# Patient Record
Sex: Male | Born: 1935 | Race: White | Hispanic: No | Smoking: Former smoker
Health system: Southern US, Community
[De-identification: ages and names within clinical notes are randomized; demographics above are authoritative.]

## PROBLEM LIST (undated history)

## (undated) DIAGNOSIS — H3530 Unspecified macular degeneration: Secondary | ICD-10-CM

## (undated) DIAGNOSIS — Z950 Presence of cardiac pacemaker: Secondary | ICD-10-CM

## (undated) DIAGNOSIS — G4733 Obstructive sleep apnea (adult) (pediatric): Secondary | ICD-10-CM

## (undated) DIAGNOSIS — K274 Chronic or unspecified peptic ulcer, site unspecified, with hemorrhage: Secondary | ICD-10-CM

## (undated) DIAGNOSIS — E538 Deficiency of other specified B group vitamins: Secondary | ICD-10-CM

## (undated) DIAGNOSIS — I480 Paroxysmal atrial fibrillation: Secondary | ICD-10-CM

## (undated) DIAGNOSIS — R269 Unspecified abnormalities of gait and mobility: Secondary | ICD-10-CM

## (undated) DIAGNOSIS — K2970 Gastritis, unspecified, without bleeding: Secondary | ICD-10-CM

## (undated) DIAGNOSIS — G629 Polyneuropathy, unspecified: Secondary | ICD-10-CM

## (undated) DIAGNOSIS — I499 Cardiac arrhythmia, unspecified: Secondary | ICD-10-CM

## (undated) DIAGNOSIS — M1990 Unspecified osteoarthritis, unspecified site: Secondary | ICD-10-CM

## (undated) DIAGNOSIS — K284 Chronic or unspecified gastrojejunal ulcer with hemorrhage: Secondary | ICD-10-CM

## (undated) DIAGNOSIS — N1330 Unspecified hydronephrosis: Secondary | ICD-10-CM

## (undated) DIAGNOSIS — R413 Other amnesia: Secondary | ICD-10-CM

## (undated) DIAGNOSIS — I8390 Asymptomatic varicose veins of unspecified lower extremity: Secondary | ICD-10-CM

## (undated) DIAGNOSIS — N210 Calculus in bladder: Secondary | ICD-10-CM

## (undated) DIAGNOSIS — I495 Sick sinus syndrome: Secondary | ICD-10-CM

## (undated) DIAGNOSIS — S2249XA Multiple fractures of ribs, unspecified side, initial encounter for closed fracture: Secondary | ICD-10-CM

## (undated) DIAGNOSIS — G250 Essential tremor: Secondary | ICD-10-CM

## (undated) DIAGNOSIS — I10 Essential (primary) hypertension: Secondary | ICD-10-CM

## (undated) HISTORY — DX: Obstructive sleep apnea (adult) (pediatric): G47.33

## (undated) HISTORY — DX: Deficiency of other specified B group vitamins: E53.8

## (undated) HISTORY — DX: Chronic or unspecified peptic ulcer, site unspecified, with hemorrhage: K27.4

## (undated) HISTORY — PX: COLON SURGERY: SHX602

## (undated) HISTORY — DX: Unspecified macular degeneration: H35.30

## (undated) HISTORY — DX: Other amnesia: R41.3

## (undated) HISTORY — PX: INSERT / REPLACE / REMOVE PACEMAKER: SUR710

## (undated) HISTORY — DX: Chronic or unspecified gastrojejunal ulcer with hemorrhage: K28.4

## (undated) HISTORY — PX: BRAIN SURGERY: SHX531

## (undated) HISTORY — DX: Unspecified abnormalities of gait and mobility: R26.9

## (undated) HISTORY — DX: Asymptomatic varicose veins of unspecified lower extremity: I83.90

---

## 1990-06-17 HISTORY — PX: OTHER SURGICAL HISTORY: SHX169

## 1998-09-11 ENCOUNTER — Ambulatory Visit (HOSPITAL_BASED_OUTPATIENT_CLINIC_OR_DEPARTMENT_OTHER): Admission: RE | Admit: 1998-09-11 | Discharge: 1998-09-11 | Payer: Self-pay | Admitting: Urology

## 2001-01-24 ENCOUNTER — Emergency Department (HOSPITAL_COMMUNITY): Admission: EM | Admit: 2001-01-24 | Discharge: 2001-01-24 | Payer: Self-pay | Admitting: Emergency Medicine

## 2001-11-10 ENCOUNTER — Ambulatory Visit: Admission: RE | Admit: 2001-11-10 | Discharge: 2001-11-10 | Payer: Self-pay | Admitting: Family Medicine

## 2002-03-07 ENCOUNTER — Emergency Department (HOSPITAL_COMMUNITY): Admission: EM | Admit: 2002-03-07 | Discharge: 2002-03-07 | Payer: Self-pay | Admitting: Emergency Medicine

## 2002-07-07 ENCOUNTER — Encounter: Payer: Self-pay | Admitting: Family Medicine

## 2002-07-07 ENCOUNTER — Ambulatory Visit (HOSPITAL_COMMUNITY): Admission: RE | Admit: 2002-07-07 | Discharge: 2002-07-07 | Payer: Self-pay | Admitting: Family Medicine

## 2002-08-16 ENCOUNTER — Encounter: Payer: Self-pay | Admitting: Family Medicine

## 2002-08-16 ENCOUNTER — Ambulatory Visit (HOSPITAL_COMMUNITY): Admission: RE | Admit: 2002-08-16 | Discharge: 2002-08-16 | Payer: Self-pay | Admitting: Family Medicine

## 2002-11-25 ENCOUNTER — Ambulatory Visit (HOSPITAL_COMMUNITY): Admission: RE | Admit: 2002-11-25 | Discharge: 2002-11-25 | Payer: Self-pay | Admitting: *Deleted

## 2003-05-02 ENCOUNTER — Ambulatory Visit (HOSPITAL_COMMUNITY): Admission: RE | Admit: 2003-05-02 | Discharge: 2003-05-02 | Payer: Self-pay | Admitting: Cardiovascular Disease

## 2003-05-09 ENCOUNTER — Other Ambulatory Visit: Admission: RE | Admit: 2003-05-09 | Discharge: 2003-05-09 | Payer: Self-pay | Admitting: Dermatology

## 2004-06-17 HISTORY — PX: PACEMAKER INSERTION: SHX728

## 2004-12-26 ENCOUNTER — Ambulatory Visit (HOSPITAL_COMMUNITY): Admission: RE | Admit: 2004-12-26 | Discharge: 2004-12-26 | Payer: Self-pay | Admitting: Family Medicine

## 2005-01-28 ENCOUNTER — Inpatient Hospital Stay (HOSPITAL_COMMUNITY): Admission: AD | Admit: 2005-01-28 | Discharge: 2005-01-30 | Payer: Self-pay | Admitting: *Deleted

## 2005-03-14 ENCOUNTER — Other Ambulatory Visit: Admission: RE | Admit: 2005-03-14 | Discharge: 2005-03-14 | Payer: Self-pay | Admitting: Dermatology

## 2005-06-05 ENCOUNTER — Ambulatory Visit (HOSPITAL_COMMUNITY): Admission: RE | Admit: 2005-06-05 | Discharge: 2005-06-05 | Payer: Self-pay | Admitting: Family Medicine

## 2006-04-16 ENCOUNTER — Ambulatory Visit (HOSPITAL_COMMUNITY): Admission: RE | Admit: 2006-04-16 | Discharge: 2006-04-16 | Payer: Self-pay | Admitting: Family Medicine

## 2006-05-06 ENCOUNTER — Ambulatory Visit (HOSPITAL_COMMUNITY): Admission: RE | Admit: 2006-05-06 | Discharge: 2006-05-06 | Payer: Self-pay | Admitting: Family Medicine

## 2006-05-15 ENCOUNTER — Ambulatory Visit (HOSPITAL_COMMUNITY): Admission: RE | Admit: 2006-05-15 | Discharge: 2006-05-15 | Payer: Self-pay | Admitting: Family Medicine

## 2007-03-27 ENCOUNTER — Ambulatory Visit (HOSPITAL_COMMUNITY): Admission: RE | Admit: 2007-03-27 | Discharge: 2007-03-27 | Payer: Self-pay | Admitting: Neurology

## 2008-05-07 ENCOUNTER — Emergency Department (HOSPITAL_COMMUNITY): Admission: EM | Admit: 2008-05-07 | Discharge: 2008-05-07 | Payer: Self-pay | Admitting: Emergency Medicine

## 2008-08-05 ENCOUNTER — Ambulatory Visit (HOSPITAL_COMMUNITY): Admission: RE | Admit: 2008-08-05 | Discharge: 2008-08-05 | Payer: Self-pay | Admitting: Cardiology

## 2008-11-26 ENCOUNTER — Emergency Department (HOSPITAL_COMMUNITY): Admission: EM | Admit: 2008-11-26 | Discharge: 2008-11-26 | Payer: Self-pay | Admitting: Emergency Medicine

## 2008-12-02 ENCOUNTER — Emergency Department (HOSPITAL_COMMUNITY): Admission: EM | Admit: 2008-12-02 | Discharge: 2008-12-02 | Payer: Self-pay | Admitting: Emergency Medicine

## 2009-03-02 ENCOUNTER — Emergency Department (HOSPITAL_COMMUNITY): Admission: EM | Admit: 2009-03-02 | Discharge: 2009-03-02 | Payer: Self-pay | Admitting: Emergency Medicine

## 2009-08-22 ENCOUNTER — Ambulatory Visit (HOSPITAL_COMMUNITY): Admission: RE | Admit: 2009-08-22 | Discharge: 2009-08-22 | Payer: Self-pay | Admitting: Family Medicine

## 2010-11-02 NOTE — Discharge Summary (Signed)
NAME:  Jon [name], Jon [name] NO.:  [id number]   MEDICAL RECORD NO.:  [id number]          PATIENT TYPE:  INP   LOCATION:  2019                         FACILITY:  [facility]   PHYSICIAN:  Darlin Priestly, MD  DATE OF BIRTH:  [birth year]   DATE OF ADMISSION:  01/28/2005  DATE OF DISCHARGE:  01/30/2005                                 DISCHARGE SUMMARY   DISCHARGE DIAGNOSES:  1.  Symptomatic bradycardia, status post permanent pacemaker implantation      with Medtronic in-rhythm, P1501DR, serial [id number].  2.  Paroxysmal atrial fibrillation.  3.  Tachy-brady syndrome.   DISCHARGE CONDITION:  Improved.   PROCEDURES:  On January 29, 2005, placement of dual-chamber permanent  pacemaker by Dr. Lenise Herald.   DISCHARGE MEDICATIONS:  1.  Coumadin 2.5 mg daily, begin January 31, 2005.  2.  Diovan 80 mg daily.  3.  Amiodarone 200 mg one-half daily.  4.  AcipHex 20 mg one daily.  5.  Zocor 20 mg every evening.  6.  Furosemide 40 mg p.r.n.  7.  Potassium 20 mEq p.r.n. to take with Lasix.   DISCHARGE INSTRUCTIONS:  1.  Low-fat, low-salt diet.  2.  Increase activity slowly, see pacemaker instruction sheet.  3.  Follow up with Dr. Jenne Campus on Friday, February 08, 2005, at 10 a.m.  4.  Have blood drawn on Monday morning of the week of discharge to check      INR.   HISTORY OF PRESENT ILLNESS:  A 77 year old obese male with history of PAF  which has been effectively treated with amiodarone and a longstanding  history of bradycardia. Dr. Allyson Sabal has attempted to modify his symptoms by  discontinuing his Cardizem to dose modify his amiodarone therapy. The  patient continued to complain of fatigue, exercise intolerance, easy  fatigability, interferes with lifestyle and ability to perform activities of  daily living. Even his usual walks have been something he cannot do or enjoy  secondary to fatigue or mild shortness of breath. He does not have syncope  or presyncope, just merely  fatigue. It had increased over the prior weeks  prior to seeing Dr. Severiano Gilbert, EP physician, in consult in July.   PAST MEDICAL HISTORY:  1.  Atherosclerotic coronary disease.  2.  Paroxysmal atrial fibrillation.  3.  Dyslipidemia.  4.  Essential hypertension.   For family history, social history, and review of systems, see H&P.   PHYSICAL EXAMINATION:  VITAL SIGNS: At discharge, blood pressure 132/84,  pulse 62, respirations 20, temperature 96.7.  GENERAL: Alert and oriented white male.  HEART: Regular rate and rhythm. S1 and S2.  CHEST: Clear to auscultation bilaterally. Pacing site without hematoma and  Steri-Strips are in place.  EXTREMITIES: Without edema.   LABORATORY DATA:  Hemoglobin 13.7 after procedure. Hematocrit 40, ____,  platelet count 220,000. Pro time on admission was 24 with an INR of 2.2 and  then INR dropped to 1.9. Prior to discharge it was 1.5. Sodium was 140,  potassium 4.0, chloride 107, CO2 28, glucose 123, BUN 15, creatinine 1.4,  calcium 8.4, total protein 12.5, albumin 3.4, AST 21,  ALT 16, ALP 56, total  bilirubin 0.7. Total cholesterol 168, triglycerides 194, HDL 33, LDL 96.   EKG on August 16th was normal sinus rhythm with no previous change from  previous tracing. Chest x-ray on August 14th showed no evidence of acute  disease.   Follow-up chest x-ray revealed left-sided pacemaker, no pneumo, no active  disease.   HOSPITAL COURSE:  Mr. Dinkins was admitted on January 28, 2005, for  Coumadin/heparin crossover for permanent pacemaker. His INR was not yet less  than 2. No heparin was started and the patient drifted downward. By the  morning of January 29, 2005, he was stable and ready for pacemaker insertion.  This was done by Dr. Jenne Campus. The patient tolerated the procedure well.   On January 30, 2005, pacer was interrogated, normal function, no changes were  done, and the patient was felt stable to be discharged home. He was  ambulating without problems  with follow up with Dr. Jenne Campus.      Darcella Gasman. Valarie Merino      Darlin Priestly, MD  Electronically Signed    LRI/MEDQ  D:  03/24/2005  T:  03/24/2005  Job:  [id number]   cc:   Nanetta Batty, M.D.  Fax: [phone]   Mila Homer. Sudie Bailey, M.D.  Fax: [phone]

## 2010-11-02 NOTE — Op Note (Signed)
NAME:  THERRON, SELLS NO.:  [id number]   MEDICAL RECORD NO.:  [id number]          PATIENT TYPE:  INP   LOCATION:  2019                         FACILITY:  [facility]   PHYSICIAN:  Darlin Priestly, MD  DATE OF BIRTH:  10-26-1931   DATE OF PROCEDURE:  01/29/2005  DATE OF DISCHARGE:                                 OPERATIVE REPORT   PROCEDURE:  Insertion of a Medtronic EnRhythm pacemaker with passive atrial  and ventricular leads.   ATTENDING:  Dr. Lenise Herald.   COMPLICATIONS:  None.   INDICATION:  Mr. Stonehocker is a 77 year old male, a patient of Dr. Nanetta Batty, with a history of paroxysmal atrial fibrillation, on amiodarone  therapy, with a history of sick sinus syndrome and intermittent bradycardia.  Because of his PAF and difficulty controlling the heart rate, he is now  referred for dual chamber pacer implant.   DESCRIPTION OF OPERATION:  After giving informed written consent, the  patient was brought to the Cardiac Cath Lab where his left anterior chest  wall was shaved, prepped and draped in sterile fashion.  The ECD monitor  established.  Lidocaine 1% was then used to anesthetize the left subclavian  region in the midclavicular line.  Next, approximately a 3 cm horizontal mid  infraclavicular incision was performed and hemostasis was obtained with  electrocautery.  Blunt dissection was used to carry this down to the left  pectoral fascia.  Next, an approximately 3 x 4 pocket was then created over  the left pectoral fascia and again hemostasis was obtained with  electrocautery.  The left subclavian vein was then entered using fluoro  visualization and a guide wire was then easily passed into the SVC and right  atrium.  Four silk sutures were then placed at the base of the subclavian  stick for lead anchoring.  Next, a #9 [place] dilator sheath was then easily  tracked over the retained guide wire and the dilator and guide wire were  removed.   Following this, a 58 cm passive ventricular Medtronic lead, Model  #4092, Serial [id number], was then easily tracked into the right atrium.  The guide wire was retained and the peel-away sheath was removed.  A second  9 French dilator and sheath were then tracked over the retained guide wire  and the guide wire and dilator were removed.  A 53 cm passive Medtronic  lead, Model #5594, Serial [id number], was then easily passed into the right  atrium, the guide wire was retained, the sheath was then peeled away.  The  guide wire was then passed into the sheath.  A __________ was then placed on  the ventricular lead stylet and the ventricular lead was allowed to prolapse  through the tricuspid valve and position in the RV apex and thresholds were  determined.  RV waves were measured at 9.5 millivolts.  The impedance was  557 ohms.  Threshold was .6 volts at .5 milliseconds.  Current was 1.4  milliamps.  The atrial lead stylet was then pulled back and the atrial lead  was then positioned in the right atrial appendage.  Threshold was then  determined.  P waves were measured at 6.9 millivolts.  Threshold in the  atrium was .6 volts at .5 milliseconds.  Impedance was 473 ohms.  Current  was 1.5 milliamps.  Ten volts was negative in both the A and the V lead.  The silk sutures were then secured to both leads.  The pocket was then  copiously irrigated with 1% kanamycin solution.  Again, hemostasis was  confirmed.  The leads were then connected in a serial fashion to a Medtronic  EnRhythm P1501DR generator, Serial [id number].  Head screws were tightened  and pacing was confirmed.  A single silk suture was then placed in the apex  of the pocket.  The leads and generator were then delivered into the pocket  and the header was then secured to the silk suture.  The pocket subcutaneous  layer was then closed using running 2-0 Vicryl.  The skin layer was then  closed using 4-0 Vicryl.  Steri-Strip was then  applied.  The patient was  transferred to the recovery room in stable condition.   CONCLUSIONS:  Successful implant of a Medtronic EnRhythm [id number] generator,  Serial [id number] with passive atrial and ventricular leads.      Darlin Priestly, MD  Electronically Signed     RHM/MEDQ  D:  01/29/2005  T:  01/29/2005  Job:  [id number]   cc:   Nanetta Batty, M.D.  Fax: [phone]

## 2010-11-02 NOTE — Procedures (Signed)
NAME:  Jon [name], Jon [name] NO.:  [id number]   MEDICAL RECORD NO.:  [id number]          PATIENT TYPE:  OUT   LOCATION:  RESP                          FACILITY:  [facility]   PHYSICIAN:  Edward L. Juanetta Gosling, M.D.DATE OF BIRTH:  [birth year]   DATE OF PROCEDURE:  DATE OF DISCHARGE:  05/15/2006                            PULMONARY FUNCTION TEST   FINDINGS:  1. Spirometry shows minimal airflow obstruction at the level of the      smaller airways which is consistent with a history of smoking.  2. Lung volumes are normal.  3. DLCO is normal.      Edward L. Juanetta Gosling, M.D.  Electronically Signed     ELH/MEDQ  D:  05/18/2006  T:  05/19/2006  Job:  [id number]   cc:   Mila Homer. Sudie Bailey, M.D.  Fax: [phone]

## 2010-11-02 NOTE — Procedures (Signed)
NAME:  Jon [name], Jon [name]                      ACCOUNT NO.:  [id number]   MEDICAL RECORD NO.:  [id number]                   PATIENT TYPE:  OUT   LOCATION:  RAD                                  FACILITY:  [facility]   PHYSICIAN:  Edward L. Juanetta Gosling, M.D.             DATE OF BIRTH:  05/07/1932   DATE OF PROCEDURE:  DATE OF DISCHARGE:  05/02/2003                              PULMONARY FUNCTION TEST   FINDINGS:  1. Spirometry shows a mild ventilatory defect without definite air-flow     obstruction.  2. Lung volumes are slightly decreased the same order of magnitude as his     ventilatory defect.  3. DLCO also slightly decreased at about the same magnitude as the     ventilatory defect.      ___________________________________________                                            Oneal Deputy. Juanetta Gosling, M.D.   ELH/MEDQ  D:  05/09/2003  T:  05/10/2003  Job:  [id number]

## 2011-06-18 HISTORY — PX: CATARACT EXTRACTION: SUR2

## 2012-08-05 ENCOUNTER — Other Ambulatory Visit: Payer: Self-pay | Admitting: Cardiovascular Disease

## 2012-08-05 ENCOUNTER — Encounter (HOSPITAL_COMMUNITY): Payer: Self-pay | Admitting: Pharmacist

## 2012-08-12 MED ORDER — DEXTROSE 5 % IV SOLN
3.0000 g | INTRAVENOUS | Status: AC
  Filled 2012-08-12: qty 3000

## 2012-08-12 MED ORDER — SODIUM CHLORIDE 0.9 % IV SOLN
250.0000 mL | INTRAVENOUS | Status: DC
  Administered 2012-08-13: 12:00:00 via INTRAVENOUS

## 2012-08-12 MED ORDER — SODIUM CHLORIDE 0.9 % IR SOLN
80.0000 mg | Status: AC
  Filled 2012-08-12: qty 2

## 2012-08-12 MED ORDER — SODIUM CHLORIDE 0.45 % IV SOLN: INTRAVENOUS | Status: DC

## 2012-08-13 ENCOUNTER — Encounter (HOSPITAL_COMMUNITY)
Admission: RE | Disposition: A | Discharge status: Home / Self Care | Payer: Self-pay | Source: Ambulatory Visit | Attending: Cardiovascular Disease

## 2012-08-13 ENCOUNTER — Ambulatory Visit (HOSPITAL_COMMUNITY): Payer: Medicare Other

## 2012-08-13 ENCOUNTER — Ambulatory Visit (HOSPITAL_COMMUNITY)
Admission: RE | Admit: 2012-08-13 | Discharge: 2012-08-13 | Disposition: A | Discharge status: Home / Self Care | Payer: Medicare Other | Source: Ambulatory Visit | Attending: Cardiovascular Disease | Admitting: Cardiovascular Disease

## 2012-08-13 DIAGNOSIS — Z45018 Encounter for adjustment and management of other part of cardiac pacemaker: Secondary | ICD-10-CM | POA: Insufficient documentation

## 2012-08-13 HISTORY — PX: PERMANENT PACEMAKER GENERATOR CHANGE: SHX6022

## 2012-08-13 LAB — SURGICAL PCR SCREEN
MRSA, PCR: NEGATIVE
Staphylococcus aureus: NEGATIVE

## 2012-08-13 LAB — PROTIME-INR: Prothrombin Time: 21.3 seconds — ABNORMAL HIGH (ref 11.6–15.2)

## 2012-08-13 SURGERY — PERMANENT PACEMAKER GENERATOR CHANGE
Anesthesia: LOCAL

## 2012-08-13 MED ORDER — MUPIROCIN 2 % EX OINT
TOPICAL_OINTMENT | Freq: Two times a day (BID) | CUTANEOUS | Status: DC
  Filled 2012-08-13: qty 22

## 2012-08-13 MED ORDER — MUPIROCIN 2 % EX OINT
TOPICAL_OINTMENT | CUTANEOUS | Status: AC
  Filled 2012-08-13: qty 22

## 2012-08-13 MED ORDER — MIDAZOLAM HCL 2 MG/2ML IJ SOLN
INTRAMUSCULAR | Status: AC
  Filled 2012-08-13: qty 2

## 2012-08-13 MED ORDER — SODIUM CHLORIDE 0.9 % IV SOLN: INTRAVENOUS | Status: DC

## 2012-08-13 MED ORDER — FENTANYL CITRATE 0.05 MG/ML IJ SOLN
INTRAMUSCULAR | Status: AC
  Filled 2012-08-13: qty 2

## 2012-08-13 MED ORDER — ONDANSETRON HCL 4 MG/2ML IJ SOLN: 4.0000 mg | Freq: Four times a day (QID) | INTRAMUSCULAR | Status: DC | PRN

## 2012-08-13 MED ORDER — LIDOCAINE HCL (PF) 1 % IJ SOLN
INTRAMUSCULAR | Status: AC
  Filled 2012-08-13: qty 60

## 2012-08-13 MED ORDER — ACETAMINOPHEN 325 MG PO TABS
325.0000 mg | ORAL_TABLET | ORAL | Status: DC | PRN
  Filled 2012-08-13: qty 2

## 2012-08-13 NOTE — H&P (Signed)
Date of Initial H&P:   History reviewed, patient examined, no change in status, stable for surgery. Pacemaker generator at [facility], here for generator change. This procedure has been fully reviewed with the patient and written informed consent has been obtained. Thurmon Fair, MD, [name] and [facility] Center [phone] office [phone] pager

## 2012-08-13 NOTE — CV Procedure (Signed)
Jon [name], Robling Male, 79 y.o., [birth year]  MRN: [id number]  CSN: [id number]  Admit Dt: 08/13/12  Procedure report  Procedure performed:  1. Dual chamber pacemaker generator changeout  2. Light sedation  Reason for procedure:  1. Device generator at elective replacement interval  Procedure performed by:  Thurmon Fair, MD  Complications:  None  Estimated blood loss:  <5 mL  Medications administered during procedure:  Ancef 3 g intravenously,  lidocaine 1% 30 mL locally, fentanyl 25 mcg intravenously, Versed 1 mg intravenously Device details:   New Generator Medtronic Adapta model number ADDRL1, serial number [id number] Right atrial lead (chronic) Medtronic, model number G8843662, serial [id number] (implanted 01/29/2005) Right ventricular lead (chronic)  Medtronic, model number [id number], serial number [id number] (implanted 01/30/2015)  Explanted generator Medtronic EnRhythm ,  model number P1501DR, serial number  [id number] (implanted 01/30/2015)  Procedure details:  After the risks and benefits of the procedure were discussed the patient provided informed consent. She was brought to the cardiac catheter lab in the fasting state. The patient was prepped and draped in usual sterile fashion. Local anesthesia with 1% lidocaine was administered to to the left infraclavicular area. A 5-6cm horizontal incision was made parallel with and 2-3 cm caudal to the left clavicle, in the area of an old scar.  Using minimal electrocautery and mostly sharp and blunt dissection the prepectoral pocket was opened carefully to avoid injury to the loops of chronic leads. Extensive dissection was not necessary. The device was explanted. The pocket was carefully inspected for hemostasis and flushed with copious amounts of antibiotic solution.  The leads were disconnected from the old generator and testing of the lead parameters later showed excellent values. The new generator was connected to the chronic  leads, with appropriate pacing noted.   The entire system was then carefully inserted in the pocket with care been taking that the leads and device assumed a comfortable position without pressure on the incision. Great care was taken that the leads be located deep to the generator. The pocket was then closed in layers using 2 layers of 2-0 Vicryl and cutaneous staples after which a sterile dressing was applied.   At the end of the procedure the following lead parameters were encountered:   Right atrial lead sensed P waves 5.2 mV, impedance 517 ohms, threshold 1.7 at 0.5 ms pulse width.  Right ventricular lead sensed R waves  8.8 mV, impedance 360 ohms, threshold 0.8 at 0.5 ms pulse width.   Thurmon Fair, MD, [facility] and Vascular Center [phone] office [phone] pager 08/13/2012 3:10 PM

## 2012-09-07 ENCOUNTER — Ambulatory Visit: Payer: Self-pay | Admitting: Cardiovascular Disease

## 2012-09-07 DIAGNOSIS — Z7901 Long term (current) use of anticoagulants: Secondary | ICD-10-CM | POA: Insufficient documentation

## 2012-09-07 DIAGNOSIS — I4891 Unspecified atrial fibrillation: Secondary | ICD-10-CM

## 2012-10-05 ENCOUNTER — Encounter: Payer: Self-pay | Admitting: *Deleted

## 2012-11-06 ENCOUNTER — Other Ambulatory Visit: Payer: Self-pay | Admitting: Cardiovascular Disease

## 2012-11-06 LAB — PROTIME-INR: INR: 2.37 — ABNORMAL HIGH (ref <1.50)

## 2012-11-10 ENCOUNTER — Ambulatory Visit (INDEPENDENT_AMBULATORY_CARE_PROVIDER_SITE_OTHER): Payer: Self-pay | Admitting: Pharmacist Clinician (PhC)/ Clinical Pharmacy Specialist

## 2012-11-10 DIAGNOSIS — Z7901 Long term (current) use of anticoagulants: Secondary | ICD-10-CM

## 2012-11-10 DIAGNOSIS — I4891 Unspecified atrial fibrillation: Secondary | ICD-10-CM

## 2012-11-19 ENCOUNTER — Other Ambulatory Visit: Payer: Self-pay | Admitting: Pharmacist Clinician (PhC)/ Clinical Pharmacy Specialist

## 2012-11-19 MED ORDER — WARFARIN SODIUM 2.5 MG PO TABS: ORAL_TABLET | ORAL | Status: DC

## 2012-11-25 ENCOUNTER — Telehealth: Payer: Self-pay | Admitting: Neurology

## 2012-11-25 NOTE — Telephone Encounter (Signed)
Called pt to let him know working on reassignment. Pt agreed.

## 2012-11-29 ENCOUNTER — Other Ambulatory Visit: Payer: Self-pay | Admitting: Cardiovascular Disease

## 2012-11-29 DIAGNOSIS — I4891 Unspecified atrial fibrillation: Secondary | ICD-10-CM

## 2012-11-29 DIAGNOSIS — I495 Sick sinus syndrome: Secondary | ICD-10-CM

## 2012-11-29 LAB — PACEMAKER DEVICE OBSERVATION

## 2012-12-01 ENCOUNTER — Encounter: Payer: Self-pay | Admitting: *Deleted

## 2012-12-01 LAB — REMOTE PACEMAKER DEVICE
AL THRESHOLD: 1.75 V
BAMS-0001: 150 {beats}/min
RV LEAD AMPLITUDE: 5.6 mv
RV LEAD THRESHOLD: 0.625 V

## 2012-12-03 ENCOUNTER — Other Ambulatory Visit: Payer: Self-pay | Admitting: Cardiovascular Disease

## 2012-12-03 LAB — PROTIME-INR: Prothrombin Time: 19.6 seconds — ABNORMAL HIGH (ref 11.6–15.2)

## 2012-12-04 ENCOUNTER — Ambulatory Visit (INDEPENDENT_AMBULATORY_CARE_PROVIDER_SITE_OTHER): Payer: Self-pay | Admitting: Pharmacist Clinician (PhC)/ Clinical Pharmacy Specialist

## 2012-12-04 ENCOUNTER — Encounter: Payer: Self-pay | Admitting: *Deleted

## 2012-12-04 ENCOUNTER — Encounter: Payer: Self-pay | Admitting: Cardiovascular Disease

## 2012-12-04 DIAGNOSIS — I4891 Unspecified atrial fibrillation: Secondary | ICD-10-CM

## 2012-12-04 DIAGNOSIS — Z7901 Long term (current) use of anticoagulants: Secondary | ICD-10-CM

## 2012-12-07 ENCOUNTER — Telehealth: Payer: Self-pay | Admitting: Neurology

## 2012-12-17 ENCOUNTER — Other Ambulatory Visit: Payer: Self-pay | Admitting: *Deleted

## 2012-12-17 MED ORDER — DILTIAZEM HCL ER COATED BEADS 120 MG PO CP24: 120.0000 mg | ORAL_CAPSULE | Freq: Every day | ORAL | Status: DC

## 2012-12-24 ENCOUNTER — Other Ambulatory Visit: Payer: Self-pay | Admitting: Cardiovascular Disease

## 2012-12-25 ENCOUNTER — Ambulatory Visit (INDEPENDENT_AMBULATORY_CARE_PROVIDER_SITE_OTHER): Payer: Self-pay | Admitting: Pharmacist Clinician (PhC)/ Clinical Pharmacy Specialist

## 2012-12-25 DIAGNOSIS — Z7901 Long term (current) use of anticoagulants: Secondary | ICD-10-CM

## 2012-12-25 DIAGNOSIS — I4891 Unspecified atrial fibrillation: Secondary | ICD-10-CM

## 2013-01-04 ENCOUNTER — Other Ambulatory Visit: Payer: Self-pay | Admitting: *Deleted

## 2013-01-04 ENCOUNTER — Telehealth: Payer: Self-pay | Admitting: Cardiovascular Disease

## 2013-01-04 MED ORDER — SOTALOL HCL 160 MG PO TABS: 320.0000 mg | ORAL_TABLET | Freq: Two times a day (BID) | ORAL | Status: DC

## 2013-01-04 MED ORDER — SOTALOL HCL 160 MG PO TABS: 160.0000 mg | ORAL_TABLET | Freq: Two times a day (BID) | ORAL | Status: DC

## 2013-01-04 NOTE — Telephone Encounter (Signed)
I did not find any documentation in the system confirming the increase in the dose of the sotalol.  I think that it was entered in error.  The correct dose of the sotalol should be 160mg  BID.  I spoke with Lorin Picket and corrected the dose in Epic.

## 2013-01-04 NOTE — Telephone Encounter (Signed)
Please call [name] back about a prescription that was written from Dr. Allyson Sabal.

## 2013-01-09 ENCOUNTER — Other Ambulatory Visit: Payer: Self-pay | Admitting: Cardiovascular Disease

## 2013-01-11 NOTE — Telephone Encounter (Signed)
Rx was sent to pharmacy electronically. 

## 2013-01-25 ENCOUNTER — Other Ambulatory Visit: Payer: Self-pay | Admitting: Cardiovascular Disease

## 2013-01-26 LAB — PROTIME-INR
INR: 2.08 — ABNORMAL HIGH (ref <1.50)
Prothrombin Time: 22.7 seconds — ABNORMAL HIGH (ref 11.6–15.2)

## 2013-01-27 ENCOUNTER — Ambulatory Visit (INDEPENDENT_AMBULATORY_CARE_PROVIDER_SITE_OTHER): Payer: Self-pay | Admitting: Pharmacist Clinician (PhC)/ Clinical Pharmacy Specialist

## 2013-01-27 DIAGNOSIS — I4891 Unspecified atrial fibrillation: Secondary | ICD-10-CM

## 2013-01-27 DIAGNOSIS — Z7901 Long term (current) use of anticoagulants: Secondary | ICD-10-CM

## 2013-02-23 ENCOUNTER — Other Ambulatory Visit: Payer: Self-pay | Admitting: Cardiovascular Disease

## 2013-02-23 LAB — PROTIME-INR: INR: 2.18 — ABNORMAL HIGH (ref <1.50)

## 2013-02-25 ENCOUNTER — Ambulatory Visit (INDEPENDENT_AMBULATORY_CARE_PROVIDER_SITE_OTHER): Payer: Medicare Other | Admitting: Pharmacist Clinician (PhC)/ Clinical Pharmacy Specialist

## 2013-02-25 DIAGNOSIS — Z7901 Long term (current) use of anticoagulants: Secondary | ICD-10-CM

## 2013-02-25 DIAGNOSIS — I4891 Unspecified atrial fibrillation: Secondary | ICD-10-CM

## 2013-03-02 ENCOUNTER — Ambulatory Visit: Payer: BLUE CROSS/BLUE SHIELD

## 2013-03-02 DIAGNOSIS — I4891 Unspecified atrial fibrillation: Secondary | ICD-10-CM

## 2013-03-02 DIAGNOSIS — I495 Sick sinus syndrome: Secondary | ICD-10-CM

## 2013-03-02 LAB — PACEMAKER DEVICE OBSERVATION

## 2013-03-04 ENCOUNTER — Encounter: Payer: Self-pay | Admitting: *Deleted

## 2013-03-04 LAB — REMOTE PACEMAKER DEVICE
AL IMPEDENCE PM: 401 Ohm
AL THRESHOLD: 1.625 V
BATTERY VOLTAGE: 2.79 V

## 2013-03-29 ENCOUNTER — Other Ambulatory Visit: Payer: Self-pay | Admitting: Cardiovascular Disease

## 2013-03-30 ENCOUNTER — Ambulatory Visit (INDEPENDENT_AMBULATORY_CARE_PROVIDER_SITE_OTHER): Payer: Medicare Other | Admitting: Pharmacist Clinician (PhC)/ Clinical Pharmacy Specialist

## 2013-03-30 DIAGNOSIS — Z7901 Long term (current) use of anticoagulants: Secondary | ICD-10-CM

## 2013-03-30 DIAGNOSIS — I4891 Unspecified atrial fibrillation: Secondary | ICD-10-CM

## 2013-03-30 LAB — PROTIME-INR: INR: 1.78 — ABNORMAL HIGH (ref <1.50)

## 2013-04-08 ENCOUNTER — Other Ambulatory Visit: Payer: Self-pay | Admitting: *Deleted

## 2013-04-08 MED ORDER — FUROSEMIDE 40 MG PO TABS: 40.0000 mg | ORAL_TABLET | Freq: Every day | ORAL | Status: DC | PRN

## 2013-04-08 MED ORDER — POTASSIUM CHLORIDE CRYS ER 10 MEQ PO TBCR: 10.0000 meq | EXTENDED_RELEASE_TABLET | Freq: Every day | ORAL | Status: DC | PRN

## 2013-04-08 NOTE — Telephone Encounter (Signed)
Rx was sent to pharmacy electronically. 

## 2013-04-29 ENCOUNTER — Other Ambulatory Visit: Payer: Self-pay | Admitting: Cardiovascular Disease

## 2013-04-30 ENCOUNTER — Ambulatory Visit (INDEPENDENT_AMBULATORY_CARE_PROVIDER_SITE_OTHER): Payer: Medicare Other | Admitting: Pharmacist Clinician (PhC)/ Clinical Pharmacy Specialist

## 2013-04-30 DIAGNOSIS — I4891 Unspecified atrial fibrillation: Secondary | ICD-10-CM

## 2013-04-30 DIAGNOSIS — Z7901 Long term (current) use of anticoagulants: Secondary | ICD-10-CM

## 2013-04-30 LAB — PROTIME-INR: Prothrombin Time: 25.3 seconds — ABNORMAL HIGH (ref 11.6–15.2)

## 2013-05-03 ENCOUNTER — Encounter: Payer: Self-pay | Admitting: Neurology

## 2013-05-03 ENCOUNTER — Ambulatory Visit (INDEPENDENT_AMBULATORY_CARE_PROVIDER_SITE_OTHER): Payer: Medicare Other | Admitting: Neurology

## 2013-05-03 VITALS — BP 149/97 | HR 108 | Wt 191.0 lb

## 2013-05-03 DIAGNOSIS — G250 Essential tremor: Secondary | ICD-10-CM

## 2013-05-03 DIAGNOSIS — R269 Unspecified abnormalities of gait and mobility: Secondary | ICD-10-CM | POA: Insufficient documentation

## 2013-05-03 DIAGNOSIS — G63 Polyneuropathy in diseases classified elsewhere: Secondary | ICD-10-CM | POA: Insufficient documentation

## 2013-05-03 NOTE — Progress Notes (Signed)
Reason for visit: Peripheral neuropathy  Jon [name] is an 79 y.o. male  History of present illness:  Jon [name] is a 79 year old right-handed white male with a history of a peripheral neuropathy, benign essential tremor, and a vitamin B12 deficiency. The patient reports some mild memory issues, but this has not been progressive over time. The patient remains on oral supplementation for vitamin B12. The patient has numbness in the feet, and he has developed a gait disorder that has been slowly progressive over time. The patient denies any pain in the feet, and he sleeps well at night. The patient has a history of sleep apnea, but he does not use CPAP. The patient usually sleeps in a recliner. The patient uses a cane for ambulation. The patient has most difficulty with balance on uneven ground. The patient last fell 4 years ago and fractured some ribs. The patient returns for an evaluation.  Past Medical History  Diagnosis Date  . Heart disease   . Atrial fibrillation     WITH PACEMAKER  . OSA (obstructive sleep apnea)   . Memory loss   . Bleeding ulcer   . B12 deficiency   . Hypertension   . Abnormality of gait 05/03/2013  . Essential and other specified forms of tremor 05/03/2013  . Polyneuropathy in other diseases classified elsewhere 05/03/2013  . Peripheral edema   . Varicose veins     Bilateral  . Rib fracture   . Macular degeneration of left eye     Past Surgical History  Procedure Laterality Date  . Pacemaker insertion  2006  . Meningioma removal  1992    POST RT FRONTAL   . Cataract extraction Bilateral 2013    Family History  Problem Relation Age of Onset  . Aortic aneurysm Mother   . Parkinsonism Mother   . Heart disease Father   . Cancer Sister     esaphageal cancer    Social history:  reports that he has quit smoking. His smoking use included Cigarettes. He smoked 0.00 packs per day. He has never used smokeless tobacco. He reports that he does  not drink alcohol or use illicit drugs.    Allergies  Allergen Reactions  . Iohexol      Desc: PT STATES THROAT/FACE SWELLING W/ IVP DYE IN 1990. PT HAS NOT HAD ANY EXAMS DONE W/DYE SINCE AND HAS NEVER BEEN PRE MEDICATED. PER PT, Onset Date: [id number]   . Dilantin [Phenytoin Sodium Extended] Rash  . Tegretol [Carbamazepine] Rash    Medications:  Current Outpatient Prescriptions on File Prior to Visit  Medication Sig Dispense Refill  . ALPRAZolam (XANAX) 1 MG tablet Take 1 mg by mouth at bedtime as needed for sleep.      Marland Kitchen diltiazem (CARDIZEM CD) 120 MG 24 hr capsule Take 1 capsule (120 mg total) by mouth daily.  30 capsule  6  . fexofenadine (ALLEGRA) 180 MG tablet Take 180 mg by mouth daily.      . furosemide (LASIX) 40 MG tablet Take 1 tablet (40 mg total) by mouth daily as needed (for swelling in legs).  30 tablet  4  . HYDROcodone-acetaminophen (NORCO) 10-325 MG per tablet Take 1 tablet by mouth every 6 (six) hours as needed for pain.      Marland Kitchen lisinopril (PRINIVIL,ZESTRIL) 20 MG tablet TAKE ONE TABLET BY MOUTH ONCE DAILY.  30 tablet  7  . omeprazole (PRILOSEC) 20 MG capsule Take 20 mg by mouth daily.      Marland Kitchen  Polyethyl Glycol-Propyl Glycol (SYSTANE OP) Place 1 drop into both eyes daily as needed (for dry eyes).      . potassium chloride (K-DUR,KLOR-CON) 10 MEQ tablet Take 1 tablet (10 mEq total) by mouth daily as needed (takes with lasix).  30 tablet  4  . predniSONE (DELTASONE) 5 MG tablet Take 5 mg by mouth daily as needed (for eczema on hand).      . sotalol (BETAPACE) 160 MG tablet Take 1 tablet (160 mg total) by mouth 2 (two) times daily.  60 tablet  6  . vitamin B-12 (CYANOCOBALAMIN) 1000 MCG tablet Take 1,000 mcg by mouth daily.      Marland Kitchen warfarin (COUMADIN) 2.5 MG tablet Take 1-2 tablets by mouth daily as directed  40 tablet  6   No current facility-administered medications on file prior to visit.    ROS:  Out of a complete 14 system review of symptoms, the patient complains  only of the following symptoms, and all other reviewed systems are negative.  Swelling in the legs, palpitations of the heart Joint pain  Visual loss  Allergies Memory loss, numbness, tremor  Blood pressure 149/97, pulse 108, weight 191 lb (86.637 kg).  Physical Exam  General: The patient is alert and cooperative at the time of the examination. The patient is mildly to moderately obese.  Skin: 3+ edema below the knees is noted, varicose veins are noted in both legs.   Neurologic Exam  Mental status: The Mini-Mental status examination done today shows a total score of 30/30.  Cranial nerves: Facial symmetry is present. Speech is normal, no aphasia or dysarthria is noted. Extraocular movements are full. Visual fields are full.  Motor: The patient has good strength in all 4 extremities.  Sensory examination: Soft touch sensation is symmetric on the face, arms, and legs. A stocking pinprick sensory deficit is noted up to the knees bilaterally.  Coordination: The patient has good finger-nose-finger and heel-to-shin bilaterally. A mild intention tremor is seen with finger-nose-finger bilaterally.  Gait and station: The patient has a slightly wide-based, unsteady gait. Tandem gait was not attempted. The patient walks with a cane. Romberg is negative. No drift is seen.  Reflexes: Deep tendon reflexes are symmetric, but are depressed.   Assessment/Plan:  One. Benign essential tremor  2. Peripheral neuropathy  3. Vitamin B12 deficiency  4. Gait disturbance  5. Memory disturbance  The patient is doing well at this point, but he has to engage in safety precautions for his balance issues. The patient uses a cane for ambulation, and he has not fallen in over 4 years. At this point, the patient will contact our office if needed for an evaluation. We are not prescribing any medications for this patient, and we are offering no therapies at this point.  Jon Palau MD 05/03/2013  8:50 PM  [facility] [address] [place], [place] [address]  Phone [phone] Fax [phone]

## 2013-05-03 NOTE — Patient Instructions (Signed)

## 2013-05-24 ENCOUNTER — Telehealth: Payer: Self-pay | Admitting: Pharmacist Clinician (PhC)/ Clinical Pharmacy Specialist

## 2013-05-24 DIAGNOSIS — I4891 Unspecified atrial fibrillation: Secondary | ICD-10-CM

## 2013-05-24 DIAGNOSIS — Z7901 Long term (current) use of anticoagulants: Secondary | ICD-10-CM

## 2013-05-24 NOTE — Telephone Encounter (Signed)
Standing lab order faxed to [facility]

## 2013-06-02 ENCOUNTER — Other Ambulatory Visit: Payer: Self-pay | Admitting: Cardiovascular Disease

## 2013-06-03 ENCOUNTER — Ambulatory Visit (INDEPENDENT_AMBULATORY_CARE_PROVIDER_SITE_OTHER): Payer: Medicare Other | Admitting: Pharmacist Clinician (PhC)/ Clinical Pharmacy Specialist

## 2013-06-03 DIAGNOSIS — Z7901 Long term (current) use of anticoagulants: Secondary | ICD-10-CM

## 2013-06-03 DIAGNOSIS — I4891 Unspecified atrial fibrillation: Secondary | ICD-10-CM

## 2013-06-03 LAB — PROTIME-INR: INR: 2.04 — ABNORMAL HIGH (ref <1.50)

## 2013-06-07 ENCOUNTER — Ambulatory Visit (INDEPENDENT_AMBULATORY_CARE_PROVIDER_SITE_OTHER): Payer: Medicare Other

## 2013-06-07 DIAGNOSIS — I4891 Unspecified atrial fibrillation: Secondary | ICD-10-CM

## 2013-06-07 LAB — PACEMAKER DEVICE OBSERVATION

## 2013-06-15 ENCOUNTER — Encounter: Payer: Self-pay | Admitting: *Deleted

## 2013-06-15 LAB — MDC_IDC_ENUM_SESS_TYPE_REMOTE
Battery Remaining Longevity: 9
Battery Voltage: 2.79 V
Lead Channel Pacing Threshold Amplitude: 1.125 V
Lead Channel Pacing Threshold Pulse Width: 0.4 ms
Lead Channel Setting Pacing Amplitude: 3.75 V
Lead Channel Setting Pacing Pulse Width: 0.4 ms
Lead Channel Setting Sensing Sensitivity: 2.8 mV

## 2013-06-29 ENCOUNTER — Ambulatory Visit: Payer: Self-pay | Admitting: Neurology

## 2013-07-08 ENCOUNTER — Other Ambulatory Visit: Payer: Self-pay | Admitting: Cardiovascular Disease

## 2013-07-09 ENCOUNTER — Ambulatory Visit (INDEPENDENT_AMBULATORY_CARE_PROVIDER_SITE_OTHER): Payer: Medicare Other | Admitting: Pharmacist Clinician (PhC)/ Clinical Pharmacy Specialist

## 2013-07-09 DIAGNOSIS — I4891 Unspecified atrial fibrillation: Secondary | ICD-10-CM

## 2013-07-09 DIAGNOSIS — Z7901 Long term (current) use of anticoagulants: Secondary | ICD-10-CM

## 2013-07-09 LAB — PROTIME-INR
INR: 1.82 — ABNORMAL HIGH (ref <1.50)
Prothrombin Time: 20.7 seconds — ABNORMAL HIGH (ref 11.6–15.2)

## 2013-07-19 ENCOUNTER — Other Ambulatory Visit: Payer: Self-pay | Admitting: Pharmacist Clinician (PhC)/ Clinical Pharmacy Specialist

## 2013-07-19 MED ORDER — WARFARIN SODIUM 2.5 MG PO TABS: ORAL_TABLET | ORAL | Status: DC

## 2013-07-20 ENCOUNTER — Other Ambulatory Visit: Payer: Self-pay

## 2013-07-20 MED ORDER — DILTIAZEM HCL ER COATED BEADS 120 MG PO CP24: 120.0000 mg | ORAL_CAPSULE | Freq: Every day | ORAL | Status: DC

## 2013-07-20 NOTE — Telephone Encounter (Signed)
Rx was sent to pharmacy electronically. 

## 2013-07-29 ENCOUNTER — Other Ambulatory Visit: Payer: Self-pay | Admitting: Cardiovascular Disease

## 2013-07-29 LAB — PROTIME-INR
INR: 2.15 — AB (ref <1.50)
Prothrombin Time: 23.5 seconds — ABNORMAL HIGH (ref 11.6–15.2)

## 2013-07-30 ENCOUNTER — Ambulatory Visit (INDEPENDENT_AMBULATORY_CARE_PROVIDER_SITE_OTHER): Payer: Medicare Other | Admitting: Pharmacist Clinician (PhC)/ Clinical Pharmacy Specialist

## 2013-07-30 DIAGNOSIS — Z7901 Long term (current) use of anticoagulants: Secondary | ICD-10-CM

## 2013-07-30 DIAGNOSIS — I4891 Unspecified atrial fibrillation: Secondary | ICD-10-CM

## 2013-08-06 ENCOUNTER — Other Ambulatory Visit: Payer: Self-pay | Admitting: Cardiovascular Disease

## 2013-08-17 ENCOUNTER — Encounter: Payer: Medicare Other | Admitting: Cardiovascular Disease

## 2013-08-25 ENCOUNTER — Other Ambulatory Visit: Payer: Self-pay | Admitting: Cardiovascular Disease

## 2013-08-25 NOTE — Telephone Encounter (Signed)
Pt came in for his visit closing encounter °

## 2013-08-26 ENCOUNTER — Ambulatory Visit (INDEPENDENT_AMBULATORY_CARE_PROVIDER_SITE_OTHER): Payer: Medicare Other | Admitting: Cardiovascular Disease

## 2013-08-26 VITALS — BP 130/104 | HR 88 | Ht 70.5 in | Wt 186.4 lb

## 2013-08-26 DIAGNOSIS — I4891 Unspecified atrial fibrillation: Secondary | ICD-10-CM

## 2013-08-26 LAB — PACEMAKER DEVICE OBSERVATION

## 2013-08-26 LAB — PROTIME-INR
INR: 2.16 — AB (ref <1.50)
PROTHROMBIN TIME: 23.6 s — AB (ref 11.6–15.2)

## 2013-08-26 NOTE — Patient Instructions (Signed)
Remote monitoring is used to monitor your pacemaker from home. This monitoring reduces the number of office visits required to check your device to one time per year. It allows us to keep an eye on the functioning of your device to ensure it is working properly. You are scheduled for a device check from home on 11-29-2013. You may send your transmission at any time that day. If you have a wireless device, the transmission will be sent automatically. After your physician reviews your transmission, you will receive a postcard with your next transmission date.  Your physician recommends that you schedule a follow-up appointment in: 12 months with Dr.Croitoru    

## 2013-08-27 ENCOUNTER — Ambulatory Visit (INDEPENDENT_AMBULATORY_CARE_PROVIDER_SITE_OTHER): Payer: Medicare Other | Admitting: Pharmacist Clinician (PhC)/ Clinical Pharmacy Specialist

## 2013-08-27 DIAGNOSIS — Z7901 Long term (current) use of anticoagulants: Secondary | ICD-10-CM

## 2013-08-27 DIAGNOSIS — I4891 Unspecified atrial fibrillation: Secondary | ICD-10-CM

## 2013-08-31 ENCOUNTER — Encounter: Payer: Self-pay | Admitting: Cardiovascular Disease

## 2013-08-31 LAB — MDC_IDC_ENUM_SESS_TYPE_INCLINIC
Battery Impedance: 112 Ohm
Brady Statistic AP VP Percent: 5 %
Brady Statistic AP VS Percent: 79 %
Brady Statistic AS VP Percent: 7 %
Brady Statistic AS VS Percent: 8 %
Date Time Interrogation Session: 20150312172757
Lead Channel Impedance Value: 604 Ohm
Lead Channel Pacing Threshold Amplitude: 0.5 V
Lead Channel Pacing Threshold Pulse Width: 0.4 ms
Lead Channel Pacing Threshold Pulse Width: 0.76 ms
Lead Channel Sensing Intrinsic Amplitude: 11.2 mV
Lead Channel Sensing Intrinsic Amplitude: 4 mV
Lead Channel Setting Pacing Amplitude: 2.75 V
Lead Channel Setting Sensing Sensitivity: 4 mV
MDC IDC MSMT BATTERY REMAINING LONGEVITY: 105 mo
MDC IDC MSMT BATTERY VOLTAGE: 2.77 V
MDC IDC MSMT LEADCHNL RA IMPEDANCE VALUE: 412 Ohm
MDC IDC MSMT LEADCHNL RA PACING THRESHOLD AMPLITUDE: 1.25 V
MDC IDC SET LEADCHNL RV PACING AMPLITUDE: 2.5 V
MDC IDC SET LEADCHNL RV PACING PULSEWIDTH: 0.4 ms

## 2013-08-31 NOTE — Progress Notes (Signed)
Patient ID: Jon [name], male   DOB: 19-Feb-1932, 80 y.o.   MRN: [id number]     Reason for office visit Pacemaker followup, lower extremity edema, diastolic dysfunction  Jon [name] is a 80 year old gentleman who received a dual chamber permanent pacemaker in 2006 for tachycardia-bradycardia syndrome (sinus node dysfunction and paroxysmal atrial fibrillation).   In 1991 coronary angiography showed normal coronary arteries. More recently he had a normal nuclear stress test in 2010. Echocardiography at the same time confirmed normal left ventricular systolic function in the absence of significant valvular abnormalities. Left atrial size is actually normal according to that study.   He has long-standing obstructive sleep apnea but is unwilling to use CPAP. He is afraid this will interfere with his ability to hear his wife's calls for assistance. He is her primary caregiver and she has several disabilities.   His device shows frequent and lengthy episodes of persistent atrial fibrillation, but he never seems to be truly aware of changes in rhythm and the arrhythmia has not led to any reduction in his activity level. He had a generator change out in 2014 and his pacemaker is functioning normally.  The overall burden of atrial fibrillation is roughly 22%, and this is actually an improvement since his last device check. When not in atrial fibrillation he has virtually 100% atrial pacing secondary to background severe sinus bradycardia in the 40s. He has roughly 12% ventricular pacing which has been constant over time.    Allergies  Allergen Reactions  . Iohexol      Desc: PT STATES THROAT/FACE SWELLING W/ IVP DYE IN 1990. PT HAS NOT HAD ANY EXAMS DONE W/DYE SINCE AND HAS NEVER BEEN PRE MEDICATED. PER PT, Onset Date: [id number]   . Dilantin [Phenytoin Sodium Extended] Rash  . Tegretol [Carbamazepine] Rash    Current Outpatient Prescriptions  Medication Sig Dispense Refill  . ALPRAZolam  (XANAX) 1 MG tablet Take 1 mg by mouth at bedtime as needed for sleep.      . clobetasol cream (TEMOVATE) 0.05 % Apply 0.05 application topically.      Marland Kitchen diltiazem (CARDIZEM CD) 120 MG 24 hr capsule Take 1 capsule (120 mg total) by mouth daily.  30 capsule  1  . fexofenadine (ALLEGRA) 180 MG tablet Take 180 mg by mouth daily.      . furosemide (LASIX) 40 MG tablet Take 1 tablet (40 mg total) by mouth daily as needed (for swelling in legs).  30 tablet  4  . HYDROcodone-acetaminophen (NORCO) 10-325 MG per tablet Take 1 tablet by mouth every 6 (six) hours as needed for pain.      Marland Kitchen ketoconazole (NIZORAL) 2 % cream Apply 2 application topically.      Marland Kitchen lisinopril (PRINIVIL,ZESTRIL) 20 MG tablet TAKE ONE TABLET BY MOUTH ONCE DAILY.  30 tablet  7  . omeprazole (PRILOSEC) 20 MG capsule Take 20 mg by mouth daily.      Bertram Gala Glycol-Propyl Glycol (SYSTANE OP) Place 1 drop into both eyes daily as needed (for dry eyes).      . potassium chloride (K-DUR,KLOR-CON) 10 MEQ tablet Take 1 tablet (10 mEq total) by mouth daily as needed (takes with lasix).  30 tablet  4  . predniSONE (DELTASONE) 5 MG tablet Take 5 mg by mouth daily as needed (for eczema on hand).      . sotalol (BETAPACE) 160 MG tablet TAKE 1 TABLET BY MOUTH TWICE DAILY.  60 tablet  0  . vitamin B-12 (CYANOCOBALAMIN) 1000  MCG tablet Take 1,000 mcg by mouth daily.      Marland Kitchen. warfarin (COUMADIN) 2.5 MG tablet Take 1-2 tablets by mouth daily as directed  40 tablet  6   No current facility-administered medications for this visit.    Past Medical History  Diagnosis Date  . Heart disease   . Atrial fibrillation     WITH PACEMAKER  . OSA (obstructive sleep apnea)   . Memory loss   . Bleeding ulcer   . B12 deficiency   . Hypertension   . Abnormality of gait 05/03/2013  . Essential and other specified forms of tremor 05/03/2013  . Polyneuropathy in other diseases classified elsewhere 05/03/2013  . Peripheral edema   . Varicose veins      Bilateral  . Rib fracture   . Macular degeneration of left eye     Past Surgical History  Procedure Laterality Date  . Pacemaker insertion  2006  . Meningioma removal  1992    POST RT FRONTAL   . Cataract extraction Bilateral 2013    Family History  Problem Relation Age of Onset  . Aortic aneurysm Mother   . Parkinsonism Mother   . Heart disease Father   . Cancer Sister     esaphageal cancer    History   Social History  . Marital Status: Married    Spouse Name: N/A    Number of Children: 2  . Years of Education: COLLEGE   Occupational History  . RETIRED    Social History Main Topics  . Smoking status: Former Smoker    Types: Cigarettes  . Smokeless tobacco: Never Used     Comment: QUIT 35 YRS AGO  . Alcohol Use: No  . Drug Use: No  . Sexual Activity: Not on file   Other Topics Concern  . Not on file   Social History Narrative  . No narrative on file    Review of systems: The patient specifically denies any chest pain at rest or with exertion, dyspnea at rest or with exertion, orthopnea, paroxysmal nocturnal dyspnea, syncope, palpitations, focal neurological deficits, intermittent claudication, lower extremity edema, unexplained weight gain, cough, hemoptysis or wheezing.  The patient also denies abdominal pain, nausea, vomiting, dysphagia, diarrhea, constipation, polyuria, polydipsia, dysuria, hematuria, frequency, urgency, abnormal bleeding or bruising, fever, chills, unexpected weight changes, mood swings, change in skin or hair texture, change in voice quality, auditory or visual problems, allergic reactions or rashes, new musculoskeletal complaints other than usual "aches and pains".   PHYSICAL EXAM BP 130/104  Pulse 88  Ht 5' 10.5" (1.791 m)  Wt 84.55 kg (186 lb 6.4 oz)  BMI 26.36 kg/m2  General: Alert, oriented x3, no distress Head: no evidence of trauma, PERRL, EOMI, no exophtalmos or lid lag, no myxedema, no xanthelasma; normal ears, nose and  oropharynx Neck: normal jugular venous pulsations and no hepatojugular reflux; brisk carotid pulses without delay and no carotid bruits Chest: clear to auscultation, no signs of consolidation by percussion or palpation, normal fremitus, symmetrical and full respiratory excursions Cardiovascular: normal position and quality of the apical impulse, regular rhythm, normal first and second heart sounds, no murmurs, rubs or gallops Abdomen: no tenderness or distention, no masses by palpation, no abnormal pulsatility or arterial bruits, normal bowel sounds, no hepatosplenomegaly Extremities: Chronic changes of brawny edema, but no clubbing, cyanosis, only 2+ bilateral ankle edema; 2+ radial, ulnar and brachial pulses bilaterally; 2+ right femoral, posterior tibial and dorsalis pedis pulses; 2+ left femoral, posterior tibial and  dorsalis pedis pulses; no subclavian or femoral bruits Neurological: grossly nonfocal   EKG: Atrial paced, ventricular sensed with a single PAC, incomplete right bundle branch block and questionable old lateral infarction, not changed from previous tracings.  Lipid profile March 6 total cholesterol 179, HDL 36, triglycerides 135, LDL 116. While not ideal, his lipid profile does not warrant statin therapy in the absence of structural heart disease/coronary disease/other vascular problems  ASSESSMENT AND PLAN  Jon [name] has a fairly high burden of asymptomatic atrial fibrillation while chronic treatment with sotalol. Recent laboratory tests show preserved renal function (creatinine 0.9) and normal electrolyte levels (potassium 4.3). The QT interval is slightly prolonged but remained in the acceptable range especially considering that he has a minor intraventricular conduction delay. No changes are made to his medications today.  Patient Instructions  Remote monitoring is used to monitor your pacemaker from home. This monitoring reduces the number of office visits required to  check your device to one time per year. It allows [place] to keep an eye on the functioning of your device to ensure it is working properly. You are scheduled for a device check from home on 11-29-2013. You may send your transmission at any time that day. If you have a wireless device, the transmission will be sent automatically. After your physician reviews your transmission, you will receive a postcard with your next transmission date.  Your physician recommends that you schedule a follow-up appointment in: 12 months with Dr.Trysten Berti        Orders Placed This Encounter  Procedures  . Implantable device check   No orders of the defined types were placed in this encounter.    Junious Silk, MD, [facility] [phone] office [phone] pager

## 2013-09-05 ENCOUNTER — Other Ambulatory Visit: Payer: Self-pay | Admitting: Cardiovascular Disease

## 2013-09-06 NOTE — Telephone Encounter (Signed)
Rx was sent to pharmacy electronically. 

## 2013-09-16 ENCOUNTER — Other Ambulatory Visit: Payer: Self-pay | Admitting: Cardiovascular Disease

## 2013-09-16 NOTE — Telephone Encounter (Signed)
Rx was sent to pharmacy electronically. 

## 2013-09-22 ENCOUNTER — Other Ambulatory Visit: Payer: Self-pay | Admitting: Cardiovascular Disease

## 2013-09-23 ENCOUNTER — Ambulatory Visit (INDEPENDENT_AMBULATORY_CARE_PROVIDER_SITE_OTHER): Payer: Medicare Other | Admitting: Pharmacist Clinician (PhC)/ Clinical Pharmacy Specialist

## 2013-09-23 DIAGNOSIS — I4891 Unspecified atrial fibrillation: Secondary | ICD-10-CM

## 2013-09-23 DIAGNOSIS — Z7901 Long term (current) use of anticoagulants: Secondary | ICD-10-CM

## 2013-09-23 LAB — PROTIME-INR
INR: 2.36 — AB (ref <1.50)
Prothrombin Time: 25.2 seconds — ABNORMAL HIGH (ref 11.6–15.2)

## 2013-10-22 ENCOUNTER — Other Ambulatory Visit: Payer: Self-pay | Admitting: Cardiovascular Disease

## 2013-10-23 LAB — PROTIME-INR
INR: 2.15 — AB (ref <1.50)
Prothrombin Time: 23.5 seconds — ABNORMAL HIGH (ref 11.6–15.2)

## 2013-10-25 ENCOUNTER — Ambulatory Visit (INDEPENDENT_AMBULATORY_CARE_PROVIDER_SITE_OTHER): Payer: Medicare Other | Admitting: Pharmacist Clinician (PhC)/ Clinical Pharmacy Specialist

## 2013-10-25 DIAGNOSIS — I4891 Unspecified atrial fibrillation: Secondary | ICD-10-CM

## 2013-10-25 DIAGNOSIS — Z7901 Long term (current) use of anticoagulants: Secondary | ICD-10-CM

## 2013-11-24 ENCOUNTER — Other Ambulatory Visit: Payer: Self-pay | Admitting: Cardiovascular Disease

## 2013-11-25 ENCOUNTER — Ambulatory Visit (INDEPENDENT_AMBULATORY_CARE_PROVIDER_SITE_OTHER): Payer: Medicare Other | Admitting: Pharmacist Clinician (PhC)/ Clinical Pharmacy Specialist

## 2013-11-25 DIAGNOSIS — Z7901 Long term (current) use of anticoagulants: Secondary | ICD-10-CM

## 2013-11-25 DIAGNOSIS — I4891 Unspecified atrial fibrillation: Secondary | ICD-10-CM

## 2013-11-25 LAB — PROTIME-INR
INR: 2.65 — ABNORMAL HIGH (ref <1.50)
PROTHROMBIN TIME: 27.6 s — AB (ref 11.6–15.2)

## 2013-11-29 ENCOUNTER — Ambulatory Visit (INDEPENDENT_AMBULATORY_CARE_PROVIDER_SITE_OTHER): Payer: Medicare Other | Admitting: *Deleted

## 2013-11-29 DIAGNOSIS — I4891 Unspecified atrial fibrillation: Secondary | ICD-10-CM

## 2013-11-29 LAB — MDC_IDC_ENUM_SESS_TYPE_REMOTE
Battery Remaining Longevity: 113 mo
Battery Voltage: 2.79 V
Brady Statistic AP VS Percent: 79.4 %
Brady Statistic AS VS Percent: 8.6 %
Lead Channel Impedance Value: 630 Ohm
Lead Channel Pacing Threshold Amplitude: 0.75 V
Lead Channel Sensing Intrinsic Amplitude: 5.6 mV
Lead Channel Setting Pacing Amplitude: 2.5 V
Lead Channel Setting Pacing Pulse Width: 0.4 ms
Lead Channel Setting Sensing Sensitivity: 4 mV
MDC IDC MSMT LEADCHNL RA IMPEDANCE VALUE: 460 Ohm
MDC IDC MSMT LEADCHNL RA SENSING INTR AMPL: 1.75 mV
MDC IDC MSMT LEADCHNL RV PACING THRESHOLD PULSEWIDTH: 0.4 ms
MDC IDC SET LEADCHNL RA PACING AMPLITUDE: 2.75 V
MDC IDC STAT BRADY AP VP PERCENT: 2.6 %
MDC IDC STAT BRADY AS VP PERCENT: 0.3 %

## 2013-11-30 NOTE — Progress Notes (Signed)
Remote pacemaker transmission.   

## 2013-12-24 ENCOUNTER — Other Ambulatory Visit: Payer: Self-pay | Admitting: Cardiovascular Disease

## 2013-12-25 LAB — PROTIME-INR
INR: 2.29 — ABNORMAL HIGH (ref <1.50)
Prothrombin Time: 25.2 seconds — ABNORMAL HIGH (ref 11.6–15.2)

## 2013-12-27 ENCOUNTER — Ambulatory Visit (INDEPENDENT_AMBULATORY_CARE_PROVIDER_SITE_OTHER): Payer: Medicare Other | Admitting: Pharmacist

## 2013-12-27 DIAGNOSIS — I4891 Unspecified atrial fibrillation: Secondary | ICD-10-CM

## 2013-12-27 DIAGNOSIS — Z7901 Long term (current) use of anticoagulants: Secondary | ICD-10-CM

## 2013-12-29 ENCOUNTER — Encounter: Payer: Self-pay | Admitting: Cardiology

## 2014-01-04 ENCOUNTER — Encounter: Payer: Self-pay | Admitting: Cardiovascular Disease

## 2014-01-20 ENCOUNTER — Other Ambulatory Visit: Payer: Self-pay | Admitting: Cardiovascular Disease

## 2014-01-21 ENCOUNTER — Ambulatory Visit (INDEPENDENT_AMBULATORY_CARE_PROVIDER_SITE_OTHER): Payer: Medicare Other | Admitting: Pharmacist Clinician (PhC)/ Clinical Pharmacy Specialist

## 2014-01-21 DIAGNOSIS — Z7901 Long term (current) use of anticoagulants: Secondary | ICD-10-CM

## 2014-01-21 DIAGNOSIS — I4891 Unspecified atrial fibrillation: Secondary | ICD-10-CM

## 2014-01-21 LAB — PROTIME-INR
INR: 3.21 — AB (ref <1.50)
Prothrombin Time: 32.8 seconds — ABNORMAL HIGH (ref 11.6–15.2)

## 2014-02-09 ENCOUNTER — Other Ambulatory Visit: Payer: Self-pay | Admitting: Cardiovascular Disease

## 2014-02-10 LAB — PROTIME-INR
INR: 2.56 — AB (ref <1.50)
Prothrombin Time: 27.5 seconds — ABNORMAL HIGH (ref 11.6–15.2)

## 2014-02-11 ENCOUNTER — Ambulatory Visit (INDEPENDENT_AMBULATORY_CARE_PROVIDER_SITE_OTHER): Payer: Medicare Other | Admitting: Pharmacist Clinician (PhC)/ Clinical Pharmacy Specialist

## 2014-02-11 DIAGNOSIS — I4891 Unspecified atrial fibrillation: Secondary | ICD-10-CM

## 2014-02-11 DIAGNOSIS — Z7901 Long term (current) use of anticoagulants: Secondary | ICD-10-CM

## 2014-03-02 ENCOUNTER — Encounter: Payer: Self-pay | Admitting: Cardiovascular Disease

## 2014-03-02 ENCOUNTER — Ambulatory Visit (INDEPENDENT_AMBULATORY_CARE_PROVIDER_SITE_OTHER): Payer: Medicare Other | Admitting: *Deleted

## 2014-03-02 DIAGNOSIS — I4891 Unspecified atrial fibrillation: Secondary | ICD-10-CM

## 2014-03-02 NOTE — Progress Notes (Signed)
Remote pacemaker transmission.   

## 2014-03-10 ENCOUNTER — Other Ambulatory Visit: Payer: Self-pay | Admitting: Cardiovascular Disease

## 2014-03-11 ENCOUNTER — Ambulatory Visit (INDEPENDENT_AMBULATORY_CARE_PROVIDER_SITE_OTHER): Payer: Medicare Other | Admitting: Pharmacist Clinician (PhC)/ Clinical Pharmacy Specialist

## 2014-03-11 ENCOUNTER — Other Ambulatory Visit: Payer: Self-pay | Admitting: Pharmacist Clinician (PhC)/ Clinical Pharmacy Specialist

## 2014-03-11 DIAGNOSIS — Z7901 Long term (current) use of anticoagulants: Secondary | ICD-10-CM

## 2014-03-11 DIAGNOSIS — I4891 Unspecified atrial fibrillation: Secondary | ICD-10-CM

## 2014-03-11 LAB — PROTIME-INR
INR: 2.56 — AB (ref <1.50)
PROTHROMBIN TIME: 27.5 s — AB (ref 11.6–15.2)

## 2014-03-11 MED ORDER — WARFARIN SODIUM 2.5 MG PO TABS: ORAL_TABLET | ORAL | Status: DC

## 2014-03-20 LAB — MDC_IDC_ENUM_SESS_TYPE_REMOTE
Battery Impedance: 184 Ohm
Brady Statistic AP VS Percent: 79 %
Brady Statistic AS VP Percent: 11 %
Brady Statistic AS VS Percent: 8 %
Date Time Interrogation Session: 20150916093921
Lead Channel Impedance Value: 619 Ohm
Lead Channel Pacing Threshold Amplitude: 1.25 V
Lead Channel Pacing Threshold Amplitude: 1.875 V
Lead Channel Pacing Threshold Pulse Width: 0.4 ms
Lead Channel Pacing Threshold Pulse Width: 0.4 ms
Lead Channel Setting Sensing Sensitivity: 2.8 mV
MDC IDC MSMT BATTERY REMAINING LONGEVITY: 91 mo
MDC IDC MSMT BATTERY VOLTAGE: 2.78 V
MDC IDC MSMT LEADCHNL RA IMPEDANCE VALUE: 407 Ohm
MDC IDC MSMT LEADCHNL RV SENSING INTR AMPL: 16 mV
MDC IDC SET LEADCHNL RA PACING AMPLITUDE: 4 V
MDC IDC SET LEADCHNL RV PACING AMPLITUDE: 2.5 V
MDC IDC SET LEADCHNL RV PACING PULSEWIDTH: 0.4 ms
MDC IDC STAT BRADY AP VP PERCENT: 3 %

## 2014-03-23 ENCOUNTER — Encounter: Payer: Self-pay | Admitting: Cardiology

## 2014-04-11 ENCOUNTER — Other Ambulatory Visit: Payer: Self-pay | Admitting: Cardiovascular Disease

## 2014-04-12 LAB — PROTIME-INR
INR: 2.05 — ABNORMAL HIGH (ref <1.50)
Prothrombin Time: 23.1 seconds — ABNORMAL HIGH (ref 11.6–15.2)

## 2014-04-13 ENCOUNTER — Ambulatory Visit (INDEPENDENT_AMBULATORY_CARE_PROVIDER_SITE_OTHER): Payer: Medicare Other | Admitting: Pharmacist Clinician (PhC)/ Clinical Pharmacy Specialist

## 2014-04-13 DIAGNOSIS — I4891 Unspecified atrial fibrillation: Secondary | ICD-10-CM

## 2014-05-04 ENCOUNTER — Other Ambulatory Visit: Payer: Self-pay | Admitting: Cardiovascular Disease

## 2014-05-04 ENCOUNTER — Encounter: Payer: Self-pay | Admitting: Neurology

## 2014-05-05 ENCOUNTER — Ambulatory Visit (INDEPENDENT_AMBULATORY_CARE_PROVIDER_SITE_OTHER): Payer: Medicare Other | Admitting: Pharmacist Clinician (PhC)/ Clinical Pharmacy Specialist

## 2014-05-05 DIAGNOSIS — I4891 Unspecified atrial fibrillation: Secondary | ICD-10-CM

## 2014-05-05 LAB — PROTIME-INR
INR: 2.4 — AB (ref <1.50)
Prothrombin Time: 26.2 seconds — ABNORMAL HIGH (ref 11.6–15.2)

## 2014-05-09 ENCOUNTER — Other Ambulatory Visit: Payer: Self-pay

## 2014-05-09 MED ORDER — POTASSIUM CHLORIDE CRYS ER 10 MEQ PO TBCR: 10.0000 meq | EXTENDED_RELEASE_TABLET | Freq: Every day | ORAL | Status: DC | PRN

## 2014-05-09 MED ORDER — FUROSEMIDE 40 MG PO TABS: 40.0000 mg | ORAL_TABLET | Freq: Every day | ORAL | Status: DC | PRN

## 2014-05-09 NOTE — Telephone Encounter (Signed)
Rx sent to pharmacy   

## 2014-05-10 ENCOUNTER — Encounter: Payer: Self-pay | Admitting: Neurology

## 2014-05-26 ENCOUNTER — Encounter (HOSPITAL_COMMUNITY): Payer: Self-pay | Admitting: Cardiovascular Disease

## 2014-06-01 ENCOUNTER — Other Ambulatory Visit: Payer: Self-pay | Admitting: Cardiovascular Disease

## 2014-06-02 LAB — PROTIME-INR
INR: 2.16 — ABNORMAL HIGH (ref <1.50)
Prothrombin Time: 24.1 seconds — ABNORMAL HIGH (ref 11.6–15.2)

## 2014-06-03 ENCOUNTER — Ambulatory Visit (INDEPENDENT_AMBULATORY_CARE_PROVIDER_SITE_OTHER): Payer: Medicare Other | Admitting: Pharmacist Clinician (PhC)/ Clinical Pharmacy Specialist

## 2014-06-03 DIAGNOSIS — I4891 Unspecified atrial fibrillation: Secondary | ICD-10-CM

## 2014-06-06 ENCOUNTER — Encounter: Payer: Medicare Other | Admitting: *Deleted

## 2014-06-06 ENCOUNTER — Telehealth: Payer: Self-pay | Admitting: Cardiology

## 2014-06-06 NOTE — Telephone Encounter (Signed)
Spoke with pt and reminded pt of remote transmission that is due today. Pt verbalized understanding.   

## 2014-06-07 ENCOUNTER — Encounter: Payer: Self-pay | Admitting: Cardiology

## 2014-06-07 ENCOUNTER — Ambulatory Visit (INDEPENDENT_AMBULATORY_CARE_PROVIDER_SITE_OTHER): Payer: Medicare Other | Admitting: *Deleted

## 2014-06-07 DIAGNOSIS — I4891 Unspecified atrial fibrillation: Secondary | ICD-10-CM

## 2014-06-07 NOTE — Progress Notes (Signed)
Remote pacemaker transmission.   

## 2014-06-08 LAB — MDC_IDC_ENUM_SESS_TYPE_REMOTE
Battery Remaining Longevity: 108 mo
Brady Statistic AP VP Percent: 3.3 %
Brady Statistic AP VS Percent: 74.7 %
Brady Statistic AS VP Percent: 14.1 %
Lead Channel Pacing Threshold Amplitude: 0.75 V
Lead Channel Pacing Threshold Pulse Width: 0.4 ms
Lead Channel Sensing Intrinsic Amplitude: 2.8 mV
Lead Channel Sensing Intrinsic Amplitude: 5.6 mV
Lead Channel Setting Pacing Amplitude: 2.5 V
Lead Channel Setting Pacing Amplitude: 4 V
Lead Channel Setting Pacing Pulse Width: 0.4 ms
Lead Channel Setting Sensing Sensitivity: 2.8 mV
MDC IDC MSMT LEADCHNL RA IMPEDANCE VALUE: 447 Ohm
MDC IDC MSMT LEADCHNL RA PACING THRESHOLD AMPLITUDE: 1.625 V
MDC IDC MSMT LEADCHNL RA PACING THRESHOLD PULSEWIDTH: 0.4 ms
MDC IDC MSMT LEADCHNL RV IMPEDANCE VALUE: 697 Ohm
MDC IDC STAT BRADY AS VS PERCENT: 8 %

## 2014-06-09 ENCOUNTER — Inpatient Hospital Stay (HOSPITAL_COMMUNITY)
Admission: EM | Admit: 2014-06-09 | Discharge: 2014-06-11 | DRG: 641 | Disposition: A | Discharge status: Home Health | Payer: Medicare Other | Attending: Internal Medicine | Admitting: Internal Medicine

## 2014-06-09 ENCOUNTER — Encounter (HOSPITAL_COMMUNITY): Payer: Self-pay | Admitting: Emergency Medicine

## 2014-06-09 ENCOUNTER — Emergency Department (HOSPITAL_COMMUNITY): Payer: Medicare Other

## 2014-06-09 DIAGNOSIS — I482 Chronic atrial fibrillation: Secondary | ICD-10-CM | POA: Diagnosis present

## 2014-06-09 DIAGNOSIS — G4733 Obstructive sleep apnea (adult) (pediatric): Secondary | ICD-10-CM | POA: Diagnosis present

## 2014-06-09 DIAGNOSIS — Z7952 Long term (current) use of systemic steroids: Secondary | ICD-10-CM

## 2014-06-09 DIAGNOSIS — H3530 Unspecified macular degeneration: Secondary | ICD-10-CM | POA: Diagnosis present

## 2014-06-09 DIAGNOSIS — I10 Essential (primary) hypertension: Secondary | ICD-10-CM | POA: Diagnosis present

## 2014-06-09 DIAGNOSIS — M6282 Rhabdomyolysis: Secondary | ICD-10-CM | POA: Diagnosis present

## 2014-06-09 DIAGNOSIS — Z950 Presence of cardiac pacemaker: Secondary | ICD-10-CM | POA: Diagnosis not present

## 2014-06-09 DIAGNOSIS — R52 Pain, unspecified: Secondary | ICD-10-CM

## 2014-06-09 DIAGNOSIS — I5032 Chronic diastolic (congestive) heart failure: Secondary | ICD-10-CM | POA: Diagnosis present

## 2014-06-09 DIAGNOSIS — Z79891 Long term (current) use of opiate analgesic: Secondary | ICD-10-CM | POA: Diagnosis not present

## 2014-06-09 DIAGNOSIS — Z800 Family history of malignant neoplasm of digestive organs: Secondary | ICD-10-CM | POA: Diagnosis not present

## 2014-06-09 DIAGNOSIS — Z7901 Long term (current) use of anticoagulants: Secondary | ICD-10-CM | POA: Diagnosis not present

## 2014-06-09 DIAGNOSIS — R531 Weakness: Secondary | ICD-10-CM | POA: Diagnosis not present

## 2014-06-09 DIAGNOSIS — Z8249 Family history of ischemic heart disease and other diseases of the circulatory system: Secondary | ICD-10-CM | POA: Diagnosis not present

## 2014-06-09 DIAGNOSIS — E860 Dehydration: Principal | ICD-10-CM | POA: Diagnosis present

## 2014-06-09 DIAGNOSIS — Z87891 Personal history of nicotine dependence: Secondary | ICD-10-CM | POA: Diagnosis not present

## 2014-06-09 DIAGNOSIS — M25569 Pain in unspecified knee: Secondary | ICD-10-CM | POA: Insufficient documentation

## 2014-06-09 DIAGNOSIS — I4891 Unspecified atrial fibrillation: Secondary | ICD-10-CM

## 2014-06-09 LAB — COMPREHENSIVE METABOLIC PANEL
ALT: 35 U/L (ref 0–53)
AST: 68 U/L — AB (ref 0–37)
Albumin: 3.2 g/dL — ABNORMAL LOW (ref 3.5–5.2)
Alkaline Phosphatase: 100 U/L (ref 39–117)
Anion gap: 11 (ref 5–15)
BUN: 70 mg/dL — ABNORMAL HIGH (ref 6–23)
CALCIUM: 9.4 mg/dL (ref 8.4–10.5)
CO2: 25 mmol/L (ref 19–32)
CREATININE: 1.29 mg/dL (ref 0.50–1.35)
Chloride: 98 mEq/L (ref 96–112)
GFR calc Af Amer: 59 mL/min — ABNORMAL LOW (ref >90)
GFR, EST NON AFRICAN AMERICAN: 51 mL/min — AB (ref >90)
Glucose, Bld: 126 mg/dL — ABNORMAL HIGH (ref 70–99)
Potassium: 4.2 mmol/L (ref 3.5–5.1)
Sodium: 134 mmol/L — ABNORMAL LOW (ref 135–145)
Total Bilirubin: 1.4 mg/dL — ABNORMAL HIGH (ref 0.3–1.2)
Total Protein: 6.9 g/dL (ref 6.0–8.3)

## 2014-06-09 LAB — URINALYSIS, ROUTINE W REFLEX MICROSCOPIC
Bilirubin Urine: NEGATIVE
Glucose, UA: NEGATIVE mg/dL
KETONES UR: NEGATIVE mg/dL
Leukocytes, UA: NEGATIVE
Nitrite: NEGATIVE
Protein, ur: NEGATIVE mg/dL
Specific Gravity, Urine: 1.025 (ref 1.005–1.030)
UROBILINOGEN UA: 0.2 mg/dL (ref 0.0–1.0)
pH: 5.5 (ref 5.0–8.0)

## 2014-06-09 LAB — CBC WITH DIFFERENTIAL/PLATELET
BASOS ABS: 0 10*3/uL (ref 0.0–0.1)
Basophils Relative: 0 % (ref 0–1)
Eosinophils Absolute: 0 10*3/uL (ref 0.0–0.7)
Eosinophils Relative: 0 % (ref 0–5)
HCT: 45.2 % (ref 39.0–52.0)
Hemoglobin: 15.5 g/dL (ref 13.0–17.0)
Lymphocytes Relative: 6 % — ABNORMAL LOW (ref 12–46)
Lymphs Abs: 0.8 10*3/uL (ref 0.7–4.0)
MCH: 29.4 pg (ref 26.0–34.0)
MCHC: 34.3 g/dL (ref 30.0–36.0)
MCV: 85.6 fL (ref 78.0–100.0)
Monocytes Absolute: 0.8 10*3/uL (ref 0.1–1.0)
Monocytes Relative: 7 % (ref 3–12)
NEUTROS ABS: 11 10*3/uL — AB (ref 1.7–7.7)
Neutrophils Relative %: 87 % — ABNORMAL HIGH (ref 43–77)
Platelets: 300 10*3/uL (ref 150–400)
RBC: 5.28 MIL/uL (ref 4.22–5.81)
RDW: 14.8 % (ref 11.5–15.5)
WBC: 12.6 10*3/uL — AB (ref 4.0–10.5)

## 2014-06-09 LAB — LACTIC ACID, PLASMA: LACTIC ACID, VENOUS: 1.7 mmol/L (ref 0.5–2.2)

## 2014-06-09 LAB — TROPONIN I
Troponin I: <0.03 ng/mL (ref <0.031)
Troponin I: <0.03 ng/mL (ref <0.031)

## 2014-06-09 LAB — PROTIME-INR
INR: 3.49 — AB (ref 0.00–1.49)
Prothrombin Time: 35.3 seconds — ABNORMAL HIGH (ref 11.6–15.2)

## 2014-06-09 LAB — URINE MICROSCOPIC-ADD ON

## 2014-06-09 LAB — CK: Total CK: 1141 U/L — ABNORMAL HIGH (ref 7–232)

## 2014-06-09 MED ORDER — DILTIAZEM HCL ER COATED BEADS 120 MG PO CP24
120.0000 mg | ORAL_CAPSULE | Freq: Every day | ORAL | Status: DC
  Administered 2014-06-09 – 2014-06-10 (×2): 120 mg via ORAL
  Filled 2014-06-09 (×2): qty 1

## 2014-06-09 MED ORDER — CLOBETASOL PROPIONATE 0.05 % EX CREA
1.0000 "application " | TOPICAL_CREAM | Freq: Two times a day (BID) | CUTANEOUS | Status: DC
  Filled 2014-06-09: qty 15

## 2014-06-09 MED ORDER — POLYVINYL ALCOHOL 1.4 % OP SOLN: 1.0000 [drp] | Freq: Every day | OPHTHALMIC | Status: DC | PRN

## 2014-06-09 MED ORDER — WARFARIN SODIUM 5 MG PO TABS: 2.5000 mg | ORAL_TABLET | Freq: Once | ORAL | Status: DC

## 2014-06-09 MED ORDER — ALPRAZOLAM 1 MG PO TABS
1.0000 mg | ORAL_TABLET | Freq: Every evening | ORAL | Status: DC | PRN
  Filled 2014-06-09: qty 1

## 2014-06-09 MED ORDER — SOTALOL HCL 80 MG PO TABS
160.0000 mg | ORAL_TABLET | Freq: Two times a day (BID) | ORAL | Status: DC
  Administered 2014-06-09 – 2014-06-11 (×4): 160 mg via ORAL
  Filled 2014-06-09 (×10): qty 2

## 2014-06-09 MED ORDER — SODIUM CHLORIDE 0.9 % IJ SOLN: 3.0000 mL | Freq: Two times a day (BID) | INTRAMUSCULAR | Status: DC

## 2014-06-09 MED ORDER — ONDANSETRON HCL 4 MG PO TABS: 4.0000 mg | ORAL_TABLET | Freq: Four times a day (QID) | ORAL | Status: DC | PRN

## 2014-06-09 MED ORDER — LORATADINE 10 MG PO TABS
10.0000 mg | ORAL_TABLET | Freq: Every day | ORAL | Status: DC
Start: 2014-06-09 — End: 2014-06-11
  Administered 2014-06-09 – 2014-06-11 (×3): 10 mg via ORAL
  Filled 2014-06-09 (×3): qty 1

## 2014-06-09 MED ORDER — HYDROCODONE-ACETAMINOPHEN 10-325 MG PO TABS
1.0000 | ORAL_TABLET | Freq: Four times a day (QID) | ORAL | Status: DC | PRN
  Administered 2014-06-10 – 2014-06-11 (×3): 1 via ORAL
  Filled 2014-06-09 (×4): qty 1

## 2014-06-09 MED ORDER — ONDANSETRON HCL 4 MG/2ML IJ SOLN: 4.0000 mg | Freq: Four times a day (QID) | INTRAMUSCULAR | Status: DC | PRN

## 2014-06-09 MED ORDER — CLOBETASOL PROPIONATE 0.05 % EX OINT
TOPICAL_OINTMENT | Freq: Two times a day (BID) | CUTANEOUS | Status: DC
  Administered 2014-06-09 – 2014-06-11 (×4): via TOPICAL
  Filled 2014-06-09: qty 15

## 2014-06-09 MED ORDER — KETOCONAZOLE 2 % EX CREA
1.0000 "application " | TOPICAL_CREAM | Freq: Two times a day (BID) | CUTANEOUS | Status: DC
  Administered 2014-06-09 – 2014-06-11 (×4): 1 via TOPICAL
  Filled 2014-06-09: qty 15

## 2014-06-09 MED ORDER — POLYETHYL GLYCOL-PROPYL GLYCOL 0.4-0.3 % OP SOLN: Freq: Every day | OPHTHALMIC | Status: DC | PRN

## 2014-06-09 MED ORDER — PANTOPRAZOLE SODIUM 40 MG PO TBEC
40.0000 mg | DELAYED_RELEASE_TABLET | Freq: Every day | ORAL | Status: DC
Start: 2014-06-09 — End: 2014-06-11
  Administered 2014-06-09 – 2014-06-11 (×3): 40 mg via ORAL
  Filled 2014-06-09 (×3): qty 1

## 2014-06-09 MED ORDER — SODIUM CHLORIDE 0.9 % IV BOLUS (SEPSIS)
500.0000 mL | Freq: Once | INTRAVENOUS | Status: AC
  Administered 2014-06-09: 500 mL via INTRAVENOUS

## 2014-06-09 MED ORDER — SODIUM CHLORIDE 0.9 % IV SOLN
INTRAVENOUS | Status: DC
  Administered 2014-06-09 – 2014-06-11 (×3): via INTRAVENOUS

## 2014-06-09 MED ORDER — VITAMIN B-12 1000 MCG PO TABS
1000.0000 ug | ORAL_TABLET | Freq: Every day | ORAL | Status: DC
  Administered 2014-06-09 – 2014-06-11 (×3): 1000 ug via ORAL
  Filled 2014-06-09 (×3): qty 1

## 2014-06-09 MED ORDER — WARFARIN - PHARMACIST DOSING INPATIENT: Status: DC

## 2014-06-09 MED ORDER — LISINOPRIL 10 MG PO TABS
20.0000 mg | ORAL_TABLET | Freq: Every day | ORAL | Status: DC
  Administered 2014-06-09 – 2014-06-10 (×2): 20 mg via ORAL
  Filled 2014-06-09 (×2): qty 2

## 2014-06-09 NOTE — H&P (Signed)
[facility] History and Physical  [name] Jobst ZOX:[id number] DOB: 1931-07-16 DOA: 06/09/2014  Referring physician: ER PCP: Milana ObeyKNOWLTON,STEPHEN D, MD   Chief Complaint: Weakness  HPI: [name] Kowalke is a 80 y.o. male  This is an 80 year old man who gives a one-week history of weakness. Approximately one week ago he had a fall but was able to get up and walk again. He says his knees have been painful. Yesterday he apparently slipped from an arm chair onto the floor and was unable to get up and his family members were unable to lift them up. He did not wish to come to the emergency room and therefore refused to call EMS. Today, his son who lives 2 hours away, finally came to his house and called EMS and brought him to the emergency room. His son says that he has also been somewhat confused over the last 2-3 days. The patient himself denies any symptoms such as chest pain, dyspnea, palpitations, nausea or vomiting. The patient has a history of paroxysmal atrial fibrillation and is on chronic anticoagulation therapy with warfarin. He is on opioid medications for pain. Evaluation in the emergency room shows that he has not broken any bones, especially hips or knees but that he is biochemically dehydrated and he is now being admitted for further management.   Review of Systems:  Apart from the above symptoms, all systems are negative.  Past Medical History  Diagnosis Date  . Heart disease   . Atrial fibrillation     WITH PACEMAKER  . OSA (obstructive sleep apnea)   . Memory loss   . Bleeding ulcer   . B12 deficiency   . Hypertension   . Abnormality of gait 05/03/2013  . Essential and other specified forms of tremor 05/03/2013  . Polyneuropathy in other diseases classified elsewhere 05/03/2013  . Peripheral edema   . Varicose veins     Bilateral  . Rib fracture   . Macular degeneration of left eye    Past Surgical History  Procedure Laterality Date  . Pacemaker insertion   2006  . Meningioma removal  1992    POST RT FRONTAL   . Cataract extraction Bilateral 2013  . Permanent pacemaker generator change N/A 08/13/2012    Procedure: PERMANENT PACEMAKER GENERATOR CHANGE;  Surgeon: Thurmon FairMihai Croitoru, MD;  Location: [facility]ATH [facility];  Service: Cardiovascular;  Laterality: N/A;   Social History:  reports that he has quit smoking. His smoking use included Cigarettes. He smoked 0.00 packs per day. He has never used smokeless tobacco. He reports that he does not drink alcohol or use illicit drugs.  Allergies  Allergen Reactions  . Iohexol      Desc: PT STATES THROAT/FACE SWELLING W/ IVP DYE IN 1990. PT HAS NOT HAD ANY EXAMS DONE W/DYE SINCE AND HAS NEVER BEEN PRE MEDICATED. PER PT, Onset Date: [id number]   . Dilantin [Phenytoin Sodium Extended] Rash  . Tegretol [Carbamazepine] Rash    Family History  Problem Relation Age of Onset  . Aortic aneurysm Mother   . Parkinsonism Mother   . Heart disease Father   . Cancer Sister     esaphageal cancer     Prior to Admission medications   Medication Sig Start Date End Date Taking? Authorizing Provider  diltiazem (CARDIZEM CD) 120 MG 24 hr capsule TAKE ONE CAPSULE BY MOUTH ONCE DAILY. 09/16/13  Yes Mihai Croitoru, MD  fexofenadine (ALLEGRA) 180 MG tablet Take 180 mg by mouth daily.   Yes Historical Provider,  MD  lisinopril (PRINIVIL,ZESTRIL) 20 MG tablet TAKE ONE TABLET BY MOUTH ONCE DAILY.   Yes Mihai Croitoru, MD  omeprazole (PRILOSEC) 20 MG capsule Take 20 mg by mouth daily.   Yes Historical Provider, MD  sotalol (BETAPACE) 160 MG tablet TAKE 1 TABLET BY MOUTH TWICE DAILY.   Yes Thurmon Fair, MD  warfarin (COUMADIN) 2.5 MG tablet Take 1-2 tablets by mouth daily as directed 03/11/14  Yes Phillips Hay, RPH-CPP  ALPRAZolam (XANAX) 1 MG tablet Take 1 mg by mouth at bedtime as needed for sleep.    Historical Provider, MD  clobetasol cream (TEMOVATE) 0.05 % Apply 0.05 application topically. 02/10/13   Historical Provider, MD    furosemide (LASIX) 40 MG tablet Take 1 tablet (40 mg total) by mouth daily as needed (for swelling in legs). 05/09/14   Mihai Croitoru, MD  HYDROcodone-acetaminophen (NORCO) 10-325 MG per tablet Take 1 tablet by mouth every 6 (six) hours as needed for pain.    Historical Provider, MD  ketoconazole (NIZORAL) 2 % cream Apply 2 application topically. 02/18/13   Historical Provider, MD  Polyethyl Glycol-Propyl Glycol (SYSTANE OP) Place 1 drop into both eyes daily as needed (for dry eyes).    Historical Provider, MD  potassium chloride (K-DUR,KLOR-CON) 10 MEQ tablet Take 1 tablet (10 mEq total) by mouth daily as needed (takes with lasix). 05/09/14   Mihai Croitoru, MD  predniSONE (DELTASONE) 5 MG tablet Take 5 mg by mouth daily as needed (for eczema on hand).    Historical Provider, MD  vitamin B-12 (CYANOCOBALAMIN) 1000 MCG tablet Take 1,000 mcg by mouth daily.    Historical Provider, MD   Physical Exam: Filed Vitals:   06/09/14 1339 06/09/14 1418 06/09/14 1452 06/09/14 1530  BP: 119/95 134/96 129/92 123/79  Pulse:  81    Temp:      TempSrc:      Resp: 18 19 16 13   Height:      Weight:      SpO2:  98%      Wt Readings from Last 3 Encounters:  06/09/14 81.647 kg (180 lb)  08/26/13 84.55 kg (186 lb 6.4 oz)  05/03/13 86.637 kg (191 lb)    General:  Appears slightly confused and dehydrated. However, he does respond to my questions fairly appropriately. Eyes: PERRL, normal lids, irises & conjunctiva ENT: grossly normal hearing, lips & tongue Neck: no LAD, masses or thyromegaly Cardiovascular: Irregularly irregular, consistent with atrial fibrillation. No clinical signs of heart failure. Telemetry: Atrial fibrillation, ventricular rate is controlled. Respiratory: CTA bilaterally, no w/r/r. Normal respiratory effort. Abdomen: soft, ntnd Skin: no rash or induration seen on limited exam Musculoskeletal: grossly normal tone BUE/BLE Psychiatric: grossly normal mood and affect, speech fluent and  appropriate Neurologic: grossly non-focal. slightly confused but not significantly so at this point.           Labs on Admission:  Basic Metabolic Panel:  Recent Labs Lab 06/09/14 1312  NA 134*  K 4.2  CL 98  CO2 25  GLUCOSE 126*  BUN 70*  CREATININE 1.29  CALCIUM 9.4   Liver Function Tests:  Recent Labs Lab 06/09/14 1312  AST 68*  ALT 35  ALKPHOS 100  BILITOT 1.4*  PROT 6.9  ALBUMIN 3.2*   No results for input(s): LIPASE, AMYLASE in the last 168 hours. No results for input(s): AMMONIA in the last 168 hours. CBC:  Recent Labs Lab 06/09/14 1312  WBC 12.6*  NEUTROABS 11.0*  HGB 15.5  HCT 45.2  MCV 85.6  PLT 300   Cardiac Enzymes:  Recent Labs Lab 06/09/14 1312  CKTOTAL 1141*  TROPONINI <0.03    BNP (last 3 results) No results for input(s): PROBNP in the last 8760 hours. CBG: No results for input(s): GLUCAP in the last 168 hours.  Radiological Exams on Admission: Dg Lumbar Spine Complete  06/09/2014   CLINICAL DATA:  Fall out of chair with back pain, initial encounter  EXAM: LUMBAR SPINE - COMPLETE 4+ VIEW  COMPARISON:  None.  FINDINGS: Multilevel degenerative changes are seen. No compression deformities are noted. Diffuse aortic calcifications are seen. No anterolisthesis is seen. The large and small bowel filled with gas although no true obstructive changes are seen. Fecal material is noted within the colon.  IMPRESSION: Multilevel degenerative change without acute abnormality.   Electronically Signed   By: Alcide Clever M.D.   On: 06/09/2014 14:18   Dg Pelvis 1-2 Views  06/09/2014   CLINICAL DATA:  Larey Seat out of chair today with pelvic pain, initial encounter  EXAM: PELVIS - 1-2 VIEW  COMPARISON:  None.  FINDINGS: The pelvic ring is intact. Mild degenerative changes of the sacroiliac joints and hip joints are noted. No acute fracture or dislocation is seen. No gross soft tissue abnormality is noted.  IMPRESSION: No acute abnormality noted.    Electronically Signed   By: Alcide Clever M.D.   On: 06/09/2014 14:19   Ct Head Wo Contrast  06/09/2014   CLINICAL DATA:  Weakness, slipped out of his chair today  EXAM: CT HEAD WITHOUT CONTRAST  TECHNIQUE: Contiguous axial images were obtained from the base of the skull through the vertex without intravenous contrast.  COMPARISON:  05/07/2008  FINDINGS: Again noted status post right frontal craniotomy.  There is mucosal thickening with complete opacification of the right maxillary sinus. Almost complete opacification of left maxillary sinus. The mastoid air cells are unremarkable.  No intracranial hemorrhage, mass effect or midline shift. Mild cerebral atrophy. Ventricular size is stable from prior exam. Stable periventricular and patchy subcortical chronic white matter disease. No acute cortical infarction. No mass lesion is noted on this unenhanced scan.  IMPRESSION: No acute intracranial abnormality. Again noted status post right frontal craniotomy. Stable atrophy and chronic white matter disease. No definite acute cortical infarction.   Electronically Signed   By: Natasha Mead M.D.   On: 06/09/2014 14:25   Dg Chest Port 1 View  06/09/2014   CLINICAL DATA:  80 year old male with a history of fall. Incontinence.  EXAM: PORTABLE CHEST - 1 VIEW  COMPARISON:  08/13/2012  FINDINGS: Cardiomediastinal silhouette unchanged in size and contour. Re- demonstration of atherosclerotic calcifications of aortic arch, as well as tortuosity of the descending thoracic aorta.  Compared to the prior, there has been change in position of cardiac pacing device on the left chest wall. Two cardiac leads in place.  No visualized pneumothorax or pleural effusion. No confluent airspace disease. Lung volumes are low, with accentuation of the interstitial markings.  No displaced fracture.  Osteopenia.  IMPRESSION: No radiographic evidence of acute cardiopulmonary disease.  Atherosclerosis.  In the interval since 08/13/2012 there is  either a new cardiac pacing device generator, or the generator has turned 180 degrees. Correlate with a history of prior recent surgery. Two cardiac leads remain in position, unchanged.  Signed,  Yvone Neu. Loreta Ave, DO  [facility]  [facility]   Electronically Signed   By: Gilmer Mor D.O.   On: 06/09/2014 13:51  EKG: Independently reviewed. Atrial fibrillation, ventricular rate control, no acute ST-T wave changes.  Assessment/Plan   1. Dehydration-he appears to have had poor by mouth intake in the last 24-48 hours as he was on the floor at least for the last 24 hours. We will give him intravenous fluids and watch for fluid overload as he does have a history of diastolic dysfunction and heart failure. Monitor electrolytes closely. 2. Mild rhabdomyolysis-CPK is 1141, will follow. We will ask physical therapy to evaluate 3. Chronic atrial fibrillation on warfarin therapy. 4. Hypertension, will monitor closely.  Further recommendations will depend on patient's hospital progress   Code Status: Full code  DVT Prophylaxis: Warfarin  Family Communication: I discussed the plan with the patient and patient's son at the bedside.   Disposition Plan: Home when medically stable.   Time spent: 60 minutes  Fremont Medical CenterGOSRANI,Daviona Herbert C [facility] Pager [phone]

## 2014-06-09 NOTE — Progress Notes (Signed)
ANTICOAGULATION CONSULT NOTE - Initial Consult  Pharmacy Consult for Coumadin Indication: atrial fibrillation  Allergies  Allergen Reactions  . Iohexol      Desc: PT STATES THROAT/FACE SWELLING W/ IVP DYE IN 1990. PT HAS NOT HAD ANY EXAMS DONE W/DYE SINCE AND HAS NEVER BEEN PRE MEDICATED. PER PT, Onset Date: [id number]   . Dilantin [Phenytoin Sodium Extended] Rash  . Tegretol [Carbamazepine] Rash    Patient Measurements: Height: 5' 10.5" (179.1 cm) Weight: 180 lb (81.647 kg) IBW/kg (Calculated) : 74.15  Vital Signs: Temp: 97.8 F (36.6 C) (12/24 1301) Temp Source: Oral (12/24 1301) BP: 123/79 mmHg (12/24 1530) Pulse Rate: 81 (12/24 1418)  Labs:  Recent Labs  06/09/14 1312  HGB 15.5  HCT 45.2  PLT 300  LABPROT 35.3*  INR 3.49*  CREATININE 1.29  CKTOTAL 1141*  TROPONINI <0.03    Estimated Creatinine Clearance: 47.9 mL/min (by C-G formula based on Cr of 1.29).   Medical History: Past Medical History  Diagnosis Date  . Heart disease   . Atrial fibrillation     WITH PACEMAKER  . OSA (obstructive sleep apnea)   . Memory loss   . Bleeding ulcer   . B12 deficiency   . Hypertension   . Abnormality of gait 05/03/2013  . Essential and other specified forms of tremor 05/03/2013  . Polyneuropathy in other diseases classified elsewhere 05/03/2013  . Peripheral edema   . Varicose veins     Bilateral  . Rib fracture   . Macular degeneration of left eye     Medications:   (Not in a hospital admission)   Coumadin dose per outpt clinical records:  2.5mg  po daily except 5mg  on Mon  Assessment: 80 yo M on chronic Coumadin for PAF.  Home dose listed above.  INR supra-therapeutic on admission.  No bleeding reported.   Goal of Therapy:  INR 2-3   Plan:  Hold Coumadin today. Daily INR  Elson ClanLilliston, Kamsiyochukwu Buist Michelle 06/09/2014,4:57 PM

## 2014-06-09 NOTE — ED Provider Notes (Signed)
CSN: [id number]     Arrival date & time 06/09/14  1259 History  This chart was scribed for Jon LennertJoseph L Sunny Gains, MD by Annye AsaAnna Dorsett, ED Scribe. This patient was seen in room APA08/APA08 and the patient's care was started at 1:32 PM.    Chief Complaint  Patient presents with  . Knee Pain   Patient is a 80 y.o. male presenting with knee pain and weakness. The history is provided by the patient and a relative (family complains of confusion; patient complains of back., neck, and knee pain).  Knee Pain Location:  Knee Time since incident:  1 week Pain details:    Quality:  Aching   Radiates to:  Does not radiate   Severity:  Mild   Onset quality:  Sudden   Duration:  1 week   Timing:  Constant   Progression:  Unchanged Chronicity:  New Foreign body present:  No foreign bodies Prior injury to area:  Unable to specify Relieved by:  None tried Worsened by:  Nothing tried Ineffective treatments:  Rest Associated symptoms: back pain and neck pain   Associated symptoms: no fatigue   Weakness This is a new problem. The current episode started yesterday. The problem occurs constantly. The problem has not changed since onset.Pertinent negatives include no chest pain, no abdominal pain and no headaches. Nothing aggravates the symptoms. Nothing relieves the symptoms. He has tried nothing for the symptoms.    HPI Comments: Jon [name] is a 80 y.o. male who presents to the Emergency Department complaining of generalized weakness. Son reports that patient fell out of his recliner yesterday and family was unable to get him off the floor; son explains that patient has recently been experiencing confusion, weakness.   Patient reports that he is experiencing back, neck, and knee pain; he reports a fall 1 week PTA. He attempted to schedule an appointment for these issues but was unable to be seen soon.  Past Medical History  Diagnosis Date  . Heart disease   . Atrial fibrillation     WITH PACEMAKER   . OSA (obstructive sleep apnea)   . Memory loss   . Bleeding ulcer   . B12 deficiency   . Hypertension   . Abnormality of gait 05/03/2013  . Essential and other specified forms of tremor 05/03/2013  . Polyneuropathy in other diseases classified elsewhere 05/03/2013  . Peripheral edema   . Varicose veins     Bilateral  . Rib fracture   . Macular degeneration of left eye    Past Surgical History  Procedure Laterality Date  . Pacemaker insertion  2006  . Meningioma removal  1992    POST RT FRONTAL   . Cataract extraction Bilateral 2013  . Permanent pacemaker generator change N/A 08/13/2012    Procedure: PERMANENT PACEMAKER GENERATOR CHANGE;  Surgeon: Thurmon FairMihai Croitoru, MD;  Location: [facility] CATH [facility];  Service: Cardiovascular;  Laterality: N/A;   Family History  Problem Relation Age of Onset  . Aortic aneurysm Mother   . Parkinsonism Mother   . Heart disease Father   . Cancer Sister     esaphageal cancer   History  Substance Use Topics  . Smoking status: Former Smoker    Types: Cigarettes  . Smokeless tobacco: Never Used     Comment: QUIT 35 YRS AGO  . Alcohol Use: No    Review of Systems  Constitutional: Negative for appetite change and fatigue.  HENT: Negative for congestion, ear discharge and sinus pressure.  Eyes: Negative for discharge.  Respiratory: Negative for cough.   Cardiovascular: Negative for chest pain.  Gastrointestinal: Negative for abdominal pain and diarrhea.  Genitourinary: Negative for frequency and hematuria.  Musculoskeletal: Positive for back pain, arthralgias and neck pain.  Skin: Negative for rash.  Neurological: Positive for weakness. Negative for seizures and headaches.  Psychiatric/Behavioral: Positive for confusion. Negative for hallucinations.   Allergies  Iohexol; Dilantin; and Tegretol  Home Medications   Prior to Admission medications   Medication Sig Start Date End Date Taking? Authorizing Provider  ALPRAZolam Prudy Feeler(XANAX) 1 MG tablet  Take 1 mg by mouth at bedtime as needed for sleep.    Historical Provider, MD  clobetasol cream (TEMOVATE) 0.05 % Apply 0.05 application topically. 02/10/13   Historical Provider, MD  diltiazem (CARDIZEM CD) 120 MG 24 hr capsule TAKE ONE CAPSULE BY MOUTH ONCE DAILY. 09/16/13   Mihai Croitoru, MD  fexofenadine (ALLEGRA) 180 MG tablet Take 180 mg by mouth daily.    Historical Provider, MD  furosemide (LASIX) 40 MG tablet Take 1 tablet (40 mg total) by mouth daily as needed (for swelling in legs). 05/09/14   Mihai Croitoru, MD  HYDROcodone-acetaminophen (NORCO) 10-325 MG per tablet Take 1 tablet by mouth every 6 (six) hours as needed for pain.    Historical Provider, MD  ketoconazole (NIZORAL) 2 % cream Apply 2 application topically. 02/18/13   Historical Provider, MD  lisinopril (PRINIVIL,ZESTRIL) 20 MG tablet TAKE ONE TABLET BY MOUTH ONCE DAILY.    Mihai Croitoru, MD  omeprazole (PRILOSEC) 20 MG capsule Take 20 mg by mouth daily.    Historical Provider, MD  Polyethyl Glycol-Propyl Glycol (SYSTANE OP) Place 1 drop into both eyes daily as needed (for dry eyes).    Historical Provider, MD  potassium chloride (K-DUR,KLOR-CON) 10 MEQ tablet Take 1 tablet (10 mEq total) by mouth daily as needed (takes with lasix). 05/09/14   Mihai Croitoru, MD  predniSONE (DELTASONE) 5 MG tablet Take 5 mg by mouth daily as needed (for eczema on hand).    Historical Provider, MD  sotalol (BETAPACE) 160 MG tablet TAKE 1 TABLET BY MOUTH TWICE DAILY.    Mihai Croitoru, MD  vitamin B-12 (CYANOCOBALAMIN) 1000 MCG tablet Take 1,000 mcg by mouth daily.    Historical Provider, MD  warfarin (COUMADIN) 2.5 MG tablet Take 1-2 tablets by mouth daily as directed 03/11/14   Phillips HayKristin Alvstad, RPH-CPP   BP 122/82 mmHg  Pulse 93  Temp(Src) 97.8 F (36.6 C) (Oral)  Resp 20  Ht 5' 10.5" (1.791 m)  Wt 180 lb (81.647 kg)  BMI 25.45 kg/m2  SpO2 93% Physical Exam  Constitutional: He appears well-developed.  HENT:  Head: Normocephalic.  MM  dry  Eyes: Conjunctivae and EOM are normal. No scleral icterus.  Neck: Neck supple. No thyromegaly present.  Cardiovascular: Normal rate and regular rhythm.  Exam reveals no gallop and no friction rub.   No murmur heard. Pulmonary/Chest: No stridor. He has no wheezes. He has no rales. He exhibits no tenderness.  Abdominal: He exhibits no distension. There is tenderness (Minimal diffuse abdominal tenderness). There is no rebound.  Musculoskeletal: Normal range of motion. He exhibits no edema.  Tenderness to lumbar spine and right upper back  Lymphadenopathy:    He has no cervical adenopathy.  Neurological: He is alert. He exhibits normal muscle tone. Coordination normal.  Mildly confused  Skin: No rash noted. No erythema.  Psychiatric: He has a normal mood and affect. His behavior is normal.  ED Course  Procedures   DIAGNOSTIC STUDIES: Oxygen Saturation is 93% on RA, low by my interpretation.    COORDINATION OF CARE: 1:37 PM Discussed treatment plan with pt at bedside and pt agreed to plan.  Labs Review Labs Reviewed  COMPREHENSIVE METABOLIC PANEL  TROPONIN I  CBC WITH DIFFERENTIAL  PROTIME-INR  LACTIC ACID, PLASMA   Imaging Review No results found.   EKG Interpretation   Date/Time:  Thursday June 09 2014 13:39:44 EST Ventricular Rate:  74 PR Interval:    QRS Duration: 115 QT Interval:  443 QTC Calculation: 491 R Axis:   28 Text Interpretation:  Atrial fibrillation Nonspecific intraventricular  conduction delay Probable inferior infarct, age indeterminate Confirmed by  Cowan Pilar  MD, Allanah Mcfarland [phone]) on 06/09/2014 1:43:11 PM      MDM   Final diagnoses:  None    The chart was scribed for me under my direct supervision.  I personally performed the history, physical, and medical decision making and all procedures in the evaluation of this patient.Jon Lennert, MD 06/09/14 [phone]

## 2014-06-09 NOTE — ED Notes (Signed)
Pt lives with family, has been sitting in a recliner for 6 days per family due to knee pain. He "slipped out of his chair" today "so family called EMS." Has decubitus and was urinating on self at home. Last vitals for EMS 138/82, 96 O2, hr 90, CBG 131

## 2014-06-10 ENCOUNTER — Inpatient Hospital Stay (HOSPITAL_COMMUNITY): Payer: Medicare Other

## 2014-06-10 DIAGNOSIS — I482 Chronic atrial fibrillation: Secondary | ICD-10-CM

## 2014-06-10 LAB — COMPREHENSIVE METABOLIC PANEL
ALBUMIN: 2.6 g/dL — AB (ref 3.5–5.2)
ALT: 32 U/L (ref 0–53)
ANION GAP: 8 (ref 5–15)
AST: 54 U/L — ABNORMAL HIGH (ref 0–37)
Alkaline Phosphatase: 78 U/L (ref 39–117)
BILIRUBIN TOTAL: 1.2 mg/dL (ref 0.3–1.2)
BUN: 48 mg/dL — AB (ref 6–23)
CALCIUM: 8.5 mg/dL (ref 8.4–10.5)
CO2: 25 mmol/L (ref 19–32)
CREATININE: 0.76 mg/dL (ref 0.50–1.35)
Chloride: 102 mEq/L (ref 96–112)
GFR calc Af Amer: >90 mL/min (ref >90)
GFR calc non Af Amer: 84 mL/min — ABNORMAL LOW (ref >90)
Glucose, Bld: 99 mg/dL (ref 70–99)
Potassium: 3.8 mmol/L (ref 3.5–5.1)
Sodium: 135 mmol/L (ref 135–145)
TOTAL PROTEIN: 5.6 g/dL — AB (ref 6.0–8.3)

## 2014-06-10 LAB — CBC
HCT: 39.2 % (ref 39.0–52.0)
HEMOGLOBIN: 13.2 g/dL (ref 13.0–17.0)
MCH: 29.1 pg (ref 26.0–34.0)
MCHC: 33.7 g/dL (ref 30.0–36.0)
MCV: 86.5 fL (ref 78.0–100.0)
Platelets: 258 10*3/uL (ref 150–400)
RBC: 4.53 MIL/uL (ref 4.22–5.81)
RDW: 14.9 % (ref 11.5–15.5)
WBC: 10.3 10*3/uL (ref 4.0–10.5)

## 2014-06-10 LAB — TROPONIN I

## 2014-06-10 LAB — CK: Total CK: 626 U/L — ABNORMAL HIGH (ref 7–232)

## 2014-06-10 LAB — PROTIME-INR
INR: 3.39 — ABNORMAL HIGH (ref 0.00–1.49)
Prothrombin Time: 34.5 seconds — ABNORMAL HIGH (ref 11.6–15.2)

## 2014-06-10 MED ORDER — POLYETHYLENE GLYCOL 3350 17 G PO PACK
17.0000 g | PACK | Freq: Every day | ORAL | Status: DC
  Administered 2014-06-10: 17 g via ORAL
  Filled 2014-06-10: qty 1

## 2014-06-10 NOTE — Progress Notes (Signed)
[facility] PROGRESS NOTE  Jon CutterWilliam P [name] ZOX:[id number] DOB: 04-03-1932 DOA: 06/09/2014 PCP: Milana ObeyKNOWLTON,STEPHEN D, MD  Assessment/Plan: 1. Dehydration. Likely related to decreased by mouth intake. Improved with IV fluids. 2. Mild rhabdomyolysis. CPK is trending down. 3. Chronic atrial fibrillation. On diltiazem, sotalol. Also on anticoagulation with warfarin. 4. History of hypertension. Blood pressure currently running low and therefore lisinopril and diltiazem have been put on hold. These are as follows blood pressures are stabilizing. 5. Chronic diastolic congestive heart failure. Appears compensated at this time. 6. Right knee pain. Likely related to degenerative disease. Will check x-ray since patient is having significant pain  Code Status: Full code Family Communication: Discussed with patient and son at the bedside Disposition Plan: Discharge home, possibly in a.m.   Consultants:    Procedures:    Antibiotics:    HPI/Subjective: Feeling better today. Weakness improving. No shortness of breath  Objective: Filed Vitals:   06/10/14 1607  BP: 83/53  Pulse: 60  Temp: 97.7 F (36.5 C)  Resp: 20    Intake/Output Summary (Last 24 hours) at 06/10/14 1900 Last data filed at 06/10/14 1836  Gross per 24 hour  Intake 3603.33 ml  Output   1500 ml  Net 2103.33 ml   Filed Weights   06/09/14 1301 06/09/14 1717  Weight: 81.647 kg (180 lb) 81.647 kg (180 lb)    Exam:   General:  NAD  Cardiovascular: s1. s2 irregular  Respiratory: cta b  Abdomen: soft, distended, bs+  Musculoskeletal: no edema b/l   Data Reviewed: Basic Metabolic Panel:  Recent Labs Lab 06/09/14 1312 06/10/14 0534  NA 134* 135  K 4.2 3.8  CL 98 102  CO2 25 25  GLUCOSE 126* 99  BUN 70* 48*  CREATININE 1.29 0.76  CALCIUM 9.4 8.5   Liver Function Tests:  Recent Labs Lab 06/09/14 1312 06/10/14 0534  AST 68* 54*  ALT 35 32  ALKPHOS 100 78  BILITOT 1.4* 1.2  PROT 6.9  5.6*  ALBUMIN 3.2* 2.6*   No results for input(s): LIPASE, AMYLASE in the last 168 hours. No results for input(s): AMMONIA in the last 168 hours. CBC:  Recent Labs Lab 06/09/14 1312 06/10/14 0534  WBC 12.6* 10.3  NEUTROABS 11.0*  --   HGB 15.5 13.2  HCT 45.2 39.2  MCV 85.6 86.5  PLT 300 258   Cardiac Enzymes:  Recent Labs Lab 06/09/14 1312 06/09/14 1709 06/09/14 2239 06/10/14 0534  CKTOTAL 1141*  --   --  626*  TROPONINI <0.03 <0.03 <0.03 <0.03   BNP (last 3 results) No results for input(s): PROBNP in the last 8760 hours. CBG: No results for input(s): GLUCAP in the last 168 hours.  No results found for this or any previous visit (from the past 240 hour(s)).   Studies: Dg Lumbar Spine Complete  06/09/2014   CLINICAL DATA:  Fall out of chair with back pain, initial encounter  EXAM: LUMBAR SPINE - COMPLETE 4+ VIEW  COMPARISON:  None.  FINDINGS: Multilevel degenerative changes are seen. No compression deformities are noted. Diffuse aortic calcifications are seen. No anterolisthesis is seen. The large and small bowel filled with gas although no true obstructive changes are seen. Fecal material is noted within the colon.  IMPRESSION: Multilevel degenerative change without acute abnormality.   Electronically Signed   By: Alcide CleverMark  Lukens M.D.   On: 06/09/2014 14:18   Dg Pelvis 1-2 Views  06/09/2014   CLINICAL DATA:  Larey SeatFell out of chair today with pelvic pain,  initial encounter  EXAM: PELVIS - 1-2 VIEW  COMPARISON:  None.  FINDINGS: The pelvic ring is intact. Mild degenerative changes of the sacroiliac joints and hip joints are noted. No acute fracture or dislocation is seen. No gross soft tissue abnormality is noted.  IMPRESSION: No acute abnormality noted.   Electronically Signed   By: Alcide CleverMark  Lukens M.D.   On: 06/09/2014 14:19   Ct Head Wo Contrast  06/09/2014   CLINICAL DATA:  Weakness, slipped out of his chair today  EXAM: CT HEAD WITHOUT CONTRAST  TECHNIQUE: Contiguous axial  images were obtained from the base of the skull through the vertex without intravenous contrast.  COMPARISON:  05/07/2008  FINDINGS: Again noted status post right frontal craniotomy.  There is mucosal thickening with complete opacification of the right maxillary sinus. Almost complete opacification of left maxillary sinus. The mastoid air cells are unremarkable.  No intracranial hemorrhage, mass effect or midline shift. Mild cerebral atrophy. Ventricular size is stable from prior exam. Stable periventricular and patchy subcortical chronic white matter disease. No acute cortical infarction. No mass lesion is noted on this unenhanced scan.  IMPRESSION: No acute intracranial abnormality. Again noted status post right frontal craniotomy. Stable atrophy and chronic white matter disease. No definite acute cortical infarction.   Electronically Signed   By: Natasha MeadLiviu  Pop M.D.   On: 06/09/2014 14:25   Dg Chest Port 1 View  06/09/2014   CLINICAL DATA:  80 year old male with a history of fall. Incontinence.  EXAM: PORTABLE CHEST - 1 VIEW  COMPARISON:  08/13/2012  FINDINGS: Cardiomediastinal silhouette unchanged in size and contour. Re- demonstration of atherosclerotic calcifications of aortic arch, as well as tortuosity of the descending thoracic aorta.  Compared to the prior, there has been change in position of cardiac pacing device on the left chest wall. Two cardiac leads in place.  No visualized pneumothorax or pleural effusion. No confluent airspace disease. Lung volumes are low, with accentuation of the interstitial markings.  No displaced fracture.  Osteopenia.  IMPRESSION: No radiographic evidence of acute cardiopulmonary disease.  Atherosclerosis.  In the interval since 08/13/2012 there is either a new cardiac pacing device generator, or the generator has turned 180 degrees. Correlate with a history of prior recent surgery. Two cardiac leads remain in position, unchanged.  Signed,  Yvone NeuJaime S. Loreta AveWagner, DO  [facility]  [facility]   Electronically Signed   By: Gilmer MorJaime  Wagner D.O.   On: 06/09/2014 13:51    Scheduled Meds: . clobetasol ointment   Topical BID  . ketoconazole  1 application Topical BID  . loratadine  10 mg Oral Daily  . pantoprazole  40 mg Oral Daily  . sodium chloride  3 mL Intravenous Q12H  . sotalol  160 mg Oral BID  . vitamin B-12  1,000 mcg Oral Daily  . Warfarin - Pharmacist Dosing Inpatient   Does not apply Q24H   Continuous Infusions: . sodium chloride 100 mL/hr at 06/10/14 1610    Active Problems:   Atrial fibrillation   Long term current use of anticoagulant therapy   Dehydration   Hypertension    Time spent: 30mins    MEMON,JEHANZEB  [facility] Pager [phone]. If 7PM-7AM, please contact night-coverage at [email or web address], password [email or web address] 06/10/2014, 7:00 PM  LOS: 1 day

## 2014-06-10 NOTE — Progress Notes (Signed)
ANTICOAGULATION CONSULT NOTE  Pharmacy Consult for Coumadin Indication: atrial fibrillation  Allergies  Allergen Reactions  . Iohexol      Desc: PT STATES THROAT/FACE SWELLING W/ IVP DYE IN 1990. PT HAS NOT HAD ANY EXAMS DONE W/DYE SINCE AND HAS NEVER BEEN PRE MEDICATED. PER PT, Onset Date: [id number]   . Dilantin [Phenytoin Sodium Extended] Rash  . Tegretol [Carbamazepine] Rash    Patient Measurements: Height: 5' 10.5" (179.1 cm) Weight: 180 lb (81.647 kg) IBW/kg (Calculated) : 74.15  Vital Signs: Temp: 97.8 F (36.6 C) (12/25 0929) Temp Source: Oral (12/25 0929) BP: 102/62 mmHg (12/25 0929) Pulse Rate: 74 (12/25 0929)  Labs:  Recent Labs  06/09/14 1312 06/09/14 1709 06/09/14 2239 06/10/14 0534  HGB 15.5  --   --  13.2  HCT 45.2  --   --  39.2  PLT 300  --   --  258  LABPROT 35.3*  --   --  34.5*  INR 3.49*  --   --  3.39*  CREATININE 1.29  --   --  0.76  CKTOTAL 1141*  --   --  626*  TROPONINI <0.03 <0.03 <0.03 <0.03    Estimated Creatinine Clearance: 77.3 mL/min (by C-G formula based on Cr of 0.76).   Medical History: Past Medical History  Diagnosis Date  . Heart disease   . Atrial fibrillation     WITH PACEMAKER  . OSA (obstructive sleep apnea)   . Memory loss   . Bleeding ulcer   . B12 deficiency   . Hypertension   . Abnormality of gait 05/03/2013  . Essential and other specified forms of tremor 05/03/2013  . Polyneuropathy in other diseases classified elsewhere 05/03/2013  . Peripheral edema   . Varicose veins     Bilateral  . Rib fracture   . Macular degeneration of left eye     Medications:  Prescriptions prior to admission  Medication Sig Dispense Refill Last Dose  . diltiazem (CARDIZEM CD) 120 MG 24 hr capsule TAKE ONE CAPSULE BY MOUTH ONCE DAILY. 30 capsule 11 06/08/2014 at Unknown time  . fexofenadine (ALLEGRA) 180 MG tablet Take 180 mg by mouth daily.   06/08/2014 at Unknown time  . lisinopril (PRINIVIL,ZESTRIL) 20 MG tablet TAKE ONE  TABLET BY MOUTH ONCE DAILY. 30 tablet 11 06/08/2014 at Unknown time  . omeprazole (PRILOSEC) 20 MG capsule Take 20 mg by mouth daily.   06/08/2014 at Unknown time  . sotalol (BETAPACE) 160 MG tablet TAKE 1 TABLET BY MOUTH TWICE DAILY. 60 tablet 11 06/08/2014 at Unknown time  . warfarin (COUMADIN) 2.5 MG tablet Take 1-2 tablets by mouth daily as directed 40 tablet 6 06/08/2014 at Unknown time  . ALPRAZolam (XANAX) 1 MG tablet Take 1 mg by mouth at bedtime as needed for sleep.   Taking  . clobetasol cream (TEMOVATE) 0.05 % Apply 0.05 application topically.   Taking  . furosemide (LASIX) 40 MG tablet Take 1 tablet (40 mg total) by mouth daily as needed (for swelling in legs). 30 tablet 4   . HYDROcodone-acetaminophen (NORCO) 10-325 MG per tablet Take 1 tablet by mouth every 6 (six) hours as needed for pain.   Taking  . ketoconazole (NIZORAL) 2 % cream Apply 2 application topically.   Taking  . Polyethyl Glycol-Propyl Glycol (SYSTANE OP) Place 1 drop into both eyes daily as needed (for dry eyes).   Taking  . potassium chloride (K-DUR,KLOR-CON) 10 MEQ tablet Take 1 tablet (10 mEq total) by mouth  daily as needed (takes with lasix). 30 tablet 4   . predniSONE (DELTASONE) 5 MG tablet Take 5 mg by mouth daily as needed (for eczema on hand).   Taking  . vitamin B-12 (CYANOCOBALAMIN) 1000 MCG tablet Take 1,000 mcg by mouth daily.   Taking     Coumadin dose per outpt clinical records:  2.5mg  po daily except 5mg  on Mon  Assessment: 80 yo M on chronic Coumadin for PAF.  Home dose listed above.  INR supra-therapeutic on admission & remains elevated today.  No bleeding reported.   Goal of Therapy:  INR 2-3   Plan:  Hold Coumadin today. Daily INR  Denis Koppel, Mercy Ridingndrea Michelle 06/10/2014,9:40 AM

## 2014-06-11 DIAGNOSIS — M25569 Pain in unspecified knee: Secondary | ICD-10-CM | POA: Insufficient documentation

## 2014-06-11 DIAGNOSIS — M25561 Pain in right knee: Secondary | ICD-10-CM

## 2014-06-11 LAB — PROTIME-INR
INR: 3.82 — AB (ref 0.00–1.49)
Prothrombin Time: 37.9 seconds — ABNORMAL HIGH (ref 11.6–15.2)

## 2014-06-11 LAB — BASIC METABOLIC PANEL
ANION GAP: 6 (ref 5–15)
BUN: 35 mg/dL — ABNORMAL HIGH (ref 6–23)
CO2: 24 mmol/L (ref 19–32)
Calcium: 8.2 mg/dL — ABNORMAL LOW (ref 8.4–10.5)
Chloride: 101 mEq/L (ref 96–112)
Creatinine, Ser: 0.86 mg/dL (ref 0.50–1.35)
GFR calc Af Amer: >90 mL/min (ref >90)
GFR, EST NON AFRICAN AMERICAN: 80 mL/min — AB (ref >90)
GLUCOSE: 98 mg/dL (ref 70–99)
POTASSIUM: 3.8 mmol/L (ref 3.5–5.1)
SODIUM: 131 mmol/L — AB (ref 135–145)

## 2014-06-11 MED ORDER — FUROSEMIDE 20 MG PO TABS: 20.0000 mg | ORAL_TABLET | Freq: Every day | ORAL | Status: DC

## 2014-06-11 NOTE — Progress Notes (Signed)
Patient was discharged home with son today.  Patient and son given discharge instructions, prescriptions, and care notes.  Patient verbalized understanding with no complaints or concerns voiced at this time.  IV was removed with catheter intact, no bleeding or complications.  Patient left unit in stable condition in a wheelchair, by staff member.

## 2014-06-11 NOTE — Progress Notes (Signed)
ANTICOAGULATION CONSULT NOTE  Pharmacy Consult for Coumadin Indication: atrial fibrillation  Allergies  Allergen Reactions  . Iohexol      Desc: PT STATES THROAT/FACE SWELLING W/ IVP DYE IN 1990. PT HAS NOT HAD ANY EXAMS DONE W/DYE SINCE AND HAS NEVER BEEN PRE MEDICATED. PER PT, Onset Date: [id number]   . Dilantin [Phenytoin Sodium Extended] Rash  . Tegretol [Carbamazepine] Rash    Patient Measurements: Height: 5' 10.5" (179.1 cm) Weight: 180 lb (81.647 kg) IBW/kg (Calculated) : 74.15  Vital Signs: Temp: 98.2 F (36.8 C) (12/26 0634) Temp Source: Oral (12/26 0634) BP: 108/72 mmHg (12/26 0634) Pulse Rate: 51 (12/26 0634)  Labs:  Recent Labs  06/09/14 1312 06/09/14 1709 06/09/14 2239 06/10/14 0534 06/11/14 0639  HGB 15.5  --   --  13.2  --   HCT 45.2  --   --  39.2  --   PLT 300  --   --  258  --   LABPROT 35.3*  --   --  34.5* 37.9*  INR 3.49*  --   --  3.39* 3.82*  CREATININE 1.29  --   --  0.76 0.86  CKTOTAL 1141*  --   --  626*  --   TROPONINI <0.03 <0.03 <0.03 <0.03  --     Estimated Creatinine Clearance: 71.9 mL/min (by C-G formula based on Cr of 0.86).   Medical History: Past Medical History  Diagnosis Date  . Heart disease   . Atrial fibrillation     WITH PACEMAKER  . OSA (obstructive sleep apnea)   . Memory loss   . Bleeding ulcer   . B12 deficiency   . Hypertension   . Abnormality of gait 05/03/2013  . Essential and other specified forms of tremor 05/03/2013  . Polyneuropathy in other diseases classified elsewhere 05/03/2013  . Peripheral edema   . Varicose veins     Bilateral  . Rib fracture   . Macular degeneration of left eye     Medications:  Prescriptions prior to admission  Medication Sig Dispense Refill Last Dose  . diltiazem (CARDIZEM CD) 120 MG 24 hr capsule TAKE ONE CAPSULE BY MOUTH ONCE DAILY. 30 capsule 11 06/08/2014 at Unknown time  . fexofenadine (ALLEGRA) 180 MG tablet Take 180 mg by mouth daily.   06/08/2014 at Unknown  time  . lisinopril (PRINIVIL,ZESTRIL) 20 MG tablet TAKE ONE TABLET BY MOUTH ONCE DAILY. 30 tablet 11 06/08/2014 at Unknown time  . omeprazole (PRILOSEC) 20 MG capsule Take 20 mg by mouth daily.   06/08/2014 at Unknown time  . sotalol (BETAPACE) 160 MG tablet TAKE 1 TABLET BY MOUTH TWICE DAILY. 60 tablet 11 06/08/2014 at Unknown time  . warfarin (COUMADIN) 2.5 MG tablet Take 1-2 tablets by mouth daily as directed 40 tablet 6 06/08/2014 at Unknown time  . ALPRAZolam (XANAX) 1 MG tablet Take 1 mg by mouth at bedtime as needed for sleep.   Taking  . clobetasol cream (TEMOVATE) 0.05 % Apply 0.05 application topically.   Taking  . furosemide (LASIX) 40 MG tablet Take 1 tablet (40 mg total) by mouth daily as needed (for swelling in legs). 30 tablet 4   . HYDROcodone-acetaminophen (NORCO) 10-325 MG per tablet Take 1 tablet by mouth every 6 (six) hours as needed for pain.   Taking  . ketoconazole (NIZORAL) 2 % cream Apply 2 application topically.   Taking  . Polyethyl Glycol-Propyl Glycol (SYSTANE OP) Place 1 drop into both eyes daily as needed (for dry  eyes).   Taking  . potassium chloride (K-DUR,KLOR-CON) 10 MEQ tablet Take 1 tablet (10 mEq total) by mouth daily as needed (takes with lasix). 30 tablet 4   . predniSONE (DELTASONE) 5 MG tablet Take 5 mg by mouth daily as needed (for eczema on hand).   Taking  . vitamin B-12 (CYANOCOBALAMIN) 1000 MCG tablet Take 1,000 mcg by mouth daily.   Taking     Coumadin dose per outpt clinical records:  2.5mg  po daily except 5mg  on Mon  Assessment: 80 yo M on chronic Coumadin for PAF.  Home dose listed above.  INR supra-therapeutic on admission & remains elevated today.  No bleeding reported.   Goal of Therapy:  INR 2-3   Plan:  Hold Coumadin today. Daily INR  Elyse Prevo, Mercy RidingAndrea Michelle 06/11/2014,8:38 AM

## 2014-06-11 NOTE — Progress Notes (Addendum)
Per Dr. Benetta SparMemon's request, Patient ambulated 100 ft in the hall with a walker and standby assist, tolerated well.

## 2014-06-12 NOTE — Discharge Summary (Signed)
Physician Discharge Summary  Jon [name] ZOX:[id number] DOB: Jun 18, 1931 DOA: 06/09/2014  PCP: Milana Obey, MD  Admit date: 06/09/2014 Discharge date: 06/11/2014  Time spent: 40 minutes  Recommendations for Outpatient Follow-up:  1. Patient has been set up with home health services 2. Follow up with primary care physician in 1-2 weeks 3. Lisinopril has been discontinued due to low blood pressures  Discharge Diagnoses:  Active Problems:   Atrial fibrillation   Long term current use of anticoagulant therapy   Dehydration   Hypertension   Knee pain Mild rhabdomyolysis  Discharge Condition: improved  Diet recommendation: low salt  Filed Weights   06/09/14 1301 06/09/14 1717  Weight: 81.647 kg (180 lb) 81.647 kg (180 lb)    History of present illness:  This patient was admitted to the hospital with a one week history of generalized weakness. He had fallen to the ground at home and may have been on the ground for a prolonged period of time. He was mildly confused and appeared to be dehydrated. He was admitted for further treatment  Hospital Course:  Patient was adequately hydrated with IV fluids. His blood pressures were running low, so his lisinopril has been held. With hydration, his CK levels were trending down. His generalized weakness has significantly improved and he is now able to ambulate with the help of a walker. He does have degenerative disease in his legs which limits his mobility. He is now feeling improved. With hydration, he did develop some peripheral edema and has been started on low dose lasix. He has been told to keep himself hydrated. He has been set up with home health services. He feels ready for discharge home.  Procedures:    Consultations:    Discharge Exam: Filed Vitals:   06/11/14 1455  BP: 91/52  Pulse: 62  Temp: 97.6 F (36.4 C)  Resp: 20    General: NAD Cardiovascular: s1, s2 irregular Respiratory: cta b  Discharge  Instructions   Discharge Instructions    Call MD for:  extreme fatigue    Complete by:  As directed      Call MD for:  persistant dizziness or light-headedness    Complete by:  As directed      Diet - low sodium heart healthy    Complete by:  As directed      Increase activity slowly    Complete by:  As directed           Discharge Medication List as of 06/11/2014  5:07 PM    START taking these medications   Details  !! furosemide (LASIX) 20 MG tablet Take 1 tablet (20 mg total) by mouth daily., Starting 06/11/2014, Until Discontinued, Print     !! - Potential duplicate medications found. Please discuss with provider.    CONTINUE these medications which have NOT CHANGED   Details  ALPRAZolam (XANAX) 1 MG tablet Take 1 mg by mouth 4 (four) times daily as needed for anxiety or sleep. , Until Discontinued, Historical Med    diltiazem (CARDIZEM CD) 120 MG 24 hr capsule TAKE ONE CAPSULE BY MOUTH ONCE DAILY., Normal    fexofenadine (ALLEGRA) 180 MG tablet Take 180 mg by mouth daily., Until Discontinued, Historical Med    !! furosemide (LASIX) 40 MG tablet Take 1 tablet (40 mg total) by mouth daily as needed (for swelling in legs)., Starting 05/09/2014, Until Discontinued, Normal    HYDROcodone-acetaminophen (NORCO) 10-325 MG per tablet Take 1 tablet by mouth 4 (four) times  daily as needed for severe pain. , Until Discontinued, Historical Med    omeprazole (PRILOSEC) 20 MG capsule Take 20 mg by mouth daily., Until Discontinued, Historical Med    oxyCODONE-acetaminophen (PERCOCET) 10-325 MG per tablet Take 1 tablet by mouth 4 (four) times daily., Until Discontinued, Historical Med    Polyethyl Glycol-Propyl Glycol (SYSTANE OP) Place 1 drop into both eyes daily as needed (for dry eyes)., Until Discontinued, Historical Med    potassium chloride (K-DUR,KLOR-CON) 10 MEQ tablet Take 1 tablet (10 mEq total) by mouth daily as needed (takes with lasix)., Starting 05/09/2014, Until  Discontinued, Normal    predniSONE (DELTASONE) 5 MG tablet Take 5 mg by mouth daily as needed (for eczema on hand)., Until Discontinued, Historical Med    sotalol (BETAPACE) 160 MG tablet TAKE 1 TABLET BY MOUTH TWICE DAILY., Normal    vitamin B-12 (CYANOCOBALAMIN) 1000 MCG tablet Take 1,000 mcg by mouth daily., Until Discontinued, Historical Med    warfarin (COUMADIN) 2.5 MG tablet Take 1-2 tablets by mouth daily as directed, Normal     !! - Potential duplicate medications found. Please discuss with provider.    STOP taking these medications     lisinopril (PRINIVIL,ZESTRIL) 20 MG tablet      ketoconazole (NIZORAL) 2 % cream        Allergies  Allergen Reactions  . Iohexol      Desc: PT STATES THROAT/FACE SWELLING W/ IVP DYE IN 1990. PT HAS NOT HAD ANY EXAMS DONE W/DYE SINCE AND HAS NEVER BEEN PRE MEDICATED. PER PT, Onset Date: [id number]   . Dilantin [Phenytoin Sodium Extended] Rash  . Tegretol [Carbamazepine] Rash   Follow-up Information    Follow up with Milana ObeyKNOWLTON,STEPHEN D, MD. Schedule an appointment as soon as possible for a visit in 2 weeks.   Specialty:  Family Medicine   Contact information:   [address] [place] [address] [phone]        The results of significant diagnostics from this hospitalization (including imaging, microbiology, ancillary and laboratory) are listed below for reference.    Significant Diagnostic Studies: Dg Lumbar Spine Complete  06/09/2014   CLINICAL DATA:  Fall out of chair with back pain, initial encounter  EXAM: LUMBAR SPINE - COMPLETE 4+ VIEW  COMPARISON:  None.  FINDINGS: Multilevel degenerative changes are seen. No compression deformities are noted. Diffuse aortic calcifications are seen. No anterolisthesis is seen. The large and small bowel filled with gas although no true obstructive changes are seen. Fecal material is noted within the colon.  IMPRESSION: Multilevel degenerative change without acute abnormality.    Electronically Signed   By: Alcide CleverMark  Lukens M.D.   On: 06/09/2014 14:18   Dg Pelvis 1-2 Views  06/09/2014   CLINICAL DATA:  Larey SeatFell out of chair today with pelvic pain, initial encounter  EXAM: PELVIS - 1-2 VIEW  COMPARISON:  None.  FINDINGS: The pelvic ring is intact. Mild degenerative changes of the sacroiliac joints and hip joints are noted. No acute fracture or dislocation is seen. No gross soft tissue abnormality is noted.  IMPRESSION: No acute abnormality noted.   Electronically Signed   By: Alcide CleverMark  Lukens M.D.   On: 06/09/2014 14:19   Ct Head Wo Contrast  06/09/2014   CLINICAL DATA:  Weakness, slipped out of his chair today  EXAM: CT HEAD WITHOUT CONTRAST  TECHNIQUE: Contiguous axial images were obtained from the base of the skull through the vertex without intravenous contrast.  COMPARISON:  05/07/2008  FINDINGS: Again  noted status post right frontal craniotomy.  There is mucosal thickening with complete opacification of the right maxillary sinus. Almost complete opacification of left maxillary sinus. The mastoid air cells are unremarkable.  No intracranial hemorrhage, mass effect or midline shift. Mild cerebral atrophy. Ventricular size is stable from prior exam. Stable periventricular and patchy subcortical chronic white matter disease. No acute cortical infarction. No mass lesion is noted on this unenhanced scan.  IMPRESSION: No acute intracranial abnormality. Again noted status post right frontal craniotomy. Stable atrophy and chronic white matter disease. No definite acute cortical infarction.   Electronically Signed   By: Natasha MeadLiviu  Pop M.D.   On: 06/09/2014 14:25   Dg Chest Port 1 View  06/09/2014   CLINICAL DATA:  [age] year old male with a history of fall. Incontinence.  EXAM: PORTABLE CHEST - 1 VIEW  COMPARISON:  08/13/2012  FINDINGS: Cardiomediastinal silhouette unchanged in size and contour. Re- demonstration of atherosclerotic calcifications of aortic arch, as well as tortuosity of the descending  thoracic aorta.  Compared to the prior, there has been change in position of cardiac pacing device on the left chest wall. Two cardiac leads in place.  No visualized pneumothorax or pleural effusion. No confluent airspace disease. Lung volumes are low, with accentuation of the interstitial markings.  No displaced fracture.  Osteopenia.  IMPRESSION: No radiographic evidence of acute cardiopulmonary disease.  Atherosclerosis.  In the interval since 08/13/2012 there is either a new cardiac pacing device generator, or the generator has turned 180 degrees. Correlate with a history of prior recent surgery. Two cardiac leads remain in position, unchanged.  Signed,  Yvone NeuJaime S. Loreta AveWagner, DO  [facility]  [facility]   Electronically Signed   By: Gilmer MorJaime  Wagner D.O.   On: 06/09/2014 13:51   Dg Knee Right Port  06/10/2014   CLINICAL DATA:  Knee pain  EXAM: PORTABLE RIGHT KNEE - 1-2 VIEW  COMPARISON:  None.  FINDINGS: Moderate joint effusion. Moderate tricompartment osteoarthritic change. Lucency through the superior aspect of the patella has a chronic appearance. Osteopenia.  IMPRESSION: Degenerative change.  Joint effusion.  Chronic changes.   Electronically Signed   By: Maryclare BeanArt  Hoss M.D.   On: 06/10/2014 20:33    Microbiology: No results found for this or any previous visit (from the past 240 hour(s)).   Labs: Basic Metabolic Panel:  Recent Labs Lab 06/09/14 1312 06/10/14 0534 06/11/14 0639  NA 134* 135 131*  K 4.2 3.8 3.8  CL 98 102 101  CO2 25 25 24   GLUCOSE 126* 99 98  BUN 70* 48* 35*  CREATININE 1.29 0.76 0.86  CALCIUM 9.4 8.5 8.2*   Liver Function Tests:  Recent Labs Lab 06/09/14 1312 06/10/14 0534  AST 68* 54*  ALT 35 32  ALKPHOS 100 78  BILITOT 1.4* 1.2  PROT 6.9 5.6*  ALBUMIN 3.2* 2.6*   No results for input(s): LIPASE, AMYLASE in the last 168 hours. No results for input(s): AMMONIA in the last 168 hours. CBC:  Recent Labs Lab  06/09/14 1312 06/10/14 0534  WBC 12.6* 10.3  NEUTROABS 11.0*  --   HGB 15.5 13.2  HCT 45.2 39.2  MCV 85.6 86.5  PLT 300 258   Cardiac Enzymes:  Recent Labs Lab 06/09/14 1312 06/09/14 1709 06/09/14 2239 06/10/14 0534  CKTOTAL 1141*  --   --  626*  TROPONINI <0.03 <0.03 <0.03 <0.03   BNP: BNP (last 3 results) No results for input(s): PROBNP in the last 8760 hours.  CBG: No results for input(s): GLUCAP in the last 168 hours.     Signed:  MEMON,JEHANZEB  [facility] 06/12/2014, 1:24 AM

## 2014-06-14 ENCOUNTER — Encounter (HOSPITAL_COMMUNITY): Payer: Self-pay | Admitting: Emergency Medicine

## 2014-06-14 ENCOUNTER — Inpatient Hospital Stay (HOSPITAL_COMMUNITY)
Admission: EM | Admit: 2014-06-14 | Discharge: 2014-06-18 | DRG: 872 | Disposition: A | Discharge status: Skilled Nursing Facility | Payer: Medicare Other | Attending: Internal Medicine | Admitting: Internal Medicine

## 2014-06-14 DIAGNOSIS — Y92019 Unspecified place in single-family (private) house as the place of occurrence of the external cause: Secondary | ICD-10-CM

## 2014-06-14 DIAGNOSIS — N179 Acute kidney failure, unspecified: Secondary | ICD-10-CM

## 2014-06-14 DIAGNOSIS — R0902 Hypoxemia: Secondary | ICD-10-CM | POA: Diagnosis present

## 2014-06-14 DIAGNOSIS — R531 Weakness: Secondary | ICD-10-CM | POA: Diagnosis not present

## 2014-06-14 DIAGNOSIS — Z950 Presence of cardiac pacemaker: Secondary | ICD-10-CM

## 2014-06-14 DIAGNOSIS — W19XXXA Unspecified fall, initial encounter: Secondary | ICD-10-CM | POA: Diagnosis present

## 2014-06-14 DIAGNOSIS — A419 Sepsis, unspecified organism: Secondary | ICD-10-CM | POA: Diagnosis not present

## 2014-06-14 DIAGNOSIS — E878 Other disorders of electrolyte and fluid balance, not elsewhere classified: Secondary | ICD-10-CM | POA: Diagnosis present

## 2014-06-14 DIAGNOSIS — L89892 Pressure ulcer of other site, stage 2: Secondary | ICD-10-CM | POA: Diagnosis present

## 2014-06-14 DIAGNOSIS — Z79891 Long term (current) use of opiate analgesic: Secondary | ICD-10-CM

## 2014-06-14 DIAGNOSIS — D649 Anemia, unspecified: Secondary | ICD-10-CM | POA: Diagnosis present

## 2014-06-14 DIAGNOSIS — I4891 Unspecified atrial fibrillation: Secondary | ICD-10-CM | POA: Diagnosis present

## 2014-06-14 DIAGNOSIS — R339 Retention of urine, unspecified: Secondary | ICD-10-CM

## 2014-06-14 DIAGNOSIS — N1330 Unspecified hydronephrosis: Secondary | ICD-10-CM

## 2014-06-14 DIAGNOSIS — Z8249 Family history of ischemic heart disease and other diseases of the circulatory system: Secondary | ICD-10-CM

## 2014-06-14 DIAGNOSIS — N390 Urinary tract infection, site not specified: Secondary | ICD-10-CM

## 2014-06-14 DIAGNOSIS — S2241XA Multiple fractures of ribs, right side, initial encounter for closed fracture: Secondary | ICD-10-CM | POA: Diagnosis present

## 2014-06-14 DIAGNOSIS — G4733 Obstructive sleep apnea (adult) (pediatric): Secondary | ICD-10-CM | POA: Diagnosis present

## 2014-06-14 DIAGNOSIS — R296 Repeated falls: Secondary | ICD-10-CM | POA: Diagnosis present

## 2014-06-14 DIAGNOSIS — Z820 Family history of epilepsy and other diseases of the nervous system: Secondary | ICD-10-CM

## 2014-06-14 DIAGNOSIS — S2249XA Multiple fractures of ribs, unspecified side, initial encounter for closed fracture: Secondary | ICD-10-CM

## 2014-06-14 DIAGNOSIS — G629 Polyneuropathy, unspecified: Secondary | ICD-10-CM | POA: Diagnosis present

## 2014-06-14 DIAGNOSIS — Z87891 Personal history of nicotine dependence: Secondary | ICD-10-CM

## 2014-06-14 DIAGNOSIS — Z9181 History of falling: Secondary | ICD-10-CM

## 2014-06-14 DIAGNOSIS — Z800 Family history of malignant neoplasm of digestive organs: Secondary | ICD-10-CM

## 2014-06-14 DIAGNOSIS — I10 Essential (primary) hypertension: Secondary | ICD-10-CM | POA: Diagnosis present

## 2014-06-14 DIAGNOSIS — E871 Hypo-osmolality and hyponatremia: Secondary | ICD-10-CM

## 2014-06-14 DIAGNOSIS — L89159 Pressure ulcer of sacral region, unspecified stage: Secondary | ICD-10-CM | POA: Diagnosis present

## 2014-06-14 DIAGNOSIS — Z7901 Long term (current) use of anticoagulants: Secondary | ICD-10-CM

## 2014-06-14 DIAGNOSIS — N281 Cyst of kidney, acquired: Secondary | ICD-10-CM | POA: Diagnosis present

## 2014-06-14 DIAGNOSIS — Z79899 Other long term (current) drug therapy: Secondary | ICD-10-CM

## 2014-06-14 DIAGNOSIS — H3530 Unspecified macular degeneration: Secondary | ICD-10-CM | POA: Diagnosis present

## 2014-06-14 DIAGNOSIS — F0390 Unspecified dementia without behavioral disturbance: Secondary | ICD-10-CM

## 2014-06-14 DIAGNOSIS — M6282 Rhabdomyolysis: Secondary | ICD-10-CM

## 2014-06-14 DIAGNOSIS — N210 Calculus in bladder: Secondary | ICD-10-CM

## 2014-06-14 DIAGNOSIS — B952 Enterococcus as the cause of diseases classified elsewhere: Secondary | ICD-10-CM | POA: Diagnosis present

## 2014-06-14 DIAGNOSIS — R32 Unspecified urinary incontinence: Secondary | ICD-10-CM | POA: Diagnosis present

## 2014-06-14 DIAGNOSIS — G250 Essential tremor: Secondary | ICD-10-CM | POA: Diagnosis present

## 2014-06-14 DIAGNOSIS — L8990 Pressure ulcer of unspecified site, unspecified stage: Secondary | ICD-10-CM

## 2014-06-14 DIAGNOSIS — R109 Unspecified abdominal pain: Secondary | ICD-10-CM

## 2014-06-14 DIAGNOSIS — E860 Dehydration: Secondary | ICD-10-CM | POA: Diagnosis present

## 2014-06-14 DIAGNOSIS — E538 Deficiency of other specified B group vitamins: Secondary | ICD-10-CM | POA: Diagnosis present

## 2014-06-14 HISTORY — DX: Calculus in bladder: N21.0

## 2014-06-14 HISTORY — DX: Unspecified hydronephrosis: N13.30

## 2014-06-14 HISTORY — DX: Multiple fractures of ribs, unspecified side, initial encounter for closed fracture: S22.49XA

## 2014-06-14 MED ORDER — SODIUM CHLORIDE 0.9 % IV SOLN
Freq: Once | INTRAVENOUS | Status: AC
  Administered 2014-06-15: 01:00:00 via INTRAVENOUS

## 2014-06-14 NOTE — ED Notes (Signed)
Per EMS pt. From home. Pt. Increased weakness and diarrhea today. Pt. With multiple decubitus ulcers per EMS. Pt. Also c/o intermittent abdominal pain. Pt. Also reports new onset urinary incontinence.

## 2014-06-15 ENCOUNTER — Encounter: Payer: Self-pay | Admitting: Cardiology

## 2014-06-15 ENCOUNTER — Emergency Department (HOSPITAL_COMMUNITY): Payer: Medicare Other

## 2014-06-15 ENCOUNTER — Encounter (HOSPITAL_COMMUNITY): Payer: Self-pay | Admitting: Family Medicine

## 2014-06-15 DIAGNOSIS — I959 Hypotension, unspecified: Secondary | ICD-10-CM | POA: Insufficient documentation

## 2014-06-15 DIAGNOSIS — R32 Unspecified urinary incontinence: Secondary | ICD-10-CM | POA: Diagnosis present

## 2014-06-15 DIAGNOSIS — Z9181 History of falling: Secondary | ICD-10-CM | POA: Diagnosis not present

## 2014-06-15 DIAGNOSIS — L89892 Pressure ulcer of other site, stage 2: Secondary | ICD-10-CM | POA: Diagnosis present

## 2014-06-15 DIAGNOSIS — N390 Urinary tract infection, site not specified: Secondary | ICD-10-CM | POA: Diagnosis present

## 2014-06-15 DIAGNOSIS — E878 Other disorders of electrolyte and fluid balance, not elsewhere classified: Secondary | ICD-10-CM | POA: Diagnosis present

## 2014-06-15 DIAGNOSIS — G4733 Obstructive sleep apnea (adult) (pediatric): Secondary | ICD-10-CM | POA: Diagnosis present

## 2014-06-15 DIAGNOSIS — F0390 Unspecified dementia without behavioral disturbance: Secondary | ICD-10-CM

## 2014-06-15 DIAGNOSIS — E871 Hypo-osmolality and hyponatremia: Secondary | ICD-10-CM

## 2014-06-15 DIAGNOSIS — R296 Repeated falls: Secondary | ICD-10-CM | POA: Diagnosis present

## 2014-06-15 DIAGNOSIS — I10 Essential (primary) hypertension: Secondary | ICD-10-CM | POA: Diagnosis present

## 2014-06-15 DIAGNOSIS — S2249XA Multiple fractures of ribs, unspecified side, initial encounter for closed fracture: Secondary | ICD-10-CM

## 2014-06-15 DIAGNOSIS — E538 Deficiency of other specified B group vitamins: Secondary | ICD-10-CM | POA: Diagnosis present

## 2014-06-15 DIAGNOSIS — G250 Essential tremor: Secondary | ICD-10-CM | POA: Diagnosis present

## 2014-06-15 DIAGNOSIS — N1330 Unspecified hydronephrosis: Secondary | ICD-10-CM

## 2014-06-15 DIAGNOSIS — N179 Acute kidney failure, unspecified: Secondary | ICD-10-CM

## 2014-06-15 DIAGNOSIS — Z7901 Long term (current) use of anticoagulants: Secondary | ICD-10-CM | POA: Diagnosis not present

## 2014-06-15 DIAGNOSIS — W19XXXA Unspecified fall, initial encounter: Secondary | ICD-10-CM | POA: Diagnosis present

## 2014-06-15 DIAGNOSIS — Z950 Presence of cardiac pacemaker: Secondary | ICD-10-CM | POA: Diagnosis not present

## 2014-06-15 DIAGNOSIS — Z8249 Family history of ischemic heart disease and other diseases of the circulatory system: Secondary | ICD-10-CM | POA: Diagnosis not present

## 2014-06-15 DIAGNOSIS — Z820 Family history of epilepsy and other diseases of the nervous system: Secondary | ICD-10-CM | POA: Diagnosis not present

## 2014-06-15 DIAGNOSIS — Z800 Family history of malignant neoplasm of digestive organs: Secondary | ICD-10-CM | POA: Diagnosis not present

## 2014-06-15 DIAGNOSIS — S2241XA Multiple fractures of ribs, right side, initial encounter for closed fracture: Secondary | ICD-10-CM | POA: Diagnosis present

## 2014-06-15 DIAGNOSIS — B952 Enterococcus as the cause of diseases classified elsewhere: Secondary | ICD-10-CM | POA: Diagnosis present

## 2014-06-15 DIAGNOSIS — L89159 Pressure ulcer of sacral region, unspecified stage: Secondary | ICD-10-CM | POA: Diagnosis present

## 2014-06-15 DIAGNOSIS — I4891 Unspecified atrial fibrillation: Secondary | ICD-10-CM | POA: Diagnosis present

## 2014-06-15 DIAGNOSIS — R531 Weakness: Secondary | ICD-10-CM | POA: Diagnosis present

## 2014-06-15 DIAGNOSIS — A419 Sepsis, unspecified organism: Secondary | ICD-10-CM | POA: Diagnosis present

## 2014-06-15 DIAGNOSIS — Z87891 Personal history of nicotine dependence: Secondary | ICD-10-CM | POA: Diagnosis not present

## 2014-06-15 DIAGNOSIS — Y92019 Unspecified place in single-family (private) house as the place of occurrence of the external cause: Secondary | ICD-10-CM | POA: Diagnosis not present

## 2014-06-15 DIAGNOSIS — N281 Cyst of kidney, acquired: Secondary | ICD-10-CM | POA: Diagnosis present

## 2014-06-15 DIAGNOSIS — M6282 Rhabdomyolysis: Secondary | ICD-10-CM | POA: Diagnosis present

## 2014-06-15 DIAGNOSIS — G629 Polyneuropathy, unspecified: Secondary | ICD-10-CM | POA: Diagnosis present

## 2014-06-15 DIAGNOSIS — Z79891 Long term (current) use of opiate analgesic: Secondary | ICD-10-CM | POA: Diagnosis not present

## 2014-06-15 DIAGNOSIS — H3530 Unspecified macular degeneration: Secondary | ICD-10-CM | POA: Diagnosis present

## 2014-06-15 DIAGNOSIS — N210 Calculus in bladder: Secondary | ICD-10-CM

## 2014-06-15 DIAGNOSIS — Z79899 Other long term (current) drug therapy: Secondary | ICD-10-CM | POA: Diagnosis not present

## 2014-06-15 DIAGNOSIS — D649 Anemia, unspecified: Secondary | ICD-10-CM | POA: Diagnosis present

## 2014-06-15 DIAGNOSIS — E860 Dehydration: Secondary | ICD-10-CM | POA: Diagnosis present

## 2014-06-15 DIAGNOSIS — R0902 Hypoxemia: Secondary | ICD-10-CM | POA: Diagnosis present

## 2014-06-15 LAB — COMPREHENSIVE METABOLIC PANEL
ALT: 40 U/L (ref 0–53)
AST: 58 U/L — ABNORMAL HIGH (ref 0–37)
Albumin: 3.1 g/dL — ABNORMAL LOW (ref 3.5–5.2)
Alkaline Phosphatase: 84 U/L (ref 39–117)
Anion gap: 9 (ref 5–15)
BUN: 53 mg/dL — ABNORMAL HIGH (ref 6–23)
CALCIUM: 8.3 mg/dL — AB (ref 8.4–10.5)
CO2: 22 mmol/L (ref 19–32)
Chloride: 94 mEq/L — ABNORMAL LOW (ref 96–112)
Creatinine, Ser: 2.03 mg/dL — ABNORMAL HIGH (ref 0.50–1.35)
GFR calc Af Amer: 34 mL/min — ABNORMAL LOW (ref >90)
GFR, EST NON AFRICAN AMERICAN: 29 mL/min — AB (ref >90)
GLUCOSE: 109 mg/dL — AB (ref 70–99)
POTASSIUM: 4.4 mmol/L (ref 3.5–5.1)
SODIUM: 125 mmol/L — AB (ref 135–145)
Total Bilirubin: 1.3 mg/dL — ABNORMAL HIGH (ref 0.3–1.2)
Total Protein: 6.2 g/dL (ref 6.0–8.3)

## 2014-06-15 LAB — LACTIC ACID, PLASMA: Lactic Acid, Venous: 1.8 mmol/L (ref 0.5–2.2)

## 2014-06-15 LAB — POC OCCULT BLOOD, ED: FECAL OCCULT BLD: POSITIVE — AB

## 2014-06-15 LAB — URINALYSIS, ROUTINE W REFLEX MICROSCOPIC
BILIRUBIN URINE: NEGATIVE
GLUCOSE, UA: NEGATIVE mg/dL
Ketones, ur: NEGATIVE mg/dL
NITRITE: POSITIVE — AB
PH: 7 (ref 5.0–8.0)
Protein, ur: 30 mg/dL — AB
Specific Gravity, Urine: 1.01 (ref 1.005–1.030)
Urobilinogen, UA: 0.2 mg/dL (ref 0.0–1.0)

## 2014-06-15 LAB — CLOSTRIDIUM DIFFICILE BY PCR: CDIFFPCR: NEGATIVE

## 2014-06-15 LAB — CBC WITH DIFFERENTIAL/PLATELET
BASOS ABS: 0 10*3/uL (ref 0.0–0.1)
Basophils Relative: 0 % (ref 0–1)
EOS PCT: 1 % (ref 0–5)
Eosinophils Absolute: 0.1 10*3/uL (ref 0.0–0.7)
HCT: 41.6 % (ref 39.0–52.0)
Hemoglobin: 14.3 g/dL (ref 13.0–17.0)
LYMPHS ABS: 1.2 10*3/uL (ref 0.7–4.0)
LYMPHS PCT: 6 % — AB (ref 12–46)
MCH: 29.2 pg (ref 26.0–34.0)
MCHC: 34.4 g/dL (ref 30.0–36.0)
MCV: 84.9 fL (ref 78.0–100.0)
Monocytes Absolute: 1.4 10*3/uL — ABNORMAL HIGH (ref 0.1–1.0)
Monocytes Relative: 7 % (ref 3–12)
Neutro Abs: 16.3 10*3/uL — ABNORMAL HIGH (ref 1.7–7.7)
Neutrophils Relative %: 86 % — ABNORMAL HIGH (ref 43–77)
PLATELETS: 356 10*3/uL (ref 150–400)
RBC: 4.9 MIL/uL (ref 4.22–5.81)
RDW: 15 % (ref 11.5–15.5)
WBC: 19.1 10*3/uL — AB (ref 4.0–10.5)

## 2014-06-15 LAB — PROTIME-INR
INR: 2.4 — AB (ref 0.00–1.49)
Prothrombin Time: 26.4 seconds — ABNORMAL HIGH (ref 11.6–15.2)

## 2014-06-15 LAB — URINE MICROSCOPIC-ADD ON

## 2014-06-15 LAB — I-STAT CG4 LACTIC ACID, ED: Lactic Acid, Venous: 1.72 mmol/L (ref 0.5–2.2)

## 2014-06-15 LAB — MRSA PCR SCREENING: MRSA BY PCR: NEGATIVE

## 2014-06-15 LAB — LIPASE, BLOOD: Lipase: 49 U/L (ref 11–59)

## 2014-06-15 LAB — CK: Total CK: 938 U/L — ABNORMAL HIGH (ref 7–232)

## 2014-06-15 LAB — TROPONIN I: Troponin I: <0.03 ng/mL (ref <0.031)

## 2014-06-15 MED ORDER — DEXTROSE 5 % IV SOLN
1.0000 g | INTRAVENOUS | Status: DC
  Filled 2014-06-15: qty 10

## 2014-06-15 MED ORDER — ACETAMINOPHEN 650 MG RE SUPP: 650.0000 mg | Freq: Four times a day (QID) | RECTAL | Status: DC | PRN

## 2014-06-15 MED ORDER — CEFTRIAXONE SODIUM IN DEXTROSE 20 MG/ML IV SOLN
1.0000 g | INTRAVENOUS | Status: DC
  Administered 2014-06-15 – 2014-06-18 (×4): 1 g via INTRAVENOUS
  Filled 2014-06-15 (×7): qty 50

## 2014-06-15 MED ORDER — VANCOMYCIN HCL IN DEXTROSE 1-5 GM/200ML-% IV SOLN
1000.0000 mg | Freq: Once | INTRAVENOUS | Status: AC
  Administered 2014-06-15: 1000 mg via INTRAVENOUS
  Filled 2014-06-15: qty 200

## 2014-06-15 MED ORDER — SOTALOL HCL 80 MG PO TABS
160.0000 mg | ORAL_TABLET | Freq: Two times a day (BID) | ORAL | Status: DC
  Administered 2014-06-16 – 2014-06-18 (×4): 160 mg via ORAL
  Filled 2014-06-15 (×11): qty 2

## 2014-06-15 MED ORDER — HEPARIN SODIUM (PORCINE) 5000 UNIT/ML IJ SOLN
5000.0000 [IU] | Freq: Three times a day (TID) | INTRAMUSCULAR | Status: DC
  Administered 2014-06-15 – 2014-06-18 (×10): 5000 [IU] via SUBCUTANEOUS
  Filled 2014-06-15 (×10): qty 1

## 2014-06-15 MED ORDER — SODIUM CHLORIDE 0.9 % IV BOLUS (SEPSIS)
500.0000 mL | Freq: Once | INTRAVENOUS | Status: AC
  Administered 2014-06-15: 500 mL via INTRAVENOUS

## 2014-06-15 MED ORDER — ACETAMINOPHEN 325 MG PO TABS
650.0000 mg | ORAL_TABLET | Freq: Four times a day (QID) | ORAL | Status: DC | PRN
  Administered 2014-06-15 – 2014-06-16 (×3): 650 mg via ORAL
  Filled 2014-06-15 (×3): qty 2

## 2014-06-15 MED ORDER — SODIUM CHLORIDE 0.9 % IV SOLN
INTRAVENOUS | Status: DC
  Administered 2014-06-15: 06:00:00 via INTRAVENOUS

## 2014-06-15 MED ORDER — ONDANSETRON HCL 4 MG/2ML IJ SOLN: 4.0000 mg | Freq: Four times a day (QID) | INTRAMUSCULAR | Status: DC | PRN

## 2014-06-15 MED ORDER — SODIUM CHLORIDE 0.9 % IV SOLN
INTRAVENOUS | Status: DC
  Administered 2014-06-15 – 2014-06-16 (×4): via INTRAVENOUS

## 2014-06-15 MED ORDER — OXYCODONE HCL 5 MG PO TABS: 5.0000 mg | ORAL_TABLET | Freq: Four times a day (QID) | ORAL | Status: DC | PRN

## 2014-06-15 MED ORDER — OXYCODONE-ACETAMINOPHEN 10-325 MG PO TABS: 1.0000 | ORAL_TABLET | Freq: Four times a day (QID) | ORAL | Status: DC | PRN

## 2014-06-15 MED ORDER — SODIUM CHLORIDE 0.9 % IJ SOLN
3.0000 mL | Freq: Two times a day (BID) | INTRAMUSCULAR | Status: DC
  Administered 2014-06-16 – 2014-06-17 (×2): 3 mL via INTRAVENOUS

## 2014-06-15 MED ORDER — ONDANSETRON HCL 4 MG PO TABS: 4.0000 mg | ORAL_TABLET | Freq: Four times a day (QID) | ORAL | Status: DC | PRN

## 2014-06-15 MED ORDER — CEFEPIME HCL 1 G IJ SOLR
1.0000 g | Freq: Once | INTRAMUSCULAR | Status: AC
  Administered 2014-06-15: 1 g via INTRAVENOUS
  Filled 2014-06-15: qty 1

## 2014-06-15 MED ORDER — CETYLPYRIDINIUM CHLORIDE 0.05 % MT LIQD
7.0000 mL | Freq: Two times a day (BID) | OROMUCOSAL | Status: DC
  Administered 2014-06-15 – 2014-06-17 (×5): 7 mL via OROMUCOSAL

## 2014-06-15 MED ORDER — OXYCODONE-ACETAMINOPHEN 5-325 MG PO TABS
1.0000 | ORAL_TABLET | Freq: Four times a day (QID) | ORAL | Status: DC | PRN
Start: 2014-06-15 — End: 2014-06-18

## 2014-06-15 NOTE — ED Notes (Signed)
Thurnell LoseAnna Stone, RN informed of new order for PT/INR,d/t pt having positive occult stool.  Also informed to Admitting MD aware of wound to sacral area.

## 2014-06-15 NOTE — ED Provider Notes (Addendum)
CSN: [id number]     Arrival date & time 06/14/14  2349 History  This chart was scribed for Gilda Crease, MD by [place] Peace, ED Scribe. The patient was seen in [facility]. The patient's care was started at 11:59 PM.    Chief Complaint  Patient presents with  . Weakness  . Diarrhea      Patient is a 80 y.o. male presenting with weakness and diarrhea. The history is provided by the patient. No language interpreter was used.  Weakness This is a recurrent problem. The problem has not changed since onset.Pertinent negatives include no chest pain, no abdominal pain, no headaches and no shortness of breath. Nothing aggravates the symptoms. He has tried nothing for the symptoms.  Diarrhea Associated symptoms: no abdominal pain and no headaches    HPI Comments: Jon [name] is a 80 y.o. male who presents to the Emergency Department complaining of increased weakness and diarrhea onset today. Pt also complains of intermittent abdominal pain and new onset of urinary incontinence. Pt reports that he was seen here at ED 5 days ago for similar problems but sent home and was to start receiving home health care. He notes that he has been sleeping in recliner for past couple days watching over his wife. Multiple decubitus ulcers noted per EMS. Pt states that he regularly follows up with skin doctor about problem but did not realize they had become so bad. History of heart disease and pacemaker insertion.   Past Medical History  Diagnosis Date  . Heart disease   . Atrial fibrillation     WITH PACEMAKER  . OSA (obstructive sleep apnea)   . Memory loss   . Bleeding ulcer   . B12 deficiency   . Hypertension   . Abnormality of gait 05/03/2013  . Essential and other specified forms of tremor 05/03/2013  . Polyneuropathy in other diseases classified elsewhere 05/03/2013  . Peripheral edema   . Varicose veins     Bilateral  . Rib fracture   . Macular degeneration of left eye    Past  Surgical History  Procedure Laterality Date  . Pacemaker insertion  2006  . Meningioma removal  1992    POST RT FRONTAL   . Cataract extraction Bilateral 2013  . Permanent pacemaker generator change N/A 08/13/2012    Procedure: PERMANENT PACEMAKER GENERATOR CHANGE;  Surgeon: Thurmon Fair, MD;  Location: [facility] CATH [facility];  Service: Cardiovascular;  Laterality: N/A;   Family History  Problem Relation Age of Onset  . Aortic aneurysm Mother   . Parkinsonism Mother   . Heart disease Father   . Cancer Sister     esaphageal cancer   History  Substance Use Topics  . Smoking status: Former Smoker    Types: Cigarettes  . Smokeless tobacco: Never Used     Comment: QUIT 35 YRS AGO  . Alcohol Use: No    Review of Systems  Respiratory: Negative for shortness of breath.   Cardiovascular: Negative for chest pain.  Gastrointestinal: Positive for diarrhea. Negative for abdominal pain.  Skin: Positive for wound (ulcers).  Neurological: Positive for weakness. Negative for headaches.  All other systems reviewed and are negative.     Allergies  Iohexol; Dilantin; and Tegretol  Home Medications   Prior to Admission medications   Medication Sig Start Date End Date Taking? Authorizing Provider  ALPRAZolam Prudy Feeler) 1 MG tablet Take 1 mg by mouth 4 (four) times daily as needed for anxiety or sleep.  Historical Provider, MD  diltiazem (CARDIZEM CD) 120 MG 24 hr capsule TAKE ONE CAPSULE BY MOUTH ONCE DAILY. 09/16/13   Mihai Croitoru, MD  fexofenadine (ALLEGRA) 180 MG tablet Take 180 mg by mouth daily.    Historical Provider, MD  furosemide (LASIX) 20 MG tablet Take 1 tablet (20 mg total) by mouth daily. 06/11/14   Erick Blinks, MD  furosemide (LASIX) 40 MG tablet Take 1 tablet (40 mg total) by mouth daily as needed (for swelling in legs). 05/09/14   Mihai Croitoru, MD  HYDROcodone-acetaminophen (NORCO) 10-325 MG per tablet Take 1 tablet by mouth 4 (four) times daily as needed for severe pain.      Historical Provider, MD  omeprazole (PRILOSEC) 20 MG capsule Take 20 mg by mouth daily.    Historical Provider, MD  oxyCODONE-acetaminophen (PERCOCET) 10-325 MG per tablet Take 1 tablet by mouth 4 (four) times daily.    Historical Provider, MD  Polyethyl Glycol-Propyl Glycol (SYSTANE OP) Place 1 drop into both eyes daily as needed (for dry eyes).    Historical Provider, MD  potassium chloride (K-DUR,KLOR-CON) 10 MEQ tablet Take 1 tablet (10 mEq total) by mouth daily as needed (takes with lasix). 05/09/14   Mihai Croitoru, MD  predniSONE (DELTASONE) 5 MG tablet Take 5 mg by mouth daily as needed (for eczema on hand).    Historical Provider, MD  sotalol (BETAPACE) 160 MG tablet TAKE 1 TABLET BY MOUTH TWICE DAILY.    Mihai Croitoru, MD  vitamin B-12 (CYANOCOBALAMIN) 1000 MCG tablet Take 1,000 mcg by mouth daily.    Historical Provider, MD  warfarin (COUMADIN) 2.5 MG tablet Take 1-2 tablets by mouth daily as directed Patient taking differently: Take 2.5-5 mg by mouth See admin instructions. Take 1 tablet every day, except take 2 tablets on Monday. 03/11/14   Phillips Hay, RPH-CPP   BP 94/73 mmHg  Pulse 59  Temp(Src) 97.8 F (36.6 C) (Oral)  Resp 15  SpO2 95% Physical Exam  Constitutional: He is oriented to person, place, and time. He appears well-developed and well-nourished. No distress.  HENT:  Head: Normocephalic and atraumatic.  Right Ear: Hearing normal.  Left Ear: Hearing normal.  Nose: Nose normal.  Mouth/Throat: Oropharynx is clear and moist and mucous membranes are normal.  Eyes: Conjunctivae and EOM are normal. Pupils are equal, round, and reactive to light.  Neck: Normal range of motion. Neck supple.  Cardiovascular: Regular rhythm, S1 normal and S2 normal.  Exam reveals no gallop and no friction rub.   No murmur heard. Pulmonary/Chest: Effort normal and breath sounds normal. No respiratory distress. He exhibits no tenderness.  Abdominal: Soft. Normal appearance and bowel  sounds are normal. There is no hepatosplenomegaly. There is tenderness. There is no rebound, no guarding, no tenderness at McBurney's point and negative Murphy's sign. No hernia.  Epigastric tenderness.   Musculoskeletal: Normal range of motion.  Neurological: He is alert and oriented to person, place, and time. He has normal strength. No cranial nerve deficit or sensory deficit. Coordination normal. GCS eye subscore is 4. GCS verbal subscore is 5. GCS motor subscore is 6.  Skin: Skin is warm, dry and intact. No rash noted. No cyanosis.  Psychiatric: He has a normal mood and affect. His speech is normal and behavior is normal. Thought content normal.  Nursing note and vitals reviewed.   ED Course  Procedures (including critical care time) Labs Review Labs Reviewed  CBC WITH DIFFERENTIAL - Abnormal; Notable for the following:    WBC  19.1 (*)    Neutrophils Relative % 86 (*)    Neutro Abs 16.3 (*)    Lymphocytes Relative 6 (*)    Monocytes Absolute 1.4 (*)    All other components within normal limits  COMPREHENSIVE METABOLIC PANEL - Abnormal; Notable for the following:    Sodium 125 (*)    Chloride 94 (*)    Glucose, Bld 109 (*)    BUN 53 (*)    Creatinine, Ser 2.03 (*)    Calcium 8.3 (*)    Albumin 3.1 (*)    AST 58 (*)    Total Bilirubin 1.3 (*)    GFR calc non Af Amer 29 (*)    GFR calc Af Amer 34 (*)    All other components within normal limits  URINALYSIS, ROUTINE W REFLEX MICROSCOPIC - Abnormal; Notable for the following:    APPearance HAZY (*)    Hgb urine dipstick MODERATE (*)    Protein, ur 30 (*)    Nitrite POSITIVE (*)    Leukocytes, UA LARGE (*)    All other components within normal limits  URINE MICROSCOPIC-ADD ON - Abnormal; Notable for the following:    Bacteria, UA MANY (*)    All other components within normal limits  CLOSTRIDIUM DIFFICILE BY PCR  URINE CULTURE  LIPASE, BLOOD  TROPONIN I  CK  I-STAT CG4 LACTIC ACID, ED  POC OCCULT BLOOD, ED     Imaging Review Ct Abdomen Pelvis Wo Contrast  06/15/2014   CLINICAL DATA:  Acute abdominal pain with diarrhea  EXAM: CT ABDOMEN AND PELVIS WITHOUT CONTRAST  TECHNIQUE: Multidetector CT imaging of the abdomen and pelvis was performed following the standard protocol without IV contrast.  COMPARISON:  None.  FINDINGS: BODY WALL: Unremarkable.  LOWER CHEST: Right ventricular pacer lead.  Trace right pleural effusion with mild atelectasis. This is presumably reactive to a mildly displaced right 12 and 10 rib fractures.  ABDOMEN/PELVIS:  Liver: No focal abnormality.  Biliary: Cholelithiasis.  No evidence of acute cholecystitis.  Pancreas: Unremarkable.  Spleen: Unremarkable.  Adrenals: Unremarkable.  Kidneys and ureters: Mild bilateral hydroureteronephrosis secondary to urinary bladder distention.  There is a large exophytic cyst from the right lower pole measuring 15 cm in maximal dimension. Mildly high-density 1 cm right upper pole and 2.5 cm left lower pole renal lesions likely reflect proteinaceous or hemorrhagic cysts.  Bladder: Small calculi layer within the distended bladder. Suspect chronic outlet obstruction.  Reproductive: Unremarkable.  Bowel: No obstruction. Normal appendix.  Retroperitoneum: There is a 2.5 cm nodule anterior to the duodenal bulb which is stable since 2007. The nodule is discrete from the liver, but contiguous with the duodenum. Although this portion the duodenum is flattened, no evidence of gastric outlet obstruction.  Peritoneum: No ascites or pneumoperitoneum.  Vascular: No acute abnormality.  OSSEOUS:  Moderately displaced right tenth and nondisplaced right twelfth rib fractures.  Advanced lumbar degenerative disc and facet disease with multilevel canal and foraminal stenosis.  IMPRESSION: 1. Bilateral hydronephrosis from urinary retention. Retention is likely chronic as there are multiple bladder calculi. 2. Displaced right tenth and twelfth rib fractures with small pleural  effusion and atelectasis. 3. Cholelithiasis. 4. Bilateral renal cysts. There are high density renal lesions bilaterally which are likely hemorrhagic/proteinaceous cysts. If renal ultrasound performed to follow-up the hydronephrosis, recommend attention to these lesions. 5. 2.5 cm mass in the right upper quadrant, contiguous with the duodenum, is stable since 2007 and benign.   Electronically Signed   By:  Tiburcio PeaJonathan  Watts M.D.   On: 06/15/2014 05:03   Dg Chest Port 1 View  06/15/2014   CLINICAL DATA:  Hypoxia and weakness  EXAM: PORTABLE CHEST - 1 VIEW  COMPARISON:  06/09/2014  FINDINGS: There is a dual-chamber pacer from the left with leads in unchanged position.  Unchanged mild cardiomegaly and aortic tortuosity. The lungs are chronically hyperinflated. There is no edema, consolidation, effusion, or pneumothorax.  IMPRESSION: Stable exam.  No evidence of acute cardiopulmonary disease.   Electronically Signed   By: Tiburcio PeaJonathan  Watts M.D.   On: 06/15/2014 03:01     EKG Interpretation   Date/Time:  Wednesday June 15 2014 00:31:51 EST Ventricular Rate:  61 PR Interval:  188 QRS Duration: 105 QT Interval:  484 QTC Calculation: 488 R Axis:   73 Text Interpretation:  Atrial-paced rhythm Borderline repolarization  abnormality Borderline prolonged QT interval Confirmed by Blinda LeatherwoodPOLLINA  MD,  Susana Gripp [phone]) on 06/15/2014 12:51:49 AM     Medications  cefTRIAXone (ROCEPHIN) 1 g in dextrose 5 % 50 mL IVPB (not administered)  0.9 %  sodium chloride infusion ( Intravenous Stopped 06/15/14 0251)  sodium chloride 0.9 % bolus 500 mL (0 mLs Intravenous Stopped 06/15/14 0328)   12:02 AM- Treatment plan was discussed with patient who verbalizes understanding and agrees.   MDM   Final diagnoses:  Hypoxia  Abdominal pain, acute  Decubitus skin ulcer  UTI (lower urinary tract infection)  Urinary retention  AKI (acute kidney injury)    Patient presents to the ER for evaluation of weakness and  diarrhea. Patient was reportedly admitted to the hospital recently for similar symptoms. He was discharged on December 26. Patient reports that he has been sitting in a recliner since he left the hospital. EMS report that he was sitting in his own urine and feces when the encountered him. He does have significant excoriations and skin breakdown in the sacral, gluteal and scrotal regions secondary to this.  Patient was exhibiting mild hypotension upon arrival. He does admit that he has not been eating or drinking. He also had some mild hypoxemia with lowest oxygen saturation of 90%. Chest x-ray, however, did not show any evidence of pneumonia.  Patient does have a significant leukocytosis. Urinalysis suggests infection, no other obvious infections noted. Patient does have significant elevation of BUN and creatinine. Initially this was felt to be secondary to dehydration. A CT scan was performed, however, because patient was complaining of abdominal pain. He was found to have a markedly distended urinary bladder. It's not clear if this is chronic or not, but outlet obstruction is considered. Patient had a Foley catheter placed.  Case discussed with Dr. Alvester MorinNewton, hospitalist. He felt that the patient would require stepdown unit services. He also recommended broad-spectrum antibiotic coverage including cefepime and vancomycin.  I personally performed the services described in this documentation, which was scribed in my presence. The recorded information has been reviewed and is accurate.  CRITICAL CARE Performed by: Gilda CreasePOLLINA, Blessed Cotham J.   Total critical care time: 30min  Critical care time was exclusive of separately billable procedures and treating other patients.  Critical care was necessary to treat or prevent imminent or life-threatening deterioration.  Critical care was time spent personally by me on the following activities: development of treatment plan with patient and/or surrogate as well  as nursing, discussions with consultants, evaluation of patient's response to treatment, examination of patient, obtaining history from patient or surrogate, ordering and performing treatments and interventions, ordering and review of  laboratory studies, ordering and review of radiographic studies, pulse oximetry and re-evaluation of patient's condition.    Gilda Crease, MD 06/15/14 0530  Gilda Crease, MD 06/15/14 [phone]

## 2014-06-15 NOTE — H&P (Signed)
History and Physical  Jon [name] ZOX:[id number] DOB: 03-Jun-1932 DOA: 06/14/2014  Referring physician: Dr. Blinda Leatherwood n PCP: Milana Obey, MD   Chief Complaint: weak  HPI:  80 year old man presented with generalized weakness, very limited movement in the last 3 days with very limited oral intake. Found in recliner chair in own feces and urine. Found to have multiple decubitus ulcers with skin breakdown. CT revealed obstructive uropathy and Foley catheter was placed. Admitted for acute kidney injury, dehydration, UTI.  Admitted/discharge 12/26 for generalized weakness, dehydration, mild rhabdomyolysis, fall at home. Was hydrated with IV fluids for rhabdomyolysis. He was started on Lasix and discharged home with home health. Since being at home he has been primarily confined to his recliner chair and has not been able to get up evening of the bathroom. He complains of generalized weakness without focal deficit. He has no abdominal pain, no fever or systemic symptoms and no other complaints. He has had no difficulty urinating or pain.  In the emergency department afebrile heart rate 50-70s. Systolic blood pressure 80-100s. Normal SPO2. Sodium 125, BUN 53, creatinine 2.03 with baseline 0.86, LFTs unremarkable. Total CK 938. Troponin negative, lactic acid normal. WBC 19.1.urinalysis grossly positive. Urine culture pending. C. Difficile negative. Chest x-ray no acute disease. CT abdomen and pelvis bilateral hydronephrosis secondary to urinary retention, felt to be chronic, multiple bladder calculi. Displaced right 10th and 12th rib fractures with small pleural effusion and atelectasis. Bilateral renal cysts.  Review of Systems:  Negative for fever, visual changes, sore throat, rash, new muscle aches, chest pain, SOB, dysuria, bleeding, n/v/abdominal pain.  Past Medical History  Diagnosis Date  . Heart disease   . Atrial fibrillation     WITH PACEMAKER  . OSA (obstructive sleep apnea)   .  Memory loss   . Bleeding ulcer   . B12 deficiency   . Hypertension   . Abnormality of gait 05/03/2013  . Essential and other specified forms of tremor 05/03/2013  . Polyneuropathy in other diseases classified elsewhere 05/03/2013  . Peripheral edema   . Varicose veins     Bilateral  . Rib fracture   . Macular degeneration of left eye     Past Surgical History  Procedure Laterality Date  . Pacemaker insertion  2006  . Meningioma removal  1992    POST RT FRONTAL   . Cataract extraction Bilateral 2013  . Permanent pacemaker generator change N/A 08/13/2012    Procedure: PERMANENT PACEMAKER GENERATOR CHANGE;  Surgeon: Thurmon Fair, MD;  Location: [facility] CATH [facility];  Service: Cardiovascular;  Laterality: N/A;    Social History:  reports that he has quit smoking. His smoking use included Cigarettes. He smoked 0.00 packs per day. He has never used smokeless tobacco. He reports that he does not drink alcohol or use illicit drugs.  Allergies  Allergen Reactions  . Iohexol      Desc: PT STATES THROAT/FACE SWELLING W/ IVP DYE IN 1990. PT HAS NOT HAD ANY EXAMS DONE W/DYE SINCE AND HAS NEVER BEEN PRE MEDICATED. PER PT, Onset Date: [id number]   . Dilantin [Phenytoin Sodium Extended] Rash  . Tegretol [Carbamazepine] Rash    Family History  Problem Relation Age of Onset  . Aortic aneurysm Mother   . Parkinsonism Mother   . Heart disease Father   . Cancer Sister     esaphageal cancer     Prior to Admission medications   Medication Sig Start Date End Date Taking? Authorizing Provider  ALPRAZolam Prudy Feeler) 1 MG  tablet Take 1 mg by mouth 4 (four) times daily as needed for anxiety or sleep.    Yes Historical Provider, MD  diltiazem (CARDIZEM CD) 120 MG 24 hr capsule TAKE ONE CAPSULE BY MOUTH ONCE DAILY. 09/16/13  Yes Mihai Croitoru, MD  fexofenadine (ALLEGRA) 180 MG tablet Take 180 mg by mouth daily.   Yes Historical Provider, MD  HYDROcodone-acetaminophen (NORCO) 10-325 MG per tablet Take 1 tablet  by mouth 4 (four) times daily as needed for severe pain.    Yes Historical Provider, MD  Multiple Vitamins-Minerals (ICAPS AREDS FORMULA PO) Take 1 capsule by mouth daily.   Yes Historical Provider, MD  omeprazole (PRILOSEC) 20 MG capsule Take 20 mg by mouth daily.   Yes Historical Provider, MD  Polyethyl Glycol-Propyl Glycol (SYSTANE OP) Place 1 drop into both eyes daily as needed (for dry eyes).   Yes Historical Provider, MD  sotalol (BETAPACE) 160 MG tablet TAKE 1 TABLET BY MOUTH TWICE DAILY.   Yes Mihai Croitoru, MD  vitamin B-12 (CYANOCOBALAMIN) 1000 MCG tablet Take 1,000 mcg by mouth daily.   Yes Historical Provider, MD  warfarin (COUMADIN) 2.5 MG tablet Take 1-2 tablets by mouth daily as directed Patient taking differently: Take 2.5-5 mg by mouth See admin instructions. Take 1 tablet every day, except take 2 tablets on Monday. 03/11/14  Yes Phillips HayKristin Alvstad, RPH-CPP  furosemide (LASIX) 20 MG tablet Take 1 tablet (20 mg total) by mouth daily. 06/11/14   Erick BlinksJehanzeb Memon, MD  furosemide (LASIX) 40 MG tablet Take 1 tablet (40 mg total) by mouth daily as needed (for swelling in legs). Patient not taking: Reported on 06/15/2014 05/09/14   Thurmon FairMihai Croitoru, MD  oxyCODONE-acetaminophen (PERCOCET) 10-325 MG per tablet Take 1 tablet by mouth 4 (four) times daily.    Historical Provider, MD  potassium chloride (K-DUR,KLOR-CON) 10 MEQ tablet Take 1 tablet (10 mEq total) by mouth daily as needed (takes with lasix). 05/09/14   Thurmon FairMihai Croitoru, MD   Physical Exam: Filed Vitals:   06/15/14 0600 06/15/14 0630 06/15/14 0700 06/15/14 0730  BP: 93/64 83/53 87/50  99/60  Pulse:  57 59 61  Temp:      TempSrc:      Resp: 16 18 15 17   SpO2:   100% 99%   General: Examined in the emergency department. Appears calm and comfortable Eyes: PERRL, normal lids, irises  ENT: grossly normal hearing, lips & tongue Neck: no LAD, masses or thyromegaly Cardiovascular: RRR, no m/r/g. No LE edema. Respiratory: CTA bilaterally,  no w/r/r. Normal respiratory effort. Abdomen: soft, ntnd Skin: Bruising over the right rib cage Musculoskeletal: grossly normal tone BUE/BLE Psychiatric: grossly normal mood and affect, speech fluent and appropriate Neurologic: grossly non-focal.  Wt Readings from Last 3 Encounters:  06/09/14 81.647 kg (180 lb)  08/26/13 84.55 kg (186 lb 6.4 oz)  05/03/13 86.637 kg (191 lb)    Labs on Admission:  Basic Metabolic Panel:  Recent Labs Lab 06/09/14 1312 06/10/14 0534 06/11/14 0639 06/15/14 0045  NA 134* 135 131* 125*  K 4.2 3.8 3.8 4.4  CL 98 102 101 94*  CO2 25 25 24 22   GLUCOSE 126* 99 98 109*  BUN 70* 48* 35* 53*  CREATININE 1.29 0.76 0.86 2.03*  CALCIUM 9.4 8.5 8.2* 8.3*    Liver Function Tests:  Recent Labs Lab 06/09/14 1312 06/10/14 0534 06/15/14 0045  AST 68* 54* 58*  ALT 35 32 40  ALKPHOS 100 78 84  BILITOT 1.4* 1.2 1.3*  PROT 6.9 5.6*  6.2  ALBUMIN 3.2* 2.6* 3.1*    Recent Labs Lab 06/15/14 0045  LIPASE 49    CBC:  Recent Labs Lab 06/09/14 1312 06/10/14 0534 06/15/14 0045  WBC 12.6* 10.3 19.1*  NEUTROABS 11.0*  --  16.3*  HGB 15.5 13.2 14.3  HCT 45.2 39.2 41.6  MCV 85.6 86.5 84.9  PLT 300 258 356    Cardiac Enzymes:  Recent Labs Lab 06/09/14 1312 06/09/14 1709 06/09/14 2239 06/10/14 0534 06/15/14 0045 06/15/14 0545  CKTOTAL 1141*  --   --  626*  --  938*  TROPONINI <0.03 <0.03 <0.03 <0.03 <0.03  --     Radiological Exams on Admission: Ct Abdomen Pelvis Wo Contrast  06/15/2014   CLINICAL DATA:  Acute abdominal pain with diarrhea  EXAM: CT ABDOMEN AND PELVIS WITHOUT CONTRAST  TECHNIQUE: Multidetector CT imaging of the abdomen and pelvis was performed following the standard protocol without IV contrast.  COMPARISON:  None.  FINDINGS: BODY WALL: Unremarkable.  LOWER CHEST: Right ventricular pacer lead.  Trace right pleural effusion with mild atelectasis. This is presumably reactive to a mildly displaced right 12 and 10 rib  fractures.  ABDOMEN/PELVIS:  Liver: No focal abnormality.  Biliary: Cholelithiasis.  No evidence of acute cholecystitis.  Pancreas: Unremarkable.  Spleen: Unremarkable.  Adrenals: Unremarkable.  Kidneys and ureters: Mild bilateral hydroureteronephrosis secondary to urinary bladder distention.  There is a large exophytic cyst from the right lower pole measuring 15 cm in maximal dimension. Mildly high-density 1 cm right upper pole and 2.5 cm left lower pole renal lesions likely reflect proteinaceous or hemorrhagic cysts.  Bladder: Small calculi layer within the distended bladder. Suspect chronic outlet obstruction.  Reproductive: Unremarkable.  Bowel: No obstruction. Normal appendix.  Retroperitoneum: There is a 2.5 cm nodule anterior to the duodenal bulb which is stable since 2007. The nodule is discrete from the liver, but contiguous with the duodenum. Although this portion the duodenum is flattened, no evidence of gastric outlet obstruction.  Peritoneum: No ascites or pneumoperitoneum.  Vascular: No acute abnormality.  OSSEOUS:  Moderately displaced right tenth and nondisplaced right twelfth rib fractures.  Advanced lumbar degenerative disc and facet disease with multilevel canal and foraminal stenosis.  IMPRESSION: 1. Bilateral hydronephrosis from urinary retention. Retention is likely chronic as there are multiple bladder calculi. 2. Displaced right tenth and twelfth rib fractures with small pleural effusion and atelectasis. 3. Cholelithiasis. 4. Bilateral renal cysts. There are high density renal lesions bilaterally which are likely hemorrhagic/proteinaceous cysts. If renal ultrasound performed to follow-up the hydronephrosis, recommend attention to these lesions. 5. 2.5 cm mass in the right upper quadrant, contiguous with the duodenum, is stable since 2007 and benign.   Electronically Signed   By: Tiburcio PeaJonathan  Watts M.D.   On: 06/15/2014 05:03   Dg Chest Port 1 View  06/15/2014   CLINICAL DATA:  Hypoxia and  weakness  EXAM: PORTABLE CHEST - 1 VIEW  COMPARISON:  06/09/2014  FINDINGS: There is a dual-chamber pacer from the left with leads in unchanged position.  Unchanged mild cardiomegaly and aortic tortuosity. The lungs are chronically hyperinflated. There is no edema, consolidation, effusion, or pneumothorax.  IMPRESSION: Stable exam.  No evidence of acute cardiopulmonary disease.   Electronically Signed   By: Tiburcio PeaJonathan  Watts M.D.   On: 06/15/2014 03:01    EKG: Independently reviewed. Atrially paced rhythm.   Principal Problem:   Acute kidney injury Active Problems:   Atrial fibrillation   Long term current use of anticoagulant therapy  Polyneuropathy in other diseases classified elsewhere   Dehydration   Sepsis   UTI (lower urinary tract infection)   Bilateral hydronephrosis   Bladder calculi   Rhabdomyolysis   Hyponatremia   Multiple rib fractures   Hypotension   S/P placement of cardiac pacemaker   Dementia   Assessment/Plan 1. Acute kidney injury, multifactorial: Obstructive uropathy, multiple bladder calculi, dehydration, rhabdomyolysis. 2. Bilateral hydronephrosis with multiple bladder calculi 3. Rhabdomyolysis 4. Hyponatremia, hyperchloremia, dehydration. 5. UTI with hypotension, leukocytosis; possible sepsis. Lactic acid is normal. Given clinical scenario would suspect demargination as etiology for leukocytosis and dehydration as etiology for hypotension. 6. Displaced right 10th, nondisplaced 12th rib fractures with small pleural effusion and atelectasis on CT; likely traumatic from fall prior to admission 12/24 (according to H&P at that time he had fallen 1 week prior and one day prior to that admission) 7. Bilateral renal cysts. Consider follow-up with renal ultrasound. 8. Multiple sacral decubitus ulcers present on admission 9. Atrial fibrillation, status post pacemaker. On warfarin and sotalol. 10. Obstructive sleep apnea 11. Chronic dementia, essential tremor,  polyneuropathy ementia, B12 deficiency, gait abnormality   Admit to stepdown. IV fluids, continue Foley catheter, follow BMP and CK. No lower urology available this week. As long as bladder is decompressed and urine output is adequate suggest outpatient follow-up.  Empiric antibiotics, follow-up urine culture. Trendblood pressure. CBC in the morning.  No apparent sequela from multiple rib fractures in the last few weeks. Monitor for significant hypoxia, respiratory compromise. No evidence of pneumonia at this time.  Skin care, wound consult  Check PT/INR. Stop warfarin. Multiple falls at home, acute rib fractures.  Follow-up urine culture.  Code Status: full code DVT prophylaxis: heparin Family Communication: none Disposition Plan/Anticipated LOS: admit SDU, 2-3 days  Time spent: 60 minutes  Brendia Sacks, MD  [facility] Pager [phone] 06/15/2014, 7:56 AM

## 2014-06-15 NOTE — Progress Notes (Signed)
UR chart review completed.  

## 2014-06-15 NOTE — Consult Note (Signed)
WOC wound consult note Reason for Consult: evaluation of sacral, scrotal, and penile wounds.  Pt reports he has had some falls recently and was unable to get up from the floor.  Unclear on the length of time in the floor with the most recent fall.  He also reports that about one week prior to this he was experiencing weakness.  I noted a previous admission  06/09/14 with DC 06/11/14.  He reports since that time he has not been able to get up from his chair.  He has an indwelling FC and had incontinence of his bowels while at home.  The areas of concern on his sacrum and buttocks are documented at the time of his admission however they appear to have extended now and this is now one large DTI (deep tissue injury) over the sacrum and buttocks certainly the worsening of this wound would be expected with the prolonged immobility and exposure to feces.  He also has a new area of pressure on the posterior scrotum from the pressure of the scrotum against the recliner and lack of movement. He has an area on the penis that appears to be healing related to device pressure from his FC.   Wound type: sDTI (suspected deep tissue injury) sacrum and buttocks sDTI scrotum Stage II Pressure ulcer penis  Pressure Ulcer POA: Yes Measurement: Sacrum and buttocks-one large area now 19cm x 10.5cm x 0.2cm  Scrotum: 5cm x 2cm x 0  Wound WGN:FAOZbed:both areas (scrotum/buttocks/sacrum) with deep, dark maroon tissue consistent with DTI. Some superficial partial thickness skin loss at the periphery of the buttock wound and some noted on the scrotum as well.  Drainage (amount, consistency, odor)  Periwound: intact but erythematous  Dressing procedure/placement/frequency: Barrier cream only for the scrotum due to location will be very difficult to maintain topical care. Will need close monitoring of this area by the nursing staff to evaluate for changes.  The sacrum and buttocks will add foam dressing to protect this area from further  injury but will need the nursing staff to monitor this area very closely as I feel this area will be very likely to progress to unstageable pressure ulcer and need debridement.  I have added a SPORT mattress while in the ICU and recommended an air mattress if he discharges to a medical bed. I will order a chair pressure redistribution pad to be use when the patient is up in the chair and to be sent with the patient at the time of dc to be used.  This patient would definitely benefit from a higher level of care than his current home situation to avoid these wounds worsening.   Discussed POC with patient and bedside nurse.  Re consult if needed, will not follow at this time. Thanks  Kishia Shackett [employer] RN, CWOCN [phone])

## 2014-06-15 NOTE — Care Management Utilization Note (Signed)
UR complete 

## 2014-06-15 NOTE — ED Notes (Signed)
Jon [name] pt. Son can be reached at [phone]

## 2014-06-15 NOTE — ED Notes (Signed)
Ok to transfer to ICU bed 10, per Thurnell LoseAnna Stone, RN

## 2014-06-15 NOTE — ED Notes (Signed)
Pt had large soft brown foul smelling stool.  Two previous dressing to buttock removed.  Sacral area has black necrotic tissue, unstageable.   Both buttocks with partial thicken and areas to scrotum with partial thicken.  Area surrounding open areas with redness noted.  Barrier cream applied and pt repositioned to left side.

## 2014-06-15 NOTE — ED Notes (Signed)
Hospitalist at bedside.  Informed of pt's wound to buttock and pt only has barrier cream. Plan to address during admission.

## 2014-06-15 NOTE — Progress Notes (Signed)
ANTIBIOTIC CONSULT NOTE-Preliminary  Pharmacy Consult for Cefepime and vancomycin Indication: Sepsis  Allergies  Allergen Reactions  . Iohexol      Desc: PT STATES THROAT/FACE SWELLING W/ IVP DYE IN 1990. PT HAS NOT HAD ANY EXAMS DONE W/DYE SINCE AND HAS NEVER BEEN PRE MEDICATED. PER PT, Onset Date: [id number]   . Dilantin [Phenytoin Sodium Extended] Rash  . Tegretol [Carbamazepine] Rash    Patient Measurements:    Vital Signs: Temp: 97.8 F (36.6 C) (12/30 0413) Temp Source: Rectal (12/30 0413) BP: 104/90 mmHg (12/30 0430) Pulse Rate: 59 (12/30 0515)  Labs:  Recent Labs  06/15/14 0045  WBC 19.1*  HGB 14.3  PLT 356  CREATININE 2.03*    Estimated Creatinine Clearance: 30.5 mL/min (by C-G formula based on Cr of 2.03).  No results for input(s): VANCOTROUGH, VANCOPEAK, VANCORANDOM, GENTTROUGH, GENTPEAK, GENTRANDOM, TOBRATROUGH, TOBRAPEAK, TOBRARND, AMIKACINPEAK, AMIKACINTROU, AMIKACIN in the last 72 hours.   Microbiology: Recent Results (from the past 720 hour(s))  Clostridium Difficile by PCR     Status: None   Collection Time: 06/15/14  4:06 AM  Result Value Ref Range Status   C difficile by pcr NEGATIVE NEGATIVE Final    Medical History: Past Medical History  Diagnosis Date  . Heart disease   . Atrial fibrillation     WITH PACEMAKER  . OSA (obstructive sleep apnea)   . Memory loss   . Bleeding ulcer   . B12 deficiency   . Hypertension   . Abnormality of gait 05/03/2013  . Essential and other specified forms of tremor 05/03/2013  . Polyneuropathy in other diseases classified elsewhere 05/03/2013  . Peripheral edema   . Varicose veins     Bilateral  . Rib fracture   . Macular degeneration of left eye     Medications:   Assessment: 80 yo male admitted with weakness, diarrhea, abdominal pain and urinary incontinence. Pt has multiple decubitus ulcers, elevated WBCs, and UTI. Broad spectrum antibiotic coverage to be started for possible sepsis.  Goal of  Therapy:  Eradicate infection  Plan:  Preliminary review of pertinent patient information completed.  Protocol will be initiated with a one-time doses of cefepime 1 Gm IV and vancomycin 1 Gm IV.  Jeani HawkingAnnie [name] clinical pharmacist will complete review during morning rounds to assess patient and finalize treatment regimen.  Arelia SneddonMason, Tagan Bartram Anne, RPH 06/15/2014,5:55 AM ./

## 2014-06-15 NOTE — Evaluation (Signed)
Pt found in his own feces and urine at home. Was discharged 3-4 days ago because of dehydration. Was found sitting in the same chair that he sat in when he came home from hospital.   Multiple excoriations over back and scrotal area. Cleaned in ER.  WBC 19. Cr 2.03. Na 125. Cr 2.03. UA concerning for infection.  Urinary retention on CT. SBP in 90s-100s.  Asked EDP to place pt on vanc and cefepime for sepsis coverage Foley placed given AKI and urinary retention Stepdown bed  Pan culture Lactate   Filed Vitals:   06/15/14 0515  BP:   Pulse: 59  Temp:   Resp: 15  BP 90s-100s.  Ct Abdomen Pelvis Wo Contrast  06/15/2014   CLINICAL DATA:  Acute abdominal pain with diarrhea  EXAM: CT ABDOMEN AND PELVIS WITHOUT CONTRAST  TECHNIQUE: Multidetector CT imaging of the abdomen and pelvis was performed following the standard protocol without IV contrast.  COMPARISON:  None.  FINDINGS: BODY WALL: Unremarkable.  LOWER CHEST: Right ventricular pacer lead.  Trace right pleural effusion with mild atelectasis. This is presumably reactive to a mildly displaced right 12 and 10 rib fractures.  ABDOMEN/PELVIS:  Liver: No focal abnormality.  Biliary: Cholelithiasis.  No evidence of acute cholecystitis.  Pancreas: Unremarkable.  Spleen: Unremarkable.  Adrenals: Unremarkable.  Kidneys and ureters: Mild bilateral hydroureteronephrosis secondary to urinary bladder distention.  There is a large exophytic cyst from the right lower pole measuring 15 cm in maximal dimension. Mildly high-density 1 cm right upper pole and 2.5 cm left lower pole renal lesions likely reflect proteinaceous or hemorrhagic cysts.  Bladder: Small calculi layer within the distended bladder. Suspect chronic outlet obstruction.  Reproductive: Unremarkable.  Bowel: No obstruction. Normal appendix.  Retroperitoneum: There is a 2.5 cm nodule anterior to the duodenal bulb which is stable since 2007. The nodule is discrete from the liver, but contiguous with the  duodenum. Although this portion the duodenum is flattened, no evidence of gastric outlet obstruction.  Peritoneum: No ascites or pneumoperitoneum.  Vascular: No acute abnormality.  OSSEOUS:  Moderately displaced right tenth and nondisplaced right twelfth rib fractures.  Advanced lumbar degenerative disc and facet disease with multilevel canal and foraminal stenosis.  IMPRESSION: 1. Bilateral hydronephrosis from urinary retention. Retention is likely chronic as there are multiple bladder calculi. 2. Displaced right tenth and twelfth rib fractures with small pleural effusion and atelectasis. 3. Cholelithiasis. 4. Bilateral renal cysts. There are high density renal lesions bilaterally which are likely hemorrhagic/proteinaceous cysts. If renal ultrasound performed to follow-up the hydronephrosis, recommend attention to these lesions. 5. 2.5 cm mass in the right upper quadrant, contiguous with the duodenum, is stable since 2007 and benign.   Electronically Signed   By: Tiburcio PeaJonathan  Watts M.D.   On: 06/15/2014 05:03   Dg Chest Port 1 View  06/15/2014   CLINICAL DATA:  Hypoxia and weakness  EXAM: PORTABLE CHEST - 1 VIEW  COMPARISON:  06/09/2014  FINDINGS: There is a dual-chamber pacer from the left with leads in unchanged position.  Unchanged mild cardiomegaly and aortic tortuosity. The lungs are chronically hyperinflated. There is no edema, consolidation, effusion, or pneumothorax.  IMPRESSION: Stable exam.  No evidence of acute cardiopulmonary disease.   Electronically Signed   By: Tiburcio PeaJonathan  Watts M.D.   On: 06/15/2014 03:01

## 2014-06-16 DIAGNOSIS — D649 Anemia, unspecified: Secondary | ICD-10-CM

## 2014-06-16 DIAGNOSIS — D638 Anemia in other chronic diseases classified elsewhere: Secondary | ICD-10-CM | POA: Insufficient documentation

## 2014-06-16 LAB — BASIC METABOLIC PANEL
Anion gap: 6 (ref 5–15)
BUN: 35 mg/dL — ABNORMAL HIGH (ref 6–23)
CALCIUM: 7.3 mg/dL — AB (ref 8.4–10.5)
CO2: 20 mmol/L (ref 19–32)
Chloride: 106 mEq/L (ref 96–112)
Creatinine, Ser: 1.01 mg/dL (ref 0.50–1.35)
GFR calc Af Amer: 79 mL/min — ABNORMAL LOW (ref >90)
GFR calc non Af Amer: 68 mL/min — ABNORMAL LOW (ref >90)
Glucose, Bld: 118 mg/dL — ABNORMAL HIGH (ref 70–99)
POTASSIUM: 3.6 mmol/L (ref 3.5–5.1)
Sodium: 132 mmol/L — ABNORMAL LOW (ref 135–145)

## 2014-06-16 LAB — CBC
HCT: 30.8 % — ABNORMAL LOW (ref 39.0–52.0)
Hemoglobin: 10.3 g/dL — ABNORMAL LOW (ref 13.0–17.0)
MCH: 28.9 pg (ref 26.0–34.0)
MCHC: 33.4 g/dL (ref 30.0–36.0)
MCV: 86.3 fL (ref 78.0–100.0)
Platelets: 207 10*3/uL (ref 150–400)
RBC: 3.57 MIL/uL — AB (ref 4.22–5.81)
RDW: 15.1 % (ref 11.5–15.5)
WBC: 9.8 10*3/uL (ref 4.0–10.5)

## 2014-06-16 LAB — MAGNESIUM: MAGNESIUM: 1.7 mg/dL (ref 1.5–2.5)

## 2014-06-16 MED ORDER — HYDROCODONE-ACETAMINOPHEN 10-325 MG PO TABS
1.0000 | ORAL_TABLET | Freq: Four times a day (QID) | ORAL | Status: DC | PRN
  Administered 2014-06-16 – 2014-06-18 (×4): 1 via ORAL
  Filled 2014-06-16 (×4): qty 1

## 2014-06-16 MED ORDER — DIPHENOXYLATE-ATROPINE 2.5-0.025 MG PO TABS
1.0000 | ORAL_TABLET | Freq: Four times a day (QID) | ORAL | Status: DC | PRN
  Administered 2014-06-16: 1 via ORAL
  Filled 2014-06-16: qty 1

## 2014-06-16 MED ORDER — ALPRAZOLAM 1 MG PO TABS
1.0000 mg | ORAL_TABLET | Freq: Two times a day (BID) | ORAL | Status: DC | PRN
Start: 2014-06-16 — End: 2014-06-18
  Administered 2014-06-17: 1 mg via ORAL
  Filled 2014-06-16: qty 1

## 2014-06-16 NOTE — Care Management Note (Signed)
    Page 1 of 1   06/16/2014     12:44:47 PM CARE MANAGEMENT NOTE 06/16/2014  Patient:  Luciano CutterLAMBERTH,Ervey P   Account Number:  [id number]  Date Initiated:  06/16/2014  Documentation initiated by:  Kathyrn SheriffHILDRESS,JESSICA  Subjective/Objective Assessment:   Pt is from home with wife and son. At baseline pt is independent and cares for dependent wife. [facility]     Action/Plan:   Pt plans for dishcarged to SNF for rehab. CSW is aware of plan and will arrange for discharge to SNF. Pt has no CM needs at this time.   Anticipated DC Date:  06/18/2014   Anticipated DC Plan:  SKILLED NURSING FACILITY  In-house referral  Clinical Social Worker      DC Planning Services  CM consult      Choice offered to / List presented to:             Status of service:  Completed, signed off Medicare Important Message given?   (If response is "NO", the following Medicare IM given date fields will be blank) Date Medicare IM given:   Medicare IM given by:   Date Additional Medicare IM given:   Additional Medicare IM given by:    Discharge Disposition:  SKILLED NURSING FACILITY  Per UR Regulation:  Reviewed for med. necessity/level of care/duration of stay  If discussed at Long Length of Stay Meetings, dates discussed:    Comments:  06/16/2014 1200 Kathyrn SheriffJessica Childress, RN, MSN, [facility]

## 2014-06-16 NOTE — Progress Notes (Signed)
PROGRESS NOTE  [name] Swetz ZOX:[id number] DOB: June 14, 1932 DOA: 06/14/2014 PCP: Milana ObeyKNOWLTON,STEPHEN D, MD  Summary: 80 year old man presented with generalized weakness, very limited movement in the last 3 days with very limited oral intake. Found in recliner chair in own feces and urine. Found to have multiple decubitus ulcers with skin breakdown. CT revealed obstructive uropathy and Foley catheter was placed. Admitted for acute kidney injury, dehydration, UTI.  Assessment/Plan: 1. Acute kidney injury, multifactorial. Resolved. Nonoliguric. Predominantly obstructive uropathy, multiple bladder calculi with superimposed dehydration and rhabdomyolysis. 2. Bilateral hydronephrosis with multiple bladder calculi. 3. Rhabdomyolysis. Clinically improved. Urine output excellent. Renal function is normalized. 4. Hyponatremia secondary to dehydration resolving with IV fluids. 5. UTI, with hypotension, leukocytosis. Sepsis considered on admission, possible sepsis but lactic acid was normal. Leukocytosis has resolved, remains afebrile but blood pressure slightly low. Suspect demargination secondary to acute illness and volume depletion/dehydration as etiology for hypotension. 6. Diarrhea. cdiff negative. Monitor.  7. Displaced right 10th, nondisplaced 12th rib fractures without evidence of complicating features. Asymptomatic. Traumatic for fall prior to admission 12/24. 8. Normocytic anemia. Significant drop from admission, no evidence of bleeding or history thereof. Suspect was significantly hemoconcentrated on admission (leukocytosis also resolved). Guaiac positive, however has significant sacral and scrotal wounds with some bleeding. 9. Scrotal wounds present on admission. Secondary to prolonged immobility in recliner for several days, sitting in own urine and feces. Barrier cream. Monitor daily for changes. 10. Sacral, buttock ulcers. Etiology as above. Monitor closely for progression. Utilize SPORT  mattress, air mattress, chair pressure redistribution pattern. 11. Bilateral renal cysts. 12. Atrial fibrillation status post pacemaker. Stable, continue sotalol. Stop warfarin for now given history of multiple falls with traumatic rib fractures. 13. Chronic dementia, essential tremor, polyneuropathy, B12 deficiency, gait abnormality.   Renal failure has resolved. Plan to continue Foley catheter. No urology available this week. Plan follow-up with urology as an outpatient for further evaluation of obstructive uropathy, renal cysts and multiple bladder stones.  Continue Rocephin. Follow-up urine culture.  Monitor for bleeding, trend CBC, check anemia panel.  Continue wound care.  Physical therapy evaluation. Suspect will need SNF.  Code Status: full DVT prophylaxis: heparin Family Communication: none Disposition Plan: pending  Brendia Sacksaniel Calista Crain, MD  [facility]  Pager [phone] If 7PM-7AM, please contact night-coverage at [email or web address], password [email or web address] 06/16/2014, 8:43 AM  LOS: 2 days   Consultants:  PT: SNF  Procedures:    Antibiotics:  Ceftriaxone 12/30 >>  HPI/Subjective: Wound care recommendations noted. PT recommending SNF.  Feels much better.  Sleeping: well Eating/GI: very well, no n/v/pain. Reports diarrhea. Breathing: fine Pain: arthritis pain in joints Desires: none  Objective: Filed Vitals:   06/16/14 0600 06/16/14 0700 06/16/14 0738 06/16/14 0800  BP: 93/45 96/50  91/46  Pulse: 63 60  67  Temp:   98.5 F (36.9 C)   TempSrc:   Oral   Resp: 13 16  17   Height:      Weight:      SpO2: 97% 100%  100%    Intake/Output Summary (Last 24 hours) at 06/16/14 0843 Last data filed at 06/16/14 0800  Gross per 24 hour  Intake 4212.5 ml  Output   1150 ml  Net 3062.5 ml     Filed Weights   06/15/14 1100 06/16/14 0500  Weight: 86 kg (189 lb 9.5 oz) 86 kg (189 lb 9.5 oz)    Exam:    Afebrile, systolic blood pressure now in the 90s, no hypoxia.  Normal heart rate. General:  Appears calm and comfortable. Appears much better. Cardiovascular: RRR, no m/r/g. No LE edema. Telemetry: SR, no arrhythmias  Respiratory: CTA bilaterally, no w/r/r. Normal respiratory effort. Abdomen: soft, ntnd Musculoskeletal: grossly normal tone BUE/BLE Psychiatric: grossly normal mood and affect, speech fluent and appropriate Neurologic: grossly non-focal.  Data Reviewed:  Urine output 1375  Sodium 125 >> 132  BUN 53 >> 35. Creatinine 2.03 >> 1.01  Potassium and magnesium normal.   Hemoglobin 14.3 >> 10.3   INR 2.40  Pertinent data: Admission Labs Creatinine 2.03, sodium 125 Imaging  CT abdomen and pelvis: Bilateral hydronephrosis from urinary retention. Multiple bladder calculi. Displaced right 10th and 12th rib fractures with small pleural effusion. Bilateral renal cysts.  Chest x-ray no acute disease  Pending data:  Urine culture   Scheduled Meds: . antiseptic oral rinse  7 mL Mouth Rinse BID  . cefTRIAXone (ROCEPHIN)  IV  1 g Intravenous Q24H  . heparin  5,000 Units Subcutaneous 3 times per day  . sodium chloride  3 mL Intravenous Q12H  . sotalol  160 mg Oral BID   Continuous Infusions: . sodium chloride 150 mL/hr at 06/16/14 0800    Principal Problem:   Acute kidney injury Active Problems:   Atrial fibrillation   Long term current use of anticoagulant therapy   Polyneuropathy in other diseases classified elsewhere   Dehydration   Sepsis   UTI (lower urinary tract infection)   Bilateral hydronephrosis   Bladder calculi   Rhabdomyolysis   Hyponatremia   Multiple rib fractures   S/P placement of cardiac pacemaker   Dementia   AKI (acute kidney injury)   Anemia   Time spent 20 minutes

## 2014-06-16 NOTE — Progress Notes (Signed)
Called report to Lindsi, Charity fundraiserN on dept 300.  Verbalized understanding.  Pt transferred to rm 331 in safe and stable condition. Schonewitz, Candelaria StagersLeigh Anne 06/16/2014

## 2014-06-16 NOTE — Clinical Social Work Psychosocial (Signed)
Clinical Social Work Department BRIEF PSYCHOSOCIAL ASSESSMENT 06/16/2014  Patient:  Jon [name], Jon [name]     Account Number:  [id number]     Admit date:  06/14/2014  Clinical Social Worker:  Legrand Como  Date/Time:  06/16/2014 12:27 PM  Referred by:  CSW  Date Referred:  06/16/2014 Referred for  SNF Placement   Other Referral:   Interview type:  Patient Other interview type:    PSYCHOSOCIAL DATA Living Status:  FAMILY Admitted from facility:   Level of care:   Primary support name:  Jon [name] Primary support relationship to patient:  CHILD, ADULT Degree of support available:   Patient's son and wife are supportive.    CURRENT CONCERNS Current Concerns  Post-Acute Placement   Other Concerns:    SOCIAL WORK ASSESSMENT / PLAN CSW met with patient who was alert and oriented.  Patient indicated that her resides in the home with his wife and adult son, Jon [name].  He indicated that his son assists with his wife who has a feeding tube.  Patient indicated that he uses a cane to ambulate.  He stated that he was able to complete ADL's independently up until about two weeks ago when he became ill. Patient also stated that he drove and when to the grocery store prior to his recent illness. He indicated that about two weeks ago, he became weak and his son began having to help him into the bathtub. Patient indicated that he would go to a SNF for rehab as he know his son cannot provide care to both him and his wife. He indicated that he would prefer to go to [facility] or [facility]. CSW provided patient with SNF listing.  CSW obtained PASSAR number and faxed clinicals to both [facility] and [facility].   Assessment/plan status:  Referral to [employer] Other assessment/ plan:   Information/referral to community resources:    PATIENT'S/FAMILY'S RESPONSE TO PLAN OF CARE: Patient willing to go to rehab at [facility].   Jon [name], Jon [name]

## 2014-06-16 NOTE — Evaluation (Signed)
Physical Therapy Evaluation Patient Details Name: Jon [name] MRN: [id number] DOB: May 02, 1932 Today's Date: 06/16/2014   History of Present Illness  80 year old man presented with generalized weakness, very limited movement in the last 3 days with very limited oral intake. Found in recliner chair in own feces and urine. Found to have multiple decubitus ulcers with skin breakdown. CT revealed obstructive uropathy and Foley catheter was placed. Admitted for acute kidney injury, dehydration, UTI.  He lives with his wife and son. His wife is on a feeding tube and requires extensive care.  He and his son have been caring for her.  Pt states that he has worn himself out from her care.  He is normally independent with a cane.  He states that his balance has been bad for years.  Clinical Impression  Pt was seen for evaluation.  He was alert, oriented and very pleasant.  He was found to be profoundly weak on muscle test and required max assist to transfer to edge of bed.  Sitting balance is good.  I was unable to transfer him to stance with max assist and a walker.  A maxi lift will need to be used to transfer bed to chair.  Pt will need transfer to SNF at d/c and he is understanding and agreeable to this.  We will focus on increasing strength and transfers while in house.    Follow Up Recommendations SNF    Equipment Recommendations  None recommended by PT    Recommendations for Other Services   OT    Precautions / Restrictions Precautions Precautions: Fall Precaution Comments: multiple sacral wounds of which to be cautious...to use a pressure relief pad while in chair Restrictions Weight Bearing Restrictions: No      Mobility  Bed Mobility Overal bed mobility: Needs Assistance Bed Mobility: Supine to Sit     Supine to sit: Max assist;HOB elevated     General bed mobility comments: pt unable to move LEs in the bed or transfer trunk anteriorly without max assist  Transfers                  General transfer comment: we were unable to get pt to stance (even with bed elevated)...will need maxi lift for transfers   Ambulation/Gait Ambulation/Gait assistance:  (unable)              Stairs            Wheelchair Mobility    Modified Rankin (Stroke Patients Only)       Balance Overall balance assessment:  (sitting balance is good with no hand held support)                                           Pertinent Vitals/Pain Pain Assessment: No/denies pain    Home Living Family/patient expects to be discharged to:: Skilled nursing facility                      Prior Function Level of Independence: Independent with assistive device(s)         Comments: very recently has had falls and progressive weakness at home     Hand Dominance   Dominant Hand: Right    Extremity/Trunk Assessment   Upper Extremity Assessment: Generalized weakness           Lower Extremity Assessment: RLE deficits/detail;LLE deficits/detail  Cervical / Trunk Assessment: Kyphotic  Communication   Communication: No difficulties  Cognition Arousal/Alertness: Awake/alert Behavior During Therapy: WFL for tasks assessed/performed Overall Cognitive Status: Within Functional Limits for tasks assessed                      General Comments      Exercises        Assessment/Plan    PT Assessment Patient needs continued PT services  PT Diagnosis Difficulty walking;Generalized weakness   PT Problem List Decreased strength;Decreased activity tolerance;Decreased balance;Decreased mobility;Decreased knowledge of use of DME;Impaired sensation;Decreased skin integrity  PT Treatment Interventions Functional mobility training;Therapeutic exercise;Therapeutic activities;Patient/family education   PT Goals (Current goals can be found in the Care Plan section) Acute Rehab PT Goals Patient Stated Goal: to return home to  independence PT Goal Formulation: With patient Time For Goal Achievement: 06/16/14 Potential to Achieve Goals: Good    Frequency Min 3X/week   Barriers to discharge Decreased caregiver support      Co-evaluation               End of Session Equipment Utilized During Treatment: Gait belt Activity Tolerance: Patient limited by fatigue Patient left: in bed;with call bell/phone within reach Nurse Communication: Mobility status         Time: [id number] PT Time Calculation (min) (ACUTE ONLY): 47 min   Charges:   PT Evaluation $Initial PT Evaluation Tier I: 1 Procedure     PT G CodesMyrlene Broker:        Teandra Harlan L 06/16/2014, 10:04 AM

## 2014-06-16 NOTE — Clinical Social Work Placement (Signed)
Clinical Social Work Department CLINICAL SOCIAL WORK PLACEMENT NOTE 06/16/2014  Patient:  Jon [name],Jon [name]  Account Number:  [id number] Admit date:  06/14/2014  Clinical Social Worker:  Tretha SciaraHEATHER Kyran Connaughton, LCSW  Date/time:  06/16/2014 12:37 PM  Clinical Social Work is seeking post-discharge placement for this patient at the following level of care:   SKILLED NURSING   (*CSW will update this form in Epic as items are completed)   06/16/2014  Patient/family provided with [facility] of Clinical Social Work's list of facilities offering this level of care within the geographic area requested by the patient (or if unable, by the patient's family).  06/16/2014  Patient/family informed of their freedom to choose among providers that offer the needed level of care, that participate in Medicare, Medicaid or managed care program needed by the patient, have an available bed and are willing to accept the patient.  06/16/2014  Patient/family informed of [facility]' ownership interest in [facility], as well as of the fact that they are under no obligation to receive care at this facility.  PASARR submitted to EDS on 06/16/2014 PASARR number received on 06/16/2014  FL2 transmitted to all facilities in geographic area requested by pt/family on  06/16/2014 FL2 transmitted to all facilities within larger geographic area on   Patient informed that his/her managed care company has contracts with or will negotiate with  certain facilities, including the following:     Patient/family informed of bed offers received:   Patient chooses bed at  Physician recommends and patient chooses bed at    Patient to be transferred to  on   Patient to be transferred to facility by  Patient and family notified of transfer on  Name of family member notified:    The following physician request were entered in Epic:   Additional Comments:   Tretha SciaraHeather Nyoka Alcoser, LCSW [phone]

## 2014-06-17 ENCOUNTER — Encounter (HOSPITAL_COMMUNITY): Payer: Self-pay | Admitting: *Deleted

## 2014-06-17 DIAGNOSIS — I480 Paroxysmal atrial fibrillation: Secondary | ICD-10-CM

## 2014-06-17 DIAGNOSIS — F0390 Unspecified dementia without behavioral disturbance: Secondary | ICD-10-CM

## 2014-06-17 LAB — CBC
HCT: 30.5 % — ABNORMAL LOW (ref 39.0–52.0)
HEMOGLOBIN: 10.1 g/dL — AB (ref 13.0–17.0)
MCH: 28.7 pg (ref 26.0–34.0)
MCHC: 33.1 g/dL (ref 30.0–36.0)
MCV: 86.6 fL (ref 78.0–100.0)
PLATELETS: 200 10*3/uL (ref 150–400)
RBC: 3.52 MIL/uL — AB (ref 4.22–5.81)
RDW: 15.1 % (ref 11.5–15.5)
WBC: 9.6 10*3/uL (ref 4.0–10.5)

## 2014-06-17 LAB — RETICULOCYTES
RBC.: 3.52 MIL/uL — ABNORMAL LOW (ref 4.22–5.81)
Retic Count, Absolute: 45.8 10*3/uL (ref 19.0–186.0)
Retic Ct Pct: 1.3 % (ref 0.4–3.1)

## 2014-06-17 MED ORDER — SODIUM CHLORIDE 0.9 % IV SOLN
INTRAVENOUS | Status: DC
  Administered 2014-06-17 (×2): via INTRAVENOUS

## 2014-06-17 MED ORDER — HYDROCODONE-ACETAMINOPHEN 10-325 MG PO TABS: 1.0000 | ORAL_TABLET | Freq: Four times a day (QID) | ORAL | Status: DC | PRN

## 2014-06-17 NOTE — Clinical Social Work Note (Signed)
Pt accepts bed offer at [facility]. Facility notified and can accept pt over weekend. Pt plans to call his son himself.   Derenda Fennel, [place] [phone]

## 2014-06-17 NOTE — Progress Notes (Signed)
PROGRESS NOTE  MADSEN RIDDLE KVQ:[id number] DOB: Mar 13, 1932 DOA: 06/14/2014 PCP: Milana Obey, MD  HPI/Recap of past 24 hours: Patient is an 81 year old male with generalized weakness and very limited movement found in his recliner chair on 12/29 in his own feces and urine with multiple to curious ulcers. CT scan noted obstructive uropathy and full catheter placed. Patient also noted to have UTI. Admitted to hospitalist service  Assessment/Plan: Principal Problem:   Acute kidney injury: Resolved. Nonoliguric. Likely mostly obstructive uropathy and noted multiple bladder calculi. Active Problems:   Atrial fibrillation: Status post pacemaker. Coumadin stopped secondary to multiple falls with trauma. On sotalol.   Long term current use of anticoagulant therapy   Dehydration: Resolved with IV fluids   Sepsis   UTI (lower urinary tract infection): Enterococcus, cultures pending   Bilateral hydronephrosis   Bladder calculi: Noted. No signs of obstruction now   Rhabdomyolysis: Improved with IV fluids. Renal function normal   Hyponatremia: Secondary to dehydration, resolved with IV fluids   Multiple rib fractures: Traumatic fall prior to admission 12/24   S/P placement of cardiac pacemaker   Dementia: Chronic. Essential tremor. Skilled nursing placement.   Code Status: Full code  Family Communication: No family  Disposition Plan: Skilled nursing unit tomorrow or Monday, awaiting urine cultures   Consultants:  None  Procedures:  None  Antibiotics:  IV Rocephin and 12/30   Objective: BP 119/70 mmHg  Pulse 69  Temp(Src) 97.7 F (36.5 C) (Oral)  Resp 18  Ht  (1.778 m)  Wt 86 kg (189 lb 9.5 oz)  BMI 27.20 kg/m2  SpO2 98%  Intake/Output Summary (Last 24 hours) at 06/17/14 1558 Last data filed at 06/17/14 1504  Gross per 24 hour  Intake    603 ml  Output   1800 ml  Net  -1197 ml   Filed Weights   06/15/14 1100 06/16/14 0500  Weight: 86 kg (189 lb  9.5 oz) 86 kg (189 lb 9.5 oz)    Exam:   General:  Alert and oriented 2, no acute distress  Cardiovascular: Regular rate and rhythm, S1-S2  Respiratory: Clear to auscultation bilaterally  Abdomen: Soft, nontender, nondistended, positive bowel sounds  Musculoskeletal: No clubbing or cyanosis, trace edema   Data Reviewed: Basic Metabolic Panel:  Recent Labs Lab 06/11/14 0639 06/15/14 0045 06/16/14 0430  NA 131* 125* 132*  K 3.8 4.4 3.6  CL 101 94* 106  CO2 GLUCOSE 98 109* 118*  BUN 35* 53* 35*  CREATININE 0.86 2.03* 1.01  CALCIUM 8.2* 8.3* 7.3*  MG  --   --  1.7   Liver Function Tests:  Recent Labs Lab 06/15/14 0045  AST 58*  ALT 40  ALKPHOS 84  BILITOT 1.3*  PROT 6.2  ALBUMIN 3.1*    Recent Labs Lab 06/15/14 0045  LIPASE 49   No results for input(s): AMMONIA in the last 168 hours. CBC:  Recent Labs Lab 06/15/14 0045 06/16/14 0430 06/17/14 0634  WBC 19.1* 9.8 9.6  NEUTROABS 16.3*  --   --   HGB 14.3 10.3* 10.1*  HCT 41.6 30.8* 30.5*  MCV 84.9 86.3 86.6  PLT 356 207 200   Cardiac Enzymes:    Recent Labs Lab 06/15/14 0045 06/15/14 0545  CKTOTAL  --  938*  TROPONINI <0.03  --    BNP (last 3 results) No results for input(s): PROBNP in the last 8760 hours. CBG: No results for input(s): GLUCAP in the last  168 hours.  Recent Results (from the past 240 hour(s))  Urine culture     Status: None (Preliminary result)   Collection Time: 06/15/14  2:40 AM  Result Value Ref Range Status   Specimen Description URINE, CATHETERIZED  Final   Special Requests NONE  Final   Colony Count   Final    >=100,000 COLONIES/ML Performed at [facility]    Culture   Final    ENTEROCOCCUS SPECIES Performed at [facility]    Report Status PENDING  Incomplete  Clostridium Difficile by PCR     Status: None   Collection Time: 06/15/14  4:06 AM  Result Value Ref Range Status   C difficile by pcr NEGATIVE NEGATIVE Final  MRSA  PCR Screening     Status: None   Collection Time: 06/15/14  2:00 PM  Result Value Ref Range Status   MRSA by PCR NEGATIVE NEGATIVE Final    Comment:        The GeneXpert MRSA Assay (FDA approved for NASAL specimens only), is one component of a comprehensive MRSA colonization surveillance program. It is not intended to diagnose MRSA infection nor to guide or monitor treatment for MRSA infections.      Studies: No results found.  Scheduled Meds: . antiseptic oral rinse  7 mL Mouth Rinse BID  . cefTRIAXone (ROCEPHIN)  IV  1 g Intravenous Q24H  . heparin  5,000 Units Subcutaneous 3 times per day  . sodium chloride  3 mL Intravenous Q12H  . sotalol  160 mg Oral BID    Continuous Infusions: . sodium chloride 50 mL/hr at 06/17/14 1146     Time spent: 25 minutes  Hollice Espy  [facility] Pager [phone]. If 7PM-7AM, please contact night-coverage at [email or web address], password [facility] 06/17/2014, 3:58 PM  LOS: 3 days

## 2014-06-17 NOTE — Plan of Care (Signed)
Problem: Phase III Progression Outcomes Goal: Foley discontinued Outcome: Not Met (add Reason) Foley in place

## 2014-06-17 NOTE — Progress Notes (Signed)
Physical Therapy Session Note  Patient Details  Name: Jon [name] MRN: [id number] Date of Birth: [birth year]  Today's Date: 06/17/2014 PT Individual Time:  - 1221-1245     Skilled Therapeutic Interventions/Progress Updates:    Session focus on bed mobiltiy training with rolling mod assistance for sacral pressure relief and LE strengthening with bed exercises.  Glut sets and partial bridges for sacral pressure relief.  Pt limited by pain in back and knees so increased time required to complete exercises.  Noted increased bleeding around IV, RN informed of bleeding and of pain.  Pt left in bed with call bell within reach.  Therapy Documentation Precautions:  Precautions Precautions: Fall Precaution Comments: multiple sacral wounds of which to be cautious...to use a pressure relief pad while in chair Restrictions Weight Bearing Restrictions: No  Pain: Pain Assessment Pain Assessment: 0-10 Pain Score: 7  Pain Type: Chronic pain Pain Location: Generalized Pain Orientation: Upper Pain Descriptors / Indicators: Aching Pain Frequency: Constant Pain Onset: On-going Patients Stated Pain Goal: 2 Pain Intervention(s): Medication (See eMAR) Mobility:  Rolling 5 reps Bil for bed mobility and sacral pressure relief Exercises: General Exercises - Lower Extremity Ankle Circles/Pumps: AROM;Both;10 reps;Supine Quad Sets: AROM;Both;10 reps;Supine Gluteal Sets: AROM;Both;Supine Heel Slides: AROM;Both;Supine Hip ABduction/ADduction: AROM;Both;10 reps;Supine Straight Leg Raises: AROM;Both;5 reps;Supine Other Exercises Other Exercises: partial bridges for gluteal strengthening and sacral pressure relief  See FIM for current functional status  Therapy/Group: Individual Therapy  Juel Burrow 06/17/2014, 12:45 PM

## 2014-06-17 NOTE — Clinical Social Work Placement (Signed)
Clinical Social Work Department CLINICAL SOCIAL WORK PLACEMENT NOTE 06/17/2014  Patient:  KHALEEL, BECKOM  Account Number:  [id number] Admit date:  06/14/2014  Clinical Social Worker:  Tretha Sciara, LCSW  Date/time:  06/16/2014 12:37 PM  Clinical Social Work is seeking post-discharge placement for this patient at the following level of care:   SKILLED NURSING   (*CSW will update this form in Epic as items are completed)   06/16/2014  Patient/family provided with [facility] Department of Clinical Social Work's list of facilities offering this level of care within the geographic area requested by the patient (or if unable, by the patient's family).  06/16/2014  Patient/family informed of their freedom to choose among providers that offer the needed level of care, that participate in Medicare, Medicaid or managed care program needed by the patient, have an available bed and are willing to accept the patient.  06/16/2014  Patient/family informed of [facility]' ownership interest in [facility], as well as of the fact that they are under no obligation to receive care at this facility.  PASARR submitted to EDS on 06/16/2014 PASARR number received on 06/16/2014  FL2 transmitted to all facilities in geographic area requested by pt/family on  06/16/2014 FL2 transmitted to all facilities within larger geographic area on   Patient informed that his/her managed care company has contracts with or will negotiate with  certain facilities, including the following:     Patient/family informed of bed offers received:  06/17/2014 Patient [facility] bed [facility] [place] Physician recommends and patient chooses bed [facility]  [facility]   Patient to be transferred to  on   Patient to be transferred to facility by  Patient and family notified of transfer on  Name of family member notified:    The following physician request were entered in Epic:   Additional Comments:  Derenda Fennel, LCSW [phone]

## 2014-06-17 NOTE — Evaluation (Signed)
Occupational Therapy Evaluation Patient Details Name: Jon [name] MRN: [id number] DOB: Mar 11, 1932 Today's Date: 06/17/2014    History of Present Illness 81 year old man presented with generalized weakness, very limited movement in the last 3 days with very limited oral intake. Found in recliner chair in own feces and urine. Found to have multiple decubitus ulcers with skin breakdown. CT revealed obstructive uropathy and Foley catheter was placed. Admitted for acute kidney injury, dehydration, UTI.   Clinical Impression   Pt admitted with acute kidney injury. Pt currently with functional limitations due to the deficits listed below (see OT Problem List). Pt will benefit from skilled OT to increase their safety and independence with ADL and functional mobility for ADL to facilitate discharge to venue listed below.       Follow Up Recommendations  SNF    Equipment Recommendations       Recommendations for Other Services       Precautions / Restrictions Precautions Precautions: Fall Precaution Comments: multiple sacral wounds of which to be cautious...to use a pressure relief pad while in chair Restrictions Weight Bearing Restrictions: No      Mobility Bed Mobility Overal bed mobility: Needs Assistance Bed Mobility: Rolling Rolling: Mod assist         General bed mobility comments: Cueing for hand and foot placement to assist with rollilng for sacral pressure relief  Transfers                 General transfer comment: maxilift needed    Balance                                            ADL Overall ADL's : At baseline                                       General ADL Comments: able to complete simulated grooming and upper body bathing in bed with HOB elevated.  Unable to complete LB dressing this date.      Vision                     Perception     Praxis      Pertinent Vitals/Pain Pain Assessment:  No/denies pain Pain Score: 7  Pain Location: Bil Elbows, Lt knee and back Pain Descriptors / Indicators: Aching;Throbbing     Hand Dominance Right   Extremity/Trunk Assessment Upper Extremity Assessment Upper Extremity Assessment: Overall WFL for tasks assessed           Communication Communication Communication: No difficulties   Cognition Arousal/Alertness: Awake/alert Behavior During Therapy: WFL for tasks assessed/performed Overall Cognitive Status: Within Functional Limits for tasks assessed                     General Comments       Exercises   Other Exercises Other Exercises: partial bridges for gluteal strengthening and sacral pressure relief   Shoulder Instructions      Home Living Family/patient expects to be discharged to:: Skilled nursing facility Living Arrangements: Spouse/significant other;Children                                      Prior Functioning/Environment  Level of Independence: Independent with assistive device(s)        Comments: very recently has had falls and progressive weakness at home    OT Diagnosis: Generalized weakness   OT Problem List: Decreased strength;Decreased activity tolerance   OT Treatment/Interventions: Self-care/ADL training;Therapeutic exercise;Therapeutic activities    OT Goals(Current goals can be found in the care plan section) Acute Rehab OT Goals Patient Stated Goal: to return home to independence OT Goal Formulation: With patient Time For Goal Achievement: 06/24/14 Potential to Achieve Goals: Good  OT Frequency: Min 2X/week   Barriers to D/C:            Co-evaluation              End of Session    Activity Tolerance: Patient tolerated treatment well Patient left: in bed;with call bell/phone within reach   Time: 1345-1355 OT Time Calculation (min): 10 min Charges:  OT General Charges $OT Visit: 1 Procedure OT Evaluation $Initial OT Evaluation Tier I: 1  Procedure G-Codes:    Shirlean Mylar, OTR/L [phone]  06/17/2014, 2:26 PM

## 2014-06-18 LAB — CBC
HCT: 32.1 % — ABNORMAL LOW (ref 39.0–52.0)
Hemoglobin: 10.8 g/dL — ABNORMAL LOW (ref 13.0–17.0)
MCH: 29.3 pg (ref 26.0–34.0)
MCHC: 33.6 g/dL (ref 30.0–36.0)
MCV: 87.2 fL (ref 78.0–100.0)
PLATELETS: 227 10*3/uL (ref 150–400)
RBC: 3.68 MIL/uL — AB (ref 4.22–5.81)
RDW: 15.1 % (ref 11.5–15.5)
WBC: 10.9 10*3/uL — ABNORMAL HIGH (ref 4.0–10.5)

## 2014-06-18 LAB — BASIC METABOLIC PANEL
ANION GAP: 3 — AB (ref 5–15)
BUN: 16 mg/dL (ref 6–23)
CO2: 25 mmol/L (ref 19–32)
CREATININE: 0.98 mg/dL (ref 0.50–1.35)
Calcium: 7.5 mg/dL — ABNORMAL LOW (ref 8.4–10.5)
Chloride: 102 mEq/L (ref 96–112)
GFR, EST AFRICAN AMERICAN: 88 mL/min — AB (ref >90)
GFR, EST NON AFRICAN AMERICAN: 76 mL/min — AB (ref >90)
Glucose, Bld: 120 mg/dL — ABNORMAL HIGH (ref 70–99)
Potassium: 4 mmol/L (ref 3.5–5.1)
Sodium: 130 mmol/L — ABNORMAL LOW (ref 135–145)

## 2014-06-18 MED ORDER — OXYCODONE HCL 5 MG PO TABS: 2.5000 mg | ORAL_TABLET | Freq: Four times a day (QID) | ORAL | Status: DC | PRN

## 2014-06-18 MED ORDER — AMOXICILLIN-POT CLAVULANATE 875-125 MG PO TABS
1.0000 | ORAL_TABLET | Freq: Two times a day (BID) | ORAL | Status: DC
  Administered 2014-06-18: 1 via ORAL
  Filled 2014-06-18: qty 1

## 2014-06-18 MED ORDER — ALPRAZOLAM 1 MG PO TABS: 0.5000 mg | ORAL_TABLET | Freq: Two times a day (BID) | ORAL | Status: AC | PRN

## 2014-06-18 MED ORDER — AMOXICILLIN 875 MG PO TABS: 875.0000 mg | ORAL_TABLET | Freq: Two times a day (BID) | ORAL | Status: AC

## 2014-06-18 NOTE — Progress Notes (Signed)
Pt discharged to [facility] today per Dr. Rito Ehrlich. Pt's IV site and foley catheter removed per protocol. Sites WDL. Pt's VSS. Report called to nurse at [facility]. Report received and verbalized understanding. Pt left floor via stretcher accompanied by EMS transporters in stable condition.

## 2014-06-18 NOTE — Discharge Summary (Signed)
Discharge Summary  Jon [name] ZOX:[id number] DOB: [birth year]  PCP: Milana Obey, MD  Admit date: 06/14/2014 Discharge date: 06/18/2014  Time spent: 25 minutes  Recommendations for Outpatient Follow-up:  1. New medication: Augmentin 875 mg by mouth twice a day 7 days 2. New medication: Oxy IR 2.5 mg every 6 hours when necessary for pain 3.  Medication change: Xanax, dose decreased, to 0.5 mg by mouth twice a day 4. Patient will discontinue the following medications: Coumadin, Percocet, Norco, Lasix, Cardizem  Discharge Diagnoses:  Active Hospital Problems   Diagnosis Date Noted  . Acute kidney injury 06/15/2014  . Sepsis 06/15/2014  . UTI (lower urinary tract infection) 06/15/2014  . Bilateral hydronephrosis 06/15/2014  . Bladder calculi 06/15/2014  . Rhabdomyolysis 06/15/2014  . Hyponatremia 06/15/2014  . Multiple rib fractures 06/15/2014  . S/P placement of cardiac pacemaker 06/15/2014  . Dementia 06/15/2014  . Dehydration 06/09/2014  . Atrial fibrillation 09/07/2012  . Long term current use of anticoagulant therapy 09/07/2012    Resolved Hospital Problems   Diagnosis Date Noted Date Resolved  No resolved problems to display.    Discharge Condition: Improved, going to skilled nursing facility  Diet recommendation: Heart healthy  Filed Weights   06/15/14 1100 06/16/14 0500  Weight: 86 kg (189 lb 9.5 oz) 86 kg (189 lb 9.5 oz)    History of present illness:  Patient is an 81 year old male with generalized weakness and very limited movement found in his recliner chair on 12/29 in his own feces and urine with multiple to curious ulcers. CT scan noted obstructive uropathy and full catheter placed. Patient also noted to have UTI. Admitted to hospitalist service  Hospital Course:  Principal Problem:   Acute kidney injury: Resolved. Nonoliguric. Most likely from obstructive uropathy and noted multiple bladder calculi. Active Problems:   Atrial fibrillation:  Status post pacemaker. Coumadin stopped secondary to multiple falls with trauma. On sotalol. Cardizem stopped due to bradycardia and hypotension.    Dehydration: Secondary UTI., Resolved.  Bradycardia and hypotension: Patient's pressures have been soft even with IV fluids. He was on a large dose of narcotics and benzodiazepines at home. These have been weaned down. Cardizem and Lasix have been stopped.    Sepsis from enterococcal UTI: Sensitivities pending. Patient initially on IV Rocephin and once current enterococcus, changed over to amoxicillin.    UTI (lower urinary tract infection)   Bilateral hydronephrosis   Bladder calculi: Noted as above. No signs of obstruction now.   Rhabdomyolysis: Improved with IV fluids, renal function normal.   Hyponatremia: Secondary to dehydration, resolved with IV fluids   Multiple rib fractures: Traumatic fall prior to admission on 12/24    S/P placement of cardiac pacemaker   Dementia: Chronic, essential tremor. Patient needs skilled nursing   Procedures:  None  Consultations:  None  Discharge Exam: BP 107/56 mmHg  Pulse 56  Temp(Src) 97.9 F (36.6 C) (Oral)  Resp 20  Ht  (1.778 m)  Wt 86 kg (189 lb 9.5 oz)  BMI 27.20 kg/m2  SpO2 98%  General: Alert and oriented 2, no acute distress Cardiovascular: Regular rate and rhythm, S1-S2, borderline bradycardia Respiratory: Clear to auscultation bilaterally  Discharge Instructions You were cared for by a hospitalist during your hospital stay. If you have any questions about your discharge medications or the care you received while you were in the hospital after you are discharged, you can call the unit and asked to speak with the hospitalist on call if the  hospitalist that took care of you is not available. Once you are discharged, your primary care physician will handle any further medical issues. Please note that NO REFILLS for any discharge medications will be authorized once you are  discharged, as it is imperative that you return to your primary care physician (or establish a relationship with a primary care physician if you do not have one) for your aftercare needs so that they can reassess your need for medications and monitor your lab values.  Discharge Instructions    Diet - low sodium heart healthy    Complete by:  As directed      Increase activity slowly    Complete by:  As directed             Medication List    STOP taking these medications        diltiazem 120 MG 24 hr capsule  Commonly known as:  CARDIZEM CD     furosemide 20 MG tablet  Commonly known as:  LASIX     furosemide 40 MG tablet  Commonly known as:  LASIX     HYDROcodone-acetaminophen 10-325 MG per tablet  Commonly known as:  NORCO     oxyCODONE-acetaminophen 10-325 MG per tablet  Commonly known as:  PERCOCET     warfarin 2.5 MG tablet  Commonly known as:  COUMADIN      TAKE these medications        ALPRAZolam 1 MG tablet  Commonly known as:  XANAX  Take 0.5 tablets (0.5 mg total) by mouth 2 (two) times daily as needed for anxiety.     amoxicillin 875 MG tablet  Commonly known as:  AMOXIL  Take 1 tablet (875 mg total) by mouth 2 (two) times daily.     fexofenadine 180 MG tablet  Commonly known as:  ALLEGRA  Take 180 mg by mouth daily.     ICAPS AREDS FORMULA PO  Take 1 capsule by mouth daily.     omeprazole 20 MG capsule  Commonly known as:  PRILOSEC  Take 20 mg by mouth daily.     oxyCODONE 5 MG immediate release tablet  Commonly known as:  Oxy IR/ROXICODONE  Take 0.5 tablets (2.5 mg total) by mouth every 6 (six) hours as needed for severe pain.     potassium chloride 10 MEQ tablet  Commonly known as:  K-DUR,KLOR-CON  Take 1 tablet (10 mEq total) by mouth daily as needed (takes with lasix).     sotalol 160 MG tablet  Commonly known as:  BETAPACE  TAKE 1 TABLET BY MOUTH TWICE DAILY.     SYSTANE OP  Place 1 drop into both eyes daily as needed (for dry  eyes).     vitamin B-12 1000 MCG tablet  Commonly known as:  CYANOCOBALAMIN  Take 1,000 mcg by mouth daily.       Allergies  Allergen Reactions  . Iohexol      Desc: PT STATES THROAT/FACE SWELLING W/ IVP DYE IN 1990. PT HAS NOT HAD ANY EXAMS DONE W/DYE SINCE AND HAS NEVER BEEN PRE MEDICATED. PER PT, Onset Date: [id number]   . Dilantin [Phenytoin Sodium Extended] Rash  . Tegretol [Carbamazepine] Rash      The results of significant diagnostics from this hospitalization (including imaging, microbiology, ancillary and laboratory) are listed below for reference.    Significant Diagnostic Studies: Ct Abdomen Pelvis Wo Contrast  06/15/2014   CLINICAL DATA:  Acute abdominal pain with diarrhea  EXAM: CT  ABDOMEN AND PELVIS WITHOUT CONTRAST  TECHNIQUE: Multidetector CT imaging of the abdomen and pelvis was performed following the standard protocol without IV contrast.  COMPARISON:  None.  FINDINGS: BODY WALL: Unremarkable.  LOWER CHEST: Right ventricular pacer lead.  Trace right pleural effusion with mild atelectasis. This is presumably reactive to a mildly displaced right 12 and 10 rib fractures.  ABDOMEN/PELVIS:  Liver: No focal abnormality.  Biliary: Cholelithiasis.  No evidence of acute cholecystitis.  Pancreas: Unremarkable.  Spleen: Unremarkable.  Adrenals: Unremarkable.  Kidneys and ureters: Mild bilateral hydroureteronephrosis secondary to urinary bladder distention.  There is a large exophytic cyst from the right lower pole measuring 15 cm in maximal dimension. Mildly high-density 1 cm right upper pole and 2.5 cm left lower pole renal lesions likely reflect proteinaceous or hemorrhagic cysts.  Bladder: Small calculi layer within the distended bladder. Suspect chronic outlet obstruction.  Reproductive: Unremarkable.  Bowel: No obstruction. Normal appendix.  Retroperitoneum: There is a 2.5 cm nodule anterior to the duodenal bulb which is stable since 2007. The nodule is discrete from the liver,  but contiguous with the duodenum. Although this portion the duodenum is flattened, no evidence of gastric outlet obstruction.  Peritoneum: No ascites or pneumoperitoneum.  Vascular: No acute abnormality.  OSSEOUS:  Moderately displaced right tenth and nondisplaced right twelfth rib fractures.  Advanced lumbar degenerative disc and facet disease with multilevel canal and foraminal stenosis.  IMPRESSION: 1. Bilateral hydronephrosis from urinary retention. Retention is likely chronic as there are multiple bladder calculi. 2. Displaced right tenth and twelfth rib fractures with small pleural effusion and atelectasis. 3. Cholelithiasis. 4. Bilateral renal cysts. There are high density renal lesions bilaterally which are likely hemorrhagic/proteinaceous cysts. If renal ultrasound performed to follow-up the hydronephrosis, recommend attention to these lesions. 5. 2.5 cm mass in the right upper quadrant, contiguous with the duodenum, is stable since 2007 and benign.   Electronically Signed   By: Tiburcio Pea M.D.   On: 06/15/2014 05:03   Dg Lumbar Spine Complete  06/09/2014   CLINICAL DATA:  Fall out of chair with back pain, initial encounter  EXAM: LUMBAR SPINE - COMPLETE 4+ VIEW  COMPARISON:  None.  FINDINGS: Multilevel degenerative changes are seen. No compression deformities are noted. Diffuse aortic calcifications are seen. No anterolisthesis is seen. The large and small bowel filled with gas although no true obstructive changes are seen. Fecal material is noted within the colon.  IMPRESSION: Multilevel degenerative change without acute abnormality.   Electronically Signed   By: Alcide Clever M.D.   On: 06/09/2014 14:18   Dg Pelvis 1-2 Views  06/09/2014   CLINICAL DATA:  Larey Seat out of chair today with pelvic pain, initial encounter  EXAM: PELVIS - 1-2 VIEW  COMPARISON:  None.  FINDINGS: The pelvic ring is intact. Mild degenerative changes of the sacroiliac joints and hip joints are noted. No acute fracture or  dislocation is seen. No gross soft tissue abnormality is noted.  IMPRESSION: No acute abnormality noted.   Electronically Signed   By: Alcide Clever M.D.   On: 06/09/2014 14:19   Ct Head Wo Contrast  06/09/2014   CLINICAL DATA:  Weakness, slipped out of his chair today  EXAM: CT HEAD WITHOUT CONTRAST  TECHNIQUE: Contiguous axial images were obtained from the base of the skull through the vertex without intravenous contrast.  COMPARISON:  05/07/2008  FINDINGS: Again noted status post right frontal craniotomy.  There is mucosal thickening with complete opacification of the right maxillary  sinus. Almost complete opacification of left maxillary sinus. The mastoid air cells are unremarkable.  No intracranial hemorrhage, mass effect or midline shift. Mild cerebral atrophy. Ventricular size is stable from prior exam. Stable periventricular and patchy subcortical chronic white matter disease. No acute cortical infarction. No mass lesion is noted on this unenhanced scan.  IMPRESSION: No acute intracranial abnormality. Again noted status post right frontal craniotomy. Stable atrophy and chronic white matter disease. No definite acute cortical infarction.   Electronically Signed   By: Natasha Mead M.D.   On: 06/09/2014 14:25   Dg Chest Port 1 View  06/15/2014   CLINICAL DATA:  Hypoxia and weakness  EXAM: PORTABLE CHEST - 1 VIEW  COMPARISON:  06/09/2014  FINDINGS: There is a dual-chamber pacer from the left with leads in unchanged position.  Unchanged mild cardiomegaly and aortic tortuosity. The lungs are chronically hyperinflated. There is no edema, consolidation, effusion, or pneumothorax.  IMPRESSION: Stable exam.  No evidence of acute cardiopulmonary disease.   Electronically Signed   By: Tiburcio Pea M.D.   On: 06/15/2014 03:01   Dg Chest Port 1 View  06/09/2014   CLINICAL DATA:  81 year old male with a history of fall. Incontinence.  EXAM: PORTABLE CHEST - 1 VIEW  COMPARISON:  08/13/2012  FINDINGS:  Cardiomediastinal silhouette unchanged in size and contour. Re- demonstration of atherosclerotic calcifications of aortic arch, as well as tortuosity of the descending thoracic aorta.  Compared to the prior, there has been change in position of cardiac pacing device on the left chest wall. Two cardiac leads in place.  No visualized pneumothorax or pleural effusion. No confluent airspace disease. Lung volumes are low, with accentuation of the interstitial markings.  No displaced fracture.  Osteopenia.  IMPRESSION: No radiographic evidence of acute cardiopulmonary disease.  Atherosclerosis.  In the interval since 08/13/2012 there is either a new cardiac pacing device generator, or the generator has turned 180 degrees. Correlate with a history of prior recent surgery. Two cardiac leads remain in position, unchanged.  Signed,  Yvone Neu. Loreta Ave, DO  [facility]  [facility]   Electronically Signed   By: Gilmer Mor D.O.   On: 06/09/2014 13:51   Dg Knee Right Port  06/10/2014   CLINICAL DATA:  Knee pain  EXAM: PORTABLE RIGHT KNEE - 1-2 VIEW  COMPARISON:  None.  FINDINGS: Moderate joint effusion. Moderate tricompartment osteoarthritic change. Lucency through the superior aspect of the patella has a chronic appearance. Osteopenia.  IMPRESSION: Degenerative change.  Joint effusion.  Chronic changes.   Electronically Signed   By: Maryclare Bean M.D.   On: 06/10/2014 20:33    Microbiology: Recent Results (from the past 240 hour(s))  Urine culture     Status: None (Preliminary result)   Collection Time: 06/15/14  2:40 AM  Result Value Ref Range Status   Specimen Description URINE, CATHETERIZED  Final   Special Requests NONE  Final   Colony Count   Final    >=100,000 COLONIES/ML Performed at [facility]    Culture   Final    ENTEROCOCCUS SPECIES Performed at [facility]    Report Status PENDING  Incomplete  Clostridium Difficile by PCR      Status: None   Collection Time: 06/15/14  4:06 AM  Result Value Ref Range Status   C difficile by pcr NEGATIVE NEGATIVE Final  MRSA PCR Screening     Status: None   Collection Time: 06/15/14  2:00 PM  Result Value Ref Range Status   MRSA by PCR NEGATIVE NEGATIVE Final    Comment:        The GeneXpert MRSA Assay (FDA approved for NASAL specimens only), is one component of a comprehensive MRSA colonization surveillance program. It is not intended to diagnose MRSA infection nor to guide or monitor treatment for MRSA infections.      Labs: Basic Metabolic Panel:  Recent Labs Lab 06/15/14 0045 06/16/14 0430 06/18/14 1025  NA 125* 132* 130*  K 4.4 3.6 4.0  CL 94* 106 102  CO2 22 20 25   GLUCOSE 109* 118* 120*  BUN 53* 35* 16  CREATININE 2.03* 1.01 0.98  CALCIUM 8.3* 7.3* 7.5*  MG  --  1.7  --    Liver Function Tests:  Recent Labs Lab 06/15/14 0045  AST 58*  ALT 40  ALKPHOS 84  BILITOT 1.3*  PROT 6.2  ALBUMIN 3.1*    Recent Labs Lab 06/15/14 0045  LIPASE 49   No results for input(s): AMMONIA in the last 168 hours. CBC:  Recent Labs Lab 06/15/14 0045 06/16/14 0430 06/17/14 0634 06/18/14 1025  WBC 19.1* 9.8 9.6 10.9*  NEUTROABS 16.3*  --   --   --   HGB 14.3 10.3* 10.1* 10.8*  HCT 41.6 30.8* 30.5* 32.1*  MCV 84.9 86.3 86.6 87.2  PLT 356 207 200 227   Cardiac Enzymes:  Recent Labs Lab 06/15/14 0045 06/15/14 0545  CKTOTAL  --  938*  TROPONINI <0.03  --    BNP: BNP (last 3 results) No results for input(s): PROBNP in the last 8760 hours. CBG: No results for input(s): GLUCAP in the last 168 hours.     Signed:  Hollice Espy  [facility] 06/18/2014, 1:26 PM

## 2014-06-19 LAB — URINE CULTURE: Colony Count: >=100000

## 2014-06-19 LAB — IRON AND TIBC
Iron: 27 ug/dL — ABNORMAL LOW (ref 42–165)
Saturation Ratios: 17 % — ABNORMAL LOW (ref 20–55)
TIBC: 157 ug/dL — ABNORMAL LOW (ref 215–435)
UIBC: 130 ug/dL (ref 125–400)

## 2014-06-19 LAB — FERRITIN: FERRITIN: 280 ng/mL (ref 22–322)

## 2014-06-19 LAB — VITAMIN B12: Vitamin B-12: 1360 pg/mL — ABNORMAL HIGH (ref 211–911)

## 2014-06-19 LAB — FOLATE: FOLATE: 6.8 ng/mL

## 2014-06-20 ENCOUNTER — Encounter: Payer: Self-pay | Admitting: Cardiology

## 2014-06-20 NOTE — Progress Notes (Signed)
UR chart review completed.  

## 2014-06-30 ENCOUNTER — Encounter: Payer: Self-pay | Admitting: Cardiovascular Disease

## 2014-07-05 ENCOUNTER — Ambulatory Visit (HOSPITAL_COMMUNITY)
Admission: RE | Admit: 2014-07-05 | Discharge: 2014-07-05 | Disposition: A | Discharge status: Home / Self Care | Payer: Medicare Other | Source: Ambulatory Visit | Attending: Internal Medicine | Admitting: Internal Medicine

## 2014-07-05 DIAGNOSIS — L89154 Pressure ulcer of sacral region, stage 4: Secondary | ICD-10-CM | POA: Diagnosis not present

## 2014-07-05 DIAGNOSIS — D72829 Elevated white blood cell count, unspecified: Secondary | ICD-10-CM

## 2014-07-05 MED ORDER — SODIUM CHLORIDE 0.9 % IJ SOLN: 10.0000 mL | Freq: Two times a day (BID) | INTRAMUSCULAR | Status: DC

## 2014-07-05 MED ORDER — SODIUM CHLORIDE 0.9 % IJ SOLN: 10.0000 mL | INTRAMUSCULAR | Status: DC | PRN

## 2014-07-05 NOTE — Discharge Instructions (Signed)
Peripherally Inserted Central Catheter/Midline Placement  The IV Nurse has discussed with the patient and/or persons authorized to consent for the patient, the purpose of this procedure and the potential benefits and risks involved with this procedure.  The benefits include less needle sticks, lab draws from the catheter and patient may be discharged home with the catheter.  Risks include, but not limited to, infection, bleeding, blood clot (thrombus formation), and puncture of an artery; nerve damage and irregular heat beat.  Alternatives to this procedure were also discussed.  PICC/Midline Placement Documentation  PICC / Midline Single Lumen 07/05/14 PICC Right Basilic 40 cm 0 cm (Active)  Indication for Insertion or Continuance of Line Home intravenous therapies (PICC only) 07/05/2014  2:22 PM  Exposed Catheter (cm) 0 cm 07/05/2014  2:22 PM  Site Assessment Clean;Dry;Intact 07/05/2014  2:22 PM  Line Status Flushed;Saline locked;Blood return noted 07/05/2014  2:22 PM  Dressing Type Transparent;Securing device 07/05/2014  2:22 PM  Dressing Status Clean;Dry;Intact;Antimicrobial disc in place 07/05/2014  2:22 PM  Line Care Connections checked and tightened 07/05/2014  2:22 PM  Dressing Intervention New dressing 07/05/2014  2:22 PM  Dressing Change Due 07/12/14 07/05/2014  2:22 PM    PICC Insertion, Care After Refer to this sheet in the next few weeks. These instructions provide you with information on caring for yourself after your procedure. Your health care provider may also give you more specific instructions. Your treatment has been planned according to current medical practices, but problems sometimes occur. Call your health care provider if you have any problems or questions after your procedure. WHAT TO EXPECT AFTER THE PROCEDURE After your procedure, it is typical to have the following:  Mild discomfort at the insertion site. This should not last more than a day. HOME CARE INSTRUCTIONS  Rest  at home for the remainder of the day after the procedure.  You may bend your arm and move it freely. If your PICC is near or at the bend of your elbow, avoid activity with repeated motion at the elbow.  Avoid lifting heavy objects as instructed by your health care provider.  Avoid using a crutch with the arm on the same side as your PICC. You may need to use a walker. Bandage Care  Keep your PICC bandage (dressing) clean and dry to prevent infection.  Ask your health care provider when you may shower. To keep the dressing dry, cover the PICC with plastic wrap and tape before showering. If the dressing does become wet, replace it right after the shower.  Do not soak in the bath, swim, or use hot tubs when you have a PICC.  Change the PICC dressing as instructed by your health care provider.  Change your PICC dressing if it becomes loose or wet. General PICC Care  Check the PICC insertion site daily for leakage, redness, swelling, or pain.  Flush the PICC as directed by your health care provider. Let your health care provider know right away if the PICC is difficult to flush or does not flush. Do not use force to flush the PICC.  Do not use a syringe that is less than 10 mL to flush the PICC.  Never pull or tug on the PICC.  Avoid blood pressure checks on the arm with the PICC.  Keep your PICC identification card with you at all times.  Do not take the PICC out yourself. Only a trained health care professional should remove the PICC. SEEK MEDICAL CARE IF:  You  have pain in your arm, ear, face, or teeth.  You have fever or chills.  You have drainage from the PICC insertion site.  You have redness or palpate a "cord" around the PICC insertion site.  You cannot flush the catheter. SEEK IMMEDIATE MEDICAL CARE IF:  You have swelling in the arm in which the PICC is inserted. Document Released: 03/24/2013 Document Revised: 06/08/2013 Document Reviewed: 03/24/2013 [facility]  Patient Information 2015 [place], [place]. This information is not intended to replace advice given to you by your health care provider. Make sure you discuss any questions you have with your health care provider.   If you have any questions concerning PICC line, please call [facility] at [phone] or [phone].

## 2014-07-05 NOTE — Progress Notes (Addendum)
Peripherally Inserted Central Catheter/Midline Placement  The IV Nurse has discussed with the patient and/or persons authorized to consent for the patient, the purpose of this procedure and the potential benefits and risks involved with this procedure.  The benefits include less needle sticks, lab draws from the catheter and patient may be discharged home with the catheter.  Risks include, but not limited to, infection, bleeding, blood clot (thrombus formation), and puncture of an artery; nerve damage and irregular heat beat.  Alternatives to this procedure were also discussed.  PICC/Midline Placement Documentation  PICC / Midline Single Lumen 07/05/14 PICC Right Basilic 40 cm 0 cm (Active)  Indication for Insertion or Continuance of Line Home intravenous therapies (PICC only) 07/05/2014  2:22 PM  Exposed Catheter (cm) 0 cm 07/05/2014  2:22 PM  Site Assessment Clean;Dry;Intact 07/05/2014  2:22 PM  Line Status Flushed;Saline locked;Blood return noted 07/05/2014  2:22 PM  Dressing Type Transparent;Securing device 07/05/2014  2:22 PM  Dressing Status Clean;Dry;Intact;Antimicrobial disc in place 07/05/2014  2:22 PM  Line Care Connections checked and tightened 07/05/2014  2:22 PM  Dressing Intervention New dressing 07/05/2014  2:22 PM  Dressing Change Due 07/12/14 07/05/2014  2:22 PM    Gave report to Early Charsarolyn Harrison, RN at [facility]. EMS transporting pt back to [facility].    Daissy Yerian Renae 07/05/2014, 2:46 PM

## 2014-07-05 NOTE — Progress Notes (Signed)
   07/05/14 1507  Medical Necessity for Transport Certificate --- IF THIS TRANSPORT IS ROUND TRIP OR SCHEDULED AND REPEATED, A PHYSICIAN MUST COMPLETE THIS FORM  Transport to (Location) Other (Comment) [facility])  Reason for Transport Discharge  Name of Transporting Agency [facility]  Round Trip Transport? No  Q1 Are ALL the following "true" for this patient?              1. Unable to get up from bed without assistance  AND            2. Unable to ambulate AND        3. Unable to sit in a chair, including a wheelchair. Yes  Q2 Could the patient be transported safely by other means of transportation (I.E., wheelchair van)? No  Reason for transport - patient condition Requires continuous oxygen;Altered mental status  Q3 Reason based on Facility and/or Secondary school teacherervices Transport to nearest appropriate facility  Building surveyorlectronic Signature and Credentials (can be: Physician, RN, NP, PA, DP, CNS) Fransico HimMeagan Ferris Tally, RN

## 2014-07-06 ENCOUNTER — Inpatient Hospital Stay (HOSPITAL_COMMUNITY)
Admission: EM | Admit: 2014-07-06 | Discharge: 2014-07-12 | DRG: 570 | Disposition: A | Discharge status: Skilled Nursing Facility | Payer: Medicare Other | Attending: Internal Medicine | Admitting: Internal Medicine

## 2014-07-06 ENCOUNTER — Encounter (HOSPITAL_COMMUNITY): Payer: Self-pay

## 2014-07-06 DIAGNOSIS — L8990 Pressure ulcer of unspecified site, unspecified stage: Secondary | ICD-10-CM | POA: Diagnosis present

## 2014-07-06 DIAGNOSIS — L98429 Non-pressure chronic ulcer of back with unspecified severity: Secondary | ICD-10-CM | POA: Diagnosis present

## 2014-07-06 DIAGNOSIS — N1330 Unspecified hydronephrosis: Secondary | ICD-10-CM | POA: Diagnosis present

## 2014-07-06 DIAGNOSIS — E876 Hypokalemia: Secondary | ICD-10-CM | POA: Diagnosis not present

## 2014-07-06 DIAGNOSIS — I4891 Unspecified atrial fibrillation: Secondary | ICD-10-CM | POA: Diagnosis present

## 2014-07-06 DIAGNOSIS — L89154 Pressure ulcer of sacral region, stage 4: Secondary | ICD-10-CM | POA: Diagnosis present

## 2014-07-06 DIAGNOSIS — Z820 Family history of epilepsy and other diseases of the nervous system: Secondary | ICD-10-CM | POA: Diagnosis not present

## 2014-07-06 DIAGNOSIS — H3530 Unspecified macular degeneration: Secondary | ICD-10-CM | POA: Diagnosis present

## 2014-07-06 DIAGNOSIS — Z8249 Family history of ischemic heart disease and other diseases of the circulatory system: Secondary | ICD-10-CM

## 2014-07-06 DIAGNOSIS — F0390 Unspecified dementia without behavioral disturbance: Secondary | ICD-10-CM | POA: Diagnosis present

## 2014-07-06 DIAGNOSIS — G63 Polyneuropathy in diseases classified elsewhere: Secondary | ICD-10-CM | POA: Diagnosis present

## 2014-07-06 DIAGNOSIS — E861 Hypovolemia: Secondary | ICD-10-CM | POA: Diagnosis present

## 2014-07-06 DIAGNOSIS — Z800 Family history of malignant neoplasm of digestive organs: Secondary | ICD-10-CM

## 2014-07-06 DIAGNOSIS — Z87891 Personal history of nicotine dependence: Secondary | ICD-10-CM | POA: Diagnosis not present

## 2014-07-06 DIAGNOSIS — G4733 Obstructive sleep apnea (adult) (pediatric): Secondary | ICD-10-CM | POA: Diagnosis present

## 2014-07-06 DIAGNOSIS — G629 Polyneuropathy, unspecified: Secondary | ICD-10-CM | POA: Diagnosis present

## 2014-07-06 DIAGNOSIS — I10 Essential (primary) hypertension: Secondary | ICD-10-CM | POA: Diagnosis present

## 2014-07-06 DIAGNOSIS — K6289 Other specified diseases of anus and rectum: Secondary | ICD-10-CM | POA: Insufficient documentation

## 2014-07-06 DIAGNOSIS — E860 Dehydration: Secondary | ICD-10-CM | POA: Diagnosis present

## 2014-07-06 DIAGNOSIS — E871 Hypo-osmolality and hyponatremia: Secondary | ICD-10-CM | POA: Diagnosis present

## 2014-07-06 DIAGNOSIS — R609 Edema, unspecified: Secondary | ICD-10-CM

## 2014-07-06 DIAGNOSIS — D638 Anemia in other chronic diseases classified elsewhere: Secondary | ICD-10-CM | POA: Diagnosis present

## 2014-07-06 DIAGNOSIS — Z950 Presence of cardiac pacemaker: Secondary | ICD-10-CM

## 2014-07-06 DIAGNOSIS — E440 Moderate protein-calorie malnutrition: Secondary | ICD-10-CM | POA: Insufficient documentation

## 2014-07-06 LAB — CBC WITH DIFFERENTIAL/PLATELET
BASOS ABS: 0 10*3/uL (ref 0.0–0.1)
Basophils Relative: 0 % (ref 0–1)
EOS ABS: 0.1 10*3/uL (ref 0.0–0.7)
EOS PCT: 0 % (ref 0–5)
HCT: 33.1 % — ABNORMAL LOW (ref 39.0–52.0)
Hemoglobin: 11.2 g/dL — ABNORMAL LOW (ref 13.0–17.0)
Lymphocytes Relative: 5 % — ABNORMAL LOW (ref 12–46)
Lymphs Abs: 0.9 10*3/uL (ref 0.7–4.0)
MCH: 28.1 pg (ref 26.0–34.0)
MCHC: 33.8 g/dL (ref 30.0–36.0)
MCV: 83 fL (ref 78.0–100.0)
Monocytes Absolute: 1.2 10*3/uL — ABNORMAL HIGH (ref 0.1–1.0)
Monocytes Relative: 7 % (ref 3–12)
NEUTROS PCT: 88 % — AB (ref 43–77)
Neutro Abs: 14.5 10*3/uL — ABNORMAL HIGH (ref 1.7–7.7)
PLATELETS: 273 10*3/uL (ref 150–400)
RBC: 3.99 MIL/uL — AB (ref 4.22–5.81)
RDW: 15 % (ref 11.5–15.5)
WBC: 16.6 10*3/uL — AB (ref 4.0–10.5)

## 2014-07-06 LAB — BASIC METABOLIC PANEL
ANION GAP: 7 (ref 5–15)
BUN: 11 mg/dL (ref 6–23)
CHLORIDE: 94 meq/L — AB (ref 96–112)
CO2: 28 mmol/L (ref 19–32)
Calcium: 7.8 mg/dL — ABNORMAL LOW (ref 8.4–10.5)
Creatinine, Ser: 0.55 mg/dL (ref 0.50–1.35)
GFR calc non Af Amer: >90 mL/min (ref >90)
Glucose, Bld: 129 mg/dL — ABNORMAL HIGH (ref 70–99)
POTASSIUM: 3.6 mmol/L (ref 3.5–5.1)
SODIUM: 129 mmol/L — AB (ref 135–145)

## 2014-07-06 MED ORDER — PIPERACILLIN-TAZOBACTAM 3.375 G IVPB 30 MIN
3.3750 g | Freq: Once | INTRAVENOUS | Status: AC
  Administered 2014-07-07: 3.375 g via INTRAVENOUS
  Filled 2014-07-06: qty 50

## 2014-07-06 MED ORDER — PANTOPRAZOLE SODIUM 40 MG PO TBEC
40.0000 mg | DELAYED_RELEASE_TABLET | Freq: Every day | ORAL | Status: DC
  Administered 2014-07-07 – 2014-07-12 (×6): 40 mg via ORAL
  Filled 2014-07-06 (×8): qty 1

## 2014-07-06 MED ORDER — VANCOMYCIN HCL 10 G IV SOLR
1500.0000 mg | Freq: Once | INTRAVENOUS | Status: AC
  Administered 2014-07-07: 1500 mg via INTRAVENOUS
  Filled 2014-07-06: qty 1500

## 2014-07-06 MED ORDER — SODIUM CHLORIDE 0.9 % IJ SOLN
3.0000 mL | Freq: Two times a day (BID) | INTRAMUSCULAR | Status: DC
  Administered 2014-07-07 – 2014-07-12 (×7): 3 mL via INTRAVENOUS

## 2014-07-06 MED ORDER — PIPERACILLIN-TAZOBACTAM 3.375 G IVPB
INTRAVENOUS | Status: AC
  Filled 2014-07-06: qty 50

## 2014-07-06 MED ORDER — HEPARIN SODIUM (PORCINE) 5000 UNIT/ML IJ SOLN
5000.0000 [IU] | Freq: Three times a day (TID) | INTRAMUSCULAR | Status: DC
  Administered 2014-07-07 – 2014-07-12 (×17): 5000 [IU] via SUBCUTANEOUS
  Filled 2014-07-06 (×17): qty 1

## 2014-07-06 MED ORDER — CHLORHEXIDINE GLUCONATE 0.12 % MT SOLN
15.0000 mL | Freq: Two times a day (BID) | OROMUCOSAL | Status: DC
  Administered 2014-07-07 – 2014-07-12 (×11): 15 mL via OROMUCOSAL
  Filled 2014-07-06 (×10): qty 15

## 2014-07-06 MED ORDER — SODIUM CHLORIDE 0.9 % IV SOLN
INTRAVENOUS | Status: AC
  Administered 2014-07-07 – 2014-07-08 (×3): via INTRAVENOUS

## 2014-07-06 MED ORDER — CETYLPYRIDINIUM CHLORIDE 0.05 % MT LIQD
7.0000 mL | Freq: Two times a day (BID) | OROMUCOSAL | Status: DC
  Administered 2014-07-07 – 2014-07-12 (×10): 7 mL via OROMUCOSAL

## 2014-07-06 MED ORDER — OXYCODONE-ACETAMINOPHEN 5-325 MG PO TABS
2.0000 | ORAL_TABLET | Freq: Once | ORAL | Status: AC
Start: 2014-07-06 — End: 2014-07-07
  Administered 2014-07-07: 2 via ORAL
  Filled 2014-07-06: qty 2

## 2014-07-06 MED ORDER — SODIUM CHLORIDE 0.9 % IV BOLUS (SEPSIS)
500.0000 mL | Freq: Once | INTRAVENOUS | Status: AC
  Administered 2014-07-06: 500 mL via INTRAVENOUS

## 2014-07-06 NOTE — Progress Notes (Signed)
Pharmacy Note:  Initial antibiotics for Vancomycin and Zosyn ordered  Estimated Creatinine Clearance: 76 mL/min (by C-G formula based on Cr of 0.55).   Allergies  Allergen Reactions  . Iohexol      Desc: PT STATES THROAT/FACE SWELLING W/ IVP DYE IN 1990. PT HAS NOT HAD ANY EXAMS DONE W/DYE SINCE AND HAS NEVER BEEN PRE MEDICATED. PER PT, Onset Date: [id number]   . Dilantin [Phenytoin Sodium Extended] Rash  . Tegretol [Carbamazepine] Rash    Filed Vitals:   07/06/14 2141  BP: 111/70  Pulse: 100  Temp: 98.3 F (36.8 C)  Resp: 18    Anti-infectives    Start     Dose/Rate Route Frequency Ordered Stop   07/06/14 2230  vancomycin (VANCOCIN) 1,500 mg in sodium chloride 0.9 % 500 mL IVPB     1,500 mg250 mL/hr over 120 Minutes Intravenous  Once 07/06/14 2227     07/06/14 2230  piperacillin-tazobactam (ZOSYN) IVPB 3.375 g     3.375 g100 mL/hr over 30 Minutes Intravenous  Once 07/06/14 2227        Plan: Initial doses of vancomycin 1500mg  and zosyn 3.375gm ordered F/U admission orders for further dosing if therapy continued.  Wayland DenisHall, Moxie Kalil A, [facility] 07/06/2014 10:27 PM

## 2014-07-06 NOTE — ED Provider Notes (Addendum)
CSN: [id number]     Arrival date & time 07/06/14  1801 History   First MD Initiated Contact with Patient 07/06/14 1850     Chief Complaint  Patient presents with  . Wound Infection     (Consider location/radiation/quality/duration/timing/severity/associated sxs/prior Treatment) HPI..... Level V caveat for dementia.   Patient has profound decubitus ulcer in his sacral area.  He has been treated at the local nursing home. Wound has been getting worse.  Apparently IV antibiotics were started. Patient was transferred from the nursing facility to the emergency department for evaluation of decubitus wounds   Past Medical History  Diagnosis Date  . Heart disease   . Atrial fibrillation     WITH PACEMAKER  . OSA (obstructive sleep apnea)     does not use CPAP  . Memory loss   . Bleeding ulcer   . B12 deficiency   . Hypertension   . Abnormality of gait 05/03/2013  . Essential and other specified forms of tremor 05/03/2013  . Polyneuropathy in other diseases classified elsewhere 05/03/2013  . Peripheral edema   . Varicose veins     Bilateral  . Macular degeneration of left eye   . Bilateral hydronephrosis 06/15/2014  . Bladder calculi 06/15/2014  . Multiple rib fractures 06/15/2014   Past Surgical History  Procedure Laterality Date  . Pacemaker insertion  2006  . Meningioma removal  1992    POST RT FRONTAL   . Cataract extraction Bilateral 2013  . Permanent pacemaker generator change N/A 08/13/2012    Procedure: PERMANENT PACEMAKER GENERATOR CHANGE;  Surgeon: Thurmon Fair, MD;  Location: [facility] CATH LAB;  Service: Cardiovascular;  Laterality: N/A;   Family History  Problem Relation Age of Onset  . Aortic aneurysm Mother   . Parkinsonism Mother   . Heart disease Father   . Cancer Sister     esaphageal cancer   History  Substance Use Topics  . Smoking status: Former Smoker    Types: Cigarettes  . Smokeless tobacco: Never Used     Comment: QUIT 35 YRS AGO  . Alcohol Use: No     Review of Systems  Unable to perform ROS: Dementia      Allergies  Iohexol; Dilantin; and Tegretol  Home Medications   Prior to Admission medications   Medication Sig Start Date End Date Taking? Authorizing Provider  ALPRAZolam Prudy Feeler) 1 MG tablet Take 0.5 tablets (0.5 mg total) by mouth 2 (two) times daily as needed for anxiety. 06/18/14  Yes Hollice Espy, MD  Amino Acids-Protein Hydrolys (FEEDING SUPPLEMENT, PRO-STAT SUGAR FREE 64,) LIQD Take 30 mLs by mouth 3 (three) times daily with meals.   Yes Historical Provider, MD  Cefepime HCl 2 GM/100ML SOLN Inject 1 g into the vein every 12 (twelve) hours. 7 day course starting on 07/05/2014   Yes Historical Provider, MD  diltiazem (CARDIZEM) 60 MG tablet Take 60 mg by mouth daily.   Yes Historical Provider, MD  Multiple Vitamin (MULTIVITAMIN WITH MINERALS) TABS tablet Take 1 tablet by mouth daily.   Yes Historical Provider, MD  omeprazole (PRILOSEC) 20 MG capsule Take 20 mg by mouth daily.   Yes Historical Provider, MD  oxyCODONE (OXY IR/ROXICODONE) 5 MG immediate release tablet Take 0.5 tablets (2.5 mg total) by mouth every 6 (six) hours as needed for severe pain. 06/18/14  Yes Hollice Espy, MD  potassium chloride (K-DUR,KLOR-CON) 10 MEQ tablet Take 1 tablet (10 mEq total) by mouth daily as needed (takes with lasix). Patient  taking differently: Take 10 mEq by mouth daily.  05/09/14  Yes Mihai Croitoru, MD  VANCOMYCIN HCL PO Take 1,000 mg by mouth daily. 7 day course starting on 07/04/2014   Yes Historical Provider, MD  Zinc Oxide 10 % OINT Apply 1 application topically 2 (two) times daily. APPLIED TO SACRUM EVERY DAY   Yes Historical Provider, MD  fluconazole (DIFLUCAN) 150 MG tablet Take 150 mg by mouth daily. 3 day course from 07/02/2014 to 07/04/2014    Historical Provider, MD  sotalol (BETAPACE) 160 MG tablet TAKE 1 TABLET BY MOUTH TWICE DAILY. Patient not taking: Reported on 07/06/2014    Mihai Croitoru, MD   BP 147/93 mmHg   Pulse 115  Temp(Src) 97.3 F (36.3 C) (Oral)  Resp 18  Ht 5\' 10"  (1.778 m)  Wt 180 lb (81.647 kg)  BMI 25.83 kg/m2  SpO2 97% Physical Exam  Constitutional:  Demented  HENT:  Head: Normocephalic and atraumatic.  Eyes: Conjunctivae and EOM are normal. Pupils are equal, round, and reactive to light.  Neck: Normal range of motion. Neck supple.  Cardiovascular: Normal rate and regular rhythm.   Pulmonary/Chest: Effort normal and breath sounds normal.  Abdominal: Soft. Bowel sounds are normal.  Musculoskeletal:  Unable  Neurological:  Unable  Skin:  Deep sacral decubitus ulcer  Psychiatric:  Demented  Nursing note and vitals reviewed.   ED Course  Procedures (including critical care time) Labs Review Labs Reviewed  BASIC METABOLIC PANEL - Abnormal; Notable for the following:    Sodium 129 (*)    Chloride 94 (*)    Glucose, Bld 129 (*)    Calcium 7.8 (*)    All other components within normal limits  CBC WITH DIFFERENTIAL - Abnormal; Notable for the following:    WBC 16.6 (*)    RBC 3.99 (*)    Hemoglobin 11.2 (*)    HCT 33.1 (*)    Neutrophils Relative % 88 (*)    Neutro Abs 14.5 (*)    Lymphocytes Relative 5 (*)    Monocytes Absolute 1.2 (*)    All other components within normal limits    Imaging Review Dg Chest Port 1 View  07/05/2014   CLINICAL DATA:  PICC placement  EXAM: PORTABLE CHEST - 1 VIEW  COMPARISON:  06/15/2014  FINDINGS: Right arm PICC has been placed with tip in the SVC at the cavoatrial junction in good position.  Heart size upper normal. Dual lead pacemaker unchanged. Lungs remain clear.  IMPRESSION: PICC tip in the lower SVC in good position.   Electronically Signed   By: Marlan Palauharles  Clark M.D.   On: 07/05/2014 14:36     EKG Interpretation None      MDM   Final diagnoses:  Decubitus ulcer    Admit to general medicine. Dr. Franky MachoMark Jenkins to consult.    Donnetta HutchingBrian Melinda Pottinger, MD 07/06/14 [id number]  Donnetta HutchingBrian Tateanna Bach, MD 07/06/14 2127

## 2014-07-06 NOTE — ED Notes (Signed)
Pt sent here from [facility] for admission to Dr. Lovell SheehanJenkins service. Per staff at [facility] pt is being admitted for perianal wound

## 2014-07-06 NOTE — H&P (Signed)
Hospitalist Admission History and Physical  Patient name: Jon [name] Medical record number: [id number] Date of birth: 1931/12/09 Age: 81 y.o. Gender: male  Primary Care Provider: Milana Obey, MD  Chief Complaint: sacral wound infection   History of Present Illness:This is a 81 y.o. year old male with significant past medical history of dementia, afib not on anticoagulation 2/2 recurrent falls, cardiac pacemaker, recent admission for sepsis 2/2 enterococcal UTI  presenting with wound infection. Level V caveat as pt is fairly confused. Son at bedside who is somewhat unfamiliar  W/ pt since he has been in SNF. Pt noted to have been recently admitted 06/14/14-06/18/14 for sepsis 2/2 enterococcal UTI, AKI. Pt has been resident of local SNF since hospital discharge. Per report, pt has had worsening sacral ulcer while at nursing home. A PICC line was noted to have been placed yesterday w/ vanc and cefepime. Per report (which is relatively unclear at this point) wound care vs. SNF MD discussed case w/ Surgery Lovell Sheehan) who asked for pt to be admitted to hospitalist service for further eval. Possible diverting colostomy.  Presented to ER afebrile, hemodynamically stable. WBC 16.6, hgb 11.2, Cr 0.55. Na 129. CXR w/ stable PICC placement.   Assessment and Plan: Jon [name] is a 81 y.o. year old male presenting with sacral wound infection   Active Problems:   Perianal infection   Decubitus ulcer   Sacral ulcer   1- Sacral wound infection  -IV vanc and zosyn  -pan and wound culture -f/u surgery recommendations -trend leukocytosis    2- Afib  -On cardizem outpt  -LLN BPs on presentation  -hold overnight -follow BP and HR  3-Hyponatremia -hypovolemic -mildly dry on exam -gently hydrate  -follow   FEN/GI: NPO PMN  Prophylaxis: sub q heparin  Disposition: pending further evaluation  Code Status:Full Code    Patient Active Problem List   Diagnosis Date Noted  .  Perianal infection 07/06/2014  . Decubitus ulcer 07/06/2014  . Sacral ulcer 07/06/2014  . Anemia 06/16/2014  . Sepsis 06/15/2014  . UTI (lower urinary tract infection) 06/15/2014  . Acute kidney injury 06/15/2014  . Bilateral hydronephrosis 06/15/2014  . Bladder calculi 06/15/2014  . Rhabdomyolysis 06/15/2014  . Hyponatremia 06/15/2014  . Multiple rib fractures 06/15/2014  . Hypotension 06/15/2014  . S/P placement of cardiac pacemaker 06/15/2014  . Dementia 06/15/2014  . AKI (acute kidney injury) 06/15/2014  . Knee pain   . Dehydration 06/09/2014  . Hypertension 06/09/2014  . Abnormality of gait 05/03/2013  . Essential and other specified forms of tremor 05/03/2013  . Polyneuropathy in other diseases classified elsewhere 05/03/2013  . Atrial fibrillation 09/07/2012  . Long term current use of anticoagulant therapy 09/07/2012   Past Medical History: Past Medical History  Diagnosis Date  . Heart disease   . Atrial fibrillation     WITH PACEMAKER  . OSA (obstructive sleep apnea)     does not use CPAP  . Memory loss   . Bleeding ulcer   . B12 deficiency   . Hypertension   . Abnormality of gait 05/03/2013  . Essential and other specified forms of tremor 05/03/2013  . Polyneuropathy in other diseases classified elsewhere 05/03/2013  . Peripheral edema   . Varicose veins     Bilateral  . Macular degeneration of left eye   . Bilateral hydronephrosis 06/15/2014  . Bladder calculi 06/15/2014  . Multiple rib fractures 06/15/2014    Past Surgical History: Past Surgical History  Procedure Laterality Date  .  Pacemaker insertion  2006  . Meningioma removal  1992    POST RT FRONTAL   . Cataract extraction Bilateral 2013  . Permanent pacemaker generator change N/A 08/13/2012    Procedure: PERMANENT PACEMAKER GENERATOR CHANGE;  Surgeon: Thurmon Fair, MD;  Location: [facility] CATH LAB;  Service: Cardiovascular;  Laterality: N/A;    Social History: History   Social History  .  Marital Status: Married    Spouse Name: N/A    Number of Children: 2  . Years of Education: COLLEGE   Occupational History  . RETIRED    Social History Main Topics  . Smoking status: Former Smoker    Types: Cigarettes  . Smokeless tobacco: Never Used     Comment: QUIT 35 YRS AGO  . Alcohol Use: No  . Drug Use: No  . Sexual Activity: No   Other Topics Concern  . None   Social History Narrative    Family History: Family History  Problem Relation Age of Onset  . Aortic aneurysm Mother   . Parkinsonism Mother   . Heart disease Father   . Cancer Sister     esaphageal cancer    Allergies: Allergies  Allergen Reactions  . Iohexol      Desc: PT STATES THROAT/FACE SWELLING W/ IVP DYE IN 1990. PT HAS NOT HAD ANY EXAMS DONE W/DYE SINCE AND HAS NEVER BEEN PRE MEDICATED. PER PT, Onset Date: [id number]   . Dilantin [Phenytoin Sodium Extended] Rash  . Tegretol [Carbamazepine] Rash    Current Facility-Administered Medications  Medication Dose Route Frequency Provider Last Rate Last Dose  . 0.9 %  sodium chloride infusion   Intravenous Continuous Doree Albee, MD      . heparin injection 5,000 Units  5,000 Units Subcutaneous 3 times per day Doree Albee, MD      . pantoprazole (PROTONIX) EC tablet 40 mg  40 mg Oral Daily Doree Albee, MD      . sodium chloride 0.9 % injection 3 mL  3 mL Intravenous Q12H Doree Albee, MD       Current Outpatient Prescriptions  Medication Sig Dispense Refill  . ALPRAZolam (XANAX) 1 MG tablet Take 0.5 tablets (0.5 mg total) by mouth 2 (two) times daily as needed for anxiety. 30 tablet 0  . Amino Acids-Protein Hydrolys (FEEDING SUPPLEMENT, PRO-STAT SUGAR FREE 64,) LIQD Take 30 mLs by mouth 3 (three) times daily with meals.    . Cefepime HCl 2 GM/100ML SOLN Inject 1 g into the vein every 12 (twelve) hours. 7 day course starting on 07/05/2014    . diltiazem (CARDIZEM) 60 MG tablet Take 60 mg by mouth daily.    . Multiple Vitamin (MULTIVITAMIN WITH  MINERALS) TABS tablet Take 1 tablet by mouth daily.    Marland Kitchen omeprazole (PRILOSEC) 20 MG capsule Take 20 mg by mouth daily.    Marland Kitchen oxyCODONE (OXY IR/ROXICODONE) 5 MG immediate release tablet Take 0.5 tablets (2.5 mg total) by mouth every 6 (six) hours as needed for severe pain. 30 tablet 0  . potassium chloride (K-DUR,KLOR-CON) 10 MEQ tablet Take 1 tablet (10 mEq total) by mouth daily as needed (takes with lasix). (Patient taking differently: Take 10 mEq by mouth daily. ) 30 tablet 4  . VANCOMYCIN HCL PO Take 1,000 mg by mouth daily. 7 day course starting on 07/04/2014    . Zinc Oxide 10 % OINT Apply 1 application topically 2 (two) times daily. APPLIED TO SACRUM EVERY DAY    . fluconazole (DIFLUCAN) 150 MG  tablet Take 150 mg by mouth daily. 3 day course from 07/02/2014 to 07/04/2014    . sotalol (BETAPACE) 160 MG tablet TAKE 1 TABLET BY MOUTH TWICE DAILY. (Patient not taking: Reported on 07/06/2014) 60 tablet 11   Review Of Systems: 12 point ROS negative except as noted above in HPI.  Physical Exam: Filed Vitals:   07/06/14 2128  BP: 103/75  Pulse: 98  Temp: 97.7 F (36.5 C)  Resp: 20    General: cooperative and confused  HEENT: PERRLA and extra ocular movement intact Heart: S1, S2 normal, no murmur, rub or gallop, regular rate and rhythm Lungs: clear to auscultation, no wheezes or rales and unlabored breathing Abdomen: abdomen is soft without significant tenderness, masses, organomegaly or guarding GU: unstageable sacral ulcer w/ visible bony exposure  Extremities: extremities normal, atraumatic, no cyanosis or edema Skin:no rashes Neurology: normal without focal findings  Labs and Imaging: Lab Results  Component Value Date/Time   NA 129* 07/06/2014 07:31 PM   K 3.6 07/06/2014 07:31 PM   CL 94* 07/06/2014 07:31 PM   CO2 28 07/06/2014 07:31 PM   BUN 11 07/06/2014 07:31 PM   CREATININE 0.55 07/06/2014 07:31 PM   GLUCOSE 129* 07/06/2014 07:31 PM   Lab Results  Component Value Date    WBC 16.6* 07/06/2014   HGB 11.2* 07/06/2014   HCT 33.1* 07/06/2014   MCV 83.0 07/06/2014   PLT 273 07/06/2014   Urinalysis    Dg Chest Port 1 View  07/05/2014   CLINICAL DATA:  PICC placement  EXAM: PORTABLE CHEST - 1 VIEW  COMPARISON:  06/15/2014  FINDINGS: Right arm PICC has been placed with tip in the SVC at the cavoatrial junction in good position.  Heart size upper normal. Dual lead pacemaker unchanged. Lungs remain clear.  IMPRESSION: PICC tip in the lower SVC in good position.   Electronically Signed   By: Marlan Palauharles  Clark M.D.   On: 07/05/2014 14:36           Doree AlbeeSteven Benito Lemmerman MD  Pager: [phone]

## 2014-07-06 NOTE — ED Notes (Signed)
Pt has picc to right arm that was placed AP yesterday. Pt also has foley intact on arrival

## 2014-07-07 DIAGNOSIS — L89154 Pressure ulcer of sacral region, stage 4: Principal | ICD-10-CM

## 2014-07-07 DIAGNOSIS — I481 Persistent atrial fibrillation: Secondary | ICD-10-CM

## 2014-07-07 DIAGNOSIS — E871 Hypo-osmolality and hyponatremia: Secondary | ICD-10-CM

## 2014-07-07 LAB — COMPREHENSIVE METABOLIC PANEL
ALK PHOS: 80 U/L (ref 39–117)
ALT: 17 U/L (ref 0–53)
AST: 21 U/L (ref 0–37)
Albumin: 1.9 g/dL — ABNORMAL LOW (ref 3.5–5.2)
Anion gap: 6 (ref 5–15)
BUN: 11 mg/dL (ref 6–23)
CALCIUM: 7.4 mg/dL — AB (ref 8.4–10.5)
CHLORIDE: 94 meq/L — AB (ref 96–112)
CO2: 29 mmol/L (ref 19–32)
Creatinine, Ser: 0.55 mg/dL (ref 0.50–1.35)
GFR calc Af Amer: >90 mL/min (ref >90)
GLUCOSE: 123 mg/dL — AB (ref 70–99)
POTASSIUM: 3.6 mmol/L (ref 3.5–5.1)
Sodium: 129 mmol/L — ABNORMAL LOW (ref 135–145)
Total Bilirubin: 0.7 mg/dL (ref 0.3–1.2)
Total Protein: 4.6 g/dL — ABNORMAL LOW (ref 6.0–8.3)

## 2014-07-07 LAB — CBC WITH DIFFERENTIAL/PLATELET
BASOS ABS: 0 10*3/uL (ref 0.0–0.1)
Basophils Relative: 0 % (ref 0–1)
EOS ABS: 0 10*3/uL (ref 0.0–0.7)
EOS PCT: 0 % (ref 0–5)
HEMATOCRIT: 29.8 % — AB (ref 39.0–52.0)
Hemoglobin: 10.1 g/dL — ABNORMAL LOW (ref 13.0–17.0)
LYMPHS PCT: 4 % — AB (ref 12–46)
Lymphs Abs: 0.6 10*3/uL — ABNORMAL LOW (ref 0.7–4.0)
MCH: 28.4 pg (ref 26.0–34.0)
MCHC: 33.9 g/dL (ref 30.0–36.0)
MCV: 83.7 fL (ref 78.0–100.0)
MONO ABS: 0.6 10*3/uL (ref 0.1–1.0)
MONOS PCT: 5 % (ref 3–12)
Neutro Abs: 12.4 10*3/uL — ABNORMAL HIGH (ref 1.7–7.7)
Neutrophils Relative %: 91 % — ABNORMAL HIGH (ref 43–77)
Platelets: 274 10*3/uL (ref 150–400)
RBC: 3.56 MIL/uL — AB (ref 4.22–5.81)
RDW: 15.1 % (ref 11.5–15.5)
WBC: 13.6 10*3/uL — ABNORMAL HIGH (ref 4.0–10.5)

## 2014-07-07 LAB — MRSA PCR SCREENING: MRSA BY PCR: POSITIVE — AB

## 2014-07-07 LAB — LACTIC ACID, PLASMA: Lactic Acid, Venous: 1.2 mmol/L (ref 0.5–2.0)

## 2014-07-07 MED ORDER — MUPIROCIN 2 % EX OINT
1.0000 "application " | TOPICAL_OINTMENT | Freq: Two times a day (BID) | CUTANEOUS | Status: AC
  Administered 2014-07-07 – 2014-07-10 (×9): 1 via NASAL
  Filled 2014-07-07 (×2): qty 22

## 2014-07-07 MED ORDER — CHLORHEXIDINE GLUCONATE CLOTH 2 % EX PADS
6.0000 | MEDICATED_PAD | Freq: Every day | CUTANEOUS | Status: AC
  Administered 2014-07-07 – 2014-07-11 (×5): 6 via TOPICAL

## 2014-07-07 MED ORDER — PRO-STAT SUGAR FREE PO LIQD
30.0000 mL | Freq: Three times a day (TID) | ORAL | Status: DC
  Administered 2014-07-07 – 2014-07-12 (×14): 30 mL via ORAL
  Filled 2014-07-07 (×14): qty 30

## 2014-07-07 MED ORDER — PIPERACILLIN-TAZOBACTAM 3.375 G IVPB
3.3750 g | Freq: Three times a day (TID) | INTRAVENOUS | Status: DC
  Administered 2014-07-07 – 2014-07-12 (×15): 3.375 g via INTRAVENOUS
  Filled 2014-07-07 (×19): qty 50

## 2014-07-07 MED ORDER — ACETAMINOPHEN 325 MG PO TABS
650.0000 mg | ORAL_TABLET | Freq: Four times a day (QID) | ORAL | Status: DC | PRN
  Administered 2014-07-08 – 2014-07-11 (×2): 650 mg via ORAL
  Filled 2014-07-07 (×2): qty 2

## 2014-07-07 MED ORDER — SOTALOL HCL 80 MG PO TABS
160.0000 mg | ORAL_TABLET | Freq: Two times a day (BID) | ORAL | Status: DC
  Administered 2014-07-07 – 2014-07-12 (×10): 160 mg via ORAL
  Filled 2014-07-07 (×13): qty 2

## 2014-07-07 MED ORDER — VANCOMYCIN HCL IN DEXTROSE 1-5 GM/200ML-% IV SOLN
1000.0000 mg | Freq: Two times a day (BID) | INTRAVENOUS | Status: DC
  Administered 2014-07-07 – 2014-07-08 (×4): 1000 mg via INTRAVENOUS
  Filled 2014-07-07 (×9): qty 200

## 2014-07-07 MED ORDER — DAKINS (1/4 STRENGTH) 0.125 % EX SOLN
Freq: Two times a day (BID) | CUTANEOUS | Status: AC
  Administered 2014-07-07 – 2014-07-09 (×6)
  Filled 2014-07-07: qty 473

## 2014-07-07 NOTE — Care Management Note (Addendum)
    Page 1 of 1   07/12/2014     2:45:38 PM CARE MANAGEMENT NOTE 07/12/2014  Patient:  Jon [name],Jon [name]   Account Number:  [id number]  Date Initiated:  07/07/2014  Documentation initiated by:  Sharrie RothmanBLACKWELL,Emilya Justen C  Subjective/Objective Assessment:   Pt admitted from [facility] with wound infection. Anticipate pt will discharge back to facility.     Action/Plan:   CSW is aware of pt and will arrange discharge to facility when medically stable.   Anticipated DC Date:  07/13/2014   Anticipated DC Plan:  SKILLED NURSING FACILITY  In-house referral  Clinical Social Worker      DC Planning Services  CM consult      Choice offered to / List presented to:             Status of service:  Completed, signed off Medicare Important Message given?  YES (If response is "NO", the following Medicare IM given date fields will be blank) Date Medicare IM given:  07/08/2014 Medicare IM given by:  Arlyss QueenBLACKWELL,Cissy Galbreath C Date Additional Medicare IM given:  07/12/2014 Additional Medicare IM given by:  Sharrie RothmanAMMY C Jolyne Laye  Discharge Disposition:  SKILLED NURSING FACILITY  Per UR Regulation:    If discussed at Long Length of Stay Meetings, dates discussed:   07/12/2014    Comments:  07/12/14 1445 Arlyss Queenammy Beckey Polkowski, RN BSN CM Pt discharged to [facility] today to continue IV AB. No CM needs noted. CSW to arrange discharge to facility.  07/11/14 1355 Arlyss Queenammy Paislie Tessler, RN BSN CM Pt had surgery today. Anticipate discharge back to SNF on 07/12/14 per the surgeon. Pt will discharge back to facility on IV AB for infected wound. 07/08/14 1110 Arlyss Queenammy Nicolas Banh, RN BSN CM  07/07/14 1055 Arlyss Queenammy Ulonda Klosowski, Charity fundraiserN BSN CM

## 2014-07-07 NOTE — Consult Note (Signed)
Reason for Consult: Sacral decubitus ulcer Referring Physician: [facility]  Jon Jon [name] is an 81 y.o. male.  HPI: Patient is an 81 year old white male nursing home patient who has Jon [name] large sacral decubitus ulcer extending to the anus. Wound care has been very difficult at the nursing home. He started developing fevers and Jon [name] request has been made for Jon [name] diverting colostomy. The patient was admitted by the hospitalist for stabilization.  Past Medical History  Diagnosis Date  . Heart disease   . Atrial fibrillation     WITH PACEMAKER  . OSA (obstructive sleep apnea)     does not use CPAP  . Memory loss   . Bleeding ulcer   . B12 deficiency   . Hypertension   . Abnormality of gait 05/03/2013  . Essential and other specified forms of tremor 05/03/2013  . Polyneuropathy in other diseases classified elsewhere 05/03/2013  . Peripheral edema   . Varicose veins     Bilateral  . Macular degeneration of left eye   . Bilateral hydronephrosis 06/15/2014  . Bladder calculi 06/15/2014  . Multiple rib fractures 06/15/2014    Past Surgical History  Procedure Laterality Date  . Pacemaker insertion  2006  . Meningioma removal  1992    POST RT FRONTAL   . Cataract extraction Bilateral 2013  . Permanent pacemaker generator change N/Jon [name] 08/13/2012    Procedure: PERMANENT PACEMAKER GENERATOR CHANGE;  Surgeon: Sanda Klein, MD;  Location: [facility] CATH [facility];  Service: Cardiovascular;  Laterality: N/Jon [name];    Family History  Problem Relation Age of Onset  . Aortic aneurysm Mother   . Parkinsonism Mother   . Heart disease Father   . Cancer Sister     esaphageal cancer    Social History:  reports that he has quit smoking. His smoking use included Cigarettes. He has never used smokeless tobacco. He reports that he does not drink alcohol or use illicit drugs.  Allergies:  Allergies  Allergen Reactions  . Iohexol      Desc: PT STATES THROAT/FACE SWELLING W/ IVP DYE IN 1990. PT HAS NOT HAD ANY  EXAMS DONE W/DYE SINCE AND HAS NEVER BEEN PRE MEDICATED. PER PT, Onset Date: [id number]   . Dilantin [Phenytoin Sodium Extended] Rash  . Tegretol [Carbamazepine] Rash    Medications: I have reviewed the patient's current medications.  Results for orders placed or performed during the hospital encounter of 07/06/14 (from the past 48 hour(s))  Basic metabolic panel     Status: Abnormal   Collection Time: 07/06/14  7:31 PM  Result Value Ref Range   Sodium 129 (L) 135 - 145 mmol/L    Comment: Please note change in reference range.   Potassium 3.6 3.5 - 5.1 mmol/L    Comment: Please note change in reference range.   Chloride 94 (L) 96 - 112 mEq/L   CO2 28 19 - 32 mmol/L   Glucose, Bld 129 (H) 70 - 99 mg/dL   BUN 11 6 - 23 mg/dL   Creatinine, Ser 0.55 0.50 - 1.35 mg/dL   Calcium 7.8 (L) 8.4 - 10.5 mg/dL   GFR calc non Af Amer >90 >90 mL/min   GFR calc Af Amer >90 >90 mL/min    Comment: (NOTE) The eGFR has been calculated using the CKD EPI equation. This calculation has not been validated in all clinical situations. eGFR's persistently <90 mL/min signify possible Chronic Kidney Disease.    Anion gap 7 5 - 15  CBC with Differential  Status: Abnormal   Collection Time: 07/06/14  7:31 PM  Result Value Ref Range   WBC 16.6 (H) 4.0 - 10.5 K/uL   RBC 3.99 (L) 4.22 - 5.81 MIL/uL   Hemoglobin 11.2 (L) 13.0 - 17.0 g/dL   HCT 33.1 (L) 39.0 - 52.0 %   MCV 83.0 78.0 - 100.0 fL   MCH 28.1 26.0 - 34.0 pg   MCHC 33.8 30.0 - 36.0 g/dL   RDW 15.0 11.5 - 15.5 %   Platelets 273 150 - 400 K/uL   Neutrophils Relative % 88 (H) 43 - 77 %   Neutro Abs 14.5 (H) 1.7 - 7.7 K/uL   Lymphocytes Relative 5 (L) 12 - 46 %   Lymphs Abs 0.9 0.7 - 4.0 K/uL   Monocytes Relative 7 3 - 12 %   Monocytes Absolute 1.2 (H) 0.1 - 1.0 K/uL   Eosinophils Relative 0 0 - 5 %   Eosinophils Absolute 0.1 0.0 - 0.7 K/uL   Basophils Relative 0 0 - 1 %   Basophils Absolute 0.0 0.0 - 0.1 K/uL  Culture, blood (routine x 2)      Status: None (Preliminary result)   Collection Time: 07/06/14  9:38 PM  Result Value Ref Range   Specimen Description PORTA CATH    Special Requests BOTTLES DRAWN AEROBIC AND ANAEROBIC 10CC EACH    Culture PENDING    Report Status PENDING   Culture, blood (routine x 2)     Status: None (Preliminary result)   Collection Time: 07/06/14  9:38 PM  Result Value Ref Range   Specimen Description BLOOD LEFT FOREARM    Special Requests BOTTLES DRAWN AEROBIC AND ANAEROBIC 6CC EACH    Culture PENDING    Report Status PENDING   MRSA PCR Screening     Status: Abnormal   Collection Time: 07/06/14 11:15 PM  Result Value Ref Range   MRSA by PCR POSITIVE (Jon [name]) NEGATIVE    Comment:        The GeneXpert MRSA Assay (FDA approved for NASAL specimens only), is one component of Jon [name] comprehensive MRSA colonization surveillance program. It is not intended to diagnose MRSA infection nor to guide or monitor treatment for MRSA infections. RESULT CALLED TO, READ BACK BY AND VERIFIED WITH: HANDY L AT 0052 ON [id number] BY FORSYTH K   Comprehensive metabolic panel     Status: Abnormal   Collection Time: 07/07/14  5:00 AM  Result Value Ref Range   Sodium 129 (L) 135 - 145 mmol/L    Comment: Please note change in reference range.   Potassium 3.6 3.5 - 5.1 mmol/L    Comment: Please note change in reference range.   Chloride 94 (L) 96 - 112 mEq/L   CO2 29 19 - 32 mmol/L   Glucose, Bld 123 (H) 70 - 99 mg/dL   BUN 11 6 - 23 mg/dL   Creatinine, Ser 0.55 0.50 - 1.35 mg/dL   Calcium 7.4 (L) 8.4 - 10.5 mg/dL   Total Protein 4.6 (L) 6.0 - 8.3 g/dL   Albumin 1.9 (L) 3.5 - 5.2 g/dL   AST 21 0 - 37 U/L   ALT 17 0 - 53 U/L   Alkaline Phosphatase 80 39 - 117 U/L   Total Bilirubin 0.7 0.3 - 1.2 mg/dL   GFR calc non Af Amer >90 >90 mL/min   GFR calc Af Amer >90 >90 mL/min    Comment: (NOTE) The eGFR has been calculated using the CKD EPI equation. This  calculation has not been validated in all clinical  situations. eGFR's persistently <90 mL/min signify possible Chronic Kidney Disease.    Anion gap 6 5 - 15  CBC WITH DIFFERENTIAL     Status: Abnormal   Collection Time: 07/07/14  5:00 AM  Result Value Ref Range   WBC 13.6 (H) 4.0 - 10.5 K/uL   RBC 3.56 (L) 4.22 - 5.81 MIL/uL   Hemoglobin 10.1 (L) 13.0 - 17.0 g/dL   HCT 29.8 (L) 39.0 - 52.0 %   MCV 83.7 78.0 - 100.0 fL   MCH 28.4 26.0 - 34.0 pg   MCHC 33.9 30.0 - 36.0 g/dL   RDW 15.1 11.5 - 15.5 %   Platelets 274 150 - 400 K/uL   Neutrophils Relative % 91 (H) 43 - 77 %   Neutro Abs 12.4 (H) 1.7 - 7.7 K/uL   Lymphocytes Relative 4 (L) 12 - 46 %   Lymphs Abs 0.6 (L) 0.7 - 4.0 K/uL   Monocytes Relative 5 3 - 12 %   Monocytes Absolute 0.6 0.1 - 1.0 K/uL   Eosinophils Relative 0 0 - 5 %   Eosinophils Absolute 0.0 0.0 - 0.7 K/uL   Basophils Relative 0 0 - 1 %   Basophils Absolute 0.0 0.0 - 0.1 K/uL  Lactic acid, plasma     Status: None   Collection Time: 07/07/14  6:30 AM  Result Value Ref Range   Lactic Acid, Venous 1.2 0.5 - 2.0 mmol/L    Dg Chest Port 1 View  07/05/2014   CLINICAL DATA:  PICC placement  EXAM: PORTABLE CHEST - 1 VIEW  COMPARISON:  06/15/2014  FINDINGS: Right arm PICC has been placed with tip in the SVC at the cavoatrial junction in good position.  Heart size upper normal. Dual lead pacemaker unchanged. Lungs remain clear.  IMPRESSION: PICC tip in the lower SVC in good position.   Electronically Signed   By: Franchot Gallo M.D.   On: 07/05/2014 14:36    ROS: See chart Blood pressure 93/51, pulse 92, temperature 98 F (36.7 C), temperature source Oral, resp. rate 18, height '5\' 10"'  (1.778 m), weight 84.505 kg (186 lb 4.8 oz), SpO2 92 %. Physical Exam: Bedridden white male who is noncommunicative Abdomen soft, nontender, nondistended. Sacrum has Jon [name] stage IV decubitus ulcer was significant skin loss and fitness exudate present at the base. It is foul-smelling. It extends up to the anus.  Assessment/Plan: Impression:  Large nonhealing sacral decubitus ulcer with difficulty controlling stooling. Plan: Patient does have Jon [name] leukocytosis and will defer to the hospitalist for workup. Agree with need for diverting colostomy. Will temporarily schedule this for 07/11/2014.  Jon Jon [name] 07/07/2014, 8:25 AM

## 2014-07-07 NOTE — Clinical Social Work Psychosocial (Signed)
Clinical Social Work Department BRIEF PSYCHOSOCIAL ASSESSMENT 07/07/2014  Patient:  ARLON, BLEIER     Account Number:  [id number]     Admit date:  07/06/2014  Clinical Social Worker:  Wyatt Haste  Date/Time:  07/07/2014 11:51 AM  Referred by:  Physician  Date Referred:  07/07/2014 Referred for  SNF Placement   Other Referral:   Interview type:  Patient Other interview type:   son- [name]    PSYCHOSOCIAL DATA Living Status:  FACILITY Admitted from facility:  [facility] Level of care:  [place] Primary support name:  Sherren Mocha Primary support relationship to patient:  CHILD, ADULT Degree of support available:   supportive    CURRENT CONCERNS Current Concerns  Post-Acute Placement   Other Concerns:    SOCIAL WORK ASSESSMENT / PLAN CSW met with pt and pt's son, Sherren Mocha at bedside. Pt alert and oriented, but deferred most of assessment to [facility]. Pt has been a resident at [facility] for the past 3 weeks. They report he has made some progress with PT, but his wound has become the bigger issue now. Pt has a large sacral decubitus ulcer. Pt will have diverting colostomy, scheduled for Monday. Prior to SNF, pt lived at home with his wife and son. Pt's son is primary caregiver for pt's wife. Pt is very hopeful to be able to return home, but understands need to return to SNF after procedure. He has had somewhat of a difficult time adjusting to not being independent. Brief support provided. Pt was hoping to go to [facility] last time and asked CSW to speak with them again. [facility] reviewed information and cannot offer a bed. Pt is agreeable to return to [facility]. Per Jackelyn Poling at facility, pt is skilled level of care and okay to return. She is aware of scheduled procedure.   Assessment/plan status:  Psychosocial Support/Ongoing Assessment of Needs Other assessment/ plan:   Information/referral to community resources:   [facility]    PATIENT'S/FAMILY'S RESPONSE TO PLAN OF CARE: Pt and  son agreeable to return to [facility] when medically stable. CSW will continue to follow.       Benay Pike, [place]

## 2014-07-07 NOTE — Progress Notes (Signed)
Notified Dr. Irene LimboGoodrich of EKG A-fib with RVR. Son present at bedside.

## 2014-07-07 NOTE — Progress Notes (Signed)
PROGRESS NOTE  Jon [name] ZOX:[id number] DOB: [birth year] DOA: 07/06/2014 PCP: Milana Obey, MD  Summary: 81 year old man with third hospitalization in the last 30 days who was at skilled nursing facility for rehabilitation and wound care of decubitus ulcers in the sacral area. According to chart review he was started on IV antibiotics in the last 48 hours prior to admission as an outpatient for worsening of the ulcer (according to chart cefepime and vancomycin). He was subsequently transferred to the emergency department and admitted for surgical evaluation, sacral wound infection, hyponatremia.  According to nursing documentation patient was sent for altered mental status and continuous oxygen  Assessment/Plan: 1. Large stage IV decubitus ulcer with possible infection. Afebrile. Leukocytosis has improved. Blood pressure labile with low normal blood pressures, likely chronic based on previous history of low blood pressures on last admission. 2. Atrial fibrillation with rapid ventricular response. Likely secondary to discontinuation of sotalol on admission. Patient on sotalol as outpatient. Cardizem was stopped last admission secondary to bradycardia and hypotension on his last admission (discharge 1/2). History of pacemaker placement. 3. Hyponatremia, hypochloremia secondary to dehydration most likely. 4. Low normal blood pressure appears to be chronic. No evidence of suggest sepsis at this point. 5. Bilateral hydronephrosis with multiple bladder calculi seen by CT 05/2014. 6. Displaced rib fractures from traumatic fall 05/2014.  7. Dementia, essential tremor, polyneuropathy, B12 deficiency, gait abnormality  8. The third hospitalization in the last 30 days. Admitted twice in December, initially for generalized weakness, dehydration, mild rhabdomyolysis and fall at home.  Admitted 12/30-1/2 for failure to thrive, inability to care for self, multiple decubitus ulcers with skin  breakdown, obstructive uropathy by CT with placement of Foley catheter, acute injury, UTI.   Continue empiric antibiotics. WBC improving. Afebrile. Chronic low blood pressure. Appreciate surgical evaluation, likely to proceed with surgery next week.  Resume sotalol, monitor heart rate on telemetry. Patient is asymptomatic. Heart rate control currently better.  IV fluids. Recheck basic metabolic panel in the morning.  Discussed in detail with son at bedside.  Patient appears chronically ill long-term prognosis is guarded.  Code Status: full code DVT prophylaxis: heparin Family Communication:  Disposition Plan: return to SNF  Brendia Sacks, MD  [facility]  Pager [phone] If 7PM-7AM, please contact night-coverage at [email or web address], password [facility] 07/07/2014, 10:49 AM  LOS: 1 day   Consultants:  General surgery  Procedures:  1/19 (prior to admission, outpatient) PICC line placement  Antibiotics:  Vancomycin 1/20 >>  Zosyn 1/20 >>  HPI/Subjective: Seen by surgery. Tentative plans made for diverting colostomy Monday 1/25.  Overall the patient feels okay. He does complain of some knee pain. Son at bedside.  Objective: Filed Vitals:   07/06/14 2130 07/06/14 2141 07/06/14 2312 07/07/14 0640  BP: 111/70 111/70 95/49 93/51   Pulse:  100 95 92  Temp:  98.3 F (36.8 C) 98.8 F (37.1 C) 98 F (36.7 C)  TempSrc:  Oral Oral Oral  Resp:  Height:    (1.778 m)   Weight:   84.505 kg (186 lb 4.8 oz)   SpO2:  97% 94% 92%    Intake/Output Summary (Last 24 hours) at 07/07/14 1049 Last data filed at 07/07/14 0800  Gross per 24 hour  Intake    123 ml  Output    400 ml  Net   -277 ml     Filed Weights   07/06/14 1811 07/06/14 2312  Weight: 81.647 kg (180 lb) 84.505 kg (186  lb 4.8 oz)    Exam:     Labile blood pressure, noted to be tachycardic this morning. Vitals otherwise stable. No hypoxia. General: Appears calm and  comfortable Cardiovascular: Irregular, no m/r/g. No LE edema. Telemetry: Atrial fibrillation with rapid ventricular response present since 0630. Respiratory: CTA bilaterally, no w/r/r. Normal respiratory effort. Abdomen: soft, ntnd Musculoskeletal: grossly normal tone BUE/BLE, moves all extremities Psychiatric: Somewhat sedated, affect and mood appropriate. Speaks in full sentences appropriately. Neurologic: grossly non-focal.  Data Reviewed:  Sodium 129, chloride 94  Pertinent data:  Labs  Sodium 129 >>  Hemoglobin 11.2 >> 10.1  WBC 16.6 >> 13.6  Imaging     Other  EKG atrial fibrillation with rapid ventricular response, ST depression anterolaterally, consider ischemia. This appears to be chronic based on previous EKGs  Pending data  Blood cultures  Urine culture  Wound culture  Scheduled Meds: . antiseptic oral rinse  7 mL Mouth Rinse q12n4p  . chlorhexidine  15 mL Mouth Rinse BID  . Chlorhexidine Gluconate Cloth  6 each Topical Q0600  . heparin  5,000 Units Subcutaneous 3 times per day  . mupirocin ointment  1 application Nasal BID  . pantoprazole  40 mg Oral Daily  . piperacillin-tazobactam (ZOSYN)  IV  3.375 g Intravenous Q8H  . sodium chloride  3 mL Intravenous Q12H  . vancomycin  1,000 mg Intravenous Q12H   Continuous Infusions: . sodium chloride 75 mL/hr at 07/07/14 0011    Active Problems:   Perianal infection   Decubitus ulcer   Sacral ulcer   Time spent 20 minutes

## 2014-07-07 NOTE — Progress Notes (Signed)
UR chart review completed.  

## 2014-07-07 NOTE — Progress Notes (Signed)
ANTIBIOTIC CONSULT NOTE - INITIAL  Pharmacy Consult for Vancomycin and Zosyn Indication: wound infection  Allergies  Allergen Reactions  . Iohexol      Desc: PT STATES THROAT/FACE SWELLING W/ IVP DYE IN 1990. PT HAS NOT HAD ANY EXAMS DONE W/DYE SINCE AND HAS NEVER BEEN PRE MEDICATED. PER PT, Onset Date: [id number]   . Dilantin [Phenytoin Sodium Extended] Rash  . Tegretol [Carbamazepine] Rash   Patient Measurements: Height: 5\' 10"  (177.8 cm) Weight: 186 lb 4.8 oz (84.505 kg) IBW/kg (Calculated) : 73  Vital Signs: Temp: 98 F (36.7 C) (01/21 0640) Temp Source: Oral (01/21 0640) BP: 93/51 mmHg (01/21 0640) Pulse Rate: 92 (01/21 0640) Intake/Output from previous day: 01/20 0701 - 01/21 0700 In: 3 [I.V.:3] Out: 400 [Urine:400] Intake/Output from this shift: Total I/O In: 120 [P.O.:120] Out: -   Labs:  Recent Labs  07/06/14 1931 07/07/14 0500  WBC 16.6* 13.6*  HGB 11.2* 10.1*  PLT 273 274  CREATININE 0.55 0.55   Estimated Creatinine Clearance: 76 mL/min (by C-G formula based on Cr of 0.55). No results for input(s): VANCOTROUGH, VANCOPEAK, VANCORANDOM, GENTTROUGH, GENTPEAK, GENTRANDOM, TOBRATROUGH, TOBRAPEAK, TOBRARND, AMIKACINPEAK, AMIKACINTROU, AMIKACIN in the last 72 hours.   Microbiology: Recent Results (from the past 720 hour(s))  Urine culture     Status: None   Collection Time: 06/15/14  2:40 AM  Result Value Ref Range Status   Specimen Description URINE, CATHETERIZED  Final   Special Requests NONE  Final   Colony Count   Final    >=100,000 COLONIES/ML Performed at [facility]    Culture   Final    ENTEROCOCCUS SPECIES Performed at [facility]    Report Status 06/19/2014 FINAL  Final   Organism ID, Bacteria ENTEROCOCCUS SPECIES  Final      Susceptibility   Enterococcus species - MIC*    AMPICILLIN <=2 SENSITIVE Sensitive     LEVOFLOXACIN 1 SENSITIVE Sensitive     NITROFURANTOIN <=16 SENSITIVE Sensitive     VANCOMYCIN 2 SENSITIVE  Sensitive     TETRACYCLINE >=16 RESISTANT Resistant     * ENTEROCOCCUS SPECIES  Clostridium Difficile by PCR     Status: None   Collection Time: 06/15/14  4:06 AM  Result Value Ref Range Status   C difficile by pcr NEGATIVE NEGATIVE Final  MRSA PCR Screening     Status: None   Collection Time: 06/15/14  2:00 PM  Result Value Ref Range Status   MRSA by PCR NEGATIVE NEGATIVE Final    Comment:        The GeneXpert MRSA Assay (FDA approved for NASAL specimens only), is one component of a comprehensive MRSA colonization surveillance program. It is not intended to diagnose MRSA infection nor to guide or monitor treatment for MRSA infections.   Culture, blood (routine x 2)     Status: None (Preliminary result)   Collection Time: 07/06/14  9:38 PM  Result Value Ref Range Status   Specimen Description PORTA CATH  Final   Special Requests BOTTLES DRAWN AEROBIC AND ANAEROBIC 10CC EACH  Final   Culture NO GROWTH 1 DAY  Final   Report Status PENDING  Incomplete  Culture, blood (routine x 2)     Status: None (Preliminary result)   Collection Time: 07/06/14  9:38 PM  Result Value Ref Range Status   Specimen Description BLOOD LEFT FOREARM  Final   Special Requests BOTTLES DRAWN AEROBIC AND ANAEROBIC 6CC EACH  Final   Culture NO GROWTH 1  DAY  Final   Report Status PENDING  Incomplete  MRSA PCR Screening     Status: Abnormal   Collection Time: 07/06/14 11:15 PM  Result Value Ref Range Status   MRSA by PCR POSITIVE (A) NEGATIVE Final    Comment:        The GeneXpert MRSA Assay (FDA approved for NASAL specimens only), is one component of a comprehensive MRSA colonization surveillance program. It is not intended to diagnose MRSA infection nor to guide or monitor treatment for MRSA infections. RESULT CALLED TO, READ BACK BY AND VERIFIED WITH: HANDY L AT 0052 ON [id number] BY FORSYTH K    Medical History: Past Medical History  Diagnosis Date  . Heart disease   . Atrial fibrillation      WITH PACEMAKER  . OSA (obstructive sleep apnea)     does not use CPAP  . Memory loss   . Bleeding ulcer   . B12 deficiency   . Hypertension   . Abnormality of gait 05/03/2013  . Essential and other specified forms of tremor 05/03/2013  . Polyneuropathy in other diseases classified elsewhere 05/03/2013  . Peripheral edema   . Varicose veins     Bilateral  . Macular degeneration of left eye   . Bilateral hydronephrosis 06/15/2014  . Bladder calculi 06/15/2014  . Multiple rib fractures 06/15/2014   Anti-infectives    Start     Dose/Rate Route Frequency Ordered Stop   07/07/14 1000  piperacillin-tazobactam (ZOSYN) IVPB 3.375 g     3.375 g12.5 mL/hr over 240 Minutes Intravenous Every 8 hours 07/07/14 0914     07/07/14 1000  vancomycin (VANCOCIN) IVPB 1000 mg/200 mL premix     1,000 mg200 mL/hr over 60 Minutes Intravenous Every 12 hours 07/07/14 0914     07/06/14 2230  vancomycin (VANCOCIN) 1,500 mg in sodium chloride 0.9 % 500 mL IVPB     1,500 mg250 mL/hr over 120 Minutes Intravenous  Once 07/06/14 2227 07/07/14 0206   07/06/14 2230  piperacillin-tazobactam (ZOSYN) IVPB 3.375 g     3.375 g100 mL/hr over 30 Minutes Intravenous  Once 07/06/14 2227 07/07/14 0036     Assessment: HPI: Patient is an 81 year old white male nursing home patient who has a large sacral decubitus ulcer extending to the anus. Wound care has been very difficult at the nursing home. He started developing fevers and a request has been made for a diverting colostomy. The patient was admitted by the hospitalist for stabilization.  Estimated Creatinine Clearance: 76 mL/min (by C-G formula based on Cr of 0.55).  Goal of Therapy:  Vancomycin trough level 10-15 mcg/ml (10-39mcg/ml would be acceptable) Eradicate infection.  Plan:  Vancomycin  IV on admission then  IV q12hrs Check trough at steady state Zosyn 3.375gm IV q8h, each dose over 4 hrs Monitor labs, renal fxn, and cultures  Valrie Hart  A 07/07/2014,11:50 AM

## 2014-07-08 DIAGNOSIS — E876 Hypokalemia: Secondary | ICD-10-CM

## 2014-07-08 LAB — CBC
HCT: 30 % — ABNORMAL LOW (ref 39.0–52.0)
Hemoglobin: 9.9 g/dL — ABNORMAL LOW (ref 13.0–17.0)
MCH: 27.9 pg (ref 26.0–34.0)
MCHC: 33 g/dL (ref 30.0–36.0)
MCV: 84.5 fL (ref 78.0–100.0)
PLATELETS: 303 10*3/uL (ref 150–400)
RBC: 3.55 MIL/uL — AB (ref 4.22–5.81)
RDW: 15.2 % (ref 11.5–15.5)
WBC: 11.5 10*3/uL — ABNORMAL HIGH (ref 4.0–10.5)

## 2014-07-08 LAB — URINE CULTURE
CULTURE: NO GROWTH
Colony Count: NO GROWTH

## 2014-07-08 LAB — BASIC METABOLIC PANEL
ANION GAP: 8 (ref 5–15)
BUN: 12 mg/dL (ref 6–23)
CHLORIDE: 97 meq/L (ref 96–112)
CO2: 27 mmol/L (ref 19–32)
Calcium: 7.7 mg/dL — ABNORMAL LOW (ref 8.4–10.5)
Creatinine, Ser: 0.67 mg/dL (ref 0.50–1.35)
GFR, EST NON AFRICAN AMERICAN: 88 mL/min — AB (ref >90)
Glucose, Bld: 104 mg/dL — ABNORMAL HIGH (ref 70–99)
Potassium: 3.3 mmol/L — ABNORMAL LOW (ref 3.5–5.1)
Sodium: 132 mmol/L — ABNORMAL LOW (ref 135–145)

## 2014-07-08 MED ORDER — ALPRAZOLAM 0.5 MG PO TABS
0.5000 mg | ORAL_TABLET | Freq: Two times a day (BID) | ORAL | Status: DC | PRN
  Administered 2014-07-08 – 2014-07-09 (×2): 0.5 mg via ORAL
  Filled 2014-07-08 (×2): qty 1

## 2014-07-08 MED ORDER — HYDROCODONE-ACETAMINOPHEN 5-325 MG PO TABS
1.0000 | ORAL_TABLET | Freq: Four times a day (QID) | ORAL | Status: DC | PRN
  Administered 2014-07-08 – 2014-07-12 (×10): 1 via ORAL
  Filled 2014-07-08 (×11): qty 1

## 2014-07-08 MED ORDER — POTASSIUM CHLORIDE 10 MEQ/100ML IV SOLN: 10.0000 meq | INTRAVENOUS | Status: DC

## 2014-07-08 MED ORDER — POTASSIUM CHLORIDE CRYS ER 20 MEQ PO TBCR
40.0000 meq | EXTENDED_RELEASE_TABLET | Freq: Once | ORAL | Status: AC
  Administered 2014-07-08: 40 meq via ORAL
  Filled 2014-07-08: qty 2

## 2014-07-08 NOTE — Progress Notes (Signed)
PROGRESS NOTE  Jon CutterWilliam P Stecklein ZOX:[id number] DOB: 07-04-1931 DOA: 07/06/2014 PCP: Milana ObeyKNOWLTON,STEPHEN D, MD  Summary: 81 year old man with third hospitalization in the last 30 days who was at skilled nursing facility for rehabilitation and wound care of decubitus ulcers in the sacral area. According to chart review he was started on IV antibiotics in the last 48 hours prior to admission as an outpatient for worsening of the ulcer (according to chart cefepime and vancomycin). He was subsequently transferred to the emergency department and admitted for surgical evaluation, sacral wound infection, hyponatremia.  According to nursing documentation patient was sent for altered mental status and continuous oxygen  Assessment/Plan: 1. Large stage IV decubitus ulcer with possible infection. Afebrile. Leukocytosis has improved. Blood pressure labile with low normal blood pressures, likely chronic based on previous history of low blood pressures on last admission. Appears stable. 2. Atrial fibrillation. Rate controlled on sotalol. Cardizem was stopped last admission secondary to bradycardia and hypotension on his last admission (discharge 1/2). History of pacemaker placement. 3. Hypokalemia. 4. Hyponatremia secondary to dehydration most likely. Improving. 5. Low normal blood pressure appears to be chronic. No evidence of suggest sepsis at this point. 6. Bilateral hydronephrosis with multiple bladder calculi seen by CT 05/2014. 7. Displaced rib fractures from traumatic fall 05/2014.  8. Dementia, essential tremor, polyneuropathy, B12 deficiency, gait abnormality  9. The third hospitalization in the last 30 days. Admitted twice in December, initially for generalized weakness, dehydration, mild rhabdomyolysis and fall at home.  Admitted 12/30-1/2 for failure to thrive, inability to care for self, multiple decubitus ulcers with skin breakdown, obstructive uropathy by CT with placement of Foley catheter, acute  injury, UTI.   Continue empiric antibiotics  Replace potassium.  Decrease IV fluids, saline lock later today  Discussed in detail with Dr. Lovell SheehanJenkins, he recommends keeping the patient hospital until surgery on Monday  Code Status: full code DVT prophylaxis: heparin Family Communication:  Disposition Plan: return to SNF  Brendia Sacksaniel Goodrich, MD  [facility]  Pager [phone] If 7PM-7AM, please contact night-coverage at [email or web address], password [email or web address] 07/08/2014, 11:02 AM  LOS: 2 days   Consultants:  General surgery  Procedures:  1/19 (prior to admission, outpatient) PICC line placement  Antibiotics:  Vancomycin 1/20 >>  Zosyn 1/20 >>  HPI/Subjective: He feels better today. Pain is improved. Breathing fine. No nausea or vomiting. Tolerating diet.  Objective: Filed Vitals:   07/07/14 0640 07/07/14 1430 07/07/14 2339 07/08/14 0615  BP: 93/51 94/52 142/91 98/76  Pulse: 92 91 71 82  Temp: 98 F (36.7 C) 98 F (36.7 C) 98.6 F (37 C) 97.6 F (36.4 C)  TempSrc: Oral Oral Oral Oral  Resp: 18 18 20 20   Height:      Weight:    87.907 kg (193 lb 12.8 oz)  SpO2: 92% 94% 98% 98%    Intake/Output Summary (Last 24 hours) at 07/08/14 1102 Last data filed at 07/07/14 1801  Gross per 24 hour  Intake    915 ml  Output      0 ml  Net    915 ml     Filed Weights   07/06/14 1811 07/06/14 2312 07/08/14 0615  Weight: 81.647 kg (180 lb) 84.505 kg (186 lb 4.8 oz) 87.907 kg (193 lb 12.8 oz)    Exam:    Afebrile, blood pressures are labile, no hypoxia General:  Appears comfortable, calm. Appears better today. Very alert and talkative. Cardiovascular: Irregular, atrial fibrillation/paced rhythm on telemetry. No murmur, rub or gallop. No  lower extremity edema. Respiratory: Clear to auscultation bilaterally, no wheezes, rales or rhonchi. Normal respiratory effort. Skin: Chronic venous stasis changes bilateral lower extremities Musculoskeletal: Generally weak, no focal  deficits. Able to lift both legs weekly. Psychiatric: grossly normal mood and affect, speech fluent and appropriate  Data Reviewed:  Potassium 3.3. Sodium has improved, 132. Creatinine is normal.  Hemoglobin is stable at 9.9  Pertinent data:  Labs  Sodium 129 >> 132  Hemoglobin 11.2 >> 10.1 >> 9.9  WBC 16.6 >> 13.6 >> 11.5  Imaging     Other  EKG atrial fibrillation with rapid ventricular response, ST depression anterolaterally, consider ischemia. This appears to be chronic based on previous EKGs  Pending data  Blood cultures no growth thus far  Urine culture  Wound culture  Scheduled Meds: . antiseptic oral rinse  7 mL Mouth Rinse q12n4p  . chlorhexidine  15 mL Mouth Rinse BID  . Chlorhexidine Gluconate Cloth  6 each Topical Q0600  . feeding supplement (PRO-STAT SUGAR FREE 64)  30 mL Oral TID WC  . heparin  5,000 Units Subcutaneous 3 times per day  . mupirocin ointment  1 application Nasal BID  . pantoprazole  40 mg Oral Daily  . piperacillin-tazobactam (ZOSYN)  IV  3.375 g Intravenous Q8H  . potassium chloride  40 mEq Oral Once  . sodium chloride  3 mL Intravenous Q12H  . sodium hypochlorite   Irrigation BID  . sotalol  160 mg Oral BID  . vancomycin  1,000 mg Intravenous Q12H   Continuous Infusions: . sodium chloride 100 mL/hr at 07/08/14 0604    Principal Problem:   Decubitus ulcer Active Problems:   Atrial fibrillation   Hyponatremia   Anemia   Sacral ulcer   Time spent 20 minutes

## 2014-07-09 LAB — BASIC METABOLIC PANEL
ANION GAP: 5 (ref 5–15)
BUN: 13 mg/dL (ref 6–23)
CALCIUM: 7.8 mg/dL — AB (ref 8.4–10.5)
CHLORIDE: 103 mmol/L (ref 96–112)
CO2: 28 mmol/L (ref 19–32)
CREATININE: 0.64 mg/dL (ref 0.50–1.35)
Glucose, Bld: 84 mg/dL (ref 70–99)
POTASSIUM: 3.7 mmol/L (ref 3.5–5.1)
Sodium: 136 mmol/L (ref 135–145)

## 2014-07-09 LAB — WOUND CULTURE

## 2014-07-09 LAB — VANCOMYCIN, TROUGH: Vancomycin Tr: 21.1 ug/mL — ABNORMAL HIGH (ref 10.0–20.0)

## 2014-07-09 MED ORDER — VANCOMYCIN HCL IN DEXTROSE 750-5 MG/150ML-% IV SOLN
750.0000 mg | Freq: Two times a day (BID) | INTRAVENOUS | Status: DC
  Administered 2014-07-09 – 2014-07-12 (×6): 750 mg via INTRAVENOUS
  Filled 2014-07-09 (×8): qty 150

## 2014-07-09 NOTE — Progress Notes (Signed)
PROGRESS NOTE  [name] Bunney BJY:[id number] DOB: 04-Apr-1932 DOA: 07/06/2014 PCP: Milana ObeyKNOWLTON,STEPHEN D, MD  Summary: 81 year old man with third hospitalization in the last 30 days who was at skilled nursing facility for rehabilitation and wound care of decubitus ulcers in the sacral area. According to chart review he was started on IV antibiotics in the last 48 hours prior to admission as an outpatient for worsening of the ulcer (according to chart cefepime and vancomycin). He was subsequently transferred to the emergency department and admitted for surgical evaluation, sacral wound infection, hyponatremia.  Assessment/Plan: 1. Large stage IV decubitus ulcer with suspected infection. Remains afebrile. Hemodynamics are stable. Does have chronic low normal blood pressure. Continue wound care. 2. Atrial fibrillation. Rate controlled. Continue sotalol. History of pacemaker placement. 3. Left upper extremity edema, significance unclear. No pain. Doubt DVT. Given asymmetry will check ultrasound. 4. Hypokalemia repleted. 5. Hyponatremia has resolved. 6. History of bilateral hydronephrosis with multiple bladder calculi seen 05/2014. 7. History of rib fractures secondary to traumatic fall 05/2014. 8. History dementia, essential tremor, polyneuropathy, B12 deficiency, gait abnormality   Continue empiric antibiotics, he feels better and hemodynamics are stable. Plan for diverting colostomy 1/25. He has no history of chest pain or coronary disease. Moderate risk based on age and debility. No further testing recommended.  Check with upper extremity venous Doppler  Code Status: full code DVT prophylaxis: heparin Family Communication:  Disposition Plan: return to SNF  Brendia Sacksaniel Goodrich, MD  [facility]  Pager [phone] If 7PM-7AM, please contact night-coverage at [email or web address], password [email or web address] 07/09/2014, 2:10 PM  LOS: 3 days   Consultants:  General surgery  Procedures:  1/19 (prior to  admission, outpatient) PICC line placement  Antibiotics:  Vancomycin 1/20 >>  Zosyn 1/20 >>  HPI/Subjective: "I am feeling pretty good". Tolerating his diet. No vomiting. Nursing did note some edema of the left upper extremity with some blistering.  Objective: Filed Vitals:   07/08/14 1528 07/08/14 2045 07/09/14 0634 07/09/14 1300  BP: 111/61 129/80 133/75 133/80  Pulse: 68 70 70 81  Temp: 97.8 F (36.6 C) 97.7 F (36.5 C) 97.6 F (36.4 C) 97 F (36.1 C)  TempSrc: Oral Oral Axillary Oral  Resp: 20 20 21 20   Height:      Weight:      SpO2: 99% 99% 100% 100%    Intake/Output Summary (Last 24 hours) at 07/09/14 1410 Last data filed at 07/09/14 1140  Gross per 24 hour  Intake   1578 ml  Output   2450 ml  Net   -872 ml     Filed Weights   07/06/14 1811 07/06/14 2312 07/08/14 0615  Weight: 81.647 kg (180 lb) 84.505 kg (186 lb 4.8 oz) 87.907 kg (193 lb 12.8 oz)    Exam:    Afebrile, blood pressures are labile, no hypoxia General:  Appears calm and comfortable Cardiovascular: RRR, no m/r/g. No LE edema. Respiratory: CTA bilaterally, no w/r/r. Normal respiratory effort. Skin: Chronic venous stasis changes, stasis dermatitis bilateral lower extremities Musculoskeletal: grossly normal tone BUE/BLE; upper extremity strength 4/5. Bilateral lower extremity strength 3/5, poor effort, globally weak. Psychiatric: grossly normal mood and affect, speech clear, fluent and appropriate. He talks about upcoming surgery. Neurologic: grossly non-focal.  Data Reviewed:  Urine output 3700  Basic metabolic panel unremarkable, potassium and sodium is normalized  Pertinent data:  Labs  Sodium 129 >> 132 >> 136  Hemoglobin 11.2 >> 10.1 >> 9.9  WBC 16.6 >> 13.6 >> 11.5  Wound culture multiple  organisms, non-predominant.  Urine culture no growth, final  Imaging     Other  EKG atrial fibrillation with rapid ventricular response, ST depression anterolaterally, consider  ischemia. This appears to be chronic based on previous EKGs  Pending data  Blood cultures no growth thus far   Scheduled Meds: . antiseptic oral rinse  7 mL Mouth Rinse q12n4p  . chlorhexidine  15 mL Mouth Rinse BID  . Chlorhexidine Gluconate Cloth  6 each Topical Q0600  . feeding supplement (PRO-STAT SUGAR FREE 64)  30 mL Oral TID WC  . heparin  5,000 Units Subcutaneous 3 times per day  . mupirocin ointment  1 application Nasal BID  . pantoprazole  40 mg Oral Daily  . piperacillin-tazobactam (ZOSYN)  IV  3.375 g Intravenous Q8H  . sodium chloride  3 mL Intravenous Q12H  . sodium hypochlorite   Irrigation BID  . sotalol  160 mg Oral BID  . vancomycin  750 mg Intravenous Q12H   Continuous Infusions:    Principal Problem:   Decubitus ulcer Active Problems:   Atrial fibrillation   Hyponatremia   Anemia   Sacral ulcer   Time spent 25 minutes

## 2014-07-09 NOTE — Progress Notes (Signed)
ANTIBIOTIC CONSULT NOTE   Pharmacy Consult for Vancomycin and Zosyn Indication: wound infection  Allergies  Allergen Reactions  . Iohexol      Desc: PT STATES THROAT/FACE SWELLING W/ IVP DYE IN 1990. PT HAS NOT HAD ANY EXAMS DONE W/DYE SINCE AND HAS NEVER BEEN PRE MEDICATED. PER PT, Onset Date: [id number]   . Dilantin [Phenytoin Sodium Extended] Rash  . Tegretol [Carbamazepine] Rash   Patient Measurements: Height:  (177.8 cm) Weight: 193 lb 12.8 oz (87.907 kg) IBW/kg (Calculated) : 73  Vital Signs: Temp: 97.6 F (36.4 C) (01/23 0634) Temp Source: Axillary (01/23 0634) BP: 133/75 mmHg (01/23 0634) Pulse Rate: 70 (01/23 0634) Intake/Output from previous day: 01/22 0701 - 01/23 0700 In: 1875 [P.O.:570; I.V.:755; IV Piggyback:550] Out: 3700 [Urine:3700] Intake/Output from this shift:    Labs:  Recent Labs  07/06/14 1931 07/07/14 0500 07/08/14 0557 07/09/14 0537  WBC 16.6* 13.6* 11.5*  --   HGB 11.2* 10.1* 9.9*  --   PLT 273 274 303  --   CREATININE 0.55 0.55 0.67 0.64   Estimated Creatinine Clearance: 82.3 mL/min (by C-G formula based on Cr of 0.64).  Recent Labs  07/09/14 0906  VANCOTROUGH 21.1*     Microbiology: Recent Results (from the past 720 hour(s))  Urine culture     Status: None   Collection Time: 06/15/14  2:40 AM  Result Value Ref Range Status   Specimen Description URINE, CATHETERIZED  Final   Special Requests NONE  Final   Colony Count   Final    >=100,000 COLONIES/ML Performed at [facility]    Culture   Final    ENTEROCOCCUS SPECIES Performed at [facility]    Report Status 06/19/2014 FINAL  Final   Organism ID, Bacteria ENTEROCOCCUS SPECIES  Final      Susceptibility   Enterococcus species - MIC*    AMPICILLIN <=2 SENSITIVE Sensitive     LEVOFLOXACIN 1 SENSITIVE Sensitive     NITROFURANTOIN <=16 SENSITIVE Sensitive     VANCOMYCIN 2 SENSITIVE Sensitive     TETRACYCLINE >=16 RESISTANT Resistant     *  ENTEROCOCCUS SPECIES  Clostridium Difficile by PCR     Status: None   Collection Time: 06/15/14  4:06 AM  Result Value Ref Range Status   C difficile by pcr NEGATIVE NEGATIVE Final  MRSA PCR Screening     Status: None   Collection Time: 06/15/14  2:00 PM  Result Value Ref Range Status   MRSA by PCR NEGATIVE NEGATIVE Final    Comment:        The GeneXpert MRSA Assay (FDA approved for NASAL specimens only), is one component of a comprehensive MRSA colonization surveillance program. It is not intended to diagnose MRSA infection nor to guide or monitor treatment for MRSA infections.   Culture, blood (routine x 2)     Status: None (Preliminary result)   Collection Time: 07/06/14  9:38 PM  Result Value Ref Range Status   Specimen Description BLOOD PORTA CATH DRAWN BY RN JJ  Final   Special Requests BOTTLES DRAWN AEROBIC AND ANAEROBIC 10CC EACH  Final   Culture NO GROWTH 3 DAYS  Final   Report Status PENDING  Incomplete  Culture, blood (routine x 2)     Status: None (Preliminary result)   Collection Time: 07/06/14  9:38 PM  Result Value Ref Range Status   Specimen Description BLOOD LEFT FOREARM  Final   Special Requests BOTTLES DRAWN AEROBIC AND ANAEROBIC 6CC  EACH  Final   Culture NO GROWTH 3 DAYS  Final   Report Status PENDING  Incomplete  Urine culture     Status: None   Collection Time: 07/06/14 11:08 PM  Result Value Ref Range Status   Specimen Description URINE, CATHETERIZED  Final   Special Requests NONE  Final   Colony Count NO GROWTH [facility]   Final   Culture NO GROWTH [facility] at [facility]   Final   Report Status 07/08/2014 FINAL  Final  Wound culture     Status: None (Preliminary result)   Collection Time: 07/06/14 11:09 PM  Result Value Ref Range Status   Specimen Description WOUND SACRAL DECUBITIS  Final   Special Requests NONE  Final   Gram Stain   Final    FEW WBC PRESENT, PREDOMINANTLY PMN NO SQUAMOUS EPITHELIAL CELLS  SEEN RARE GRAM POSITIVE COCCI IN PAIRS IN CLUSTERS RARE GRAM POSITIVE RODS [facility]formed at [facility]    Culture   Final    Culture reincubated for better growth [facility]formed at [facility]    Report Status PENDING  Incomplete  MRSA PCR Screening     Status: Abnormal   Collection Time: 07/06/14 11:15 PM  Result Value Ref Range Status   MRSA by PCR POSITIVE (A) NEGATIVE Final    Comment:        The GeneXpert MRSA Assay (FDA approved for NASAL specimens only), is one component of a comprehensive MRSA colonization surveillance program. It is not intended to diagnose MRSA infection nor to guide or monitor treatment for MRSA infections. RESULT CALLED TO, READ BACK BY AND VERIFIED WITH: HANDY L AT 0052 ON [id number] BY FORSYTH K    Medical History: Past Medical History  Diagnosis Date  . Heart disease   . Atrial fibrillation     WITH PACEMAKER  . OSA (obstructive sleep apnea)     does not use CPAP  . Memory loss   . Bleeding ulcer   . B12 deficiency   . Hypertension   . Abnormality of gait 05/03/2013  . Essential and other specified forms of tremor 05/03/2013  . Polyneuropathy in other diseases classified elsewhere 05/03/2013  . Peripheral edema   . Varicose veins     Bilateral  . Macular degeneration of left eye   . Bilateral hydronephrosis 06/15/2014  . Bladder calculi 06/15/2014  . Multiple rib fractures 06/15/2014   Anti-infectives    Start     Dose/Rate Route Frequency Ordered Stop   07/09/14 1800  vancomycin (VANCOCIN) IVPB 750 mg/150 ml premix     750 mg150 mL/hr over 60 Minutes Intravenous Every 12 hours 07/09/14 0955     07/07/14 1000  piperacillin-tazobactam (ZOSYN) IVPB 3.375 g     3.375 g12.5 mL/hr over 240 Minutes Intravenous Every 8 hours 07/07/14 0914     07/07/14 1000  vancomycin (VANCOCIN) IVPB 1000 mg/200 mL premix  Status:  Discontinued     1,000 mg200 mL/hr over 60 Minutes Intravenous Every 12 hours 07/07/14 0914 07/09/14 0953    07/06/14 2230  vancomycin (VANCOCIN) 1,500 mg in sodium chloride 0.9 % 500 mL IVPB     1,500 mg250 mL/hr over 120 Minutes Intravenous  Once 07/06/14 2227 07/07/14 0206   07/06/14 2230  piperacillin-tazobactam (ZOSYN) IVPB 3.375 g     3.375 g100 mL/hr over 30 Minutes Intravenous  Once 07/06/14 2227 07/07/14 0036     Assessment: HPI: Patient is an 81 year old white male nursing  home patient who has a large sacral decubitus ulcer extending to the anus. Wound care has been very difficult at the nursing home. He started developing fevers and a request has been made for a diverting colostomy. The patient was admitted by the hospitalist for stabilization.  Estimated Creatinine Clearance: 82.3 mL/min (by C-G formula based on Cr of 0.64).  Vancomycin trough above goal  Goal of Therapy:  Vancomycin trough level 10-15 mcg/ml (10-86mcg/ml would be acceptable) Eradicate infection.  Plan:  Change Vancomycin to 750 mg IV q12hrs Check trough at steady state Continue Zosyn 3.375gm IV q8h, each dose over 4 hrs Monitor labs, renal fxn, and cultures  Raquel James, Candice Lunney Bennett 07/09/2014,9:57 AM

## 2014-07-10 ENCOUNTER — Inpatient Hospital Stay (HOSPITAL_COMMUNITY): Payer: Medicare Other

## 2014-07-10 DIAGNOSIS — D638 Anemia in other chronic diseases classified elsewhere: Secondary | ICD-10-CM

## 2014-07-10 DIAGNOSIS — L89109 Pressure ulcer of unspecified part of back, unspecified stage: Secondary | ICD-10-CM

## 2014-07-10 MED ORDER — SODIUM CHLORIDE 0.9 % IV SOLN
Freq: Once | INTRAVENOUS | Status: AC
  Administered 2014-07-10: 14:00:00 via INTRAVENOUS

## 2014-07-10 MED ORDER — PEG 3350-KCL-NA BICARB-NACL 420 G PO SOLR
4000.0000 mL | Freq: Once | ORAL | Status: AC
  Administered 2014-07-10: 4000 mL via ORAL
  Filled 2014-07-10: qty 4000

## 2014-07-10 MED ORDER — CHLORHEXIDINE GLUCONATE 4 % EX LIQD
1.0000 "application " | Freq: Once | CUTANEOUS | Status: DC
  Filled 2014-07-10: qty 118

## 2014-07-10 MED ORDER — CHLORHEXIDINE GLUCONATE 4 % EX LIQD
Freq: Once | CUTANEOUS | Status: AC
  Administered 2014-07-10: 23:00:00 via TOPICAL

## 2014-07-10 NOTE — Plan of Care (Signed)
Problem: Discharge Progression Outcomes Goal: Tolerating diet Outcome: Progressing Pt has not been eating his breakfast. Pt's appetite continues to be poor. Pt receiving scheduled PRO-STAT feeding supplement. Will continue to monitor patient.

## 2014-07-10 NOTE — Progress Notes (Addendum)
PROGRESS NOTE  Jon CutterWilliam P [name] XBM:[id number] DOB: [birth year] DOA: 07/06/2014 PCP: Milana ObeyKNOWLTON,STEPHEN D, MD  Summary: 81 year old man with third hospitalization in the last 30 days who was at skilled nursing facility for rehabilitation and wound care of decubitus ulcers in the sacral area. According to chart review he was started on IV antibiotics in the last 48 hours prior to admission as an outpatient for worsening of the ulcer (according to chart cefepime and vancomycin). He was subsequently transferred to the emergency department and admitted for surgical evaluation, sacral wound infection, hyponatremia.  Assessment/Plan: 1. Large stage IV decubitus ulcer with suspected infection. Hemodynamics stable. Plan for debridement in the operating room 1/25. 2. Atrial fibrillation. Stable. Continue sotalol. History of pacemaker placement. 3. Anemia of chronic disease. Appears stable. 4. Left upper extremity edema. Left upper extremity venous ultrasound negative for DVT. 5. Hypokalemia, repleted. 6. Hyponatremia, resolved 7. History of bilateral hydronephrosis with multiple bladder calculi seen 05/2014. 8. History of rib fractures secondary to traumatic fall 05/2014. 9. History dementia, essential tremor, polyneuropathy, B12 deficiency, gait abnormality. Vitamin B-12 level was elevated.   Overall appears stable. Can likely change to oral antibiotics after debridement 1/25.  Debridement of sacral ulcer in the OR 1/25   Diverting colostomy 1/25.   Discussed with son Tawanna Coolerodd by telephone.  Code Status: full code DVT prophylaxis: heparin Family Communication:  Disposition Plan: return to SNF  Brendia Sacksaniel Carlyn Mullenbach, MD  [facility]  Pager [phone] If 7PM-7AM, please contact night-coverage at [email or web address], password [email or web address] 07/10/2014, 1:35 PM  LOS: 4 days   Consultants:  General surgery  Procedures:  1/19 (prior to admission, outpatient) PICC line placement  Antibiotics:  Vancomycin  1/20 >>  Zosyn 1/20 >>  HPI/Subjective: Poor appetite noted. Plan for surgery 1/25.  Poor appetite, eating little per nursing. Patient denies nausea. He hopes this interval, today the with him. He reports chronic aches and pains. Otherwise seems to be feeling pretty well.  Objective: Filed Vitals:   07/09/14 1300 07/09/14 1622 07/09/14 2240 07/10/14 0620  BP: 133/80 111/68 128/71 112/67  Pulse: 81 81 84 77  Temp: 97 F (36.1 C) 98.1 F (36.7 C) 98.3 F (36.8 C) 97.6 F (36.4 C)  TempSrc: Oral Oral Oral Oral  Resp: 20 18 18 17   Height:      Weight:      SpO2: 100% 100% 100% 99%    Intake/Output Summary (Last 24 hours) at 07/10/14 1335 Last data filed at 07/10/14 1002  Gross per 24 hour  Intake    613 ml  Output   1050 ml  Net   -437 ml     Filed Weights   07/06/14 1811 07/06/14 2312 07/08/14 0615  Weight: 81.647 kg (180 lb) 84.505 kg (186 lb 4.8 oz) 87.907 kg (193 lb 12.8 oz)    Exam:    Afebrile, vital signs stable. No hypoxia. General: Appears comfortable, calm. Eyes: appear grossly unremarkable ENT: Somewhat hard.. Lips appear unremarkable. Cardiovascular: Irregular, normal rate, no murmur, rub or gallop. No lower extremity edema. Respiratory: Clear to auscultation bilaterally, no wheezes, rales or rhonchi. Normal respiratory effort. Speaks in full sentences. Abdomen: soft, ntnd Skin: Chronic stasis dermatitis bilateral lower extremities. Left upper extremity edematous. Musculoskeletal: Moves both arms well, able to lift against gravity, strength 4/5. Left upper extremity edema has decreased. Bilateral lower extremities strength 3/5, symmetric. Psychiatric: grossly normal mood and affect, speech fluent and appropriate. Oriented to self, location, month, year.  Data Reviewed:  Left upper extremity venous duplex  negative.  Pertinent data:  Labs  Sodium 129 >> 132 >> 136  Hemoglobin 11.2 >> 10.1 >> 9.9  WBC 16.6 >> 13.6 >> 11.5  Wound culture multiple  organisms, non-predominant.  Urine culture no growth, final  Imaging   No evidence of left upper extremity DVT.  Other  EKG atrial fibrillation with rapid ventricular response, ST depression anterolaterally, consider ischemia. This appears to be chronic based on previous EKGs  Pending data  Blood cultures no growth thus far   Scheduled Meds: . sodium chloride   Intravenous Once  . antiseptic oral rinse  7 mL Mouth Rinse q12n4p  . chlorhexidine  1 application Topical Once  . chlorhexidine  15 mL Mouth Rinse BID  . Chlorhexidine Gluconate Cloth  6 each Topical Q0600  . feeding supplement (PRO-STAT SUGAR FREE 64)  30 mL Oral TID WC  . heparin  5,000 Units Subcutaneous 3 times per day  . mupirocin ointment  1 application Nasal BID  . pantoprazole  40 mg Oral Daily  . piperacillin-tazobactam (ZOSYN)  IV  3.375 g Intravenous Q8H  . polyethylene glycol-electrolytes  4,000 mL Oral Once  . sodium chloride  3 mL Intravenous Q12H  . sotalol  160 mg Oral BID  . vancomycin  750 mg Intravenous Q12H   Continuous Infusions:    Principal Problem:   Sacral ulcer Active Problems:   Atrial fibrillation   Polyneuropathy in other diseases classified elsewhere   Anemia of chronic disease   Decubitus ulcer   Time spent 20 minutes

## 2014-07-10 NOTE — Progress Notes (Signed)
Patient more alert this morning. Orders written for diverting colostomy formation, debridement sacral decubitus ulcer tomorrow. Risks and benefits were explained to the patient, who gave informed consent.

## 2014-07-11 ENCOUNTER — Encounter (HOSPITAL_COMMUNITY)
Admission: EM | Disposition: A | Discharge status: Skilled Nursing Facility | Payer: Self-pay | Source: Home / Self Care | Attending: Family Medicine

## 2014-07-11 ENCOUNTER — Encounter (HOSPITAL_COMMUNITY): Payer: Self-pay | Admitting: *Deleted

## 2014-07-11 ENCOUNTER — Inpatient Hospital Stay (HOSPITAL_COMMUNITY): Payer: Medicare Other | Admitting: Anesthesiology

## 2014-07-11 DIAGNOSIS — E440 Moderate protein-calorie malnutrition: Secondary | ICD-10-CM

## 2014-07-11 HISTORY — PX: INCISION AND DRAINAGE OF WOUND: SHX1803

## 2014-07-11 HISTORY — PX: COLOSTOMY: SHX63

## 2014-07-11 LAB — SURGICAL PCR SCREEN
MRSA, PCR: POSITIVE — AB
STAPHYLOCOCCUS AUREUS: POSITIVE — AB

## 2014-07-11 LAB — BASIC METABOLIC PANEL
Anion gap: 10 (ref 5–15)
BUN: 13 mg/dL (ref 6–23)
CO2: 28 mmol/L (ref 19–32)
Calcium: 8.2 mg/dL — ABNORMAL LOW (ref 8.4–10.5)
Chloride: 97 mmol/L (ref 96–112)
Creatinine, Ser: 0.64 mg/dL (ref 0.50–1.35)
GFR calc non Af Amer: >90 mL/min (ref >90)
Glucose, Bld: 116 mg/dL — ABNORMAL HIGH (ref 70–99)
Potassium: 3.6 mmol/L (ref 3.5–5.1)
Sodium: 135 mmol/L (ref 135–145)

## 2014-07-11 LAB — CBC
HCT: 32.4 % — ABNORMAL LOW (ref 39.0–52.0)
HEMOGLOBIN: 10.6 g/dL — AB (ref 13.0–17.0)
MCH: 27.7 pg (ref 26.0–34.0)
MCHC: 32.7 g/dL (ref 30.0–36.0)
MCV: 84.6 fL (ref 78.0–100.0)
Platelets: 328 10*3/uL (ref 150–400)
RBC: 3.83 MIL/uL — AB (ref 4.22–5.81)
RDW: 15.3 % (ref 11.5–15.5)
WBC: 12.9 10*3/uL — ABNORMAL HIGH (ref 4.0–10.5)

## 2014-07-11 LAB — CULTURE, BLOOD (ROUTINE X 2)
Culture: NO GROWTH
Culture: NO GROWTH

## 2014-07-11 LAB — TYPE AND SCREEN
ABO/RH(D): A POS
Antibody Screen: NEGATIVE

## 2014-07-11 SURGERY — CREATION, COLOSTOMY
Anesthesia: General

## 2014-07-11 MED ORDER — ONDANSETRON HCL 4 MG/2ML IJ SOLN: 4.0000 mg | Freq: Four times a day (QID) | INTRAMUSCULAR | Status: DC | PRN

## 2014-07-11 MED ORDER — KETOROLAC TROMETHAMINE 30 MG/ML IJ SOLN
INTRAMUSCULAR | Status: AC
  Filled 2014-07-11: qty 1

## 2014-07-11 MED ORDER — MORPHINE SULFATE 2 MG/ML IJ SOLN
2.0000 mg | INTRAMUSCULAR | Status: DC | PRN
  Administered 2014-07-11 – 2014-07-12 (×2): 2 mg via INTRAVENOUS
  Filled 2014-07-11 (×2): qty 1

## 2014-07-11 MED ORDER — FENTANYL CITRATE 0.05 MG/ML IJ SOLN
INTRAMUSCULAR | Status: AC
  Filled 2014-07-11: qty 2

## 2014-07-11 MED ORDER — MIDAZOLAM HCL 2 MG/2ML IJ SOLN
1.0000 mg | INTRAMUSCULAR | Status: DC | PRN
  Administered 2014-07-11: 1 mg via INTRAVENOUS

## 2014-07-11 MED ORDER — SUCCINYLCHOLINE CHLORIDE 20 MG/ML IJ SOLN
INTRAMUSCULAR | Status: AC
  Filled 2014-07-11: qty 1

## 2014-07-11 MED ORDER — EPHEDRINE SULFATE 50 MG/ML IJ SOLN
INTRAMUSCULAR | Status: DC | PRN
  Administered 2014-07-11: 10 mg via INTRAVENOUS

## 2014-07-11 MED ORDER — FENTANYL CITRATE 0.05 MG/ML IJ SOLN: 25.0000 ug | INTRAMUSCULAR | Status: DC | PRN

## 2014-07-11 MED ORDER — NEOSTIGMINE METHYLSULFATE 10 MG/10ML IV SOLN
INTRAVENOUS | Status: DC | PRN
  Administered 2014-07-11: 1 mg via INTRAVENOUS
  Administered 2014-07-11: 2 mg via INTRAVENOUS

## 2014-07-11 MED ORDER — MIDAZOLAM HCL 5 MG/5ML IJ SOLN
INTRAMUSCULAR | Status: DC | PRN
  Administered 2014-07-11: 1 mg via INTRAVENOUS

## 2014-07-11 MED ORDER — NEOSTIGMINE METHYLSULFATE 10 MG/10ML IV SOLN
INTRAVENOUS | Status: AC
  Filled 2014-07-11: qty 1

## 2014-07-11 MED ORDER — SODIUM CHLORIDE 0.9 % IV SOLN
INTRAVENOUS | Status: AC
  Administered 2014-07-11: 17:00:00 via INTRAVENOUS

## 2014-07-11 MED ORDER — POVIDONE-IODINE 10 % EX OINT
TOPICAL_OINTMENT | CUTANEOUS | Status: AC
  Filled 2014-07-11: qty 1

## 2014-07-11 MED ORDER — FENTANYL CITRATE 0.05 MG/ML IJ SOLN
INTRAMUSCULAR | Status: DC | PRN
  Administered 2014-07-11 (×2): 50 ug via INTRAVENOUS
  Administered 2014-07-11: 100 ug via INTRAVENOUS
  Administered 2014-07-11: 50 ug via INTRAVENOUS

## 2014-07-11 MED ORDER — ADULT MULTIVITAMIN W/MINERALS CH
1.0000 | ORAL_TABLET | Freq: Every day | ORAL | Status: DC
  Administered 2014-07-11 – 2014-07-12 (×2): 1 via ORAL
  Filled 2014-07-11 (×2): qty 1

## 2014-07-11 MED ORDER — MIDAZOLAM HCL 2 MG/2ML IJ SOLN
INTRAMUSCULAR | Status: AC
  Filled 2014-07-11: qty 2

## 2014-07-11 MED ORDER — ROCURONIUM BROMIDE 100 MG/10ML IV SOLN
INTRAVENOUS | Status: DC | PRN
  Administered 2014-07-11: 20 mg via INTRAVENOUS

## 2014-07-11 MED ORDER — ONDANSETRON HCL 4 MG/2ML IJ SOLN
INTRAMUSCULAR | Status: AC
  Filled 2014-07-11: qty 2

## 2014-07-11 MED ORDER — ROCURONIUM BROMIDE 50 MG/5ML IV SOLN
INTRAVENOUS | Status: AC
  Filled 2014-07-11: qty 1

## 2014-07-11 MED ORDER — KETOROLAC TROMETHAMINE 30 MG/ML IJ SOLN
30.0000 mg | Freq: Once | INTRAMUSCULAR | Status: AC
Start: 2014-07-11 — End: 2014-07-11
  Administered 2014-07-11: 30 mg via INTRAVENOUS

## 2014-07-11 MED ORDER — ETOMIDATE 2 MG/ML IV SOLN
INTRAVENOUS | Status: DC | PRN
  Administered 2014-07-11: 10 mg via INTRAVENOUS

## 2014-07-11 MED ORDER — FENTANYL CITRATE 0.05 MG/ML IJ SOLN
25.0000 ug | INTRAMUSCULAR | Status: AC
  Administered 2014-07-11 (×2): 25 ug via INTRAVENOUS

## 2014-07-11 MED ORDER — ETOMIDATE 2 MG/ML IV SOLN
INTRAVENOUS | Status: AC
  Filled 2014-07-11: qty 10

## 2014-07-11 MED ORDER — BOOST / RESOURCE BREEZE PO LIQD
1.0000 | Freq: Three times a day (TID) | ORAL | Status: DC
  Administered 2014-07-11 – 2014-07-12 (×4): 1 via ORAL

## 2014-07-11 MED ORDER — GLYCOPYRROLATE 0.2 MG/ML IJ SOLN
INTRAMUSCULAR | Status: DC | PRN
  Administered 2014-07-11: 0.4 mg via INTRAVENOUS

## 2014-07-11 MED ORDER — FENTANYL CITRATE 0.05 MG/ML IJ SOLN
INTRAMUSCULAR | Status: AC
  Filled 2014-07-11: qty 5

## 2014-07-11 MED ORDER — ONDANSETRON HCL 4 MG PO TABS: 4.0000 mg | ORAL_TABLET | Freq: Four times a day (QID) | ORAL | Status: DC | PRN

## 2014-07-11 MED ORDER — GLYCOPYRROLATE 0.2 MG/ML IJ SOLN
INTRAMUSCULAR | Status: AC
  Filled 2014-07-11: qty 2

## 2014-07-11 MED ORDER — 0.9 % SODIUM CHLORIDE (POUR BTL) OPTIME
TOPICAL | Status: DC | PRN
  Administered 2014-07-11 (×2): 1000 mL

## 2014-07-11 MED ORDER — ONDANSETRON HCL 4 MG/2ML IJ SOLN: 4.0000 mg | Freq: Once | INTRAMUSCULAR | Status: DC | PRN

## 2014-07-11 MED ORDER — LACTATED RINGERS IV SOLN
INTRAVENOUS | Status: DC
  Administered 2014-07-11 (×2): via INTRAVENOUS

## 2014-07-11 MED ORDER — SUCCINYLCHOLINE CHLORIDE 20 MG/ML IJ SOLN
INTRAMUSCULAR | Status: DC | PRN
  Administered 2014-07-11: 120 mg via INTRAVENOUS

## 2014-07-11 MED ORDER — ONDANSETRON HCL 4 MG/2ML IJ SOLN
4.0000 mg | Freq: Once | INTRAMUSCULAR | Status: AC
  Administered 2014-07-11: 4 mg via INTRAVENOUS

## 2014-07-11 SURGICAL SUPPLY — 68 items
APPLIER CLIP 11 MED OPEN (CLIP)
APPLIER CLIP 13 LRG OPEN (CLIP)
APR CLP MED 11 20 MLT OPN (CLIP)
BAG HAMPER (MISCELLANEOUS) ×3 IMPLANT
BANDAGE CONFORM 4  STR 12 LF (GAUZE/BANDAGES/DRESSINGS) ×3 IMPLANT
BARRIER SKIN 2 3/4 (OSTOMY) ×2 IMPLANT
BARRIER SKIN 2 3/4 INCH (OSTOMY) ×1
CELLS DAT CNTRL 66122 CELL SVR (MISCELLANEOUS) IMPLANT
CHLORAPREP W/TINT 26ML (MISCELLANEOUS) ×3 IMPLANT
CLAMP POUCH DRAINAGE QUIET (OSTOMY) IMPLANT
CLIP APPLIE 11 MED OPEN (CLIP) IMPLANT
CLIP APPLIE 13 LRG OPEN (CLIP) IMPLANT
CLOTH BEACON ORANGE TIMEOUT ST (SAFETY) ×3 IMPLANT
COVER LIGHT HANDLE STERIS (MISCELLANEOUS) ×6 IMPLANT
DRAPE WARM FLUID 44X44 (DRAPE) IMPLANT
DRSG OPSITE POSTOP 4X8 (GAUZE/BANDAGES/DRESSINGS) ×3 IMPLANT
DURAPREP 26ML APPLICATOR (WOUND CARE) ×3 IMPLANT
ELECT BLADE 6 FLAT ULTRCLN (ELECTRODE) IMPLANT
ELECT REM PT RETURN 9FT ADLT (ELECTROSURGICAL) ×3
ELECTRODE REM PT RTRN 9FT ADLT (ELECTROSURGICAL) ×1 IMPLANT
GAUZE SPONGE 4X4 12PLY STRL (GAUZE/BANDAGES/DRESSINGS) ×3 IMPLANT
GLOVE BIO SURGEON STRL SZ 6.5 (GLOVE) ×2 IMPLANT
GLOVE BIO SURGEONS STRL SZ 6.5 (GLOVE) ×1
GLOVE BIOGEL PI IND STRL 7.0 (GLOVE) ×3 IMPLANT
GLOVE BIOGEL PI IND STRL 7.5 (GLOVE) ×1 IMPLANT
GLOVE BIOGEL PI INDICATOR 7.0 (GLOVE) ×6
GLOVE BIOGEL PI INDICATOR 7.5 (GLOVE) ×2
GLOVE ECLIPSE 7.0 STRL STRAW (GLOVE) ×3 IMPLANT
GLOVE SURG SS PI 7.5 STRL IVOR (GLOVE) ×9 IMPLANT
GOWN STRL REUS W/TWL LRG LVL3 (GOWN DISPOSABLE) ×9 IMPLANT
HARMONIC SHEARS 14CM COAG (MISCELLANEOUS) IMPLANT
INST SET MAJOR GENERAL (KITS) ×3 IMPLANT
KIT ROOM TURNOVER APOR (KITS) ×3 IMPLANT
LIGASURE IMPACT 36 18CM CVD LR (INSTRUMENTS) ×3 IMPLANT
MANIFOLD NEPTUNE II (INSTRUMENTS) ×3 IMPLANT
NS IRRIG 1000ML POUR BTL (IV SOLUTION) ×6 IMPLANT
PACK ABDOMINAL MAJOR (CUSTOM PROCEDURE TRAY) ×3 IMPLANT
PACK MINOR (CUSTOM PROCEDURE TRAY) ×3 IMPLANT
PAD ABD 5X9 TENDERSORB (GAUZE/BANDAGES/DRESSINGS) ×3 IMPLANT
PAD ARMBOARD 7.5X6 YLW CONV (MISCELLANEOUS) ×3 IMPLANT
POUCH OSTOMY 2 3/4  H 3804 (WOUND CARE) ×2
POUCH OSTOMY 2 3/4 H 3804 (WOUND CARE) ×1
POUCH OSTOMY 2 PC DRNBL 2.75 (WOUND CARE) ×1 IMPLANT
RELOAD LINEAR CUT PROX 55 BLUE (ENDOMECHANICALS) IMPLANT
RELOAD PROXIMATE 75MM BLUE (ENDOMECHANICALS) IMPLANT
RETAINER VISCERA MED (MISCELLANEOUS) IMPLANT
RETRACTOR WND ALEXIS 25 LRG (MISCELLANEOUS) IMPLANT
RETRACTOR WOUND ALXS 34CM XLRG (MISCELLANEOUS) IMPLANT
RTRCTR WOUND ALEXIS 18CM MED (MISCELLANEOUS)
RTRCTR WOUND ALEXIS 25CM LRG (MISCELLANEOUS)
RTRCTR WOUND ALEXIS 34CM XLRG (MISCELLANEOUS)
SET BASIN LINEN APH (SET/KITS/TRAYS/PACK) ×3 IMPLANT
SHEARS HARMONIC 23CM COAG (MISCELLANEOUS) IMPLANT
SPONGE LAP 18X18 X RAY DECT (DISPOSABLE) IMPLANT
STAPLER GUN LINEAR PROX 60 (STAPLE) IMPLANT
STAPLER PROXIMATE 55 BLUE (STAPLE) IMPLANT
STAPLER PROXIMATE 75MM BLUE (STAPLE) ×3 IMPLANT
SUCTION POOLE TIP (SUCTIONS) ×3 IMPLANT
SUT CHROMIC 0 SH (SUTURE) ×6 IMPLANT
SUT CHROMIC 3 0 PS 2 (SUTURE) ×9 IMPLANT
SUT NOVA NAB GS-26 0 60 (SUTURE) IMPLANT
SUT PDS AB 0 CTX 60 (SUTURE) ×9 IMPLANT
SUT SILK 2 0 (SUTURE) ×2
SUT SILK 2 0 REEL (SUTURE) IMPLANT
SUT SILK 2-0 18XBRD TIE 12 (SUTURE) ×1 IMPLANT
SUT SILK 3 0 SH CR/8 (SUTURE) ×3 IMPLANT
TAPE CLOTH SURG 4X10 WHT LF (GAUZE/BANDAGES/DRESSINGS) ×3 IMPLANT
TRAY FOLEY CATH 16FR SILVER (SET/KITS/TRAYS/PACK) ×3 IMPLANT

## 2014-07-11 NOTE — Progress Notes (Signed)
INITIAL NUTRITION ASSESSMENT  DOCUMENTATION CODES Per approved criteria  -Non-severe (moderate) malnutrition in the context of acute illness or injury  Pt meets criteria for non-severe (moderate) malnutrition in context of acute injury as evidenced by <75% energy intake for >7 days and mild body fat depletion.   INTERVENTION: Provide Resource Breeze TID Continue Pro-Stat TID Provide MVI  NUTRITION DIAGNOSIS: Inadequate oral intake related to poor appetite as evidenced by <25% PO intake.   Goal: Pt to meet >/= 90% of estimated energy requirements.   Monitor:  PO intake, weight, labs, wound  Reason for Assessment: Consult for assessment of nutrition requirements/status  81 y.o. male  Admitting Dx: Sacral ulcer  ASSESSMENT: 81 y/o male with third hospitalization in the last 30 days. Pt lives at skilled nursing facility for rehabilitation and wound care of decubitus ulcers in the sacral area. Pt hx shows he was started on IV antibiotics in the last 48 hours prior to admission as an outpatient for worsening of the ulcer. 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 Hx of dementia, essential tremor, polyneuropathy, B12 deficiency and gait abnormality. Per nursing, pt has poor appetite and is eating little. Pt denies nausea, but reports chronic aches and pains.  Debridement of sacral ulcer in the OR 1/25 Diverting colostomy 1/25 Oral antibiotics after debridement 1/25.  Currently receiving Pro-Stat TID  Pt was confused d/t post-op status upon DI assessment. Son was at bedside, and confirmed wt hx. Prior to infection, pt was cooking and eating well. Son stated he was eating well throughout the holidays. Pt was attempting to eat better, and lose weight. Since admission, pt has not been eating well. Son reported pt had decreased appetite, related to infection. Per nurse, he has been eating  25% meals. Unable to obtain detailed dietary recall.  Immunologistrovide Resource Breeze for additional calories and protein  Provide MVI  Nutrition Focused Physical Exam:  Subcutaneous Fat:  Orbital Region: WDL Upper Arm Region: mild depletion Thoracic and Lumbar Region: mild depletion  Muscle:  Temple Region: mild depletion Clavicle Bone Region: mild depletion Clavicle and Acromion Bone Region: mild depletion Scapular Bone Region: unable to assess Dorsal Hand: WDL Patellar Region: unable to assess Anterior Thigh Region: unable to assess Posterior Calf Region: unable to assess  Edema: not present    Height: Ht Readings from Last 1 Encounters:  07/06/14 5\' 10"  (1.778 m)    Weight: Wt Readings from Last 1 Encounters:  07/08/14 193 lb 12.8 oz (87.907 kg)    Ideal Body Weight: 166 lb (75.5 kg)  % Ideal Body Weight: 116%  Wt Readings from Last 10 Encounters:  07/08/14 193 lb 12.8 oz (87.907 kg)  06/16/14 189 lb 9.5 oz (86 kg)  06/09/14 180 lb (81.647 kg)  08/26/13 186 lb 6.4 oz (84.55 kg)  05/03/13 191 lb (86.637 kg)  08/13/12 201 lb (91.173 kg)    Usual Body Weight: 200 lb (90 kg)  % Usual Body Weight: 96%  BMI:  Body mass index is 27.81 kg/(m^2).  Estimated Nutritional Needs: Kcal: 2400-2600 Protein: 120-140 grams Fluid: 2 L/day   Skin: stage IV decubitus sacral ulcer  Diet Order: Diet NPO time specified Diet NPO time specified  EDUCATION NEEDS: -No education needs identified at this time   Intake/Output Summary (Last 24 hours) at 07/11/14 0945 Last data filed at 07/11/14 0939  Gross per 24 hour  Intake   1723 ml  Output  4300 ml  Net  -2577 ml    Last BM: 1/25    Labs:   Recent Labs Lab 07/08/14 0557 07/09/14 0537 07/11/14 0628  NA 132* 136 135  K 3.3* 3.7 3.6  CL 97 103 97  CO2 BUN CREATININE 0.67 0.64 0.64  CALCIUM 7.7* 7.8* 8.2*  GLUCOSE 104* 84 116*    CBG (last 3)  No results for input(s): GLUCAP in the  last 72 hours.  Scheduled Meds: . [facility] Hold] antiseptic oral rinse  7 mL Mouth Rinse q12n4p  . [MAR Hold] chlorhexidine  15 mL Mouth Rinse BID  . [MAR Hold] feeding supplement (PRO-STAT SUGAR FREE 64)  30 mL Oral TID WC  . [MAR Hold] heparin  5,000 Units Subcutaneous 3 times per day  . [MAR Hold] mupirocin ointment  1 application Nasal BID  . [MAR Hold] pantoprazole  40 mg Oral Daily  . [MAR Hold] piperacillin-tazobactam (ZOSYN)  IV  3.375 g Intravenous Q8H  . [MAR Hold] sodium chloride  3 mL Intravenous Q12H  . [MAR Hold] sotalol  160 mg Oral BID  . [MAR Hold] vancomycin  750 mg Intravenous Q12H    Continuous Infusions: . lactated ringers 75 mL/hr at 07/11/14 8119    Past Medical History  Diagnosis Date  . Heart disease   . Atrial fibrillation     WITH PACEMAKER  . OSA (obstructive sleep apnea)     does not use CPAP  . Memory loss   . Bleeding ulcer   . B12 deficiency   . Hypertension   . Abnormality of gait 05/03/2013  . Essential and other specified forms of tremor 05/03/2013  . Polyneuropathy in other diseases classified elsewhere 05/03/2013  . Peripheral edema   . Varicose veins     Bilateral  . Macular degeneration of left eye   . Bilateral hydronephrosis 06/15/2014  . Bladder calculi 06/15/2014  . Multiple rib fractures 06/15/2014    Past Surgical History  Procedure Laterality Date  . Pacemaker insertion  2006  . Meningioma removal  1992    POST RT FRONTAL   . Cataract extraction Bilateral 2013  . Permanent pacemaker generator change N/A 08/13/2012    Procedure: PERMANENT PACEMAKER GENERATOR CHANGE;  Surgeon: Thurmon Fair, MD;  Location: [facility] CATH LAB;  Service: Cardiovascular;  Laterality: N/A;    Cristela Felt, MS Dietetic Intern Pager: [phone]

## 2014-07-11 NOTE — Transfer of Care (Signed)
Immediate Anesthesia Transfer of Care Note  Patient: Jon CutterWilliam P [name]  Procedure(s) Performed: Procedure(s): DIVERTING COLOSTOMY (N/A) DEBRIDEMENT OF SACRUM (N/A)  Patient Location: PACU  Anesthesia Type:General  Level of Consciousness: awake, alert  and patient cooperative  Airway & Oxygen Therapy: Patient Spontanous Breathing and Patient connected to face mask oxygen  Post-op Assessment: Report given to PACU RN and Post -op Vital signs reviewed and stable  Post vital signs: Reviewed and stable  Complications: No apparent anesthesia complications

## 2014-07-11 NOTE — Op Note (Signed)
Patient:  Jon [name]  DOB:  01-06-1932  MRN:  [id number]   Preop Diagnosis:  Sacral decubitus ulcer  Postop Diagnosis:  Same  Procedure:  Diverting colostomy, debridement of sacral decubitus ulcer  Surgeon:  Franky MachoMark Manna Gose, M.D.  Anes:  Gen. endotracheal  Indications:  Patient is an 81 year old white male who presented from the nursing home with a large sacral decubitus ulcer which was necrotic in nature, extending to the anal verge. Given its location, the nursing home has been unable to keep the sacral decubitus ulcer clean due to stooling. The patient now comes to the operating room for a diverting colostomy and debridement of the sacral decubitus ulcer. The risks and benefits of the procedure were fully explained to the patient and family, who gave informed consent.  Procedure note:  The patient was placed the supine position. After induction of general endotracheal anesthesia, the abdomen was prepped and draped using usual sterile technique with ChloraPrep. Surgical site confirmation was performed.  A lower midline incision was made from the umbilicus to the suprapubic region. The peritoneal cavity was entered into without difficulty. A loop of the sigmoid colon was found. The sigmoid colon was divided along its mid portion using a GIA-75 stapler. The mesentery was then divided using the LigaSure. The colostomy was then formed along the left side of the abdomen at the level of the umbilicus. The proximal colon was then brought out through this ostomy site. 3-0 silk sutures were used to secure the serosa to the fascia. The fascia was reapproximated using a looped 0 PDS running suture. The subcutaneous layer was irrigated normal saline and the skin was closed using staples. Betadine ointment and a dry sterile dressing was applied.  The colostomy was matured using 3-0 chromic gut interrupted sutures. An ostomy bag was then applied.  Next, I changed my gown and gloves and proceeded with  an excisional debridement of the sacral decubitus ulcer while the patient was in a right lateral decubitus position. Necrotic tissue down to the pelvic bone was debrided sharply using scissors. The wound was then packed with saline soaked gauze. A dry sterile dressing was then applied.  All tape and needle counts were correct at the end of the procedure. The patient was extubated in the operating room and transferred to PACU in stable condition.  Complications:  None  EBL:  50 mL  Specimen:  None

## 2014-07-11 NOTE — Anesthesia Preprocedure Evaluation (Addendum)
Anesthesia Evaluation  Patient identified by MRN, date of birth, ID band Patient confused    Reviewed: Allergy & Precautions, NPO status , Patient's Chart, lab work & pertinent test results  Airway Mallampati: III  TM Distance: >3 FB     Dental  (+) Poor Dentition, Loose, Dental Advisory Given   Pulmonary sleep apnea , former smoker,  breath sounds clear to auscultation        Cardiovascular hypertension, Pt. on medications + CAD and + Peripheral Vascular Disease + dysrhythmias Atrial Fibrillation + pacemaker Rhythm:Irregular Rate:Normal     Neuro/Psych PSYCHIATRIC DISORDERS (ddementia, confusion - sepsis)  Neuromuscular disease    GI/Hepatic PUD,   Endo/Other    Renal/GU Renal InsufficiencyRenal disease     Musculoskeletal   Abdominal   Peds  Hematology  (+) anemia ,   Anesthesia Other Findings Sepsis from sacral decubitus ulcer LUE edema , IV?  Reproductive/Obstetrics                            Anesthesia Physical Anesthesia Plan  ASA: IV  Anesthesia Plan: General   Post-op Pain Management:    Induction: Intravenous  Airway Management Planned: Oral ETT  Additional Equipment:   Intra-op Plan:   Post-operative Plan: Extubation in OR  Informed Consent: I have reviewed the patients History and Physical, chart, labs and discussed the procedure including the risks, benefits and alternatives for the proposed anesthesia with the patient or authorized representative who has indicated his/her understanding and acceptance.     Plan Discussed with:   Anesthesia Plan Comments: (amidate induction)        Anesthesia Quick Evaluation

## 2014-07-11 NOTE — Progress Notes (Signed)
Very poor hygiene from floor, dental, nasal, not shaven and body. Bilateral nares cleared of formed drainage. Mouth care done. vaseline to lips. Facial shave done. Eyes and face cleansed with warm water. Tolerated well. States "I feel better now."

## 2014-07-11 NOTE — Anesthesia Postprocedure Evaluation (Signed)
  Anesthesia Post-op Note  Patient: Jon CutterWilliam P [name]  Procedure(s) Performed: Procedure(s) with comments: DIVERTING COLOSTOMY (N/A) - wound class: clean contaminated DEBRIDEMENT OF SACRUM (N/A)  Patient Location: PACU  Anesthesia Type:General  Level of Consciousness: awake, alert  and patient cooperative  Airway and Oxygen Therapy: Patient Spontanous Breathing  Post-op Pain: mild  Post-op Assessment: Post-op Vital signs reviewed, Patient's Cardiovascular Status Stable, Respiratory Function Stable and Patent Airway  Post-op Vital Signs: Reviewed and stable  Last Vitals:  Filed Vitals:   07/11/14 1100  BP: 137/89  Pulse: 102  Temp:   Resp: 18    Complications: No apparent anesthesia complications

## 2014-07-11 NOTE — Anesthesia Procedure Notes (Signed)
Procedure Name: Intubation Date/Time: 07/11/2014 8:51 AM Performed by: Despina HiddenIDACAVAGE, Zaydin Billey J Pre-anesthesia Checklist: Patient being monitored, Suction available, Emergency Drugs available and Patient identified Patient Re-evaluated:Patient Re-evaluated prior to inductionOxygen Delivery Method: Circle system utilized Preoxygenation: Pre-oxygenation with 100% oxygen Intubation Type: IV induction, Rapid sequence and Cricoid Pressure applied Ventilation: Mask ventilation without difficulty Grade View: Grade III Tube type: Oral Tube size: 8.0 mm Number of attempts: 1 Airway Equipment and Method: Video-laryngoscopy and Rigid stylet Placement Confirmation: ETT inserted through vocal cords under direct vision,  breath sounds checked- equal and bilateral and positive ETCO2 Secured at: 22 cm Tube secured with: Tape Dental Injury: Teeth and Oropharynx as per pre-operative assessment  Difficulty Due To: Difficulty was anticipated, Difficult Airway- due to reduced neck mobility and Difficult Airway- due to dentition Future Recommendations: Recommend- induction with short-acting agent, and alternative techniques readily available

## 2014-07-11 NOTE — Progress Notes (Signed)
PROGRESS NOTE  Jon [name] ZOX:[id number] DOB: [birth year] DOA: 07/06/2014 PCP: Milana Obey, MD  Summary: 81 year old man with third hospitalization in the last 30 days who was at skilled nursing facility for rehabilitation and wound care of decubitus ulcers in the sacral area. According to chart review he was started on IV antibiotics in the last 48 hours prior to admission as an outpatient for worsening of the ulcer (according to chart cefepime and vancomycin). He was subsequently transferred to the emergency department and admitted for surgical evaluation, sacral wound infection, hyponatremia.  Assessment/Plan: 1. Large stage IV decubitus ulcer with suspected infection. Status post debridement in the OR and diverting colostomy 1/25. 2. Atrial fibrillation. Remains stable. Continue sotalol. History of pacemaker placement. 3. Anemia of chronic disease. Stable. 4. Left upper extremity edema. Left upper extremity venous ultrasound negative for DVT. 5. Hypokalemia, repleted. 6. Hyponatremia, resolved 7. History of bilateral hydronephrosis with multiple bladder calculi seen 05/2014. 8. History of rib fractures secondary to traumatic fall 05/2014. 9. History dementia, essential tremor, polyneuropathy, B12 deficiency, gait abnormality. Vitamin B-12 level was elevated. 10. Moderate malnutrition   Discussed with Dr. Lovell Sheehan. Surgery went well. Plan to continue antibiotics. Can be discharged likely 1/26 back to facility with outpatient wound care and antibiotics.  Code Status: full code DVT prophylaxis: heparin Family Communication:  Disposition Plan: return to SNF  Brendia Sacks, MD  [facility]  Pager [phone] If 7PM-7AM, please contact night-coverage at [email or web address], password [facility] 07/11/2014, 11:47 AM  LOS: 5 days   Consultants:  General surgery  Procedures:  1/19 (prior to admission, outpatient) PICC line placement  1/25 diverting colostomy, debridement of  sacral decubitus ulcer  Antibiotics:  Vancomycin 1/20 >> 1/26  Zosyn 1/20 >>  HPI/Subjective: Feels good. No complaints.  Objective: Filed Vitals:   07/11/14 0830 07/11/14 1030 07/11/14 1045 07/11/14 1100  BP: 114/73 138/96 136/84 137/89  Pulse:  96 101 102  Temp:  98.6 F (37 C)    TempSrc:      Resp: Height:      Weight:      SpO2: 98% 99% 92% 99%    Intake/Output Summary (Last 24 hours) at 07/11/14 1147 Last data filed at 07/11/14 1025  Gross per 24 hour  Intake   1560 ml  Output   3400 ml  Net  -1840 ml     Filed Weights   07/06/14 1811 07/06/14 2312 07/08/14 0615  Weight: 81.647 kg (180 lb) 84.505 kg (186 lb 4.8 oz) 87.907 kg (193 lb 12.8 oz)    Exam:    Afebrile, vital signs stable.  General:  Appears calm and comfortable Cardiovascular: RRR, no m/r/g. No LE edema. Respiratory: CTA bilaterally, no w/r/r. Normal respiratory effort. Musculoskeletal: grossly normal tone BUE/BLE, able to move all extremities Psychiatric: grossly normal mood and affect, speech fluent and appropriate  Data Reviewed:  Urine output 3800, fair oral intake  Basic metabolic panel unremarkable   Hemoglobin stable, 10.6.  Pertinent data:  Labs  Sodium 129 >> 132 >> 135  Hemoglobin 11.2 >> 10.1 >> 10.6  WBC 16.6 >> 13.6 >> 12.9  Wound culture multiple organisms, non-predominant.  Urine culture no growth, final  Imaging   No evidence of left upper extremity DVT.  Other  EKG atrial fibrillation with rapid ventricular response, ST depression anterolaterally, consider ischemia. This appears to be chronic based on previous EKGs  Pending data  Blood cultures no growth thus far   Scheduled Meds: .  antiseptic oral rinse  7 mL Mouth Rinse q12n4p  . chlorhexidine  15 mL Mouth Rinse BID  . feeding supplement (PRO-STAT SUGAR FREE 64)  30 mL Oral TID WC  . heparin  5,000 Units Subcutaneous 3 times per day  . [MAR Hold] mupirocin ointment  1 application  Nasal BID  . pantoprazole  40 mg Oral Daily  . piperacillin-tazobactam (ZOSYN)  IV  3.375 g Intravenous Q8H  . sodium chloride  3 mL Intravenous Q12H  . sotalol  160 mg Oral BID  . vancomycin  750 mg Intravenous Q12H   Continuous Infusions:    Principal Problem:   Sacral ulcer Active Problems:   Atrial fibrillation   Polyneuropathy in other diseases classified elsewhere   Anemia of chronic disease   Decubitus ulcer   Time spent 20 minutes

## 2014-07-12 ENCOUNTER — Encounter (HOSPITAL_COMMUNITY): Payer: Self-pay | Admitting: General Surgery

## 2014-07-12 DIAGNOSIS — I480 Paroxysmal atrial fibrillation: Secondary | ICD-10-CM

## 2014-07-12 LAB — BASIC METABOLIC PANEL
Anion gap: 7 (ref 5–15)
BUN: 16 mg/dL (ref 6–23)
CO2: 29 mmol/L (ref 19–32)
Calcium: 7.6 mg/dL — ABNORMAL LOW (ref 8.4–10.5)
Chloride: 101 mmol/L (ref 96–112)
Creatinine, Ser: 0.85 mg/dL (ref 0.50–1.35)
GFR calc Af Amer: >90 mL/min (ref >90)
GFR calc non Af Amer: 80 mL/min — ABNORMAL LOW (ref >90)
GLUCOSE: 133 mg/dL — AB (ref 70–99)
Potassium: 3.4 mmol/L — ABNORMAL LOW (ref 3.5–5.1)
Sodium: 137 mmol/L (ref 135–145)

## 2014-07-12 LAB — CBC
HCT: 31.2 % — ABNORMAL LOW (ref 39.0–52.0)
HEMOGLOBIN: 9.7 g/dL — AB (ref 13.0–17.0)
MCH: 26.9 pg (ref 26.0–34.0)
MCHC: 31.1 g/dL (ref 30.0–36.0)
MCV: 86.4 fL (ref 78.0–100.0)
Platelets: 289 10*3/uL (ref 150–400)
RBC: 3.61 MIL/uL — AB (ref 4.22–5.81)
RDW: 15.3 % (ref 11.5–15.5)
WBC: 11.1 10*3/uL — ABNORMAL HIGH (ref 4.0–10.5)

## 2014-07-12 LAB — PHOSPHORUS: Phosphorus: 3.6 mg/dL (ref 2.3–4.6)

## 2014-07-12 LAB — MAGNESIUM: MAGNESIUM: 1.5 mg/dL (ref 1.5–2.5)

## 2014-07-12 MED ORDER — VANCOMYCIN HCL IN DEXTROSE 750-5 MG/150ML-% IV SOLN
750.0000 mg | Freq: Two times a day (BID) | INTRAVENOUS | Status: AC
Start: 2014-07-12 — End: 2014-07-26

## 2014-07-12 MED ORDER — DAKINS (1/4 STRENGTH) 0.125 % EX SOLN
Freq: Two times a day (BID) | CUTANEOUS | Status: DC
  Administered 2014-07-12: 15:00:00
  Filled 2014-07-12: qty 473

## 2014-07-12 MED ORDER — HYDROCODONE-ACETAMINOPHEN 5-325 MG PO TABS: 1.0000 | ORAL_TABLET | Freq: Four times a day (QID) | ORAL | Status: DC | PRN

## 2014-07-12 MED ORDER — PIPERACILLIN-TAZOBACTAM 3.375 G IVPB: 3.3750 g | Freq: Three times a day (TID) | INTRAVENOUS | Status: AC

## 2014-07-12 NOTE — Progress Notes (Signed)
Completed dressing change. Pt tolerated well. Given morphine before dressing change.

## 2014-07-12 NOTE — Progress Notes (Signed)
1 Day Post-Op  Subjective: Patient feels well. Is alert.  Objective: Vital signs in last 24 hours: Temp:  [97.4 F (36.3 C)-98.6 F (37 C)] 97.8 F (36.6 C) (01/26 0500) Pulse Rate:  [58-102] 82 (01/26 0500) Resp:  [13-21] 18 (01/26 0500) BP: (102-141)/(63-96) 109/76 mmHg (01/26 0500) SpO2:  [92 %-100 %] 100 % (01/26 0500) Last BM Date: 07/11/14  Intake/Output from previous day: 01/25 0701 - 01/26 0700 In: 2140 [P.O.:240; I.V.:1700; IV Piggyback:200] Out: 1250 [Urine:1200; Blood:50] Intake/Output this shift:    General appearance: alert, cooperative and no distress GI: Soft. Incision healing well. Ostomy pink and patent. Gas is noted within the ostomy bag.  Lab Results:   Recent Labs  07/11/14 0628 07/12/14 0544  WBC 12.9* 11.1*  HGB 10.6* 9.7*  HCT 32.4* 31.2*  PLT 328 289   BMET  Recent Labs  07/11/14 0628 07/12/14 0544  NA 135 137  K 3.6 3.4*  CL 97 101  CO2 28 29  GLUCOSE 116* 133*  BUN 13 16  CREATININE 0.64 0.85  CALCIUM 8.2* 7.6*   PT/INR No results for input(s): LABPROT, INR in the last 72 hours.  Studies/Results: [place] Venous Img Upper Uni Left  07/10/2014   CLINICAL DATA:  Left upper extremity edema. History of left subclavian pacemaker placement prior right upper extremity PICC line.  EXAM: LEFT UPPER EXTREMITY VENOUS DOPPLER ULTRASOUND  TECHNIQUE: Gray-scale sonography with graded compression, as well as color Doppler and duplex ultrasound were performed to evaluate the upper extremity deep venous system from the level of the subclavian vein and including the jugular, axillary, basilic, radial, ulnar and upper cephalic vein. Spectral Doppler was utilized to evaluate flow at rest and with distal augmentation maneuvers.  COMPARISON:  None.  FINDINGS: Contralateral Subclavian Vein: Respiratory phasicity is normal and symmetric with the symptomatic side. No evidence of thrombus. Normal compressibility.  Internal Jugular Vein: No evidence of thrombus.  Normal compressibility, respiratory phasicity and response to augmentation.  Subclavian Vein: No evidence of thrombus. Normal compressibility, respiratory phasicity and response to augmentation.  Axillary Vein: No evidence of thrombus. Normal compressibility, respiratory phasicity and response to augmentation.  Cephalic Vein: No evidence of thrombus. Normal compressibility, respiratory phasicity and response to augmentation.  Basilic Vein: No evidence of thrombus. Normal compressibility, respiratory phasicity and response to augmentation.  Brachial Veins: No evidence of thrombus. Normal compressibility, respiratory phasicity and response to augmentation.  Radial Veins: No evidence of thrombus. Normal compressibility, respiratory phasicity and response to augmentation.  Ulnar Veins: No evidence of thrombus. Normal compressibility, respiratory phasicity and response to augmentation.  Venous Reflux:  None visualized.  Other Findings: No evidence of superficial thrombophlebitis or abnormal fluid collection.  IMPRESSION: No evidence of left upper extremity deep venous thrombosis.   Electronically Signed   By: Irish LackGlenn  Yamagata M.D.   On: 07/10/2014 12:22    Anti-infectives: Anti-infectives    Start     Dose/Rate Route Frequency Ordered Stop   07/09/14 1800  vancomycin (VANCOCIN) IVPB 750 mg/150 ml premix     750 mg150 mL/hr over 60 Minutes Intravenous Every 12 hours 07/09/14 0955     07/07/14 1000  piperacillin-tazobactam (ZOSYN) IVPB 3.375 g     3.375 g12.5 mL/hr over 240 Minutes Intravenous Every 8 hours 07/07/14 0914     07/07/14 1000  vancomycin (VANCOCIN) IVPB 1000 mg/200 mL premix  Status:  Discontinued     1,000 mg200 mL/hr over 60 Minutes Intravenous Every 12 hours 07/07/14 0914 07/09/14 [id number]  07/06/14 2230  vancomycin (VANCOCIN) 1,500 mg in sodium chloride 0.9 % 500 mL IVPB     1,500 mg250 mL/hr over 120 Minutes Intravenous  Once 07/06/14 2227 07/07/14 0206   07/06/14 2230  piperacillin-tazobactam  (ZOSYN) IVPB 3.375 g     3.375 g100 mL/hr over 30 Minutes Intravenous  Once 07/06/14 2227 07/07/14 0036      Assessment/Plan: s/p Procedure(s): DIVERTING COLOSTOMY DEBRIDEMENT OF SACRUM Impression: Stable status post diverting colostomy. Plan: Advance diet as tolerated. May DC rectal tube. If discharged, staples should be removed on 07/25/2014.  LOS: 6 days    Marilla Boddy A 07/12/2014

## 2014-07-12 NOTE — Addendum Note (Signed)
Addendum  created 07/12/14 0920 by Despina Hiddenobert J Bernabe Dorce, CRNA   Modules edited: Notes Section   Notes Section:  File: [id number]

## 2014-07-12 NOTE — Progress Notes (Signed)
ANTIBIOTIC CONSULT NOTE   Pharmacy Consult for Vancomycin and Zosyn Indication: wound infection  Allergies  Allergen Reactions  . Iohexol      Desc: PT STATES THROAT/FACE SWELLING W/ IVP DYE IN 1990. PT HAS NOT HAD ANY EXAMS DONE W/DYE SINCE AND HAS NEVER BEEN PRE MEDICATED. PER PT, Onset Date: [id number]   . Dilantin [Phenytoin Sodium Extended] Rash  . Tegretol [Carbamazepine] Rash   Patient Measurements: Height: 5\' 10"  (177.8 cm) Weight: 193 lb 12.8 oz (87.907 kg) IBW/kg (Calculated) : 73  Vital Signs: Temp: 98.3 F (36.8 C) (01/26 1012) Temp Source: Axillary (01/26 1012) BP: 121/74 mmHg (01/26 1012) Pulse Rate: 67 (01/26 1012) Intake/Output from previous day: 01/25 0701 - 01/26 0700 In: 2140 [P.O.:240; I.V.:1700; IV Piggyback:200] Out: 1250 [Urine:1200; Blood:50] Intake/Output from this shift:    Labs:  Recent Labs  07/11/14 0628 07/12/14 0544  WBC 12.9* 11.1*  HGB 10.6* 9.7*  PLT 328 289  CREATININE 0.64 0.85   Estimated Creatinine Clearance: 77.5 mL/min (by C-G formula based on Cr of 0.85). No results for input(s): VANCOTROUGH, VANCOPEAK, VANCORANDOM, GENTTROUGH, GENTPEAK, GENTRANDOM, TOBRATROUGH, TOBRAPEAK, TOBRARND, AMIKACINPEAK, AMIKACINTROU, AMIKACIN in the last 72 hours.   Microbiology: Recent Results (from the past 720 hour(s))  Urine culture     Status: None   Collection Time: 06/15/14  2:40 AM  Result Value Ref Range Status   Specimen Description URINE, CATHETERIZED  Final   Special Requests NONE  Final   Colony Count   Final    >=100,000 COLONIES/ML Performed at [facility]    Culture   Final    ENTEROCOCCUS SPECIES Performed at [facility]    Report Status 06/19/2014 FINAL  Final   Organism ID, Bacteria ENTEROCOCCUS SPECIES  Final      Susceptibility   Enterococcus species - MIC*    AMPICILLIN <=2 SENSITIVE Sensitive     LEVOFLOXACIN 1 SENSITIVE Sensitive     NITROFURANTOIN <=16 SENSITIVE Sensitive     VANCOMYCIN 2  SENSITIVE Sensitive     TETRACYCLINE >=16 RESISTANT Resistant     * ENTEROCOCCUS SPECIES  Clostridium Difficile by PCR     Status: None   Collection Time: 06/15/14  4:06 AM  Result Value Ref Range Status   C difficile by pcr NEGATIVE NEGATIVE Final  MRSA PCR Screening     Status: None   Collection Time: 06/15/14  2:00 PM  Result Value Ref Range Status   MRSA by PCR NEGATIVE NEGATIVE Final    Comment:        The GeneXpert MRSA Assay (FDA approved for NASAL specimens only), is one component of a comprehensive MRSA colonization surveillance program. It is not intended to diagnose MRSA infection nor to guide or monitor treatment for MRSA infections.   Culture, blood (routine x 2)     Status: None   Collection Time: 07/06/14  9:38 PM  Result Value Ref Range Status   Specimen Description BLOOD PORTA CATH DRAWN BY RN JJ  Final   Special Requests BOTTLES DRAWN AEROBIC AND ANAEROBIC 10CC EACH  Final   Culture NO GROWTH 5 DAYS  Final   Report Status 07/11/2014 FINAL  Final  Culture, blood (routine x 2)     Status: None   Collection Time: 07/06/14  9:38 PM  Result Value Ref Range Status   Specimen Description BLOOD LEFT FOREARM  Final   Special Requests BOTTLES DRAWN AEROBIC AND ANAEROBIC 6CC EACH  Final   Culture NO GROWTH 5 DAYS  Final   Report Status 07/11/2014 FINAL  Final  Urine culture     Status: None   Collection Time: 07/06/14 11:08 PM  Result Value Ref Range Status   Specimen Description URINE, CATHETERIZED  Final   Special Requests NONE  Final   Colony Count NO GROWTH Performed at [facility]   Final   Culture NO GROWTH Performed at [facility]   Final   Report Status 07/08/2014 FINAL  Final  Wound culture     Status: None   Collection Time: 07/06/14 11:09 PM  Result Value Ref Range Status   Specimen Description WOUND SACRAL DECUBITIS  Final   Special Requests NONE  Final   Gram Stain   Final    FEW WBC PRESENT, PREDOMINANTLY PMN NO SQUAMOUS  EPITHELIAL CELLS SEEN RARE GRAM POSITIVE COCCI IN PAIRS IN CLUSTERS RARE GRAM POSITIVE RODS Performed at [facility]    Culture   Final    MULTIPLE ORGANISMS PRESENT, NONE PREDOMINANT Note: NO STAPHYLOCOCCUS AUREUS ISOLATED NO GROUP A STREP (S.PYOGENES) ISOLATED Performed at [facility]    Report Status 07/09/2014 FINAL  Final  MRSA PCR Screening     Status: Abnormal   Collection Time: 07/06/14 11:15 PM  Result Value Ref Range Status   MRSA by PCR POSITIVE (A) NEGATIVE Final    Comment:        The GeneXpert MRSA Assay (FDA approved for NASAL specimens only), is one component of a comprehensive MRSA colonization surveillance program. It is not intended to diagnose MRSA infection nor to guide or monitor treatment for MRSA infections. RESULT CALLED TO, READ BACK BY AND VERIFIED WITH: HANDY L AT 0052 ON [id number] BY FORSYTH K   Surgical pcr screen     Status: Abnormal   Collection Time: 07/10/14  8:07 PM  Result Value Ref Range Status   MRSA, PCR POSITIVE (A) NEGATIVE Final    Comment: RESULT CALLED TO, READ BACK BY AND VERIFIED WITH:  HEARN,J @ 0012 ON 07/11/14 BY WOODIE,J    Staphylococcus aureus POSITIVE (A) NEGATIVE Final    Comment:        The Xpert SA Assay (FDA approved for NASAL specimens in patients over 60 years of age), is one component of a comprehensive surveillance program.  Test performance has been validated by [facility] for patients greater than or equal to 66 year old. It is not intended to diagnose infection nor to guide or monitor treatment. RESULT CALLED TO, READ BACK BY AND VERIFIED WITH:  HEARN,J @ 0012 ON 07/11/14 BY WOODIE,J    Medical History: Past Medical History  Diagnosis Date  . Heart disease   . Atrial fibrillation     WITH PACEMAKER  . OSA (obstructive sleep apnea)     does not use CPAP  . Memory loss   . Bleeding ulcer   . B12 deficiency   . Hypertension   . Abnormality of gait 05/03/2013  . Essential and  other specified forms of tremor 05/03/2013  . Polyneuropathy in other diseases classified elsewhere 05/03/2013  . Peripheral edema   . Varicose veins     Bilateral  . Macular degeneration of left eye   . Bilateral hydronephrosis 06/15/2014  . Bladder calculi 06/15/2014  . Multiple rib fractures 06/15/2014   Anti-infectives    Start     Dose/Rate Route Frequency Ordered Stop   07/09/14 1800  vancomycin (VANCOCIN) IVPB 750 mg/150 ml premix     750 mg150 mL/hr  over 60 Minutes Intravenous Every 12 hours 07/09/14 0955     07/07/14 1000  piperacillin-tazobactam (ZOSYN) IVPB 3.375 g     3.375 g12.5 mL/hr over 240 Minutes Intravenous Every 8 hours 07/07/14 0914     07/07/14 1000  vancomycin (VANCOCIN) IVPB 1000 mg/200 mL premix  Status:  Discontinued     1,000 mg200 mL/hr over 60 Minutes Intravenous Every 12 hours 07/07/14 0914 07/09/14 0953   07/06/14 2230  vancomycin (VANCOCIN) 1,500 mg in sodium chloride 0.9 % 500 mL IVPB     1,500 mg250 mL/hr over 120 Minutes Intravenous  Once 07/06/14 2227 07/07/14 0206   07/06/14 2230  piperacillin-tazobactam (ZOSYN) IVPB 3.375 g     3.375 g100 mL/hr over 30 Minutes Intravenous  Once 07/06/14 2227 07/07/14 0036     Assessment: HPI: Patient is an 81 year old white male nursing home patient who has a large sacral decubitus ulcer extending to the anus. Wound care has been very difficult at the nursing home. He started developing fevers and a request has been made for a diverting colostomy. The patient was admitted by the hospitalist for stabilization.  Estimated Creatinine Clearance: 77.5 mL/min (by C-G formula based on Cr of 0.85).   SCr is stable.  Afebrile.   Vancomycin trough above goal when last checked and Vancomycin dose adjusted  Goal of Therapy:  Vancomycin trough level 10-15 mcg/ml  Eradicate infection.  Plan:  Continue Vancomycin 750 mg IV q12hrs Check trough at steady state or as indicated Continue Zosyn 3.375gm IV q8h, each dose over 4  hrs Monitor labs, renal fxn, and cultures  Jon [name] A 07/12/2014,12:02 PM

## 2014-07-12 NOTE — Progress Notes (Signed)
Attempted dressing change, but Dacon's solution is unavailable from pharmacy. Will notify day shift nurse to notify pharmacy.

## 2014-07-12 NOTE — Discharge Summary (Signed)
Physician Discharge Summary  Jon [name] ZOX:[id number] DOB: [birth year] DOA: 07/06/2014  PCP: Milana Obey, MD  Admit date: 07/06/2014 Discharge date: 07/12/2014  Time spent: 45 minutes  Recommendations for Outpatient Follow-up:  -Will be discharged back to SNF today. -Will be on 2 weeks of IV vanc/zosyn delivered via PICC line until 07/26/14. -Will need to follow up with Dr. Lovell Sheehan on 07/25/14 for staple removal.   Discharge Diagnoses:  Principal Problem:   Sacral ulcer Active Problems:   Atrial fibrillation   Polyneuropathy in other diseases classified elsewhere   Anemia of chronic disease   Decubitus ulcer   Malnutrition of moderate degree   Discharge Condition: Stable and improved  Filed Weights   07/06/14 1811 07/06/14 2312 07/08/14 0615  Weight: 81.647 kg (180 lb) 84.505 kg (186 lb 4.8 oz) 87.907 kg (193 lb 12.8 oz)    History of present illness:  This is a 81 y.o. year old male with significant past medical history of dementia, afib not on anticoagulation 2/2 recurrent falls, cardiac pacemaker, recent admission for sepsis 2/2 enterococcal UTI presenting with wound infection. Level V caveat as pt is fairly confused. Son at bedside who is somewhat unfamiliar W/ pt since he has been in SNF. Pt noted to have been recently admitted 06/14/14-06/18/14 for sepsis 2/2 enterococcal UTI, AKI. Pt has been resident of local SNF since hospital discharge. Per report, pt has had worsening sacral ulcer while at nursing home. A PICC line was noted to have been placed yesterday w/ vanc and cefepime. Per report (which is relatively unclear at this point) wound care vs. SNF MD discussed case w/ Surgery Lovell Sheehan) who asked for pt to be admitted to hospitalist service for further eval. Possible diverting colostomy.  Presented to ER afebrile, hemodynamically stable. WBC 16.6, hgb 11.2, Cr 0.55. Na 129. CXR w/ stable PICC placement.   Hospital Course:   1. Large stage IV decubitus  ulcer with suspected infection. Status post debridement in the OR and diverting colostomy 1/25. To remain on vanc/zosyn for 2 weeks until 07/26/14. Will need continued wound care and follow up with Dr. Lovell Sheehan on 07/25/14. 2. Atrial fibrillation. Remains stable. Continue sotalol. History of pacemaker placement. 3. Anemia of chronic disease. Stable. 4. Left upper extremity edema. Left upper extremity venous ultrasound negative for DVT. 5. Hypokalemia, repleted. 6. Hyponatremia, resolved 7. History of bilateral hydronephrosis with multiple bladder calculi seen 05/2014. 8. History of rib fractures secondary to traumatic fall 05/2014. 9. History dementia, essential tremor, polyneuropathy, B12 deficiency, gait abnormality. Vitamin B-12 level was elevated. 10. Moderate malnutrition  Procedures:  Diverting colostomy 1/25   Consultations:  Surgery, Dr. Lovell Sheehan  Discharge Instructions  Discharge Instructions    Diet - low sodium heart healthy    Complete by:  As directed      Increase activity slowly    Complete by:  As directed             Medication List    STOP taking these medications        Cefepime HCl 2 GM/100ML Soln     fluconazole 150 MG tablet  Commonly known as:  DIFLUCAN     oxyCODONE 5 MG immediate release tablet  Commonly known as:  Oxy IR/ROXICODONE     potassium chloride 10 MEQ tablet  Commonly known as:  K-DUR,KLOR-CON     VANCOMYCIN HCL PO     Zinc Oxide 10 % Oint      TAKE these medications  ALPRAZolam 1 MG tablet  Commonly known as:  XANAX  Take 0.5 tablets (0.5 mg total) by mouth 2 (two) times daily as needed for anxiety.     diltiazem 60 MG tablet  Commonly known as:  CARDIZEM  Take 60 mg by mouth daily.     feeding supplement (PRO-STAT SUGAR FREE 64) Liqd  Take 30 mLs by mouth 3 (three) times daily with meals.     HYDROcodone-acetaminophen 5-325 MG per tablet  Commonly known as:  NORCO/VICODIN  Take 1 tablet by mouth every 6 (six) hours  as needed for moderate pain.     multivitamin with minerals Tabs tablet  Take 1 tablet by mouth daily.     omeprazole 20 MG capsule  Commonly known as:  PRILOSEC  Take 20 mg by mouth daily.     piperacillin-tazobactam 3.375 GM/50ML IVPB  Commonly known as:  ZOSYN  Inject 50 mLs (3.375 g total) into the vein every 8 (eight) hours.     sotalol 160 MG tablet  Commonly known as:  BETAPACE  TAKE 1 TABLET BY MOUTH TWICE DAILY.     Vancomycin 750 MG/150ML Soln  Commonly known as:  VANCOCIN  Inject 150 mLs (750 mg total) into the vein every 12 (twelve) hours.       Allergies  Allergen Reactions  . Iohexol      Desc: PT STATES THROAT/FACE SWELLING W/ IVP DYE IN 1990. PT HAS NOT HAD ANY EXAMS DONE W/DYE SINCE AND HAS NEVER BEEN PRE MEDICATED. PER PT, Onset Date: [id number]   . Dilantin [Phenytoin Sodium Extended] Rash  . Tegretol [Carbamazepine] Rash       Follow-up Information    Follow up with Dalia Heading, MD On 07/25/2014.   Specialty:  General Surgery   Why:  For staple removal   Contact information:   [address] [place] [address] [phone]        The results of significant diagnostics from this hospitalization (including imaging, microbiology, ancillary and laboratory) are listed below for reference.    Significant Diagnostic Studies: Ct Abdomen Pelvis Wo Contrast  06/15/2014   CLINICAL DATA:  Acute abdominal pain with diarrhea  EXAM: CT ABDOMEN AND PELVIS WITHOUT CONTRAST  TECHNIQUE: Multidetector CT imaging of the abdomen and pelvis was performed following the standard protocol without IV contrast.  COMPARISON:  None.  FINDINGS: BODY WALL: Unremarkable.  LOWER CHEST: Right ventricular pacer lead.  Trace right pleural effusion with mild atelectasis. This is presumably reactive to a mildly displaced right 12 and 10 rib fractures.  ABDOMEN/PELVIS:  Liver: No focal abnormality.  Biliary: Cholelithiasis.  No evidence of acute cholecystitis.  Pancreas:  Unremarkable.  Spleen: Unremarkable.  Adrenals: Unremarkable.  Kidneys and ureters: Mild bilateral hydroureteronephrosis secondary to urinary bladder distention.  There is a large exophytic cyst from the right lower pole measuring 15 cm in maximal dimension. Mildly high-density 1 cm right upper pole and 2.5 cm left lower pole renal lesions likely reflect proteinaceous or hemorrhagic cysts.  Bladder: Small calculi layer within the distended bladder. Suspect chronic outlet obstruction.  Reproductive: Unremarkable.  Bowel: No obstruction. Normal appendix.  Retroperitoneum: There is a 2.5 cm nodule anterior to the duodenal bulb which is stable since 2007. The nodule is discrete from the liver, but contiguous with the duodenum. Although this portion the duodenum is flattened, no evidence of gastric outlet obstruction.  Peritoneum: No ascites or pneumoperitoneum.  Vascular: No acute abnormality.  OSSEOUS:  Moderately displaced right tenth and nondisplaced right  twelfth rib fractures.  Advanced lumbar degenerative disc and facet disease with multilevel canal and foraminal stenosis.  IMPRESSION: 1. Bilateral hydronephrosis from urinary retention. Retention is likely chronic as there are multiple bladder calculi. 2. Displaced right tenth and twelfth rib fractures with small pleural effusion and atelectasis. 3. Cholelithiasis. 4. Bilateral renal cysts. There are high density renal lesions bilaterally which are likely hemorrhagic/proteinaceous cysts. If renal ultrasound performed to follow-up the hydronephrosis, recommend attention to these lesions. 5. 2.5 cm mass in the right upper quadrant, contiguous with the duodenum, is stable since 2007 and benign.   Electronically Signed   By: Tiburcio PeaJonathan  Watts M.D.   On: 06/15/2014 05:03   [place] Venous Img Upper Uni Left  07/10/2014   CLINICAL DATA:  Left upper extremity edema. History of left subclavian pacemaker placement prior right upper extremity PICC line.  EXAM: LEFT UPPER  EXTREMITY VENOUS DOPPLER ULTRASOUND  TECHNIQUE: Gray-scale sonography with graded compression, as well as color Doppler and duplex ultrasound were performed to evaluate the upper extremity deep venous system from the level of the subclavian vein and including the jugular, axillary, basilic, radial, ulnar and upper cephalic vein. Spectral Doppler was utilized to evaluate flow at rest and with distal augmentation maneuvers.  COMPARISON:  None.  FINDINGS: Contralateral Subclavian Vein: Respiratory phasicity is normal and symmetric with the symptomatic side. No evidence of thrombus. Normal compressibility.  Internal Jugular Vein: No evidence of thrombus. Normal compressibility, respiratory phasicity and response to augmentation.  Subclavian Vein: No evidence of thrombus. Normal compressibility, respiratory phasicity and response to augmentation.  Axillary Vein: No evidence of thrombus. Normal compressibility, respiratory phasicity and response to augmentation.  Cephalic Vein: No evidence of thrombus. Normal compressibility, respiratory phasicity and response to augmentation.  Basilic Vein: No evidence of thrombus. Normal compressibility, respiratory phasicity and response to augmentation.  Brachial Veins: No evidence of thrombus. Normal compressibility, respiratory phasicity and response to augmentation.  Radial Veins: No evidence of thrombus. Normal compressibility, respiratory phasicity and response to augmentation.  Ulnar Veins: No evidence of thrombus. Normal compressibility, respiratory phasicity and response to augmentation.  Venous Reflux:  None visualized.  Other Findings: No evidence of superficial thrombophlebitis or abnormal fluid collection.  IMPRESSION: No evidence of left upper extremity deep venous thrombosis.   Electronically Signed   By: Irish LackGlenn  Yamagata M.D.   On: 07/10/2014 12:22   Dg Chest Port 1 View  07/05/2014   CLINICAL DATA:  PICC placement  EXAM: PORTABLE CHEST - 1 VIEW  COMPARISON:   06/15/2014  FINDINGS: Right arm PICC has been placed with tip in the SVC at the cavoatrial junction in good position.  Heart size upper normal. Dual lead pacemaker unchanged. Lungs remain clear.  IMPRESSION: PICC tip in the lower SVC in good position.   Electronically Signed   By: Marlan Palauharles  Clark M.D.   On: 07/05/2014 14:36   Dg Chest Port 1 View  06/15/2014   CLINICAL DATA:  Hypoxia and weakness  EXAM: PORTABLE CHEST - 1 VIEW  COMPARISON:  06/09/2014  FINDINGS: There is a dual-chamber pacer from the left with leads in unchanged position.  Unchanged mild cardiomegaly and aortic tortuosity. The lungs are chronically hyperinflated. There is no edema, consolidation, effusion, or pneumothorax.  IMPRESSION: Stable exam.  No evidence of acute cardiopulmonary disease.   Electronically Signed   By: Tiburcio PeaJonathan  Watts M.D.   On: 06/15/2014 03:01    Microbiology: Recent Results (from the past 240 hour(s))  Culture, blood (routine x 2)  Status: None   Collection Time: 07/06/14  9:38 PM  Result Value Ref Range Status   Specimen Description BLOOD PORTA CATH DRAWN BY RN JJ  Final   Special Requests BOTTLES DRAWN AEROBIC AND ANAEROBIC 10CC EACH  Final   Culture NO GROWTH 5 DAYS  Final   Report Status 07/11/2014 FINAL  Final  Culture, blood (routine x 2)     Status: None   Collection Time: 07/06/14  9:38 PM  Result Value Ref Range Status   Specimen Description BLOOD LEFT FOREARM  Final   Special Requests BOTTLES DRAWN AEROBIC AND ANAEROBIC 6CC EACH  Final   Culture NO GROWTH 5 DAYS  Final   Report Status 07/11/2014 FINAL  Final  Urine culture     Status: None   Collection Time: 07/06/14 11:08 PM  Result Value Ref Range Status   Specimen Description URINE, CATHETERIZED  Final   Special Requests NONE  Final   Colony Count NO GROWTH Performed at [facility]   Final   Culture NO GROWTH Performed at [facility]   Final   Report Status 07/08/2014 FINAL  Final  Wound culture     Status:  None   Collection Time: 07/06/14 11:09 PM  Result Value Ref Range Status   Specimen Description WOUND SACRAL DECUBITIS  Final   Special Requests NONE  Final   Gram Stain   Final    FEW WBC PRESENT, PREDOMINANTLY PMN NO SQUAMOUS EPITHELIAL CELLS SEEN RARE GRAM POSITIVE COCCI IN PAIRS IN CLUSTERS RARE GRAM POSITIVE RODS Performed at [facility]    Culture   Final    MULTIPLE ORGANISMS PRESENT, NONE PREDOMINANT Note: NO STAPHYLOCOCCUS AUREUS ISOLATED NO GROUP A STREP (S.PYOGENES) ISOLATED Performed at [facility]    Report Status 07/09/2014 FINAL  Final  MRSA PCR Screening     Status: Abnormal   Collection Time: 07/06/14 11:15 PM  Result Value Ref Range Status   MRSA by PCR POSITIVE (A) NEGATIVE Final    Comment:        The GeneXpert MRSA Assay (FDA approved for NASAL specimens only), is one component of a comprehensive MRSA colonization surveillance program. It is not intended to diagnose MRSA infection nor to guide or monitor treatment for MRSA infections. RESULT CALLED TO, READ BACK BY AND VERIFIED WITH: HANDY L AT 0052 ON [id number] BY FORSYTH K   Surgical pcr screen     Status: Abnormal   Collection Time: 07/10/14  8:07 PM  Result Value Ref Range Status   MRSA, PCR POSITIVE (A) NEGATIVE Final    Comment: RESULT CALLED TO, READ BACK BY AND VERIFIED WITH:  HEARN,J @ 0012 ON 07/11/14 BY WOODIE,J    Staphylococcus aureus POSITIVE (A) NEGATIVE Final    Comment:        The Xpert SA Assay (FDA approved for NASAL specimens in patients over 24 years of age), is one component of a comprehensive surveillance program.  Test performance has been validated by [facility] for patients greater than or equal to 65 year old. It is not intended to diagnose infection nor to guide or monitor treatment. RESULT CALLED TO, READ BACK BY AND VERIFIED WITH:  HEARN,J @ 0012 ON 07/11/14 BY WOODIE,J      Labs: Basic Metabolic Panel:  Recent Labs Lab 07/07/14 0500  07/08/14 0557 07/09/14 0537 07/11/14 0628 07/12/14 0544  NA 129* 132* 136 135 137  K 3.6 3.3* 3.7 3.6 3.4*  CL 94* 97 103  97 101  CO2 29 27 28 28 29   GLUCOSE 123* 104* 84 116* 133*  BUN 11 12 13 13 16   CREATININE 0.55 0.67 0.64 0.64 0.85  CALCIUM 7.4* 7.7* 7.8* 8.2* 7.6*  MG  --   --   --   --  1.5  PHOS  --   --   --   --  3.6   Liver Function Tests:  Recent Labs Lab 07/07/14 0500  AST 21  ALT 17  ALKPHOS 80  BILITOT 0.7  PROT 4.6*  ALBUMIN 1.9*   No results for input(s): LIPASE, AMYLASE in the last 168 hours. No results for input(s): AMMONIA in the last 168 hours. CBC:  Recent Labs Lab 07/06/14 1931 07/07/14 0500 07/08/14 0557 07/11/14 0628 07/12/14 0544  WBC 16.6* 13.6* 11.5* 12.9* 11.1*  NEUTROABS 14.5* 12.4*  --   --   --   HGB 11.2* 10.1* 9.9* 10.6* 9.7*  HCT 33.1* 29.8* 30.0* 32.4* 31.2*  MCV 83.0 83.7 84.5 84.6 86.4  PLT 273 274 303 328 289   Cardiac Enzymes: No results for input(s): CKTOTAL, CKMB, CKMBINDEX, TROPONINI in the last 168 hours. BNP: BNP (last 3 results) No results for input(s): PROBNP in the last 8760 hours. CBG: No results for input(s): GLUCAP in the last 168 hours.     SignedChaya Jan  [facility] Pager: [phone] 07/12/2014, 2:40 PM

## 2014-07-12 NOTE — Clinical Social Work Note (Signed)
Pt d/c today back to [facility]. Pt's son, Casimiro NeedleMichael is aware and agreeable. D/C summary faxed. Pt to transport [facility]. Facility also notified of PICC line for IV antibiotics and colostomy.   Derenda FennelKara Nataly [place], [place] [phone]

## 2014-07-12 NOTE — Progress Notes (Signed)
Dressing changed to sacrum wound. PICC line flushed.Patient transported to [facility] via EMS. No distress noted. Foley catheter intact. Son present at bedside. No distress noted. Colostomy bag intact.

## 2014-07-12 NOTE — Anesthesia Postprocedure Evaluation (Signed)
  Anesthesia Post-op Note  Patient: [name] Bier  Procedure(s) Performed: Procedure(s) with comments: DIVERTING COLOSTOMY (N/A) - wound class: clean contaminated DEBRIDEMENT OF SACRUM (N/A)  Patient Location: room 323  Anesthesia Type:General  Level of Consciousness: awake, alert  and patient cooperative  Airway and Oxygen Therapy: Patient Spontanous Breathing and Patient connected to nasal cannula oxygen  Post-op Pain: 2 /10, mild  Post-op Assessment: Post-op Vital signs reviewed, Patient's Cardiovascular Status Stable, Respiratory Function Stable, Patent Airway, No signs of Nausea or vomiting, Adequate PO intake and Pain level controlled  Post-op Vital Signs: Reviewed and stable  Last Vitals:  Filed Vitals:   07/12/14 0500  BP: 109/76  Pulse: 82  Temp: 36.6 C  Resp: 18    Complications: No apparent anesthesia complications

## 2014-07-13 NOTE — Progress Notes (Signed)
UR chart review completed.  

## 2014-09-15 ENCOUNTER — Encounter: Payer: Self-pay | Admitting: *Deleted

## 2014-10-12 ENCOUNTER — Encounter: Payer: Self-pay | Admitting: *Deleted

## 2014-11-08 ENCOUNTER — Telehealth: Payer: Self-pay | Admitting: Cardiovascular Disease

## 2014-11-08 NOTE — Telephone Encounter (Signed)
New Message  Pt calling to make office aware pt is admitted to [name]. Pt wondering if he can send remote from Nursing facility or just wait until pt is d/c. Please call back and discuss.

## 2014-11-09 NOTE — Telephone Encounter (Signed)
[facility].

## 2014-11-10 NOTE — Telephone Encounter (Signed)
Attempted to call pt x 1---NA, NVM. sss

## 2014-11-18 ENCOUNTER — Encounter: Payer: Self-pay | Admitting: *Deleted

## 2014-11-25 ENCOUNTER — Telehealth: Payer: Self-pay | Admitting: Cardiovascular Disease

## 2014-11-25 NOTE — Telephone Encounter (Signed)
Pt want to notify you that he have been in Rehab. He have not be able to transmits his pacemaker,been in [place] since December and probably will be there a couple more months.Pt had stomach surgery and got an infection. He wants to know if there is some way his pacemaker can be checked in Rehab.

## 2014-11-27 NOTE — Telephone Encounter (Signed)
Any chance we can get a [employer] check or download from rehab?

## 2014-11-28 ENCOUNTER — Ambulatory Visit (INDEPENDENT_AMBULATORY_CARE_PROVIDER_SITE_OTHER): Payer: Medicare Other | Admitting: *Deleted

## 2014-11-28 DIAGNOSIS — I480 Paroxysmal atrial fibrillation: Secondary | ICD-10-CM | POA: Diagnosis not present

## 2014-11-28 LAB — CUP PACEART INCLINIC DEVICE CHECK
Battery Impedance: 208 Ohm
Battery Remaining Longevity: 104 mo
Battery Voltage: 2.79 V
Brady Statistic AS VP Percent: 10 %
Lead Channel Pacing Threshold Amplitude: 0.75 V
Lead Channel Pacing Threshold Pulse Width: 0.4 ms
Lead Channel Sensing Intrinsic Amplitude: 8 mV
Lead Channel Setting Pacing Amplitude: 2.5 V
Lead Channel Setting Sensing Sensitivity: 2.8 mV
MDC IDC MSMT LEADCHNL RA IMPEDANCE VALUE: 460 Ohm
MDC IDC MSMT LEADCHNL RA SENSING INTR AMPL: 4 mV
MDC IDC MSMT LEADCHNL RV IMPEDANCE VALUE: 632 Ohm
MDC IDC SESS DTM: 20160613153235
MDC IDC SET LEADCHNL RA PACING AMPLITUDE: 3.5 V
MDC IDC SET LEADCHNL RV PACING PULSEWIDTH: 0.4 ms
MDC IDC STAT BRADY AP VP PERCENT: 3 %
MDC IDC STAT BRADY AP VS PERCENT: 66 %
MDC IDC STAT BRADY AS VS PERCENT: 21 %

## 2014-11-28 NOTE — Progress Notes (Signed)
Pacemaker check in clinic. Normal device function. Thresholds, sensing, impedances consistent with previous measurements. Device programmed to maximize longevity. Pt in AF 38.0% of time. per Nurse at [facility] no anticoagulation at this time. No reason was given to why anticoagulation was stopped but pt had infection in hip and now has colestomy. No high ventricular rates noted. Device programmed at appropriate safety margins. Histogram distribution appropriate for patient activity level. Device programmed to optimize intrinsic conduction. Estimated longevity 8.5 years. Patient enrolled in remote follow-up and [employer] ordered per Britta Mccreedy. Pt is at [facility] and per pt going home in 2 mths. Pt to call once out of [facility]. [facility] 02-27-15 and overdue with [facility].

## 2014-11-28 NOTE — Telephone Encounter (Signed)
Pt is at [facility] in [place]. Spoke w/ device tech and she said she would go out to facility to check pt today around 12:30 - 1 PM to check his PPM. Pt verbalized understanding.

## 2014-12-12 ENCOUNTER — Telehealth: Payer: Self-pay | Admitting: *Deleted

## 2014-12-12 NOTE — Telephone Encounter (Signed)
Signed order for monthly INRs faxed to [facility]. 

## 2014-12-20 ENCOUNTER — Encounter: Payer: Self-pay | Admitting: Cardiovascular Disease

## 2015-01-10 ENCOUNTER — Ambulatory Visit: Payer: Self-pay | Admitting: Pharmacist Clinician (PhC)/ Clinical Pharmacy Specialist

## 2015-01-10 DIAGNOSIS — Z7901 Long term (current) use of anticoagulants: Secondary | ICD-10-CM

## 2015-01-10 DIAGNOSIS — I480 Paroxysmal atrial fibrillation: Secondary | ICD-10-CM

## 2015-02-27 ENCOUNTER — Telehealth: Payer: Self-pay | Admitting: Cardiology

## 2015-02-27 ENCOUNTER — Ambulatory Visit (INDEPENDENT_AMBULATORY_CARE_PROVIDER_SITE_OTHER): Payer: Medicare Other | Admitting: *Deleted

## 2015-02-27 ENCOUNTER — Encounter: Payer: Self-pay | Admitting: Cardiovascular Disease

## 2015-02-27 DIAGNOSIS — I480 Paroxysmal atrial fibrillation: Secondary | ICD-10-CM

## 2015-02-27 NOTE — Telephone Encounter (Signed)
LMOVM reminding pt to send remote transmission.   

## 2015-02-28 NOTE — Progress Notes (Signed)
Remote pacemaker transmission.   

## 2015-03-09 LAB — CUP PACEART REMOTE DEVICE CHECK
Battery Remaining Longevity: 96 mo
Battery Voltage: 2.79 V
Brady Statistic AP VP Percent: 6 %
Brady Statistic AP VS Percent: 67 %
Brady Statistic AS VS Percent: 18 %
Lead Channel Impedance Value: 400 Ohm
Lead Channel Impedance Value: 608 Ohm
Lead Channel Pacing Threshold Amplitude: 0.875 V
Lead Channel Pacing Threshold Amplitude: 1.75 V
Lead Channel Pacing Threshold Pulse Width: 0.4 ms
Lead Channel Pacing Threshold Pulse Width: 0.4 ms
Lead Channel Sensing Intrinsic Amplitude: 5.6 mV — CL
Lead Channel Setting Pacing Amplitude: 2.5 V
Lead Channel Setting Sensing Sensitivity: 2.8 mV
MDC IDC MSMT BATTERY IMPEDANCE: 257 Ohm
MDC IDC MSMT LEADCHNL RA SENSING INTR AMPL: 2.8 mV — AB
MDC IDC SESS DTM: 20160913030147
MDC IDC SET LEADCHNL RA PACING AMPLITUDE: 3.5 V
MDC IDC SET LEADCHNL RV PACING PULSEWIDTH: 0.46 ms
MDC IDC STAT BRADY AS VP PERCENT: 9 %

## 2015-03-14 ENCOUNTER — Other Ambulatory Visit (HOSPITAL_COMMUNITY)
Admission: AD | Admit: 2015-03-14 | Discharge: 2015-03-14 | Disposition: A | Discharge status: Home / Self Care | Payer: Medicare Other | Source: Other Acute Inpatient Hospital | Attending: Family Medicine | Admitting: Family Medicine

## 2015-03-14 DIAGNOSIS — N390 Urinary tract infection, site not specified: Secondary | ICD-10-CM | POA: Diagnosis present

## 2015-03-14 LAB — URINALYSIS, ROUTINE W REFLEX MICROSCOPIC
BILIRUBIN URINE: NEGATIVE
Glucose, UA: NEGATIVE mg/dL
KETONES UR: NEGATIVE mg/dL
NITRITE: POSITIVE — AB
PROTEIN: 100 mg/dL — AB
Specific Gravity, Urine: 1.01 (ref 1.005–1.030)
UROBILINOGEN UA: 0.2 mg/dL (ref 0.0–1.0)

## 2015-03-14 LAB — URINE MICROSCOPIC-ADD ON

## 2015-03-16 LAB — URINE CULTURE

## 2015-03-22 ENCOUNTER — Encounter: Payer: Self-pay | Admitting: Cardiology

## 2015-03-23 ENCOUNTER — Telehealth: Payer: Self-pay | Admitting: Cardiovascular Disease

## 2015-03-23 MED ORDER — DILTIAZEM HCL 60 MG PO TABS: 60.0000 mg | ORAL_TABLET | Freq: Every day | ORAL | Status: DC

## 2015-03-23 NOTE — Telephone Encounter (Signed)
Pt recently home from Nursing Facility few weeks ago and was confused on medications he came home with as they are different from when he first went in.  Let pt know that according to our list he should be taking the 60 mg of Cardizim.  Pt stated that he would need a refill as he was taking 120 mg capsules before and that is all he has.  Updated Rx sent to preferred pharmacy and pt reminded that when able he will need to setup appt with Dr. Salena Saner for annual evaluation.

## 2015-03-23 NOTE — Telephone Encounter (Signed)
Mr. Winterhalter is calling because he is just getting home from a nursing home and believe that they have changed one of his medication (Cardiziem from  to  ) and wants to know what Dr. Royann Shivers Thinks about that. Please call   Thanks

## 2015-03-29 ENCOUNTER — Other Ambulatory Visit: Payer: Self-pay | Admitting: Cardiovascular Disease

## 2015-03-29 NOTE — Telephone Encounter (Signed)
Rx(s) sent to pharmacy electronically.  

## 2015-04-24 ENCOUNTER — Telehealth: Payer: Self-pay | Admitting: Cardiovascular Disease

## 2015-04-24 NOTE — Telephone Encounter (Signed)
Jon RegalCarol called in stating that the pt's BP has been low and he had broken out in a rash with welps. She would like to be advised on what to do. Please f/u with her  Thanks

## 2015-04-24 NOTE — Telephone Encounter (Signed)
Called patient and communicated MD advice. He voiced understanding. He had son write down the information provided.

## 2015-04-24 NOTE — Telephone Encounter (Signed)
About 1.5 week ago - patient started to have itching welpy rash 10/6 - only med change was cardizem 120ER decreased to diltiazem 60mg  - Okey RegalCarol RN wonders if additives in this pill could be causing reaction.  This was a med change when patient was sent home from nursing facility.   BP 98/52  No complaints  Okey RegalCarol RN wants to know if MD would like to order steroid dose pack - informed her that PCP could probably address this.   Unsure how to tell if additives in medications are causing reaction?   [facility], pharmacist and Dr. Royann Shiversroitoru to review and advise.    Of note, last visit 08/2013 - per Okey Regalarol RN patient is bed bound and would have to travel via ambulance to MD office

## 2015-04-24 NOTE — Telephone Encounter (Signed)
No high ventricular rates were seen at his last device check. Stop diltiazem and recheck device in a month. Call us if he has HR >110. However, there is likely to be a different cause for his rash

## 2015-04-29 ENCOUNTER — Other Ambulatory Visit: Payer: Self-pay | Admitting: Cardiovascular Disease
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
## 2015-05-02 NOTE — Telephone Encounter (Signed)
Pt has not been seen since 08/26/2013. Would you like to refill this medication? Please advise

## 2015-05-09 ENCOUNTER — Emergency Department (HOSPITAL_COMMUNITY)
Admission: EM | Admit: 2015-05-09 | Discharge: 2015-05-09 | Disposition: A | Discharge status: Home / Self Care | Payer: Medicare Other | Attending: Emergency Medicine | Admitting: Emergency Medicine

## 2015-05-09 ENCOUNTER — Encounter (HOSPITAL_COMMUNITY): Payer: Self-pay

## 2015-05-09 DIAGNOSIS — Z8669 Personal history of other diseases of the nervous system and sense organs: Secondary | ICD-10-CM | POA: Insufficient documentation

## 2015-05-09 DIAGNOSIS — Z950 Presence of cardiac pacemaker: Secondary | ICD-10-CM | POA: Diagnosis not present

## 2015-05-09 DIAGNOSIS — I10 Essential (primary) hypertension: Secondary | ICD-10-CM | POA: Diagnosis not present

## 2015-05-09 DIAGNOSIS — Z87891 Personal history of nicotine dependence: Secondary | ICD-10-CM | POA: Diagnosis not present

## 2015-05-09 DIAGNOSIS — Z87448 Personal history of other diseases of urinary system: Secondary | ICD-10-CM | POA: Insufficient documentation

## 2015-05-09 DIAGNOSIS — Z8639 Personal history of other endocrine, nutritional and metabolic disease: Secondary | ICD-10-CM | POA: Diagnosis not present

## 2015-05-09 DIAGNOSIS — Z79899 Other long term (current) drug therapy: Secondary | ICD-10-CM | POA: Insufficient documentation

## 2015-05-09 DIAGNOSIS — Z8781 Personal history of (healed) traumatic fracture: Secondary | ICD-10-CM | POA: Insufficient documentation

## 2015-05-09 DIAGNOSIS — N4889 Other specified disorders of penis: Secondary | ICD-10-CM | POA: Diagnosis present

## 2015-05-09 DIAGNOSIS — N390 Urinary tract infection, site not specified: Secondary | ICD-10-CM | POA: Insufficient documentation

## 2015-05-09 DIAGNOSIS — Z8719 Personal history of other diseases of the digestive system: Secondary | ICD-10-CM | POA: Insufficient documentation

## 2015-05-09 LAB — URINALYSIS, ROUTINE W REFLEX MICROSCOPIC
BILIRUBIN URINE: NEGATIVE
Glucose, UA: NEGATIVE mg/dL
Ketones, ur: NEGATIVE mg/dL
Nitrite: NEGATIVE
PH: 7.5 (ref 5.0–8.0)
Protein, ur: 30 mg/dL — AB
Specific Gravity, Urine: 1.015 (ref 1.005–1.030)

## 2015-05-09 LAB — URINE MICROSCOPIC-ADD ON

## 2015-05-09 MED ORDER — LEVOFLOXACIN 500 MG PO TABS
500.0000 mg | ORAL_TABLET | Freq: Once | ORAL | Status: AC
  Administered 2015-05-09: 500 mg via ORAL
  Filled 2015-05-09: qty 1

## 2015-05-09 MED ORDER — LEVOFLOXACIN 500 MG PO TABS: 500.0000 mg | ORAL_TABLET | Freq: Every day | ORAL | Status: DC

## 2015-05-09 NOTE — ED Notes (Signed)
Patient arrives with Foley Catheter in place. Patient also C/O "itching all over"

## 2015-05-09 NOTE — ED Notes (Signed)
Family at bedside. 

## 2015-05-09 NOTE — ED Notes (Signed)
Lab called at this time stating specimen was too "thick" and testing could not be performed. [facility]

## 2015-05-09 NOTE — ED Provider Notes (Signed)
CSN: [id number]     Arrival date & time 05/09/15  0406 History   First MD Initiated Contact with Patient 05/09/15 0408     Chief Complaint  Patient presents with  . Penis Pain     (Consider location/radiation/quality/duration/timing/severity/associated sxs/prior Treatment) HPI Patient with chronic catheter placement for the past year. States it was last changed several weeks ago. Complains of pain at the site of insertion of the catheter into the penis. Denies any fevers or chills. No abdominal pain. No nausea or vomiting. Patient has chronic sacral ulcer and has persistent pain at the site. Also complains of diffuse pruritus.  Past Medical History  Diagnosis Date  . Heart disease   . Atrial fibrillation (HCC)     WITH PACEMAKER  . OSA (obstructive sleep apnea)     does not use CPAP  . Memory loss   . Bleeding ulcer   . B12 deficiency   . Hypertension   . Abnormality of gait 05/03/2013  . Essential and other specified forms of tremor 05/03/2013  . Polyneuropathy in other diseases classified elsewhere (HCC) 05/03/2013  . Peripheral edema   . Varicose veins     Bilateral  . Macular degeneration of left eye   . Bilateral hydronephrosis 06/15/2014  . Bladder calculi 06/15/2014  . Multiple rib fractures 06/15/2014   Past Surgical History  Procedure Laterality Date  . Pacemaker insertion  2006  . Meningioma removal  1992    POST RT FRONTAL   . Cataract extraction Bilateral 2013  . Permanent pacemaker generator change N/A 08/13/2012    Procedure: PERMANENT PACEMAKER GENERATOR CHANGE;  Surgeon: Thurmon FairMihai Croitoru, MD;  Location: [facility] CATH LAB;  Service: Cardiovascular;  Laterality: N/A;  . Colostomy N/A 07/11/2014    Procedure: DIVERTING COLOSTOMY;  Surgeon: Dalia HeadingMark A Jenkins, MD;  Location: [facility] ORS;  Service: General;  Laterality: N/A;  wound class: clean contaminated  . Incision and drainage of wound N/A 07/11/2014    Procedure: DEBRIDEMENT OF SACRUM;  Surgeon: Dalia HeadingMark A Jenkins, MD;   Location: [facility] ORS;  Service: General;  Laterality: N/A;   Family History  Problem Relation Age of Onset  . Aortic aneurysm Mother   . Parkinsonism Mother   . Heart disease Father   . Cancer Sister     esaphageal cancer   Social History  Substance Use Topics  . Smoking status: Former Smoker -- 0.75 packs/day for 25 years    Types: Cigarettes  . Smokeless tobacco: Never Used     Comment: QUIT 35 YRS AGO  . Alcohol Use: No    Review of Systems  Constitutional: Negative for fever and chills.  Respiratory: Negative for shortness of breath.   Cardiovascular: Negative for chest pain.  Gastrointestinal: Negative for nausea, vomiting, abdominal pain and diarrhea.  Genitourinary: Positive for penile pain. Negative for dysuria, frequency, hematuria, flank pain, penile swelling, scrotal swelling, difficulty urinating and testicular pain.  Skin: Positive for rash and wound.  Neurological: Negative for weakness and numbness.  All other systems reviewed and are negative.     Allergies  Iohexol; Shellfish allergy; Dilantin; and Tegretol  Home Medications   Prior to Admission medications   Medication Sig Start Date End Date Taking? Authorizing Provider  ALPRAZolam Prudy Feeler(XANAX) 1 MG tablet Take 0.5 tablets (0.5 mg total) by mouth 2 (two) times daily as needed for anxiety. 06/18/14   Hollice EspySendil K Krishnan, MD  Amino Acids-Protein Hydrolys (FEEDING SUPPLEMENT, PRO-STAT SUGAR FREE 64,) LIQD Take 30 mLs by mouth 3 (three)  times daily with meals.    Historical Provider, MD  diltiazem (CARDIZEM) 60 MG tablet Take 1 tablet (60 mg total) by mouth daily. 03/23/15   Mihai Croitoru, MD  HYDROcodone-acetaminophen (NORCO/VICODIN) 5-325 MG per tablet Take 1 tablet by mouth every 6 (six) hours as needed for moderate pain. 07/12/14   Henderson Cloud, MD  levofloxacin (LEVAQUIN) 500 MG tablet Take 1 tablet (500 mg total) by mouth daily. 05/09/15   Loren Racer, MD  Multiple Vitamin (MULTIVITAMIN WITH  MINERALS) TABS tablet Take 1 tablet by mouth daily.    Historical Provider, MD  omeprazole (PRILOSEC) 20 MG capsule Take 20 mg by mouth daily.    Historical Provider, MD  sotalol (BETAPACE) 160 MG tablet TAKE 1 TABLET BY MOUTH TWICE DAILY. 05/02/15   Mihai Croitoru, MD   BP 109/74 mmHg  Pulse 60  Temp(Src) 97.5 F (36.4 C)  Resp 18  Wt 193 lb (87.544 kg)  SpO2 98% Physical Exam  Constitutional: He is oriented to person, place, and time. He appears well-developed and well-nourished. No distress.  Chronically ill-appearing  HENT:  Head: Normocephalic and atraumatic.  Mouth/Throat: Oropharynx is clear and moist. No oropharyngeal exudate.  Eyes: EOM are normal. Pupils are equal, round, and reactive to light.  Neck: Normal range of motion. Neck supple.  Cardiovascular: Normal rate and regular rhythm.  Exam reveals no gallop and no friction rub.   No murmur heard. Pulmonary/Chest: Effort normal and breath sounds normal. No respiratory distress. He has no wheezes. He has no rales. He exhibits no tenderness.  Abdominal: Soft. Bowel sounds are normal. He exhibits no distension and no mass. There is no tenderness. There is no rebound and no guarding.  Genitourinary:  Patient with full catheter in place. No obvious penile trauma. No testicular swelling or pain. Catheter is draining cloudy urine  Musculoskeletal: Normal range of motion. He exhibits no edema or tenderness.  Patient has a stage II sacral decubitus ulcer. There is no evidence of infection.  Neurological: He is alert and oriented to person, place, and time.  Move all extremities without deficit. Sensation is fully intact.  Skin: Skin is warm and dry. Rash noted. No erythema.  Patient has scaly dry rash covering the entire body. No erythema, warmth.  Psychiatric: He has a normal mood and affect. His behavior is normal.  Nursing note and vitals reviewed.   ED Course  Procedures (including critical care time) Labs Review Labs  Reviewed  URINALYSIS, ROUTINE W REFLEX MICROSCOPIC (NOT AT [facility]) - Abnormal; Notable for the following:    APPearance HAZY (*)    Hgb urine dipstick MODERATE (*)    Protein, ur 30 (*)    Leukocytes, UA LARGE (*)    All other components within normal limits  URINE MICROSCOPIC-ADD ON - Abnormal; Notable for the following:    Squamous Epithelial / LPF 0-5 (*)    Bacteria, UA MANY (*)    All other components within normal limits    Imaging Review No results found. I have personally reviewed and evaluated these images and lab results as part of my medical decision-making.   EKG Interpretation None      MDM   Final diagnoses:  Complicated UTI (urinary tract infection)   Catheter changed in the emergency department. Given first dose of antibiotics. We'll discharge home to follow-up with his primary physician. Return precautions have been given.     Loren Racer, MD 05/09/15 [phone]

## 2015-05-09 NOTE — Discharge Instructions (Signed)
Call and make an appointment to follow-up with your primary physician. Return immediately for fever, worsening pain, persistent vomiting or for any concerns.  Urinary Tract Infection Urinary tract infections (UTIs) can develop anywhere along your urinary tract. Your urinary tract is your body's drainage system for removing wastes and extra water. Your urinary tract includes two kidneys, two ureters, a bladder, and a urethra. Your kidneys are a pair of bean-shaped organs. Each kidney is about the size of your fist. They are located below your ribs, one on each side of your spine. CAUSES Infections are caused by microbes, which are microscopic organisms, including fungi, viruses, and bacteria. These organisms are so small that they can only be seen through a microscope. Bacteria are the microbes that most commonly cause UTIs. SYMPTOMS  Symptoms of UTIs may vary by age and gender of the patient and by the location of the infection. Symptoms in young women typically include a frequent and intense urge to urinate and a painful, burning feeling in the bladder or urethra during urination. Older women and men are more likely to be tired, shaky, and weak and have muscle aches and abdominal pain. A fever may mean the infection is in your kidneys. Other symptoms of a kidney infection include pain in your back or sides below the ribs, nausea, and vomiting. DIAGNOSIS To diagnose a UTI, your caregiver will ask you about your symptoms. Your caregiver will also ask you to provide a urine sample. The urine sample will be tested for bacteria and white blood cells. White blood cells are made by your body to help fight infection. TREATMENT  Typically, UTIs can be treated with medication. Because most UTIs are caused by a bacterial infection, they usually can be treated with the use of antibiotics. The choice of antibiotic and length of treatment depend on your symptoms and the type of bacteria causing your infection. HOME  CARE INSTRUCTIONS  If you were prescribed antibiotics, take them exactly as your caregiver instructs you. Finish the medication even if you feel better after you have only taken some of the medication.  Drink enough water and fluids to keep your urine clear or pale yellow.  Avoid caffeine, tea, and carbonated beverages. They tend to irritate your bladder.  Empty your bladder often. Avoid holding urine for long periods of time.  Empty your bladder before and after sexual intercourse.  After a bowel movement, women should cleanse from front to back. Use each tissue only once. SEEK MEDICAL CARE IF:   You have back pain.  You develop a fever.  Your symptoms do not begin to resolve within 3 days. SEEK IMMEDIATE MEDICAL CARE IF:   You have severe back pain or lower abdominal pain.  You develop chills.  You have nausea or vomiting.  You have continued burning or discomfort with urination. MAKE SURE YOU:   Understand these instructions.  Will watch your condition.  Will get help right away if you are not doing well or get worse.   This information is not intended to replace advice given to you by your health care provider. Make sure you discuss any questions you have with your health care provider.   Document Released: 03/13/2005 Document Revised: 02/22/2015 Document Reviewed: 07/12/2011 Elsevier Interactive Patient Education [employer]2016 [employer] Inc.

## 2015-05-09 NOTE — ED Notes (Signed)
Bladder Scanner read approximately 

## 2015-05-09 NOTE — ED Notes (Signed)
Patient recently had a urinary catheter by home health. Patient states "multiple attempts" since then patient has had increased pain in his penis and kidney pain.

## 2015-05-15 ENCOUNTER — Inpatient Hospital Stay (HOSPITAL_COMMUNITY)
Admission: EM | Admit: 2015-05-15 | Discharge: 2015-05-18 | DRG: 377 | Disposition: A | Discharge status: Home / Self Care | Payer: Medicare Other | Attending: Internal Medicine | Admitting: Internal Medicine

## 2015-05-15 ENCOUNTER — Inpatient Hospital Stay (HOSPITAL_COMMUNITY): Payer: Medicare Other

## 2015-05-15 ENCOUNTER — Encounter (HOSPITAL_COMMUNITY): Payer: Self-pay | Admitting: *Deleted

## 2015-05-15 DIAGNOSIS — L89154 Pressure ulcer of sacral region, stage 4: Secondary | ICD-10-CM | POA: Diagnosis not present

## 2015-05-15 DIAGNOSIS — R1030 Lower abdominal pain, unspecified: Secondary | ICD-10-CM | POA: Diagnosis present

## 2015-05-15 DIAGNOSIS — K2210 Ulcer of esophagus without bleeding: Secondary | ICD-10-CM | POA: Diagnosis present

## 2015-05-15 DIAGNOSIS — N390 Urinary tract infection, site not specified: Secondary | ICD-10-CM | POA: Diagnosis present

## 2015-05-15 DIAGNOSIS — Z79899 Other long term (current) drug therapy: Secondary | ICD-10-CM

## 2015-05-15 DIAGNOSIS — G629 Polyneuropathy, unspecified: Secondary | ICD-10-CM | POA: Diagnosis not present

## 2015-05-15 DIAGNOSIS — I10 Essential (primary) hypertension: Secondary | ICD-10-CM | POA: Diagnosis present

## 2015-05-15 DIAGNOSIS — I4891 Unspecified atrial fibrillation: Secondary | ICD-10-CM | POA: Diagnosis not present

## 2015-05-15 DIAGNOSIS — Z950 Presence of cardiac pacemaker: Secondary | ICD-10-CM | POA: Insufficient documentation

## 2015-05-15 DIAGNOSIS — R109 Unspecified abdominal pain: Secondary | ICD-10-CM

## 2015-05-15 DIAGNOSIS — I4581 Long QT syndrome: Secondary | ICD-10-CM | POA: Diagnosis not present

## 2015-05-15 DIAGNOSIS — K2960 Other gastritis without bleeding: Secondary | ICD-10-CM | POA: Diagnosis present

## 2015-05-15 DIAGNOSIS — R110 Nausea: Secondary | ICD-10-CM | POA: Diagnosis present

## 2015-05-15 DIAGNOSIS — K920 Hematemesis: Secondary | ICD-10-CM | POA: Diagnosis not present

## 2015-05-15 DIAGNOSIS — F419 Anxiety disorder, unspecified: Secondary | ICD-10-CM | POA: Diagnosis not present

## 2015-05-15 DIAGNOSIS — F0390 Unspecified dementia without behavioral disturbance: Secondary | ICD-10-CM | POA: Diagnosis present

## 2015-05-15 DIAGNOSIS — D72829 Elevated white blood cell count, unspecified: Secondary | ICD-10-CM | POA: Diagnosis present

## 2015-05-15 DIAGNOSIS — R531 Weakness: Secondary | ICD-10-CM | POA: Diagnosis not present

## 2015-05-15 DIAGNOSIS — K922 Gastrointestinal hemorrhage, unspecified: Secondary | ICD-10-CM

## 2015-05-15 DIAGNOSIS — I480 Paroxysmal atrial fibrillation: Secondary | ICD-10-CM | POA: Diagnosis not present

## 2015-05-15 DIAGNOSIS — Z933 Colostomy status: Secondary | ICD-10-CM

## 2015-05-15 DIAGNOSIS — I495 Sick sinus syndrome: Secondary | ICD-10-CM | POA: Insufficient documentation

## 2015-05-15 DIAGNOSIS — R9431 Abnormal electrocardiogram [ECG] [EKG]: Secondary | ICD-10-CM | POA: Insufficient documentation

## 2015-05-15 HISTORY — DX: Polyneuropathy, unspecified: G62.9

## 2015-05-15 HISTORY — DX: Paroxysmal atrial fibrillation: I48.0

## 2015-05-15 HISTORY — DX: Essential (primary) hypertension: I10

## 2015-05-15 HISTORY — DX: Essential tremor: G25.0

## 2015-05-15 HISTORY — DX: Sick sinus syndrome: I49.5

## 2015-05-15 LAB — CBC WITH DIFFERENTIAL/PLATELET
BASOS ABS: 0 10*3/uL (ref 0.0–0.1)
Basophils Relative: 0 %
EOS PCT: 1 %
Eosinophils Absolute: 0.2 10*3/uL (ref 0.0–0.7)
HCT: 40.7 % (ref 39.0–52.0)
Hemoglobin: 14 g/dL (ref 13.0–17.0)
LYMPHS ABS: 1 10*3/uL (ref 0.7–4.0)
Lymphocytes Relative: 5 %
MCH: 28.6 pg (ref 26.0–34.0)
MCHC: 34.4 g/dL (ref 30.0–36.0)
MCV: 83.1 fL (ref 78.0–100.0)
Monocytes Absolute: 0.7 10*3/uL (ref 0.1–1.0)
Monocytes Relative: 4 %
NEUTROS ABS: 17.2 10*3/uL — AB (ref 1.7–7.7)
Neutrophils Relative %: 90 %
Platelets: 301 10*3/uL (ref 150–400)
RBC: 4.9 MIL/uL (ref 4.22–5.81)
RDW: 14.1 % (ref 11.5–15.5)
WBC: 19 10*3/uL — AB (ref 4.0–10.5)

## 2015-05-15 LAB — CBC
HCT: 37.9 % — ABNORMAL LOW (ref 39.0–52.0)
HEMATOCRIT: 35.1 % — AB (ref 39.0–52.0)
Hemoglobin: 13 g/dL (ref 13.0–17.0)
Hemoglobin: 11.8 g/dL — ABNORMAL LOW (ref 13.0–17.0)
MCH: 28.6 pg (ref 26.0–34.0)
MCH: 28 pg (ref 26.0–34.0)
MCHC: 34.3 g/dL (ref 30.0–36.0)
MCHC: 33.6 g/dL (ref 30.0–36.0)
MCV: 83.3 fL (ref 78.0–100.0)
MCV: 83.4 fL (ref 78.0–100.0)
PLATELETS: 238 10*3/uL (ref 150–400)
PLATELETS: 207 10*3/uL (ref 150–400)
RBC: 4.55 MIL/uL (ref 4.22–5.81)
RBC: 4.21 MIL/uL — AB (ref 4.22–5.81)
RDW: 14.3 % (ref 11.5–15.5)
RDW: 14.4 % (ref 11.5–15.5)
WBC: 12.8 10*3/uL — ABNORMAL HIGH (ref 4.0–10.5)
WBC: 11.8 10*3/uL — AB (ref 4.0–10.5)

## 2015-05-15 LAB — COMPREHENSIVE METABOLIC PANEL
ALT: 10 U/L — AB (ref 17–63)
AST: 17 U/L (ref 15–41)
Albumin: 3.4 g/dL — ABNORMAL LOW (ref 3.5–5.0)
Alkaline Phosphatase: 81 U/L (ref 38–126)
Anion gap: 12 (ref 5–15)
BUN: 17 mg/dL (ref 6–20)
CALCIUM: 9.3 mg/dL (ref 8.9–10.3)
CO2: 29 mmol/L (ref 22–32)
CREATININE: 1.01 mg/dL (ref 0.61–1.24)
Chloride: 97 mmol/L — ABNORMAL LOW (ref 101–111)
GFR calc Af Amer: >60 mL/min (ref >60)
Glucose, Bld: 148 mg/dL — ABNORMAL HIGH (ref 65–99)
Potassium: 3.5 mmol/L (ref 3.5–5.1)
Sodium: 138 mmol/L (ref 135–145)
Total Bilirubin: 1 mg/dL (ref 0.3–1.2)
Total Protein: 7.1 g/dL (ref 6.5–8.1)

## 2015-05-15 LAB — URINALYSIS, ROUTINE W REFLEX MICROSCOPIC
Bilirubin Urine: NEGATIVE
Glucose, UA: NEGATIVE mg/dL
KETONES UR: NEGATIVE mg/dL
NITRITE: POSITIVE — AB
PH: 6.5 (ref 5.0–8.0)
PROTEIN: 30 mg/dL — AB
Specific Gravity, Urine: 1.025 (ref 1.005–1.030)

## 2015-05-15 LAB — LACTIC ACID, PLASMA
LACTIC ACID, VENOUS: 1.9 mmol/L (ref 0.5–2.0)
Lactic Acid, Venous: 1.3 mmol/L (ref 0.5–2.0)

## 2015-05-15 LAB — TYPE AND SCREEN
ABO/RH(D): A POS
Antibody Screen: NEGATIVE

## 2015-05-15 LAB — OCCULT BLOOD GASTRIC / DUODENUM (SPECIMEN CUP)
Occult Blood, Gastric: NEGATIVE
PH, GASTRIC: 3

## 2015-05-15 LAB — URINE MICROSCOPIC-ADD ON

## 2015-05-15 LAB — LIPASE, BLOOD: LIPASE: 34 U/L (ref 11–51)

## 2015-05-15 MED ORDER — ACETAMINOPHEN 325 MG PO TABS: 650.0000 mg | ORAL_TABLET | Freq: Four times a day (QID) | ORAL | Status: DC | PRN

## 2015-05-15 MED ORDER — ONDANSETRON HCL 4 MG/2ML IJ SOLN: 4.0000 mg | Freq: Four times a day (QID) | INTRAMUSCULAR | Status: DC | PRN

## 2015-05-15 MED ORDER — ONDANSETRON HCL 4 MG/2ML IJ SOLN
4.0000 mg | Freq: Once | INTRAMUSCULAR | Status: AC
  Administered 2015-05-15: 4 mg via INTRAMUSCULAR
  Filled 2015-05-15: qty 2

## 2015-05-15 MED ORDER — MORPHINE SULFATE (PF) 2 MG/ML IV SOLN: 2.0000 mg | INTRAVENOUS | Status: DC | PRN

## 2015-05-15 MED ORDER — PANTOPRAZOLE SODIUM 40 MG IV SOLR
40.0000 mg | Freq: Once | INTRAVENOUS | Status: AC
  Administered 2015-05-15: 40 mg via INTRAVENOUS
  Filled 2015-05-15: qty 40

## 2015-05-15 MED ORDER — MORPHINE SULFATE (PF) 4 MG/ML IV SOLN
4.0000 mg | Freq: Once | INTRAVENOUS | Status: AC
Start: 2015-05-15 — End: 2015-05-15
  Administered 2015-05-15: 4 mg via INTRAVENOUS
  Filled 2015-05-15: qty 1

## 2015-05-15 MED ORDER — ONDANSETRON HCL 4 MG/2ML IJ SOLN
4.0000 mg | Freq: Once | INTRAMUSCULAR | Status: DC
  Filled 2015-05-15: qty 2

## 2015-05-15 MED ORDER — ACETAMINOPHEN 650 MG RE SUPP: 650.0000 mg | Freq: Four times a day (QID) | RECTAL | Status: DC | PRN

## 2015-05-15 MED ORDER — SODIUM CHLORIDE 0.9 % IJ SOLN
3.0000 mL | Freq: Two times a day (BID) | INTRAMUSCULAR | Status: DC
  Administered 2015-05-15 – 2015-05-18 (×5): 3 mL via INTRAVENOUS

## 2015-05-15 MED ORDER — LEVOFLOXACIN IN D5W 500 MG/100ML IV SOLN
500.0000 mg | INTRAVENOUS | Status: DC
  Administered 2015-05-15: 500 mg via INTRAVENOUS
  Filled 2015-05-15: qty 100

## 2015-05-15 MED ORDER — ONDANSETRON HCL 4 MG PO TABS: 4.0000 mg | ORAL_TABLET | Freq: Four times a day (QID) | ORAL | Status: DC | PRN

## 2015-05-15 MED ORDER — SODIUM CHLORIDE 0.9 % IV SOLN
INTRAVENOUS | Status: DC
  Administered 2015-05-15 – 2015-05-16 (×3): via INTRAVENOUS

## 2015-05-15 MED ORDER — ALPRAZOLAM 0.5 MG PO TABS
0.5000 mg | ORAL_TABLET | Freq: Two times a day (BID) | ORAL | Status: DC | PRN
  Administered 2015-05-17: 0.5 mg via ORAL
  Filled 2015-05-15: qty 1

## 2015-05-15 MED ORDER — HYDROCODONE-ACETAMINOPHEN 10-325 MG PO TABS
1.0000 | ORAL_TABLET | Freq: Four times a day (QID) | ORAL | Status: DC | PRN
  Administered 2015-05-17: 1 via ORAL
  Filled 2015-05-15: qty 1

## 2015-05-15 MED ORDER — DIPHENHYDRAMINE HCL 25 MG PO CAPS
25.0000 mg | ORAL_CAPSULE | Freq: Four times a day (QID) | ORAL | Status: DC | PRN
  Administered 2015-05-15 – 2015-05-18 (×7): 25 mg via ORAL
  Filled 2015-05-15 (×7): qty 1

## 2015-05-15 MED ORDER — PANTOPRAZOLE SODIUM 40 MG IV SOLR
40.0000 mg | Freq: Two times a day (BID) | INTRAVENOUS | Status: DC
  Administered 2015-05-15 – 2015-05-16 (×2): 40 mg via INTRAVENOUS
  Filled 2015-05-15 (×2): qty 40

## 2015-05-15 NOTE — ED Notes (Signed)
Jon [name] [phone]

## 2015-05-15 NOTE — ED Notes (Signed)
Patient vomited approx 300 cc of brown liquid upon arrival

## 2015-05-15 NOTE — ED Provider Notes (Signed)
CSN: [id number]     Arrival date & time 05/15/15  4782 History   First MD Initiated Contact with Patient 05/15/15 [phone]     Chief Complaint  Patient presents with  . Vomiting     (Consider location/radiation/quality/duration/timing/severity/associated sxs/prior Treatment) The history is provided by the patient.   patient presents with nausea vomiting and hematemesis. Began last night. States he feels little bit fatigued. Previous history of bleeding ulcers. No diarrhea. He is not on anticoagulation. He has a Foley catheter and a colostomy. He is on Levaquin for urinary tract infection. Reportedly cultures have been sent. No sick contacts. Mild lower abdominal pain. Reportedly vomited bright red blood but the emesis in the ER is a mixture of coffee-ground and purplish blood. Patient had mildly decreased colostomy output.  Past Medical History  Diagnosis Date  . Heart disease   . Atrial fibrillation (HCC)     WITH PACEMAKER  . OSA (obstructive sleep apnea)     does not use CPAP  . Memory loss   . Bleeding ulcer   . B12 deficiency   . Hypertension   . Abnormality of gait 05/03/2013  . Essential and other specified forms of tremor 05/03/2013  . Polyneuropathy in other diseases classified elsewhere (HCC) 05/03/2013  . Peripheral edema   . Varicose veins     Bilateral  . Macular degeneration of left eye   . Bilateral hydronephrosis 06/15/2014  . Bladder calculi 06/15/2014  . Multiple rib fractures 06/15/2014   Past Surgical History  Procedure Laterality Date  . Pacemaker insertion  2006  . Meningioma removal  1992    POST RT FRONTAL   . Cataract extraction Bilateral 2013  . Permanent pacemaker generator change N/A 08/13/2012    Procedure: PERMANENT PACEMAKER GENERATOR CHANGE;  Surgeon: Thurmon Fair, MD;  Location: [facility];  Service: Cardiovascular;  Laterality: N/A;  . Colostomy N/A 07/11/2014    Procedure: DIVERTING COLOSTOMY;  Surgeon: Dalia Heading, MD;  Location: [facility];  Service: General;  Laterality: N/A;  wound class: clean contaminated  . Incision and drainage of wound N/A 07/11/2014    Procedure: DEBRIDEMENT OF SACRUM;  Surgeon: Dalia Heading, MD;  Location: [facility] OR[facility];  Service: General;  Laterality: N/A;   Family History  Problem Relation Age of Onset  . Aortic aneurysm Mother   . Parkinsonism Mother   . Heart disease Father   . Cancer Sister     esaphageal cancer   Social History  Substance Use Topics  . Smoking status: Former Smoker -- 0.75 packs/day for 25 years    Types: Cigarettes  . Smokeless tobacco: Never Used     Comment: QUIT 35 YRS AGO  . Alcohol Use: No    Review of Systems  Constitutional: Positive for appetite change and fatigue. Negative for activity change.  Eyes: Negative for pain.  Respiratory: Negative for chest tightness and shortness of breath.   Cardiovascular: Negative for chest pain and leg swelling.  Gastrointestinal: Positive for vomiting. Negative for nausea, abdominal pain and diarrhea.  Genitourinary: Negative for flank pain.  Musculoskeletal: Negative for back pain and neck stiffness.  Skin: Negative for pallor and rash.  Neurological: Negative for weakness, numbness and headaches.  Psychiatric/Behavioral: Negative for behavioral problems.      Allergies  Iohexol; Shellfish allergy; Dilantin; and Tegretol  Home Medications   Prior to Admission medications   Medication Sig Start Date End Date Taking? Authorizing Provider  ALPRAZolam Prudy Feeler) 1 MG tablet Take 0.5  tablets (0.5 mg total) by mouth 2 (two) times daily as needed for anxiety. 06/18/14  Yes Hollice Espy, MD  Amino Acids-Protein Hydrolys (FEEDING SUPPLEMENT, PRO-STAT SUGAR FREE 64,) LIQD Take 30 mLs by mouth 3 (three) times daily with meals.   Yes Historical Provider, MD  diphenhydrAMINE (BENADRYL) 25 MG tablet Take 25 mg by mouth every 6 (six) hours as needed for itching.   Yes Historical Provider, MD  HYDROcodone-acetaminophen (NORCO)  10-325 MG tablet Take 1 tablet by mouth every 6 (six) hours as needed. 05/04/15  Yes Historical Provider, MD  levofloxacin (LEVAQUIN) 500 MG tablet Take 1 tablet (500 mg total) by mouth daily. 05/09/15  Yes Loren Racer, MD  Multiple Vitamin (MULTIVITAMIN WITH MINERALS) TABS tablet Take 1 tablet by mouth daily.   Yes Historical Provider, MD  Multiple Vitamins-Minerals (ICAPS AREDS FORMULA PO) Take 1 tablet by mouth daily.   Yes Historical Provider, MD  sotalol (BETAPACE) 160 MG tablet TAKE 1 TABLET BY MOUTH TWICE DAILY. 05/02/15  Yes Mihai Croitoru, MD  Tetrahydrozoline HCl (EYE DROPS OP) Apply 2 drops to eye daily as needed (irritation).   Yes Historical Provider, MD   BP 137/92 mmHg  Pulse 80  Temp(Src) 97.9 F (36.6 C) (Oral)  Resp 16  Ht 5' 9.5" (1.765 m)  Wt 155 lb (70.308 kg)  BMI 22.57 kg/m2  SpO2 96% Physical Exam  Constitutional: He appears well-developed.  HENT:  Head: Normocephalic.  Neck: Neck supple.  Cardiovascular: Normal rate.   Pulmonary/Chest: Effort normal.  Abdominal: There is tenderness.  Mild epigastric tenderness without rebound or guarding. Colostomy in left lower quadrant with brown stool in bag.  Musculoskeletal: Normal range of motion.  Neurological: He is alert.  Skin: Skin is warm.  Psychiatric: He has a normal mood and affect.    ED Course  Procedures (including critical care time) Labs Review Labs Reviewed  CBC WITH DIFFERENTIAL/PLATELET - Abnormal; Notable for the following:    WBC 19.0 (*)    Neutro Abs 17.2 (*)    All other components within normal limits  COMPREHENSIVE METABOLIC PANEL - Abnormal; Notable for the following:    Chloride 97 (*)    Glucose, Bld 148 (*)    Albumin 3.4 (*)    ALT 10 (*)    All other components within normal limits  LACTIC ACID, PLASMA  LACTIC ACID, PLASMA  OCCULT BLOOD GASTRIC / DUODENUM (SPECIMEN CUP)  LIPASE, BLOOD  TYPE AND SCREEN    Imaging Review Dg Abd Acute W/chest  05/15/2015  CLINICAL  DATA:  One day history abdominal pain and hematemesis EXAM: DG ABDOMEN ACUTE W/ 1V CHEST COMPARISON:  Chest radiograph July 05, 2014; CT abdomen and pelvis June 15, 2014 FINDINGS: PA chest: There is no edema or consolidation. The heart is upper normal in size with pulmonary vascularity within normal limits. There is atherosclerotic calcification in the aorta. Pacemaker leads are attached to the right atrium and right ventricle. No pneumothorax. No adenopathy. Supine and upright abdomen: Stomach is distended with air. There is no appreciable small bowel are large bowel dilatation. No free air. There are phleboliths in the pelvis. There is degenerative change in the lumbar spine. There is moderate stool in the colon. IMPRESSION: Stomach distended with air. No small or large bowel obstruction. No free air. No lung edema or consolidation. Electronically Signed   By: Bretta Bang III M.D.   On: 05/15/2015 14:10   I have personally reviewed and evaluated these images and lab results  as part of my medical decision-making.   EKG Interpretation   Date/Time:  Monday May 15 2015 07:00:21 EST Ventricular Rate:  68 PR Interval:  172 QRS Duration: 106 QT Interval:  606 QTC Calculation: 645 R Axis:   67 Text Interpretation:  Sinus rhythm RSR' in V1 or V2, right VCD or RVH  Abnormal lateral Q waves Prolonged QT interval Confirmed by Rubin PayorPICKERING  MD,  Rease Swinson [phone]([id number]) on 05/15/2015 7:47:28 AM      MDM   Final diagnoses:  Hematemesis with nausea    Patient with hematemesis. Had apparent blood on the viewed emesis, however there was not positive gastric occult. Also has some abdominal pain and elevated white count. Will admit for monitoring.    Benjiman CoreNathan Fatma Rutten, MD 05/15/15 [id number]

## 2015-05-15 NOTE — ED Notes (Signed)
Pt c/o n/v x 1 day; pt is vomiting some bright red blood

## 2015-05-15 NOTE — ED Notes (Signed)
Patient transported to X-ray 

## 2015-05-15 NOTE — H&P (Signed)
History and Physical  Jon [name] BJY:[id number] DOB: 03-08-1932 DOA: 05/15/2015  Referring physician:Nathan Rubin Payor, MD  PCP: Milana Obey, MD   Chief Complaint: Vomiting   HPI:  49 yom with medical history of Afib, Dementia, foley catheter and colostomy  and polyneuropathy presented with complaints of nausea, and hematemesis. Last night after eating dinner he reports increased nausea and vomiting with what appeared to be blood mixed within. Admits weakness, lower abdominal pain rated at 7/10 but resolved with pain medications. Uses a wheelchair as needed. Denies any rectal bleeding, fever, changes to fever, sore throat, new muscle aches, dysuria, chest pain, SOB or diarrhea. Noted to be on Levaquin since 11/24 for UTI, cultures have been sent by PCP. Reports recent rash for several months, associated itching relieved with Benadryl. While in the ED, noted to have multiple episodes of vomiting coffee-ground emesis with positive gastric occult. Admitted for GI consult and further treatment.    In the emergency department afebrile, VSS, No hypoxia  Pertinent labs: lactic acid 1.9; WBC 19.0 remainder CBC unremarkable; UA positive for UTI,  BMP unremarkable; Hgb 14.0 EKG: Independently reviewed. SR, prolonged QT, ST changes anterolaterally, old Imaging: pending  Review of Systems:  Positive for abdominal pain, nausea, vomiting, rash, weakness Negative for fever, visual changes, sore throat, new muscle aches, chest pain, SOB, dysuria, bleeding,   Past Medical History  Diagnosis Date  . Heart disease   . Atrial fibrillation (HCC)     WITH PACEMAKER  . OSA (obstructive sleep apnea)     does not use CPAP  . Memory loss   . Bleeding ulcer   . B12 deficiency   . Hypertension   . Abnormality of gait 05/03/2013  . Essential and other specified forms of tremor 05/03/2013  . Polyneuropathy in other diseases classified elsewhere (HCC) 05/03/2013  . Peripheral edema   . Varicose  veins     Bilateral  . Macular degeneration of left eye   . Bilateral hydronephrosis 06/15/2014  . Bladder calculi 06/15/2014  . Multiple rib fractures 06/15/2014    Past Surgical History  Procedure Laterality Date  . Pacemaker insertion  2006  . Meningioma removal  1992    POST RT FRONTAL   . Cataract extraction Bilateral 2013  . Permanent pacemaker generator change N/A 08/13/2012    Procedure: PERMANENT PACEMAKER GENERATOR CHANGE;  Surgeon: Thurmon Fair, MD;  Location: [facility];  Service: Cardiovascular;  Laterality: N/A;  . Colostomy N/A 07/11/2014    Procedure: DIVERTING COLOSTOMY;  Surgeon: Dalia Heading, MD;  Location: [facility];  Service: General;  Laterality: N/A;  wound class: clean contaminated  . Incision and drainage of wound N/A 07/11/2014    Procedure: DEBRIDEMENT OF SACRUM;  Surgeon: Dalia Heading, MD;  Location: [facility];  Service: General;  Laterality: N/A;    Social History:  reports that he has quit smoking. His smoking use included Cigarettes. He has a 18.75 pack-year smoking history. He has never used smokeless tobacco. He reports that he does not drink alcohol or use illicit drugs. lives with their family Partial assistance  Allergies  Allergen Reactions  . Iohexol      Desc: PT STATES THROAT/FACE SWELLING W/ IVP DYE IN 1990. PT HAS NOT HAD ANY EXAMS DONE W/DYE SINCE AND HAS NEVER BEEN PRE MEDICATED. PER PT, Onset Date: [id number]   . Shellfish Allergy   . Dilantin [Phenytoin Sodium Extended] Rash  . Tegretol [Carbamazepine] Rash    Family History  Problem Relation  Age of Onset  . Aortic aneurysm Mother   . Parkinsonism Mother   . Heart disease Father   . Cancer Sister     esaphageal cancer     Prior to Admission medications   Medication Sig Start Date End Date Taking? Authorizing Provider  ALPRAZolam Prudy Feeler(XANAX) 1 MG tablet Take 0.5 tablets (0.5 mg total) by mouth 2 (two) times daily as needed for anxiety. 06/18/14  Yes Hollice EspySendil K Krishnan, MD  Amino  Acids-Protein Hydrolys (FEEDING SUPPLEMENT, PRO-STAT SUGAR FREE 64,) LIQD Take 30 mLs by mouth 3 (three) times daily with meals.   Yes Historical Provider, MD  diphenhydrAMINE (BENADRYL) 25 MG tablet Take 25 mg by mouth every 6 (six) hours as needed for itching.   Yes Historical Provider, MD  HYDROcodone-acetaminophen (NORCO) 10-325 MG tablet Take 1 tablet by mouth every 6 (six) hours as needed. 05/04/15  Yes Historical Provider, MD  levofloxacin (LEVAQUIN) 500 MG tablet Take 1 tablet (500 mg total) by mouth daily. 05/09/15  Yes Loren Raceravid Yelverton, MD  Multiple Vitamin (MULTIVITAMIN WITH MINERALS) TABS tablet Take 1 tablet by mouth daily.   Yes Historical Provider, MD  Multiple Vitamins-Minerals (ICAPS AREDS FORMULA PO) Take 1 tablet by mouth daily.   Yes Historical Provider, MD  sotalol (BETAPACE) 160 MG tablet TAKE 1 TABLET BY MOUTH TWICE DAILY. 05/02/15  Yes Mihai Croitoru, MD  Tetrahydrozoline HCl (EYE DROPS OP) Apply 2 drops to eye daily as needed (irritation).   Yes Historical Provider, MD  diltiazem (CARDIZEM) 60 MG tablet Take 1 tablet (60 mg total) by mouth daily. Patient not taking: Reported on 05/15/2015 03/23/15   Thurmon FairMihai Croitoru, MD  HYDROcodone-acetaminophen (NORCO/VICODIN) 5-325 MG per tablet Take 1 tablet by mouth every 6 (six) hours as needed for moderate pain. Patient not taking: Reported on 05/15/2015 07/12/14   Henderson CloudEstela Y Hernandez Acosta, MD  omeprazole (PRILOSEC) 20 MG capsule Take 20 mg by mouth daily.    Historical Provider, MD   Physical Exam: Filed Vitals:   05/15/15 0700 05/15/15 0730 05/15/15 0800 05/15/15 0830  BP: 157/97 138/93 141/93 141/93  Pulse: 66 66 69 80  Temp:      TempSrc:      Resp: 18 17 18 17   Height:      Weight:      SpO2: 98% 94% 95% 95%    General:  Appears calm and comfortable.  Eyes: PERRL, normal lids, irises & conjunctiva ENT: grossly normal hearing, lips & tongue Neck: no LAD, masses or thyromegaly Cardiovascular: RRR, no m/r/g. No LE  edema. Respiratory: CTA bilaterally, no w/r/r. Normal respiratory effort. Abdomen: Colostomy LLQ with brown stool. Foley catheter present. Soft. Non-distended abdomen Skin:  Erythematous rash on chest and arm. Leg dry with dry with chronic venous stasis changes.  Musculoskeletal: grossly normal tone BUE/BLE Psychiatric: grossly normal mood and affect, speech fluent and appropriate Neurologic: grossly non-focal.  Wt Readings from Last 3 Encounters:  05/15/15 70.308 kg (155 lb)  05/09/15 87.544 kg (193 lb)  07/08/14 87.907 kg (193 lb 12.8 oz)    Labs on Admission:  Basic Metabolic Panel:  Recent Labs Lab 05/15/15 0713  NA 138  K 3.5  CL 97*  CO2 29  GLUCOSE 148*  BUN 17  CREATININE 1.01  CALCIUM 9.3    Liver Function Tests:  Recent Labs Lab 05/15/15 0713  AST 17  ALT 10*  ALKPHOS 81  BILITOT 1.0  PROT 7.1  ALBUMIN 3.4*    Recent Labs Lab 05/15/15 0820  LIPASE  34   CBC:  Recent Labs Lab 05/15/15 0713  WBC 19.0*  NEUTROABS 17.2*  HGB 14.0  HCT 40.7  MCV 83.1  PLT 301     Principal Problem:   Hematemesis Active Problems:   Atrial fibrillation (HCC)   UTI (lower urinary tract infection)   GIB (gastrointestinal bleeding)   Assessment/Plan 1. Suspected hematemesis with generalized abd pain and remote h/o PUD. Hgb WNL; no obvious bleeding. NPO until GI consult. Leukocytosis likely stress no reaction, no fever or h/o to suggest systemic infection. 2. UTI; present upon admission. Finish Levaquin. Chronic indwelling foley. 3. Afib, stable on sotalol. 4. Essential HTN 5. Anxiety, Xanax PRN     Appears stable; Hgb WNL and BP stable. Plan admit to medical bed  Consult GI, trend CBC, PPI, NPO for now, check AXR  Code Status: Full  DVT prophylaxis:SCDs  Family Communication: Discussed with patient who understands and has no concerns at this time. Disposition Plan/Anticipated LOS: Admit to medical bed. Anticipate discharge within 2-3 days.   Time  spent: 35 minutes  Brendia Sacks, MD  [facility] Pager [phone] 05/15/2015, 9:25 AM   By signing my name below, I, Zadie Cleverly attest that this documentation has been prepared under the direction and in the presence of Brendia Sacks, MD Electronically signed: Zadie Cleverly  05/15/2015 1:11pm   I personally performed the services described in this documentation. All medical record entries made by the scribe were at my direction. I have reviewed the chart and agree that the record reflects my personal performance and is accurate and complete. Brendia Sacks, MD

## 2015-05-15 NOTE — ED Notes (Signed)
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 Home health nursing changes dressing 3 days per week and son does on other days

## 2015-05-15 NOTE — ED Provider Notes (Signed)
MSE was initiated and I personally evaluated the patient and placed orders (if any) at  6:45 AM on May 15, 2015.  The patient appears stable so that the remainder of the MSE may be completed by another provider.  I was called to the bedside as patient noted to be actively vomiting coffee-ground emesis. Per the patient, he has had nausea and vomiting for one day. Since last night at dinner. History of peptic ulcer disease secondary to NSAIDs. Currently not on any blood thinners or NSAIDs. Has a colostomy. Denies any abdominal pain but reports up to 7 episodes of vomiting coffee ground emesis.  Hemodynamically stable. Nontoxic on exam. Gastric occult is positive at the bedside. 2 large-bore IVs to be established. Patient nothing by mouth. CBC, BMP, type and screen, lactate obtained. Patient's GI doctor is Dr. Karilyn Cotaehman.  Shon Batonourtney F Mecca Guitron, MD 05/15/15 [phone]

## 2015-05-15 NOTE — Progress Notes (Signed)
Pt has a stage 4 sacrum wound. Pt's family member stated that they have been using Alginate and a foam dressing for dressing changes. Pt did not have wound orders at the time. MD notified and RN requested to change dressing with the alginate and foam dressing and possible wound consult. MD aware and verified orders. Wound changed and Consult is in book. Lesly Dukesachel J Everett, RN

## 2015-05-16 ENCOUNTER — Encounter (HOSPITAL_COMMUNITY)
Admission: EM | Disposition: A | Discharge status: Home / Self Care | Payer: Medicare Other | Source: Home / Self Care | Attending: Family Medicine

## 2015-05-16 ENCOUNTER — Encounter (HOSPITAL_COMMUNITY): Payer: Self-pay | Admitting: Cardiology

## 2015-05-16 DIAGNOSIS — K922 Gastrointestinal hemorrhage, unspecified: Secondary | ICD-10-CM | POA: Diagnosis not present

## 2015-05-16 DIAGNOSIS — I4581 Long QT syndrome: Secondary | ICD-10-CM | POA: Diagnosis not present

## 2015-05-16 DIAGNOSIS — Z950 Presence of cardiac pacemaker: Secondary | ICD-10-CM | POA: Diagnosis not present

## 2015-05-16 DIAGNOSIS — R110 Nausea: Secondary | ICD-10-CM | POA: Diagnosis not present

## 2015-05-16 DIAGNOSIS — K920 Hematemesis: Secondary | ICD-10-CM | POA: Diagnosis not present

## 2015-05-16 DIAGNOSIS — I480 Paroxysmal atrial fibrillation: Secondary | ICD-10-CM | POA: Insufficient documentation

## 2015-05-16 DIAGNOSIS — I495 Sick sinus syndrome: Secondary | ICD-10-CM | POA: Insufficient documentation

## 2015-05-16 DIAGNOSIS — R9431 Abnormal electrocardiogram [ECG] [EKG]: Secondary | ICD-10-CM | POA: Insufficient documentation

## 2015-05-16 HISTORY — PX: ESOPHAGOGASTRODUODENOSCOPY: SHX5428

## 2015-05-16 LAB — COMPREHENSIVE METABOLIC PANEL
ALBUMIN: 2.7 g/dL — AB (ref 3.5–5.0)
ALT: 9 U/L — ABNORMAL LOW (ref 17–63)
ANION GAP: 9 (ref 5–15)
AST: 13 U/L — ABNORMAL LOW (ref 15–41)
Alkaline Phosphatase: 58 U/L (ref 38–126)
BILIRUBIN TOTAL: 0.7 mg/dL (ref 0.3–1.2)
BUN: 17 mg/dL (ref 6–20)
CO2: 27 mmol/L (ref 22–32)
Calcium: 8.3 mg/dL — ABNORMAL LOW (ref 8.9–10.3)
Chloride: 102 mmol/L (ref 101–111)
Creatinine, Ser: 0.88 mg/dL (ref 0.61–1.24)
GFR calc non Af Amer: >60 mL/min (ref >60)
GLUCOSE: 93 mg/dL (ref 65–99)
POTASSIUM: 3.4 mmol/L — AB (ref 3.5–5.1)
SODIUM: 138 mmol/L (ref 135–145)
TOTAL PROTEIN: 5.7 g/dL — AB (ref 6.5–8.1)

## 2015-05-16 LAB — CBC
HEMATOCRIT: 35.5 % — AB (ref 39.0–52.0)
Hemoglobin: 11.6 g/dL — ABNORMAL LOW (ref 13.0–17.0)
MCH: 27.8 pg (ref 26.0–34.0)
MCHC: 32.7 g/dL (ref 30.0–36.0)
MCV: 84.9 fL (ref 78.0–100.0)
Platelets: 238 10*3/uL (ref 150–400)
RBC: 4.18 MIL/uL — ABNORMAL LOW (ref 4.22–5.81)
RDW: 14.4 % (ref 11.5–15.5)
WBC: 10 10*3/uL (ref 4.0–10.5)

## 2015-05-16 LAB — MAGNESIUM: MAGNESIUM: 1.5 mg/dL — AB (ref 1.7–2.4)

## 2015-05-16 LAB — MRSA PCR SCREENING: MRSA by PCR: POSITIVE — AB

## 2015-05-16 SURGERY — EGD (ESOPHAGOGASTRODUODENOSCOPY)
Anesthesia: Moderate Sedation

## 2015-05-16 MED ORDER — LIDOCAINE VISCOUS 2 % MT SOLN
OROMUCOSAL | Status: AC
  Filled 2015-05-16: qty 15

## 2015-05-16 MED ORDER — MIDAZOLAM HCL 5 MG/5ML IJ SOLN
INTRAMUSCULAR | Status: AC
  Filled 2015-05-16: qty 10

## 2015-05-16 MED ORDER — CHLORHEXIDINE GLUCONATE CLOTH 2 % EX PADS
6.0000 | MEDICATED_PAD | Freq: Every day | CUTANEOUS | Status: DC
  Administered 2015-05-16 – 2015-05-18 (×3): 6 via TOPICAL

## 2015-05-16 MED ORDER — STERILE WATER FOR IRRIGATION IR SOLN
Status: DC | PRN
  Administered 2015-05-16: 16:00:00

## 2015-05-16 MED ORDER — POTASSIUM CHLORIDE 10 MEQ/100ML IV SOLN: 10.0000 meq | INTRAVENOUS | Status: DC

## 2015-05-16 MED ORDER — MUPIROCIN 2 % EX OINT
1.0000 "application " | TOPICAL_OINTMENT | Freq: Two times a day (BID) | CUTANEOUS | Status: DC
  Administered 2015-05-16 – 2015-05-18 (×6): 1 via NASAL
  Filled 2015-05-16 (×2): qty 22

## 2015-05-16 MED ORDER — SODIUM CHLORIDE 0.9 % IV SOLN
INTRAVENOUS | Status: DC
  Administered 2015-05-16 – 2015-05-17 (×3): via INTRAVENOUS

## 2015-05-16 MED ORDER — MAGNESIUM SULFATE 2 GM/50ML IV SOLN
2.0000 g | Freq: Once | INTRAVENOUS | Status: AC
  Administered 2015-05-16: 2 g via INTRAVENOUS
  Filled 2015-05-16: qty 50

## 2015-05-16 MED ORDER — POTASSIUM CHLORIDE 10 MEQ/100ML IV SOLN
10.0000 meq | INTRAVENOUS | Status: AC
  Administered 2015-05-16 (×2): 10 meq via INTRAVENOUS
  Filled 2015-05-16: qty 100

## 2015-05-16 MED ORDER — PANTOPRAZOLE SODIUM 40 MG PO TBEC
40.0000 mg | DELAYED_RELEASE_TABLET | Freq: Two times a day (BID) | ORAL | Status: DC
  Administered 2015-05-16 – 2015-05-18 (×4): 40 mg via ORAL
  Filled 2015-05-16 (×4): qty 1

## 2015-05-16 MED ORDER — MEPERIDINE HCL 100 MG/ML IJ SOLN
INTRAMUSCULAR | Status: AC
  Filled 2015-05-16: qty 2

## 2015-05-16 MED ORDER — LIDOCAINE VISCOUS 2 % MT SOLN
OROMUCOSAL | Status: DC | PRN
  Administered 2015-05-16: 4 mL via OROMUCOSAL

## 2015-05-16 MED ORDER — MIDAZOLAM HCL 5 MG/5ML IJ SOLN
INTRAMUSCULAR | Status: DC | PRN
  Administered 2015-05-16: 1 mg via INTRAVENOUS
  Administered 2015-05-16: 2 mg via INTRAVENOUS

## 2015-05-16 MED ORDER — MEPERIDINE HCL 100 MG/ML IJ SOLN
INTRAMUSCULAR | Status: DC | PRN
  Administered 2015-05-16: 25 mg via INTRAVENOUS

## 2015-05-16 MED ORDER — SODIUM CHLORIDE 0.9 % IV SOLN: INTRAVENOUS | Status: DC

## 2015-05-16 NOTE — Consult Note (Signed)
WOC ostomy consult note Stoma type/location: LLQ colostomy Stomal assessment/size: 1 and 1/4 inches-visualized through pouch Peristomal assessment: not seen today Treatment options for stomal/peristomal skin: none indicated Output: small amount of light brown stool in pouch with flatus Ostomy pouching: 2pc. 2 and 3/4 inch pouching system Education provided: None.  Patient does not provide self care, he reports that his son assists him with pouch changes and [facility]. Supply numbers provided and Nursing provided with guidance for care while patient is in acute care. Enrolled patient in [employer] DC program: No (Long standing ostomy patient)   WOC wound consult note Reason for Consult: Stage 4 pressure injury to sacrum (chronic, non-healing) Wound type:pressure Pressure Ulcer POA: Yes Measurement:3,5cm x 3.5cm x 0.4cm Wound bed: pale pink, smooth (indicative of biofilm), edges with epibole Drainage (amount, consistency, odor) light yellow drainage on old dressing Periwound: erythematous with evidence of previous wound healing in the location of the full thickness ulcer (scarring) Dressing procedure/placement/frequency: Silver hydrofiber for absorption as well as antimicrobial properties. Mattress replacement to provide pressure redistribution.  Bilateral heel boots for prevention of ulceration as well as alignment properties.    WOC nursing team will not follow, but will remain available to this patient, the nursing and medical teams.  Please re-consult if needed. Thanks, Ladona MowLaurie Regina Ganci, MSN, RN, GNP, Hans EdenCWOCN, CWON-[facility], FAAN  Pager# [phone]

## 2015-05-16 NOTE — H&P (Signed)
Primary Care Physician:  Milana Obey, MD Primary Gastroenterologist:  Dr. Darrick Penna  Pre-Procedure History & Physical: HPI:  Jon [name] is a 81 y.o. male here for Coffee ground emesis.  Past Medical History  Diagnosis Date  . PAF (paroxysmal atrial fibrillation) (HCC)   . OSA (obstructive sleep apnea)     Does not use CPAP  . Memory loss   . Bleeding ulcer   . B12 deficiency   . Essential hypertension   . Abnormality of gait   . Essential tremor   . Neuropathy (HCC)   . Varicose veins     Bilateral  . Macular degeneration of left eye   . Bilateral hydronephrosis   . Bladder calculi   . Multiple rib fractures   . Tachycardia-bradycardia syndrome (HCC)     [employer] PPM - Dr. Royann Shivers    Past Surgical History  Procedure Laterality Date  . Pacemaker insertion  2006  . Meningioma removal  1992    POST RT FRONTAL   . Cataract extraction Bilateral 2013  . Permanent pacemaker generator change N/A 08/13/2012    Procedure: PERMANENT PACEMAKER GENERATOR CHANGE;  Surgeon: Thurmon Fair, MD;  Location: [facility];  Service: Cardiovascular;  Laterality: N/A;  . Colostomy N/A 07/11/2014    Procedure: DIVERTING COLOSTOMY;  Surgeon: Dalia Heading, MD;  Location: [facility];  Service: General;  Laterality: N/A;  wound class: clean contaminated  . Incision and drainage of wound N/A 07/11/2014    Procedure: DEBRIDEMENT OF SACRUM;  Surgeon: Dalia Heading, MD;  Location: [facility];  Service: General;  Laterality: N/A;    Prior to Admission medications   Medication Sig Start Date End Date Taking? Authorizing Provider  ALPRAZolam Prudy Feeler) 1 MG tablet Take 0.5 tablets (0.5 mg total) by mouth 2 (two) times daily as needed for anxiety. 06/18/14  Yes Hollice Espy, MD  Amino Acids-Protein Hydrolys (FEEDING SUPPLEMENT, PRO-STAT SUGAR FREE 64,) LIQD Take 30 mLs by mouth 3 (three) times daily with meals.   Yes Historical Provider, MD  diphenhydrAMINE (BENADRYL) 25 MG tablet Take 25 mg by  mouth every 6 (six) hours as needed for itching.   Yes Historical Provider, MD  HYDROcodone-acetaminophen (NORCO) 10-325 MG tablet Take 1 tablet by mouth every 6 (six) hours as needed. 05/04/15  Yes Historical Provider, MD  levofloxacin (LEVAQUIN) 500 MG tablet Take 1 tablet (500 mg total) by mouth daily. 05/09/15  Yes Loren Racer, MD  Multiple Vitamin (MULTIVITAMIN WITH MINERALS) TABS tablet Take 1 tablet by mouth daily.   Yes Historical Provider, MD  Multiple Vitamins-Minerals (ICAPS AREDS FORMULA PO) Take 1 tablet by mouth daily.   Yes Historical Provider, MD  sotalol (BETAPACE) 160 MG tablet TAKE 1 TABLET BY MOUTH TWICE DAILY. 05/02/15  Yes Mihai Croitoru, MD  Tetrahydrozoline HCl (EYE DROPS OP) Apply 2 drops to eye daily as needed (irritation).   Yes Historical Provider, MD    Allergies as of 05/15/2015 - Review Complete 05/15/2015  Allergen Reaction Noted  . Iohexol  05/06/2006  . Shellfish allergy  09/22/2014  . Dilantin [phenytoin sodium extended] Rash 08/05/2012  . Tegretol [carbamazepine] Rash 08/05/2012    Family History  Problem Relation Age of Onset  . Aortic aneurysm Mother   . Parkinsonism Mother   . Heart disease Father   . Esophageal cancer Sister   . Colon cancer Neg Hx     Social History   Social History  . Marital Status: Married    Spouse Name: N/A  .  Number of Children: 2  . Years of Education: COLLEGE   Occupational History  . RETIRED    Social History Main Topics  . Smoking status: Former Smoker -- 0.75 packs/day for 25 years    Types: Cigarettes  . Smokeless tobacco: Never Used     Comment: QUIT 35 YRS AGO  . Alcohol Use: No  . Drug Use: No  . Sexual Activity: No   Other Topics Concern  . Not on file   Social History Narrative    Review of Systems: See HPI, otherwise negative ROS   Physical Exam: BP 131/90 mmHg  Pulse 65  Temp(Src) 98.9 F (37.2 C) (Oral)  Resp 16  Ht 5\' 10"  (1.778 m)  Wt 140 lb 10.5 oz (63.8 kg)  BMI 20.18  kg/m2  SpO2 99% General:   Alert,  pleasant and cooperative in NAD Head:  Normocephalic and atraumatic. Neck:  Supple; Lungs:  Clear throughout to auscultation.    Heart:  Regular rate and rhythm. Abdomen:  Soft, nontender and nondistended. Normal bowel sounds, without guarding, and without rebound.   Neurologic:  Alert and  oriented x4;  grossly normal neurologically.  Impression/Plan:   Coffee ground emesis PLAN: EGD TODAY

## 2015-05-16 NOTE — Op Note (Signed)
[name] [address] [place], [id number]   OPERATIVE PROCEDURE REPORT  PATIENT :Jon [name], Jon [name]  MR#: [id number] BIRTHDATE :[birth year] GENDER: male ENDOSCOPIST: West BaliSandi L  Grosser, MD ASSISTANT: PROCEDURE DATE: 05/16/2015 PRE-PROCEDURE PREPERATION: PRE-PROCEDURE PHYSICAL: PROCEDURE:     EGD w/ biopsy ASA CLASS: INDICATIONS:     hematemesis. MEDICATIONS:     Demerol 25 mg IV and Versed 3 mg IV TOPICAL ANESTHETIC:   Viscous Xylocaine  DESCRIPTION OF PROCEDURE:   After the risks benefits and alternatives of the procedure were thoroughly explained, informed consent was obtained.  The [id number])  was introduced through the mouth and advanced to the second portion of the duodenum , without limitations. The instrument was slowly withdrawn as the mucosa was fully examined. Estimated blood loss is zero unless otherwise noted in this procedure report.   ESOPHAGUS: FEW LINEAR EROSIONS/ULCERATIONS IN DISTAL ESOPHAGUS. STOMACH: J-SHAPED STOMACH. Moderate non-erosive gastritis (inflammation) was found in the gastric body and gastric antrum. Multiple biopsies were performed using cold forceps.   DUODENUM: The duodenal mucosa showed no abnormalities in the bulb and 2nd part of the duodenum.  The esophagus, stomach and the proximal small bowel appeared normal. There were no ulcers, erosions, masses or polyps noted.    . The scope was then withdrawn from the patient and the procedure terminated. The patient tolerated the procedure without immediate complications.  IMPRESSION:  FEW LINEAR EROSIONS/ULCERATIONS IN DISTAL ESOPHAGUS Non-erosive gastritis (inflammation) was found in the gastric body and gastric antrum; multiple biopsies were performed The duodenal mucosa showed no abnormalities in the bulb and 2nd part of the duodenum RECOMMENDATIONS:     ADVANCE DIET BID PPI AWAIT BIOPSY  REPEAT EXAM: DISCHARGE  INSTRUCTIONS: _______________________________ Rosalie DoctoreSignedWest Bali:  Corine Solorio L Galit Urich, MD 05/16/2015 4:02 PM   CPT CODES: DIAGNOSIS CODES:  The ICD and CPT codes recommended by this software are interpretations from the data that the clinical staff has captured with the software.  The verification of the translation of this report to the ICD and CPT codes and modifiers is the sole responsibility of the health care institution and practicing physician where this report was generated.  [employer] will not be held responsible for the validity of the ICD and CPT codes included on this report.  AMA assumes no liability for data contained or not contained herein. CPT is a Publishing rights managerregistered trademark of the [employer].   CC:

## 2015-05-16 NOTE — Progress Notes (Signed)
PROGRESS NOTE  Jon [name] ZOX:[id number] DOB: 1932-04-18 DOA: 05/15/2015 PCP: Milana Obey, MD  Summary: 9 yom with medical history of Afib, Dementia, foley catheter, colostomy, and polyneuropathy presented with complaints of nausea, and hematemesis. Last night after eating dinner he reports increased nausea and vomiting with what appeared to be blood mixed within. Admits weakness, lower abdominal pain rated at 7/10 but resolved with pain medications. Uses a wheelchair as needed. Denies any rectal bleeding, fever, changes to fever, sore throat, new muscle aches, dysuria, chest pain, SOB or diarrhea. Noted to be on Levaquin since 11/24 for UTI, cultures have been sent by PCP. Reports recent rash for several months, associated itching relieved with Benadryl. While in the ED, noted to have multiple episodes of vomiting coffee-ground emesis with positive gastric occult. Admitted for GI consult and further treatment.   Assessment/Plan: 1. Hematemesis with generalized abd pain and remote h/o PUD. Hgb dropped but now appears to be stable. GI has evaluated and plans for EGD 11/29. Keep NPO.  2. UTI; present upon admission. Treated as outpatient, completed Levaquin. Asymptomatic. Chronic indwelling foley noted.  3. Afib, stable. Sotalol on hold, f/u EKG in AM per cardiology. 4. Prolong QT interval. Levaquin stopped. Repeat EKG in AM. 5. Essential HTN, remains stable.  6. Anxiety, Xanax PRN 7. Stage 4 pressure injury to sacrum. Evaluated by wound care. Per recommendations silver hydrofiber for absorption as well as antimicrobial properties. Mattress replacement to provide pressure redistribution. Bilateral heel boots for prevention of ulceration as well as alignment properties.    Overall appears stable. Hgb may have stabllized. Plan EGD today and repeat EKG in the morning.  Will replace Mg and Potassium.   Likely home in the morning if no reoccurrence of vomiting or evidence of bleeding.    Code Status: Full DVT prophylaxis: SCDs  Family Communication: Discussed with patient who understands and has no concerns at this time. Disposition Plan: Anticipate discharge within 24 hours.   Brendia Sacks, MD  [facility]  Pager [phone] If 7PM-7AM, please contact night-coverage at [email or web address], password [facility] 05/16/2015, 7:48 AM  LOS: 1 day   Consultants:  WOC  GI  Cardiology   Procedures:    Antibiotics:  Levaquin 11/28  HPI/Subjective: Feeling fine. No reoccurrence of vomiting or nausea. No pain.  Objective: Filed Vitals:   05/15/15 1615 05/15/15 1715 05/15/15 2332 05/16/15 0658  BP:  156/79 113/73 113/73  Pulse:  69 62 60  Temp: 97.8 F (36.6 C) 98.5 F (36.9 C) 98.4 F (36.9 C) 98.1 F (36.7 C)  TempSrc: Oral Oral Oral Oral  Resp:  Height:   (1.778 m)    Weight:  63.8 kg (140 lb 10.5 oz)    SpO2:  100% 100% 100%    Intake/Output Summary (Last 24 hours) at 05/16/15 0748 Last data filed at 05/16/15 0701  Gross per 24 hour  Intake      0 ml  Output    450 ml  Net   -450 ml     Filed Weights   05/15/15 0624 05/15/15 1715  Weight: 70.308 kg (155 lb) 63.8 kg (140 lb 10.5 oz)    Exam:  Afebrile, VSS, not hypoxic  General:  Appears calm and comfortable. Cardiovascular: RRR, no m/r/g. No LE edema. Respiratory: CTA bilaterally, no w/r/r. Normal respiratory effort. Abdomen: soft, ntnd Musculoskeletal: grossly normal tone BUE/BLE Psychiatric: grossly normal mood and affect, speech fluent and appropriate Neurologic: grossly non-focal.  New data reviewed:  WBC, improved 10.0.  Hgb, stable 11.6.  Potassium 3.4, Mg 1.5  BMP unremarkable  Pertinent data since admission:    Pending data:    Scheduled Meds: . Chlorhexidine Gluconate Cloth  6 each Topical Q0600  . levofloxacin (LEVAQUIN) IV  500 mg Intravenous Q24H  . mupirocin ointment  1 application Nasal BID  . pantoprazole (PROTONIX) IV  40 mg  Intravenous Q12H  . sodium chloride  3 mL Intravenous Q12H   Continuous Infusions: . sodium chloride 125 mL/hr at 05/16/15 0446    Principal Problem:   Hematemesis Active Problems:   Atrial fibrillation (HCC)   UTI (lower urinary tract infection)   GIB (gastrointestinal bleeding)   Time spent 20 minutes   By signing my name below, I, Zadie CleverlyJessica Augustus attest that this documentation has been prepared under the direction and in the presence of Brendia Sacksaniel Goodrich, MD Electronically signed: Zadie CleverlyJessica Augustus  05/16/2015 12:04pm   I personally performed the services described in this documentation. All medical record entries made by the scribe were at my direction. I have reviewed the chart and agree that the record reflects my personal performance and is accurate and complete. Brendia Sacksaniel Goodrich, MD

## 2015-05-16 NOTE — Care Management Obs Status (Signed)
MEDICARE OBSERVATION STATUS NOTIFICATION   Patient Details  Name: Jon [name] MRN: [id number] Date of Birth: 1932-03-19   Medicare Observation Status Notification Given:  Yes    Malcolm MetroChildress, Silas Sedam Demske, RN 05/16/2015, 12:44 PM

## 2015-05-16 NOTE — Care Management Note (Signed)
Case Management Note  Patient Details  Name: Jon CutterWilliam P [name] MRN: [id number] Date of Birth: 22-May-1932  Subjective/Objective:                  Pt is from home, lives with his wife and son. Pt is mostly bed-bound but has been working with PT to get up into a chair. Pt has hospital bed and WC at home. [facility], [facility].   Action/Plan: Pt plans to return home [facility]. Alroy BailiffLinda Lothian, of [facility], made aware and will obtain pt info from chart. Pt will need order to resume HH services at [place]. Pt aware HH has 48 hours to resume services at [place]. No further CM needs, will update [facility] on when pt discharges.   Expected Discharge Date:     05/16/2015             Expected Discharge Plan:  Home w Home Health Services  In-House Referral:  NA  Discharge planning Services  CM Consult  Post Acute Care Choice:  Resumption of Svcs/PTA Provider Choice offered to:  Patient  DME Arranged:    DME Agency:     HH Arranged:  RN, PT, Nurse's Aide HH Agency:  [facility]  Status of Service:  In process, will continue to follow  Medicare Important Message Given:    Date Medicare IM Given:    Medicare IM give by:    Date Additional Medicare IM Given:    Additional Medicare Important Message give by:     If discussed at Long Length of Stay Meetings, dates discussed:    Additional Comments:  Malcolm MetroChildress, Lurlie Wigen Demske, RN 05/16/2015, 12:45 PM

## 2015-05-16 NOTE — Consult Note (Signed)
CARDIOLOGY CONSULT NOTE   Patient ID: Jon [name] MRN: [id number] DOB/AGE: 1932-02-23 81 y.o.  Admit Date: 05/15/2015 Referring Physician: PTH-Goodrich MD Primary Physician: Milana Obey, MD Consulting Cardiologist: Nona Dell MD Primary Cardiologist: Thurmon Fair MD Reason for Consultation: Prolonged QT interval on Sotolol   Clinical Summary Jon [name] is an 81 y.o.male followed by Dr. Royann Shivers with history of PAF and sinus node dysfunction with PPM, generator replacement per Dr. Royann Shivers on 08/13/2012 - Medtronic-Adapta model number ADDRL1, serial number [id number]. Based on records he has been treated with sotalol (previously on amiodarone but did not tolerate), and Coumadin, although has not been on anticoagulation since January of this year. Device interrogations show his atrial fibrillation burden to be around 30%.  He was admitted with nausea and suspected hematemesis after eating dinner, with lower abdominal pain. He is also being treated as OP for UTI on levaquin. He was found to have heme-positive emesis. He has been home-bound due to recent left hip repair which got infected. He was placed at [facility] until Sept. He was taken off of coumadin in January when he had a diverting colostomy in the setting of severe decubitus ulcer, necrosis extending into anal verge, with frequent stooling causing infection.  He states he returned home in Sept and has home health. Since admission, he has been feeling better and not having any more nausea and vomiting. He denies chest pain, rapid HR, dizziness or dyspnea. He is very sedentary and is having home health assist him. He continues to be treated for sacral decubitus ulcer.  We are consulted to address long QT interval noted by ECG. Review of telephone notes also finds concerns about a rash that he had earlier in November resulting in discontinuation of his Cardizem which had been switched from Cardizem CD 120 mg daily to  short acting Cardizem. It is not clear that this intervention made any difference in speaking with the patient now.   Allergies  Allergen Reactions  . Iohexol      Desc: PT STATES THROAT/FACE SWELLING W/ IVP DYE IN 1990. PT HAS NOT HAD ANY EXAMS DONE W/DYE SINCE AND HAS NEVER BEEN PRE MEDICATED. PER PT, Onset Date: [id number]   . Shellfish Allergy   . Dilantin [Phenytoin Sodium Extended] Rash  . Tegretol [Carbamazepine] Rash    Medications Scheduled Medications: . Chlorhexidine Gluconate Cloth  6 each Topical Q0600  . levofloxacin (LEVAQUIN) IV  500 mg Intravenous Q24H  . mupirocin ointment  1 application Nasal BID  . pantoprazole (PROTONIX) IV  40 mg Intravenous Q12H  . sodium chloride  3 mL Intravenous Q12H    Infusions: . sodium chloride 125 mL/hr at 05/16/15 0446    PRN Medications: acetaminophen **OR** acetaminophen, ALPRAZolam, diphenhydrAMINE, HYDROcodone-acetaminophen, morphine injection, ondansetron **OR** ondansetron (ZOFRAN) IV   Past Medical History  Diagnosis Date  . PAF (paroxysmal atrial fibrillation) (HCC)   . OSA (obstructive sleep apnea)     Does not use CPAP  . Memory loss   . Bleeding ulcer   . B12 deficiency   . Essential hypertension   . Abnormality of gait   . Essential tremor   . Neuropathy (HCC)   . Varicose veins     Bilateral  . Macular degeneration of left eye   . Bilateral hydronephrosis   . Bladder calculi   . Multiple rib fractures   . Tachycardia-bradycardia syndrome (HCC)     Medtronic PPM - Dr. Royann Shivers    Past Surgical History  Procedure  Laterality Date  . Pacemaker insertion  2006  . Meningioma removal  1992    POST RT FRONTAL   . Cataract extraction Bilateral 2013  . Permanent pacemaker generator change N/A 08/13/2012    Procedure: PERMANENT PACEMAKER GENERATOR CHANGE;  Surgeon: Thurmon FairMihai Croitoru, MD;  Location: [facility] CATH LAB;  Service: Cardiovascular;  Laterality: N/A;  . Colostomy N/A 07/11/2014    Procedure: DIVERTING  COLOSTOMY;  Surgeon: Dalia HeadingMark A Jenkins, MD;  Location: [facility] ORS;  Service: General;  Laterality: N/A;  wound class: clean contaminated  . Incision and drainage of wound N/A 07/11/2014    Procedure: DEBRIDEMENT OF SACRUM;  Surgeon: Dalia HeadingMark A Jenkins, MD;  Location: [facility] ORS;  Service: General;  Laterality: N/A;    Family History  Problem Relation Age of Onset  . Aortic aneurysm Mother   . Parkinsonism Mother   . Heart disease Father   . Esophageal cancer Sister   . Colon cancer Neg Hx     Social History Jon [name] reports that he has quit smoking. His smoking use included Cigarettes. He has a 18.75 pack-year smoking history. He has never used smokeless tobacco. Jon [name] reports that he does not drink alcohol.  Review of Systems Complete review of systems are found to be negative unless outlined in H&P above. Recent UTI. General failure to thrive. Improved rash.  Physical Examination Blood pressure 113/73, pulse 60, temperature 98.1 F (36.7 C), temperature source Oral, resp. rate 20, height 5\' 10"  (1.778 m), weight 140 lb 10.5 oz (63.8 kg), SpO2 100 %.  Intake/Output Summary (Last 24 hours) at 05/16/15 1050 Last data filed at 05/16/15 0800  Gross per 24 hour  Intake      0 ml  Output    450 ml  Net   -450 ml    Telemetry: Atrial paced rhythm currently.   GEN: Disheveled male in no distress. HEENT: Conjunctiva and lids normal, oropharynx clear. Neck: Supple, no elevated JVP or carotid bruits, no thyromegaly. Lungs: Clear to auscultation, nonlabored breathing at rest. Cardiac: Regular rate and rhythm, no S3 or significant systolic murmur, no pericardial rub. Abdomen: Soft, nontender, bowel sounds present, no guarding or rebound. Colostomy noted on the left.  Extremities: No pitting edema, distal pulses 2+. Skin: Dry with some scaling. Resolving rash on arms. Musculoskeletal: No kyphosis. Neuropsychiatric: Alert and oriented x3, affect grossly appropriate.   Prior Cardiac  Testing/Procedures Remote PPM check 02/28/2015 Device function reviewed. Impedance, sensing, auto capture thresholds consistent with previous measurements. Histograms appropriate for patient and level of activity. All other diagnostic data reviewed and is appropriate and  stable for patient. Real time/magnet EGM shows appropriate sensing and capture. 37.9% mode switch, no OAC per [facility] on 11-28-14 check. No ventricular high rate episodes. Estimated longevity 2528yrs.   Lab Results  Basic Metabolic Panel:  Recent Labs Lab 05/15/15 0713 05/16/15 0348  NA 138 138  K 3.5 3.4*  CL 97* 102  CO2 29 27  GLUCOSE 148* 93  BUN 17 17  CREATININE 1.01 0.88  CALCIUM 9.3 8.3*  MG  --  1.5*    Liver Function Tests:  Recent Labs Lab 05/15/15 0713 05/16/15 0348  AST 17 13*  ALT 10* 9*  ALKPHOS 81 58  BILITOT 1.0 0.7  PROT 7.1 5.7*  ALBUMIN 3.4* 2.7*    CBC:  Recent Labs Lab 05/15/15 0713 05/15/15 1625 05/15/15 2200 05/16/15 0348  WBC 19.0* 12.8* 11.8* 10.0  NEUTROABS 17.2*  --   --   --  HGB 14.0 13.0 11.8* 11.6*  HCT 40.7 37.9* 35.1* 35.5*  MCV 83.1 83.3 83.4 84.9  PLT 301 238 207 238   Radiology: Dg Abd Acute W/chest  05/15/2015  CLINICAL DATA:  One day history abdominal pain and hematemesis EXAM: DG ABDOMEN ACUTE W/ 1V CHEST COMPARISON:  Chest radiograph July 05, 2014; CT abdomen and pelvis June 15, 2014 FINDINGS: PA chest: There is no edema or consolidation. The heart is upper normal in size with pulmonary vascularity within normal limits. There is atherosclerotic calcification in the aorta. Pacemaker leads are attached to the right atrium and right ventricle. No pneumothorax. No adenopathy. Supine and upright abdomen: Stomach is distended with air. There is no appreciable small bowel are large bowel dilatation. No free air. There are phleboliths in the pelvis. There is degenerative change in the lumbar spine. There is moderate stool in the colon. IMPRESSION: Stomach  distended with air. No small or large bowel obstruction. No free air. No lung edema or consolidation. Electronically Signed   By: Bretta Bang III M.D.   On: 05/15/2015 14:10    ECG: Initial tracing from November 28 shows atrial paced rhythm with QTC approximately 520 ms. Follow-up tracing on November 29 shows apparent atrial fibrillation with intermittent pacing - QT interval difficult to calculate.  Impression and Recommendations  1. Prolonged QT interval:  Review of previous tracings finds that his QTc has generally run around 500 ms for quite some time now on sotalol, and he has done well. QT interval is longer now based on available interpretable tracing, but this is also in the setting of Levaquin which can further increase the QT interval. Would recommend stopping Levaquin and switching to a different antibiotic appropriate for management of his UTI. Follow-up ECG tomorrow. If QTc improves toward prior baseline, would resume sotalol at previous the dose.  2. Paroxysmal atrial fibrillation: He has had reasonable rhythm control on sotalol, generally around 30% atrial fibrillation burden. Would therefore plan to continue sotalol as outlined above. I do note that he was taken off of Cardizem with concerns about a rash, but it is not entirely clear that these were related. Since he is not having any high ventricular rates, can hold off Cardizem for now and reintroduce if needed. He has a CHADSVASC score of 3 and was previously on Coumadin although this was discontinued earlier in the year as outlined above. Not entirely clear about plan for resuming, but certainly would hold off anticoagulation for now with recent hematemesis and anemia.  3. Medtronic PPM in situ: Being checked remotely..   SignedBettey Mare. Lawrence NP [facility]  05/16/2015, 10:50 AM Co-Sign MD  Attending note:  Patient seen and examined. I reviewed multiple records, updated the chart, and extensively modified the above note  by Ms. Lawrence NP to reflect my findings, examination and recommendations.  Jonelle Sidle, M.D., F.A.C.C.

## 2015-05-16 NOTE — Consult Note (Signed)
Referring Provider: Dr. Irene Limbo Primary Care Physician:  Milana Obey, MD Primary Gastroenterologist:  Dr. Darrick Penna   Date of Admission: 05/15/15 Date of Consultation: 05/16/15  Reason for Consultation:  Hematemesis   HPI:  Jon [name] is an 81 y.o. year old male presenting with complaints of nausea and hematemesis. Multiple episodes of vomiting coffee-ground emesis in ED. GI consult requested. Admitting Hgb 14 yesterday, this morning 11.6. Abd xray with stomach distended with air, no obstruction.   Started feeling sick Sunday evening, with multiple episodes of vomiting. Called EMS and states they felt it was blood. Reddish/brown emesis. Had vague lower abdomen discomfort on left side. N/V resolved. No melena. No hematochezia. Was taking generic Prilosec at home, which controlled symptoms. Underwent diverting colostomy earlier this year due to sacral decubitis ulcer. No NSAIDs. No dysphagia. Possible EGD in remote past by Dr. Karilyn Cota. No records in epic.     Past Medical History  Diagnosis Date  . PAF (paroxysmal atrial fibrillation) (HCC)   . OSA (obstructive sleep apnea)     Does not use CPAP  . Memory loss   . Bleeding ulcer   . B12 deficiency   . Essential hypertension   . Abnormality of gait   . Essential tremor   . Neuropathy (HCC)   . Varicose veins     Bilateral  . Macular degeneration of left eye   . Bilateral hydronephrosis   . Bladder calculi   . Multiple rib fractures   . Tachycardia-bradycardia syndrome (HCC)     [employer] PPM - Dr. Royann Shivers    Past Surgical History  Procedure Laterality Date  . Pacemaker insertion  2006  . Meningioma removal  1992    POST RT FRONTAL   . Cataract extraction Bilateral 2013  . Permanent pacemaker generator change N/A 08/13/2012    Procedure: PERMANENT PACEMAKER GENERATOR CHANGE;  Surgeon: Thurmon Fair, MD;  Location: [facility]ATH [facility];  Service: Cardiovascular;  Laterality: N/A;  . Colostomy N/A 07/11/2014     Procedure: DIVERTING COLOSTOMY;  Surgeon: Dalia Heading, MD;  Location: [facility];  Service: General;  Laterality: N/A;  wound class: clean contaminated  . Incision and drainage of wound N/A 07/11/2014    Procedure: DEBRIDEMENT OF SACRUM;  Surgeon: Dalia Heading, MD;  Location: [facility];  Service: General;  Laterality: N/A;    Prior to Admission medications   Medication Sig Start Date End Date Taking? Authorizing Provider  ALPRAZolam Prudy Feeler) 1 MG tablet Take 0.5 tablets (0.5 mg total) by mouth 2 (two) times daily as needed for anxiety. 06/18/14  Yes Hollice Espy, MD  Amino Acids-Protein Hydrolys (FEEDING SUPPLEMENT, PRO-STAT SUGAR FREE 64,) LIQD Take 30 mLs by mouth 3 (three) times daily with meals.   Yes Historical Provider, MD  diphenhydrAMINE (BENADRYL) 25 MG tablet Take 25 mg by mouth every 6 (six) hours as needed for itching.   Yes Historical Provider, MD  HYDROcodone-acetaminophen (NORCO) 10-325 MG tablet Take 1 tablet by mouth every 6 (six) hours as needed. 05/04/15  Yes Historical Provider, MD  levofloxacin (LEVAQUIN) 500 MG tablet Take 1 tablet (500 mg total) by mouth daily. 05/09/15  Yes Loren Racer, MD  Multiple Vitamin (MULTIVITAMIN WITH MINERALS) TABS tablet Take 1 tablet by mouth daily.   Yes Historical Provider, MD  Multiple Vitamins-Minerals (ICAPS AREDS FORMULA PO) Take 1 tablet by mouth daily.   Yes Historical Provider, MD  sotalol (BETAPACE) 160 MG tablet TAKE 1 TABLET BY MOUTH TWICE DAILY. 05/02/15  Yes  Mihai Croitoru, MD  Tetrahydrozoline HCl (EYE DROPS OP) Apply 2 drops to eye daily as needed (irritation).   Yes Historical Provider, MD    Current Facility-Administered Medications  Medication Dose Route Frequency Provider Last Rate Last Dose  . 0.9 %  sodium chloride infusion   Intravenous Continuous Standley Brookinganiel P Goodrich, MD 125 mL/hr at 05/16/15 0446    . acetaminophen (TYLENOL) tablet 650 mg  650 mg Oral Q6H PRN Standley Brookinganiel P Goodrich, MD       Or  . acetaminophen (TYLENOL)  suppository 650 mg  650 mg Rectal Q6H PRN Standley Brookinganiel P Goodrich, MD      . ALPRAZolam Prudy Feeler(XANAX) tablet 0.5 mg  0.5 mg Oral BID PRN Standley Brookinganiel P Goodrich, MD      . Chlorhexidine Gluconate Cloth 2 % PADS 6 each  6 each Topical Q0600 Standley Brookinganiel P Goodrich, MD   6 each at 05/16/15 0600  . diphenhydrAMINE (BENADRYL) capsule 25 mg  25 mg Oral Q6H PRN Standley Brookinganiel P Goodrich, MD   25 mg at 05/16/15 0445  . HYDROcodone-acetaminophen (NORCO) 10-325 MG per tablet 1 tablet  1 tablet Oral Q6H PRN Standley Brookinganiel P Goodrich, MD      . levofloxacin [facility](LEVAQUIN) IVPB 500 mg  500 mg Intravenous Q24H Standley Brookinganiel P Goodrich, MD   Stopped at 05/15/15 1603  . morphine 2 MG/ML injection 2 mg  2 mg Intravenous Q3H PRN Standley Brookinganiel P Goodrich, MD      . mupirocin ointment (BACTROBAN) 2 % 1 application  1 application Nasal BID Standley Brookinganiel P Goodrich, MD   1 application at 05/16/15 0445  . ondansetron (ZOFRAN) tablet 4 mg  4 mg Oral Q6H PRN Standley Brookinganiel P Goodrich, MD       Or  . ondansetron [facility](ZOFRAN) injection 4 mg  4 mg Intravenous Q6H PRN Standley Brookinganiel P Goodrich, MD      . pantoprazole (PROTONIX) injection 40 mg  40 mg Intravenous Q12H Standley Brookinganiel P Goodrich, MD   40 mg at 05/15/15 2155  . sodium chloride 0.9 % injection 3 mL  3 mL Intravenous Q12H Standley Brookinganiel P Goodrich, MD   3 mL at 05/15/15 2200    Allergies as of 05/15/2015 - Review Complete 05/15/2015  Allergen Reaction Noted  . Iohexol  05/06/2006  . Shellfish allergy  09/22/2014  . Dilantin [phenytoin sodium extended] Rash 08/05/2012  . Tegretol [carbamazepine] Rash 08/05/2012    Family History  Problem Relation Age of Onset  . Aortic aneurysm Mother   . Parkinsonism Mother   . Heart disease Father   . Esophageal cancer Sister   . Colon cancer Neg Hx     Social History   Social History  . Marital Status: Married    Spouse Name: N/A  . Number of Children: 2  . Years of Education: COLLEGE   Occupational History  . RETIRED    Social History Main Topics  . Smoking status: Former Smoker -- 0.75 packs/day for  25 years    Types: Cigarettes  . Smokeless tobacco: Never Used     Comment: QUIT 35 YRS AGO  . Alcohol Use: No  . Drug Use: No  . Sexual Activity: No   Other Topics Concern  . Not on file   Social History Narrative    Review of Systems: Gen: see HPI  CV: Denies chest pain, heart palpitations, syncope, edema  Resp: Denies shortness of breath with rest, cough, wheezing GI: see HPI  GU : Denies urinary burning, urinary frequency, urinary incontinence.  MS: bed-bound  Derm: Denies rash, itching, dry skin Psych: Denies depression, anxiety,confusion, or memory loss Heme: Denies bruising, bleeding, and enlarged lymph nodes.  Physical Exam: Vital signs in last 24 hours: Temp:  [97.8 F (36.6 C)-98.5 F (36.9 C)] 98.1 F (36.7 C) (11/29 0658) Pulse Rate:  [60-80] 60 (11/29 0658) Resp:  [13-20] 20 (11/29 0658) BP: (113-156)/(73-94) 113/73 mmHg (11/29 0658) SpO2:  [94 %-100 %] 100 % (11/29 0658) Weight:  [140 lb 10.5 oz (63.8 kg)] 140 lb 10.5 oz (63.8 kg) (11/28 1715)   General:   Alert, appears stated age, oriented to person, place, situation. Head:  Normocephalic and atraumatic. Eyes:  Sclera clear, no icterus.   Conjunctiva pink. Ears:  Normal auditory acuity. Nose:  No deformity, discharge,  or lesions. Mouth:  No deformity or lesions, dentition normal. Lungs:  Clear throughout to auscultation.   Heart:  S1 S2 present without obvious murmur  Abdomen:  Soft, nontender and nondistended. No masses, hepatosplenomegaly or hernias noted. Colostomy in LLQ with pink stoma, bag intact, soft brown stool Rectal:  Deferred  Msk:  Kyphosis, thin lower extremities, left ankle turned inward Extremities:  Without  edema. Neurologic:  Alert and  oriented x4 Psych:  Alert and cooperative. Normal mood and affect.  Intake/Output from previous day: 11/28 0701 - 11/29 0700 In: -  Out: 150 [Urine:150] Intake/Output this shift: Total I/O In: 0  Out: 300 [Urine:300]  Lab  Results:  Recent Labs  05/15/15 1625 05/15/15 2200 05/16/15 0348  WBC 12.8* 11.8* 10.0  HGB 13.0 11.8* 11.6*  HCT 37.9* 35.1* 35.5*  PLT 238 207 238   BMET  Recent Labs  05/15/15 0713 05/16/15 0348  NA 138 138  K 3.5 3.4*  CL 97* 102  CO2 29 27  GLUCOSE 148* 93  BUN 17 17  CREATININE 1.01 0.88  CALCIUM 9.3 8.3*   LFT  Recent Labs  05/15/15 0713 05/16/15 0348  PROT 7.1 5.7*  ALBUMIN 3.4* 2.7*  AST 17 13*  ALT 10* 9*  ALKPHOS 81 58  BILITOT 1.0 0.7    Studies/Results: Dg Abd Acute W/chest  05/15/2015  CLINICAL DATA:  One day history abdominal pain and hematemesis EXAM: DG ABDOMEN ACUTE W/ 1V CHEST COMPARISON:  Chest radiograph July 05, 2014; CT abdomen and pelvis June 15, 2014 FINDINGS: PA chest: There is no edema or consolidation. The heart is upper normal in size with pulmonary vascularity within normal limits. There is atherosclerotic calcification in the aorta. Pacemaker leads are attached to the right atrium and right ventricle. No pneumothorax. No adenopathy. Supine and upright abdomen: Stomach is distended with air. There is no appreciable small bowel are large bowel dilatation. No free air. There are phleboliths in the pelvis. There is degenerative change in the lumbar spine. There is moderate stool in the colon. IMPRESSION: Stomach distended with air. No small or large bowel obstruction. No free air. No lung edema or consolidation. Electronically Signed   By: Bretta Bang III M.D.   On: 05/15/2015 14:10    Impression: 81 year old male admitted with hematemesis in the setting of repetitive vomiting, query MW tear, esophagitis, gastritis. No anticoagulation and denies any NSAIDs, aspirin powders. Chronic history of GERD, taking generic Prilosec per report. Several gram drop in Hgb likely multifactorial but remaining stable currently. No further hematemesis and symptoms much improved since admission. Needs EGD for further assessment today with Dr.  Darrick Penna. Risks and benefits discussed with patient in agreement. As of note, has pacemaker.   Plan: Remain  NPO Agree with PPI BID EGD today with Dr. Darrick Penna.   Nira Retort, ANP-BC [facility]     LOS: 1 day    05/16/2015, 10:26 AM

## 2015-05-17 DIAGNOSIS — I4581 Long QT syndrome: Secondary | ICD-10-CM | POA: Diagnosis not present

## 2015-05-17 DIAGNOSIS — K922 Gastrointestinal hemorrhage, unspecified: Secondary | ICD-10-CM | POA: Diagnosis not present

## 2015-05-17 DIAGNOSIS — N390 Urinary tract infection, site not specified: Secondary | ICD-10-CM | POA: Diagnosis not present

## 2015-05-17 DIAGNOSIS — Z950 Presence of cardiac pacemaker: Secondary | ICD-10-CM | POA: Diagnosis not present

## 2015-05-17 DIAGNOSIS — I480 Paroxysmal atrial fibrillation: Secondary | ICD-10-CM | POA: Diagnosis not present

## 2015-05-17 LAB — CBC
HEMATOCRIT: 34.2 % — AB (ref 39.0–52.0)
Hemoglobin: 11.4 g/dL — ABNORMAL LOW (ref 13.0–17.0)
MCH: 28.3 pg (ref 26.0–34.0)
MCHC: 33.3 g/dL (ref 30.0–36.0)
MCV: 84.9 fL (ref 78.0–100.0)
PLATELETS: 229 10*3/uL (ref 150–400)
RBC: 4.03 MIL/uL — ABNORMAL LOW (ref 4.22–5.81)
RDW: 14.5 % (ref 11.5–15.5)
WBC: 9.7 10*3/uL (ref 4.0–10.5)

## 2015-05-17 MED ORDER — SOTALOL HCL 80 MG PO TABS
160.0000 mg | ORAL_TABLET | Freq: Two times a day (BID) | ORAL | Status: DC
  Administered 2015-05-17 – 2015-05-18 (×3): 160 mg via ORAL
  Filled 2015-05-17 (×5): qty 2

## 2015-05-17 MED ORDER — DOCUSATE SODIUM 100 MG PO CAPS
200.0000 mg | ORAL_CAPSULE | Freq: Every day | ORAL | Status: DC
  Administered 2015-05-17: 200 mg via ORAL
  Filled 2015-05-17: qty 2

## 2015-05-17 NOTE — Progress Notes (Signed)
    Primary cardiologist: Dr. Rachelle HoraMihai Croitoru  Seen for followup: PAF, prolonged QT interval  Subjective:    No chest pain or palpitations. Underwent EGD yesterday.  Objective:   Temp:  [98 F (36.7 C)-98.9 F (37.2 C)] 98.5 F (36.9 C) (11/30 0709) Pulse Rate:  [42-124] 73 (11/30 0709) Resp:  [10-27] 20 (11/30 0709) BP: (108-147)/(76-92) 132/82 mmHg (11/30 0709) SpO2:  [93 %-100 %] 97 % (11/30 0934)    Filed Weights   05/15/15 0624 05/15/15 1715  Weight: 155 lb (70.308 kg) 140 lb 10.5 oz (63.8 kg)    Intake/Output Summary (Last 24 hours) at 05/17/15 1012 Last data filed at 05/17/15 0900  Gross per 24 hour  Intake 1899.17 ml  Output    600 ml  Net 1299.17 ml    Telemetry: Rate-controlled atrial fibrillation in the 70s. Intermittent ventricular pacing.  Exam:  General: Disheveled male, no distress.  Lungs: Diminshed breath sounds, no wheezing.  Cardiac: Irregularly irregular, no gallop.  Abdomen: Colostomy.  Extremities: No pitting edema.  Lab Results:  Basic Metabolic Panel:  Recent Labs Lab 05/15/15 0713 05/16/15 0348  NA 138 138  K 3.5 3.4*  CL 97* 102  CO2 29 27  GLUCOSE 148* 93  BUN 17 17  CREATININE 1.01 0.88  CALCIUM 9.3 8.3*  MG  --  1.5*    Liver Function Tests:  Recent Labs Lab 05/15/15 0713 05/16/15 0348  AST 17 13*  ALT 10* 9*  ALKPHOS 81 58  BILITOT 1.0 0.7  PROT 7.1 5.7*  ALBUMIN 3.4* 2.7*    CBC:  Recent Labs Lab 05/15/15 2200 05/16/15 0348 05/17/15 0622  WBC 11.8* 10.0 9.7  HGB 11.8* 11.6* 11.4*  HCT 35.1* 35.5* 34.2*  MCV 83.4 84.9 84.9  PLT 207 238 229    ECG: Follow-up tracing today shows rate-controlled atrial fibrillation with improved QTc 450 ms to 475 ms range.   Medications:   Scheduled Medications: . Chlorhexidine Gluconate Cloth  6 each Topical Q0600  . mupirocin ointment  1 application Nasal BID  . pantoprazole  40 mg Oral BID AC  . sodium chloride  3 mL Intravenous Q12H    Infusions: .  sodium chloride 50 mL/hr at 05/17/15 0532    PRN Medications: acetaminophen **OR** acetaminophen, ALPRAZolam, diphenhydrAMINE, HYDROcodone-acetaminophen, morphine injection, ondansetron **OR** ondansetron (ZOFRAN) IV   Assessment:   1. Prolonged QT interval, improved having discontinued Levaquin and sotalol.  2. History of PAF, now in rate-controlled atrial fibrillation. He has had a a prolonged QTc around 500 ms on sotalol and done well with this over time, providing control of his PAF with a burden of about 30% based on device interrogations. We will plan to resume sotalol at the previous dose and continue to observe. For now holding off on Cardizem as he has not had any high ventricular rates. He is not anticoagulated at the present time, Coumadin was discontinued earlier in the year. With recent hematemesis and anemia, would not resume at this time. CHADSVASC score is 3.  3. Medtronic pacemaker in situ with history of sick sinus syndrome.  4. Recent GI bleed with EGD showing linear erosions and ulcerations in the distal esophagus and associated gastritis.   Plan/Discussion:    Will resume sotalol at 160 mg twice daily which was his previous tolerated dose. Follow-up ECG a.m.   Jonelle SidleSamuel G. McDowell, M.D., F.A.C.C.

## 2015-05-17 NOTE — Progress Notes (Signed)
[facility] PROGRESS NOTE  Jon CutterWilliam P [name] ZOX:[id number] DOB: 05-01-1932 DOA: 05/15/2015 PCP: Jon ObeyKNOWLTON,STEPHEN D, MD  Assessment/Plan: 1. Hematemesis with generalized abd pain and remote h/o PUD. Hgb dropped from 13 to 11 on 11/28 but now appears to be stable around 11.4. GI followed and performed EGD 11/29 which revealed few linear erosions/ulcerations in the distal esophagus and non-erosive gastritis. Follow up biopsies.  Patient is tolerating a normal diet and denies any recurrent vomiting. Continue Protonix BID.  2. UTI; present upon admission. Treated as outpatient and completed Levaquin. Remains asymptomatic. Chronic indwelling foley noted.  3. Paroxysmal Afib, rate controlled. Cardiology consulted and will resume Sotalol at 160mg  BID. Will repeat EKG tomorrow morning. He is not currently on anticoagulation.  4. Prolong QT interval. Levaquin stopped. Repeat EKG tomorrow morning.  5. Essential HTN, remains stable.  6. Anxiety, Xanax PRN. 7. Stage 4 pressure injury to sacrum. Evaluated by wound care. Per recommendations silver hydrofiber for absorption as well as antimicrobial properties. Mattress replacement to provide pressure redistribution. Bilateral heel boots for prevention of ulceration as well as alignment properties.  Code Status: Full DVT prophylaxis: SCDs  Family Communication: Discussed with patient who understands and has no concerns at this time. Disposition Plan: Anticipate discharge tomorrow.    Consultants:  WOC  GI  Cardiology  Procedures:    Antibiotics:  Levaquin 11/28  HPI/Subjective: Feels good. Tolerating a normal diet well. Denies any nausea, vomiting, dizziness, or lightheadedness.   Objective: Filed Vitals:   05/16/15 2309 05/17/15 0709  BP: 115/81 132/82  Pulse: 69 73  Temp: 98 F (36.7 C) 98.5 F (36.9 C)  Resp: 20 20    Intake/Output Summary (Last 24 hours) at 05/17/15 0826 Last data filed at 05/17/15 0711  Gross per 24  hour  Intake 1659.17 ml  Output    600 ml  Net 1059.17 ml   Filed Weights   05/15/15 0624 05/15/15 1715  Weight: 70.308 kg (155 lb) 63.8 kg (140 lb 10.5 oz)    Exam:  General: NAD, looks comfortable Cardiovascular: RRR, S1, S2 Respiratory: clear bilaterally, No wheezing, rales or rhonchi Abdomen: soft, non tender, no distention , bowel sounds normal. Small amount of brown colored stool in colostomy bag.  Musculoskeletal: No edema b/l  Data Reviewed: Basic Metabolic Panel:  Recent Labs Lab 05/15/15 0713 05/16/15 0348  NA 138 138  K 3.5 3.4*  CL 97* 102  CO2 29 27  GLUCOSE 148* 93  BUN 17 17  CREATININE 1.01 0.88  CALCIUM 9.3 8.3*  MG  --  1.5*   Liver Function Tests:  Recent Labs Lab 05/15/15 0713 05/16/15 0348  AST 17 13*  ALT 10* 9*  ALKPHOS 81 58  BILITOT 1.0 0.7  PROT 7.1 5.7*  ALBUMIN 3.4* 2.7*    Recent Labs Lab 05/15/15 0820  LIPASE 34   CBC:  Recent Labs Lab 05/15/15 0713 05/15/15 1625 05/15/15 2200 05/16/15 0348 05/17/15 0622  WBC 19.0* 12.8* 11.8* 10.0 9.7  NEUTROABS 17.2*  --   --   --   --   HGB 14.0 13.0 11.8* 11.6* 11.4*  HCT 40.7 37.9* 35.1* 35.5* 34.2*  MCV 83.1 83.3 83.4 84.9 84.9  PLT 301 238 207 238 229     Recent Results (from the past 240 hour(s))  MRSA PCR Screening     Status: Abnormal   Collection Time: 05/15/15 10:10 PM  Result Value Ref Range Status   MRSA by PCR POSITIVE (A) NEGATIVE Final  Comment:        The GeneXpert MRSA Assay (FDA approved for NASAL specimens only), is one component of a comprehensive MRSA colonization surveillance program. It is not intended to diagnose MRSA infection nor to guide or monitor treatment for MRSA infections. RESULT CALLED TO, READ BACK BY AND VERIFIED WITH: Sheron Nightingale AT 0245 ON [id number] BY FORSYTH K      Studies: Dg Abd Acute W/chest  05/15/2015  CLINICAL DATA:  One day history abdominal pain and hematemesis EXAM: DG ABDOMEN ACUTE W/ 1V CHEST COMPARISON:  Chest  radiograph July 05, 2014; CT abdomen and pelvis June 15, 2014 FINDINGS: PA chest: There is no edema or consolidation. The heart is upper normal in size with pulmonary vascularity within normal limits. There is atherosclerotic calcification in the aorta. Pacemaker leads are attached to the right atrium and right ventricle. No pneumothorax. No adenopathy. Supine and upright abdomen: Stomach is distended with air. There is no appreciable small bowel are large bowel dilatation. No free air. There are phleboliths in the pelvis. There is degenerative change in the lumbar spine. There is moderate stool in the colon. IMPRESSION: Stomach distended with air. No small or large bowel obstruction. No free air. No lung edema or consolidation. Electronically Signed   By: Bretta Bang III M.[name].   On: 05/15/2015 14:10    Scheduled Meds: . Chlorhexidine Gluconate Cloth  6 each Topical Q0600  . mupirocin ointment  1 application Nasal BID  . pantoprazole  40 mg Oral BID AC  . sodium chloride  3 mL Intravenous Q12H   Continuous Infusions: . sodium chloride 50 mL/hr at 05/17/15 0532    Principal Problem:   Hematemesis Active Problems:   Atrial fibrillation (HCC)   UTI (lower urinary tract infection)   GIB (gastrointestinal bleeding)   Prolonged QT interval   Paroxysmal atrial fibrillation (HCC)   Tachycardia-bradycardia syndrome (HCC)   Cardiac pacemaker in situ    Time spent: 20 minutes   Teneka Malmberg. MD  [facility] Pager [phone]. If 7PM-7AM, please contact night-coverage at [email or web address], password [facility] 05/17/2015, 8:26 AM  LOS: 2 days     By signing my name below, I, Burnett Harry, attest that this documentation has been prepared under the direction and in the presence of [facility]. MD Electronically Signed: Burnett Harry, Scribe. 05/17/2015 2:03pm  I, Dr. Erick Blinks, personally performed the services described in this documentaiton. All medical record  entries made by the scribe were at my direction and in my presence. I have reviewed the chart and agree that the record reflects my personal performance and is accurate and complete  Erick Blinks, MD, 05/17/2015 2:15 PM

## 2015-05-17 NOTE — Progress Notes (Signed)
**Note De-identified  Obfuscation** Abnormal EKG results reported to RN 

## 2015-05-18 DIAGNOSIS — I495 Sick sinus syndrome: Secondary | ICD-10-CM | POA: Diagnosis not present

## 2015-05-18 DIAGNOSIS — I4581 Long QT syndrome: Secondary | ICD-10-CM | POA: Diagnosis not present

## 2015-05-18 DIAGNOSIS — K922 Gastrointestinal hemorrhage, unspecified: Secondary | ICD-10-CM | POA: Diagnosis not present

## 2015-05-18 DIAGNOSIS — N390 Urinary tract infection, site not specified: Secondary | ICD-10-CM | POA: Diagnosis not present

## 2015-05-18 DIAGNOSIS — Z950 Presence of cardiac pacemaker: Secondary | ICD-10-CM | POA: Diagnosis not present

## 2015-05-18 DIAGNOSIS — K920 Hematemesis: Secondary | ICD-10-CM | POA: Diagnosis not present

## 2015-05-18 DIAGNOSIS — I480 Paroxysmal atrial fibrillation: Secondary | ICD-10-CM | POA: Diagnosis not present

## 2015-05-18 LAB — CBC
HCT: 33.9 % — ABNORMAL LOW (ref 39.0–52.0)
HEMOGLOBIN: 11.3 g/dL — AB (ref 13.0–17.0)
MCH: 28 pg (ref 26.0–34.0)
MCHC: 33.3 g/dL (ref 30.0–36.0)
MCV: 83.9 fL (ref 78.0–100.0)
PLATELETS: 228 10*3/uL (ref 150–400)
RBC: 4.04 MIL/uL — AB (ref 4.22–5.81)
RDW: 14.5 % (ref 11.5–15.5)
WBC: 9.3 10*3/uL (ref 4.0–10.5)

## 2015-05-18 LAB — BASIC METABOLIC PANEL
ANION GAP: 6 (ref 5–15)
BUN: 13 mg/dL (ref 6–20)
CALCIUM: 8.4 mg/dL — AB (ref 8.9–10.3)
CO2: 27 mmol/L (ref 22–32)
Chloride: 103 mmol/L (ref 101–111)
Creatinine, Ser: 1.06 mg/dL (ref 0.61–1.24)
Glucose, Bld: 91 mg/dL (ref 65–99)
Potassium: 3.7 mmol/L (ref 3.5–5.1)
SODIUM: 136 mmol/L (ref 135–145)

## 2015-05-18 MED ORDER — PANTOPRAZOLE SODIUM 40 MG PO TBEC: 40.0000 mg | DELAYED_RELEASE_TABLET | Freq: Two times a day (BID) | ORAL | Status: DC

## 2015-05-18 NOTE — Progress Notes (Signed)
Patient left via EMS for transport home accompanied by son.  Discharge instructions with son.  Patient stable at discharge with foley catheter intact, prevalon boots on, emesis bag with patient but no vomiting today.  Dressing intact to sacrum.  [facility] to follow.

## 2015-05-18 NOTE — Discharge Summary (Signed)
Physician Discharge Summary  Jon [name]:[id number] DOB: 26-Sep-1931 DOA: 05/15/2015  PCP: Milana Obey, MD  Admit date: 05/15/2015 Discharge date: 05/18/2015  Time spent: 35 minutes  Recommendations for Outpatient Follow-up:  1. Follow up with PCP in 1-2 weeks.  2. Follow up with cardiology as an outpatient.  3. Follow up with GI in 4 weeks.    Discharge Diagnoses:  Principal Problem:   Hematemesis Active Problems:   Atrial fibrillation (HCC)   UTI (lower urinary tract infection)   GIB (gastrointestinal bleeding)   Prolonged QT interval   Paroxysmal atrial fibrillation (HCC)   Tachycardia-bradycardia syndrome (HCC)   Cardiac pacemaker in situ   Discharge Condition: Improved  Diet recommendation: Heart healthy  Filed Weights   05/15/15 0624 05/15/15 1715  Weight: 70.308 kg (155 lb) 63.8 kg (140 lb 10.5 oz)    History of present illness:  81 y/o male with a hx of Afib, Dementia, foley catheter, colostomy, and polyneuropathy presented with complaints of nausea, and hematemesis.  Also reported weakness and lower abdominal pain. Denied any rectal bleeding, fever, changes to fever, sore throat, new muscle aches, dysuria, chest pain, SOB or diarrhea. Noted to be on Levaquin since 11/24 for UTI, cultures have been sent by PCP.  While in the ED, noted to have multiple episodes of vomiting coffee-ground emesis with positive gastric occult. Admitted for GI consult and further treatment.   Hospital Course:  Patient presented with hematemesis and generalized abd pain. GI was consulted and performed an EGD on 11/29  which revealed few linear erosions/ulcerations in the distal esophagus and non-erosive gastritis. Biopsies were taken and are still pending. Hgb remained stable. He was started on Protonix BID with significant improvement in his symptoms. He is now tolerating a normal diet well with significant improvement in his vomiting. Will continue on Protonix as an  outpatient.   1. UTI; present upon admission. Treated as outpatient and completed Levaquin. Remained asymptomatic. Chronic indwelling foley noted. 2. Paroxysmal Afib, rate controlled. Cardiology was consulted. Sotalol was initially held due to prolonged QT. It has since been resumed and QT interval appears to be stable. Anticoagulation will be readdressed as an outpatient. 3. Essential HTN, continue outpatient regimen.  4. Anxiety, Xanax PRN. 5. Stage 4 pressure injury to sacrum. Evaluated by wound care. Per recommendations silver hydrofiber for absorption as well as antimicrobial properties. Mattress replacement to provide pressure redistribution. Bilateral heel boots for prevention of ulceration as well as alignment properties.  Consultants:  WOC  GI  Cardiology  Procedures:  EGD 11/29  Discharge Exam: Filed Vitals:   05/17/15 2053 05/18/15 0500  BP: 149/95 117/83  Pulse: 86 72  Temp: 98.8 F (37.1 C) 97.7 F (36.5 C)  Resp: 19 18     General: NAD, looks comfortable  Cardiovascular: RRR, S1, S2   Respiratory: clear bilaterally, No wheezing, rales or rhonchi  Abdomen: soft, non tender, no distention , bowel sounds normal  Musculoskeletal: No edema b/l  Discharge Instructions   Discharge Instructions    Diet - low sodium heart healthy    Complete by:  As directed      Increase activity slowly    Complete by:  As directed           Current Discharge Medication List    START taking these medications   Details  pantoprazole (PROTONIX) 40 MG tablet Take 1 tablet (40 mg total) by mouth 2 (two) times daily before a meal. Qty: 60 tablet, Refills: 1  CONTINUE these medications which have NOT CHANGED   Details  ALPRAZolam (XANAX) 1 MG tablet Take 0.5 tablets (0.5 mg total) by mouth 2 (two) times daily as needed for anxiety. Qty: 30 tablet, Refills: 0    Amino Acids-Protein Hydrolys (FEEDING SUPPLEMENT, PRO-STAT SUGAR FREE 64,) LIQD Take 30 mLs by mouth 3  (three) times daily with meals.    diphenhydrAMINE (BENADRYL) 25 MG tablet Take 25 mg by mouth every 6 (six) hours as needed for itching.    HYDROcodone-acetaminophen (NORCO) 10-325 MG tablet Take 1 tablet by mouth every 6 (six) hours as needed.    !! Multiple Vitamin (MULTIVITAMIN WITH MINERALS) TABS tablet Take 1 tablet by mouth daily.    !! Multiple Vitamins-Minerals (ICAPS AREDS FORMULA PO) Take 1 tablet by mouth daily.    sotalol (BETAPACE) 160 MG tablet TAKE 1 TABLET BY MOUTH TWICE DAILY. Qty: 60 tablet, Refills: 0    Tetrahydrozoline HCl (EYE DROPS OP) Apply 2 drops to eye daily as needed (irritation).     !! - Potential duplicate medications found. Please discuss with provider.    STOP taking these medications     levofloxacin (LEVAQUIN) 500 MG tablet        Allergies  Allergen Reactions  . Iohexol      Desc: PT STATES THROAT/FACE SWELLING W/ IVP DYE IN 1990. PT HAS NOT HAD ANY EXAMS DONE W/DYE SINCE AND HAS NEVER BEEN PRE MEDICATED. PER PT, Onset Date: [id number]   . Shellfish Allergy   . Dilantin [Phenytoin Sodium Extended] Rash  . Tegretol [Carbamazepine] Rash      The results of significant diagnostics from this hospitalization (including imaging, microbiology, ancillary and laboratory) are listed below for reference.    Significant Diagnostic Studies: Dg Abd Acute W/chest  05/15/2015  CLINICAL DATA:  One day history abdominal pain and hematemesis EXAM: DG ABDOMEN ACUTE W/ 1V CHEST COMPARISON:  Chest radiograph July 05, 2014; CT abdomen and pelvis June 15, 2014 FINDINGS: PA chest: There is no edema or consolidation. The heart is upper normal in size with pulmonary vascularity within normal limits. There is atherosclerotic calcification in the aorta. Pacemaker leads are attached to the right atrium and right ventricle. No pneumothorax. No adenopathy. Supine and upright abdomen: Stomach is distended with air. There is no appreciable small bowel are large bowel  dilatation. No free air. There are phleboliths in the pelvis. There is degenerative change in the lumbar spine. There is moderate stool in the colon. IMPRESSION: Stomach distended with air. No small or large bowel obstruction. No free air. No lung edema or consolidation. Electronically Signed   By: Bretta BangWilliam  Woodruff III M.D.   On: 05/15/2015 14:10    Microbiology: Recent Results (from the past 240 hour(s))  MRSA PCR Screening     Status: Abnormal   Collection Time: 05/15/15 10:10 PM  Result Value Ref Range Status   MRSA by PCR POSITIVE (A) NEGATIVE Final    Comment:        The GeneXpert MRSA Assay (FDA approved for NASAL specimens only), is one component of a comprehensive MRSA colonization surveillance program. It is not intended to diagnose MRSA infection nor to guide or monitor treatment for MRSA infections. RESULT CALLED TO, READ BACK BY AND VERIFIED WITH: Sheron NightingaleMARTIN M AT 0245 ON [id number] BY Marveen ReeksFORSYTH K      Labs: Basic Metabolic Panel:  Recent Labs Lab 05/15/15 0713 05/16/15 0348 05/18/15 0637  NA 138 138 136  K 3.5 3.4* 3.7  CL 97*  102 103  CO2 GLUCOSE 148* 93 91  BUN CREATININE 1.01 0.88 1.06  CALCIUM 9.3 8.3* 8.4*  MG  --  1.5*  --    Liver Function Tests:  Recent Labs Lab 05/15/15 0713 05/16/15 0348  AST 17 13*  ALT 10* 9*  ALKPHOS 81 58  BILITOT 1.0 0.7  PROT 7.1 5.7*  ALBUMIN 3.4* 2.7*    Recent Labs Lab 05/15/15 0820  LIPASE 34   CBC:  Recent Labs Lab 05/15/15 0713 05/15/15 1625 05/15/15 2200 05/16/15 0348 05/17/15 0622 05/18/15 0637  WBC 19.0* 12.8* 11.8* 10.0 9.7 9.3  NEUTROABS 17.2*  --   --   --   --   --   HGB 14.0 13.0 11.8* 11.6* 11.4* 11.3*  HCT 40.7 37.9* 35.1* 35.5* 34.2* 33.9*  MCV 83.1 83.3 83.4 84.9 84.9 83.9  PLT 301 238 207 238 229 228     Signed: Kenae Lindquist. MD  [facility] 05/18/2015, 9:29 AM   By signing my name below, I, Burnett Harry, attest that this documentation has been  prepared under the direction and in the presence of [facility]. MD Electronically Signed: Burnett Harry, Scribe. 05/18/2015  I, Dr. Erick Blinks, personally performed the services described in this documentaiton. All medical record entries made by the scribe were at my direction and in my presence. I have reviewed the chart and agree that the record reflects my personal performance and is accurate and complete  Erick Blinks, MD, 05/18/2015 9:29 AM

## 2015-05-18 NOTE — Clinical Social Work Note (Signed)
CSW arranged transport via [facility] as requested by family. CSW verified address and that family would be present upon arrival.  Derenda FennelKara Sonia Stickels, Kentucky[place]CS[place] [phone]

## 2015-05-18 NOTE — Care Management Note (Signed)
Case Management Note  Patient Details  Name: Jon CutterWilliam P [name] MRN: [id number] Date of Birth: 09-27-1931  [facility] home [facility] Pt is aware HH has 48 hours to resume services at DC. [facility] made aware mattress has been ordered for pt. [facility] rep will identify what type of mattress the pt currently has and see if there is a more appropriate mattress for his skin care needs. No further CM needs.    Expected Discharge Date:                   Expected Discharge Plan:  Home w Home Health Services  In-House Referral:  NA  Discharge planning Services  CM Consult  Post Acute Care Choice:  Resumption of Svcs/PTA Provider Choice offered to:  Patient  DME Arranged:    DME Agency:     HH Arranged:  RN, PT, Nurse's Aide HH Agency:  [facility]  Status of Service:  Completed, signed off  Medicare Important Message Given:    Date Medicare IM Given:    Medicare IM give by:    Date Additional Medicare IM Given:    Additional Medicare Important Message give by:     If discussed at Long Length of Stay Meetings, dates discussed:    Additional Comments:  Malcolm MetroChildress, Overton Boggus Demske, RN 05/18/2015, 10:50 AM

## 2015-05-18 NOTE — Progress Notes (Signed)
Central telemetry notified that patient being discharged, telemetry removed.  IV access X 2 removed.  Discharge instructions reviewed with patient and his son, questions answered.  Waiting transportation via EMS.

## 2015-05-18 NOTE — Progress Notes (Signed)
Consulting cardiologist: Cuma Polyakov MD Primary Cardiologist: Thurmon Fair MD  Cardiology Specific Problem List: 1. Prolonged QT interval 2. Atrial fibrillation  Subjective:    Experienced nausea and emesis yesterday after eating lunch. Feels better today, now eating breakfast. No chest pain or palpitations.  Objective:   Temp:  [97.7 F (36.5 C)-98.8 F (37.1 C)] 97.7 F (36.5 C) (12/01 0500) Pulse Rate:  [72-86] 72 (12/01 0500) Resp:  [18-20] 18 (12/01 0500) BP: (117-149)/(83-95) 117/83 mmHg (12/01 0500) SpO2:  [97 %-99 %] 97 % (12/01 0500) Last BM Date:  (colostomy)  Filed Weights   05/15/15 0624 05/15/15 1715  Weight: 155 lb (70.308 kg) 140 lb 10.5 oz (63.8 kg)    Intake/Output Summary (Last 24 hours) at 05/18/15 0809 Last data filed at 05/18/15 0313  Gross per 24 hour  Intake    243 ml  Output    650 ml  Net   -407 ml    Telemetry: Sinus rhythm.  Exam:  General: Disheveled male, no distress..  Lungs: Diminished at the bases, no crackles.   Cardiac: RRR, no gallop or rub.   Abdomen: Normoactive bowel sounds, nontender, nondistended. Colostomy on the left.   Extremities: No pitting edema, distal pulses full. Foot drop devices.   Lab Results:  Basic Metabolic Panel:  Recent Labs Lab 05/15/15 0713 05/16/15 0348 05/18/15 0637  NA 138 138 136  K 3.5 3.4* 3.7  CL 97* 102 103  CO2 GLUCOSE 148* 93 91  BUN CREATININE 1.01 0.88 1.06  CALCIUM 9.3 8.3* 8.4*  MG  --  1.5*  --     CBC:  Recent Labs Lab 05/16/15 0348 05/17/15 0622 05/18/15 0637  WBC 10.0 9.7 9.3  HGB 11.6* 11.4* 11.3*  HCT 35.5* 34.2* 33.9*  MCV 84.9 84.9 83.9  PLT 238 229 228    ECG: Atrial paced rhythm with incomplete right bundle branch block, QTc 462 ms.   Medications:   Scheduled Medications: . Chlorhexidine Gluconate Cloth  6 each Topical Q0600  . docusate sodium  200 mg Oral QHS  . mupirocin ointment  1 application Nasal BID  .  pantoprazole  40 mg Oral BID AC  . sodium chloride  3 mL Intravenous Q12H  . sotalol  160 mg Oral Q12H    Infusions: . sodium chloride 50 mL/hr at 05/17/15 2044    PRN Medications: acetaminophen **OR** acetaminophen, ALPRAZolam, diphenhydrAMINE, HYDROcodone-acetaminophen, morphine injection, ondansetron **OR** ondansetron (ZOFRAN) IV   Assessment and Plan:   1. Prolonged QT interval: Improved with discontinuation of Levaquin. Sotalol was resumed yesterday at 160 mg twice daily which was the previous dose. QTc is within reasonable range today. As noted previously, this has typically run around 500 ms.  2. PAF: Currently in an atrial paced rhythm. CHADS VASC Score of 3. He is currently off anticoagulation - has actually been the case since January, do not plan to resume down with recent GI bleeding. Can discuss further as an outpatient.  3. [employer] PPM: Continue interrogations as planned and follow up with EP. He wishes to be followed in [place] now for EP and cardiology management instead of [place]. Will make arrangements for this prior to discharge.   4. Gastritis and erosions of the distal esophagus: Followed by GI  Bettey Mare. LawreNona Dell12/06/2014, 8:09 AM    Attending note:  Patient seen and examined. Reviewed interval history and modified above note by Ms. Lyman Bishop NP  to reflect my findings and recommendations. QTc is within good range today on sotalol which was resumed yesterday at the previous dose of 160 mg twice daily. Not starting anticoagulation at this time, can review further as an outpatient. [facility], we will make arrangements.  Jonelle SidleSamuel G. Tanji Storrs, M.D., F.A.C.C.

## 2015-05-19 ENCOUNTER — Encounter (HOSPITAL_COMMUNITY): Payer: Self-pay | Admitting: Gastroenterology

## 2015-06-01 ENCOUNTER — Other Ambulatory Visit: Payer: Self-pay | Admitting: Cardiovascular Disease

## 2015-06-05 ENCOUNTER — Other Ambulatory Visit: Payer: Self-pay | Admitting: Cardiovascular Disease

## 2015-06-05 NOTE — Telephone Encounter (Signed)
°*  STAT* If patient is at the pharmacy, call can be transferred to refill team.  1. Which medications need to be refilled? (please list name of each medication and dose if known) Sotalol-please call today 2. Which pharmacy/location (including street and city if local pharmacy) is medication to be sent to? [facility]-[phone]  3. Do they need a 30 day or 90 day supply? 60 and refills

## 2015-07-03 ENCOUNTER — Encounter: Payer: Medicare Other | Admitting: Cardiology

## 2015-08-09 ENCOUNTER — Other Ambulatory Visit (HOSPITAL_COMMUNITY)
Admission: AD | Admit: 2015-08-09 | Discharge: 2015-08-09 | Disposition: A | Discharge status: Home / Self Care | Payer: Medicare Other | Source: Other Acute Inpatient Hospital | Attending: Family Medicine | Admitting: Family Medicine

## 2015-08-09 DIAGNOSIS — N390 Urinary tract infection, site not specified: Secondary | ICD-10-CM | POA: Diagnosis present

## 2015-08-09 LAB — URINALYSIS, ROUTINE W REFLEX MICROSCOPIC
BILIRUBIN URINE: NEGATIVE
GLUCOSE, UA: NEGATIVE mg/dL
KETONES UR: NEGATIVE mg/dL
Nitrite: POSITIVE — AB
Specific Gravity, Urine: 1.02 (ref 1.005–1.030)
pH: 8 (ref 5.0–8.0)

## 2015-08-09 LAB — URINE MICROSCOPIC-ADD ON

## 2015-08-11 LAB — URINE CULTURE

## 2015-08-17 ENCOUNTER — Telehealth: Payer: Self-pay | Admitting: Cardiovascular Disease

## 2015-08-17 NOTE — Telephone Encounter (Signed)
Pt unable to come in to office for device check. I will schedule remote pacer check for Monday 08/21/15. Pt will call [place] office to schedule device check with Dr. Ladona Ridgel for convenience sake when he is able to walk.

## 2015-08-17 NOTE — Telephone Encounter (Signed)
Pt wants to know when does he need to do his next remote check over the phone?

## 2015-08-21 ENCOUNTER — Ambulatory Visit (INDEPENDENT_AMBULATORY_CARE_PROVIDER_SITE_OTHER): Payer: Medicare Other | Admitting: *Deleted

## 2015-08-21 DIAGNOSIS — I480 Paroxysmal atrial fibrillation: Secondary | ICD-10-CM

## 2015-08-23 ENCOUNTER — Telehealth: Payer: Self-pay | Admitting: Cardiovascular Disease

## 2015-08-23 NOTE — Telephone Encounter (Signed)
Spoke with Mr. Jon [name]- transmission reviewed. Pt wanted to know AF burnden- 26%- improved from 38% in September. Pt made aware that we will send him a letter once transmission has been documented- he is appreciative.

## 2015-08-23 NOTE — Telephone Encounter (Signed)
He wants to know if you received his transmission from Monday?

## 2015-08-25 NOTE — Progress Notes (Signed)
Remote pacemaker transmission.   

## 2015-08-26 LAB — CUP PACEART REMOTE DEVICE CHECK
Brady Statistic AS VS Percent: 13 %
Implantable Lead Implant Date: 20060815
Implantable Lead Location: 753859
Implantable Lead Model: 5594
Lead Channel Impedance Value: 645 Ohm
Lead Channel Setting Pacing Amplitude: 2.5 V
Lead Channel Setting Sensing Sensitivity: 2 mV
MDC IDC LEAD IMPLANT DT: 20060815
MDC IDC LEAD LOCATION: 753860
MDC IDC MSMT BATTERY IMPEDANCE: 257 Ohm
MDC IDC MSMT BATTERY REMAINING LONGEVITY: 88 mo
MDC IDC MSMT BATTERY VOLTAGE: 2.79 V
MDC IDC MSMT LEADCHNL RA IMPEDANCE VALUE: 447 Ohm
MDC IDC SESS DTM: 20170306203659
MDC IDC SET LEADCHNL RA PACING AMPLITUDE: 3.75 V
MDC IDC SET LEADCHNL RV PACING PULSEWIDTH: 0.4 ms
MDC IDC STAT BRADY AP VP PERCENT: 7 %
MDC IDC STAT BRADY AP VS PERCENT: 74 %
MDC IDC STAT BRADY AS VP PERCENT: 6 %

## 2015-08-26 NOTE — Progress Notes (Signed)
Normal remote reviewed. 26% AF, on Sotalol and Warfarin per Dr C last office note.  Next [facility] 11/20/15

## 2015-08-29 ENCOUNTER — Encounter: Payer: Self-pay | Admitting: Internal Medicine

## 2015-08-30 ENCOUNTER — Encounter: Payer: Self-pay | Admitting: Cardiology

## 2015-09-19 ENCOUNTER — Other Ambulatory Visit (HOSPITAL_COMMUNITY)
Admission: AD | Admit: 2015-09-19 | Discharge: 2015-09-19 | Disposition: A | Discharge status: Home / Self Care | Payer: Medicare Other | Source: Other Acute Inpatient Hospital | Attending: Family Medicine | Admitting: Family Medicine

## 2015-09-19 DIAGNOSIS — N390 Urinary tract infection, site not specified: Secondary | ICD-10-CM | POA: Insufficient documentation

## 2015-09-19 LAB — URINALYSIS, ROUTINE W REFLEX MICROSCOPIC
Bilirubin Urine: NEGATIVE
GLUCOSE, UA: NEGATIVE mg/dL
Ketones, ur: NEGATIVE mg/dL
Nitrite: POSITIVE — AB
PH: 8 (ref 5.0–8.0)
PROTEIN: 100 mg/dL — AB
Specific Gravity, Urine: 1.015 (ref 1.005–1.030)

## 2015-09-19 LAB — URINE MICROSCOPIC-ADD ON

## 2015-09-23 LAB — URINE CULTURE: Culture: >=100000 — AB

## 2015-10-18 ENCOUNTER — Other Ambulatory Visit (HOSPITAL_COMMUNITY)
Admission: RE | Admit: 2015-10-18 | Discharge: 2015-10-18 | Disposition: A | Discharge status: Home / Self Care | Payer: Medicare Other | Source: Skilled Nursing Facility | Attending: Family Medicine | Admitting: Family Medicine

## 2015-10-18 DIAGNOSIS — N390 Urinary tract infection, site not specified: Secondary | ICD-10-CM | POA: Insufficient documentation

## 2015-10-18 LAB — URINALYSIS, ROUTINE W REFLEX MICROSCOPIC
GLUCOSE, UA: NEGATIVE mg/dL
Ketones, ur: NEGATIVE mg/dL
Nitrite: POSITIVE — AB
PH: 8 (ref 5.0–8.0)
Protein, ur: 100 mg/dL — AB
SPECIFIC GRAVITY, URINE: 1.015 (ref 1.005–1.030)

## 2015-10-18 LAB — URINE MICROSCOPIC-ADD ON: Squamous Epithelial / LPF: NONE SEEN

## 2015-10-23 LAB — URINE CULTURE

## 2015-11-20 ENCOUNTER — Telehealth: Payer: Self-pay | Admitting: Cardiology

## 2015-11-20 ENCOUNTER — Ambulatory Visit (INDEPENDENT_AMBULATORY_CARE_PROVIDER_SITE_OTHER): Payer: Medicare Other | Admitting: *Deleted

## 2015-11-20 DIAGNOSIS — I480 Paroxysmal atrial fibrillation: Secondary | ICD-10-CM | POA: Diagnosis not present

## 2015-11-20 NOTE — Telephone Encounter (Signed)
Spoke with pt and reminded pt of remote transmission that is due today. Pt verbalized understanding.   

## 2015-11-21 NOTE — Progress Notes (Signed)
Remote pacemaker transmission.   

## 2015-11-27 ENCOUNTER — Other Ambulatory Visit (HOSPITAL_COMMUNITY)
Admission: RE | Admit: 2015-11-27 | Discharge: 2015-11-27 | Disposition: A | Discharge status: Home / Self Care | Payer: Medicare Other | Source: Other Acute Inpatient Hospital | Attending: Family Medicine | Admitting: Family Medicine

## 2015-11-27 DIAGNOSIS — N390 Urinary tract infection, site not specified: Secondary | ICD-10-CM | POA: Diagnosis present

## 2015-11-27 LAB — URINALYSIS, ROUTINE W REFLEX MICROSCOPIC
Bilirubin Urine: NEGATIVE
Glucose, UA: NEGATIVE mg/dL
Ketones, ur: NEGATIVE mg/dL
NITRITE: POSITIVE — AB
Specific Gravity, Urine: 1.015 (ref 1.005–1.030)
pH: 8.5 — ABNORMAL HIGH (ref 5.0–8.0)

## 2015-11-27 LAB — URINE MICROSCOPIC-ADD ON

## 2015-11-30 LAB — URINE CULTURE

## 2015-12-01 LAB — CUP PACEART REMOTE DEVICE CHECK
Battery Remaining Longevity: 77 mo
Battery Voltage: 2.78 V
Brady Statistic AS VP Percent: 5 %
Date Time Interrogation Session: 20170605183941
Implantable Lead Implant Date: 20060815
Implantable Lead Implant Date: 20060815
Implantable Lead Location: 753859
Implantable Lead Location: 753860
Implantable Lead Model: 4092
Lead Channel Impedance Value: 455 Ohm
Lead Channel Pacing Threshold Amplitude: 0.75 V
Lead Channel Pacing Threshold Pulse Width: 0.4 ms
Lead Channel Sensing Intrinsic Amplitude: 5.6 mV
Lead Channel Setting Pacing Amplitude: 4.5 V
Lead Channel Setting Pacing Pulse Width: 0.4 ms
Lead Channel Setting Sensing Sensitivity: 2 mV
MDC IDC MSMT BATTERY IMPEDANCE: 307 Ohm
MDC IDC MSMT LEADCHNL RA PACING THRESHOLD AMPLITUDE: 2.25 V
MDC IDC MSMT LEADCHNL RA PACING THRESHOLD PULSEWIDTH: 0.4 ms
MDC IDC MSMT LEADCHNL RV IMPEDANCE VALUE: 695 Ohm
MDC IDC SET LEADCHNL RV PACING AMPLITUDE: 2.5 V
MDC IDC STAT BRADY AP VP PERCENT: 5 %
MDC IDC STAT BRADY AP VS PERCENT: 80 %
MDC IDC STAT BRADY AS VS PERCENT: 10 %

## 2015-12-06 ENCOUNTER — Encounter: Payer: Self-pay | Admitting: Cardiology

## 2016-01-26 IMAGING — DX DG ABDOMEN ACUTE W/ 1V CHEST
3 series · 3 of 3 positions shown · non-contrast
Comparison: Chest radiograph July 05, 2014; CT abdomen and
pelvis June 15, 2014

CLINICAL DATA: One day history abdominal pain and hematemesis

EXAM:
DG ABDOMEN ACUTE W/ 1V CHEST

[abdomen erect]
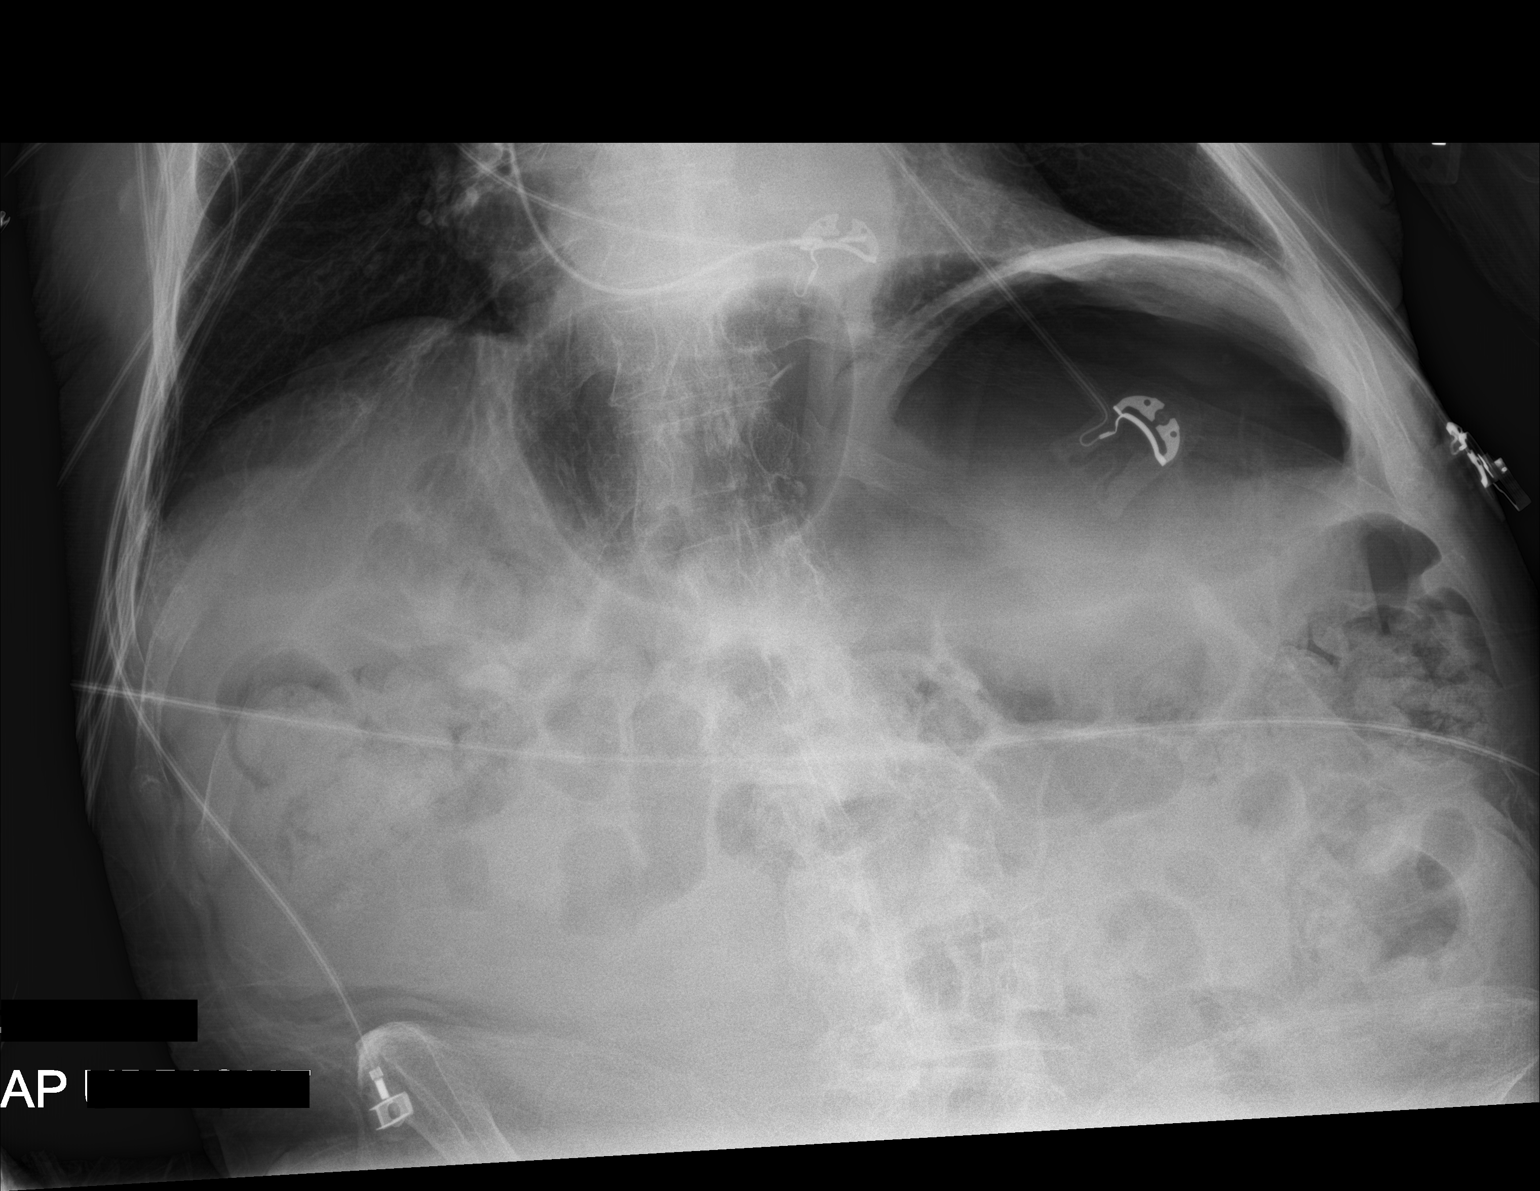

[abdomen supine]
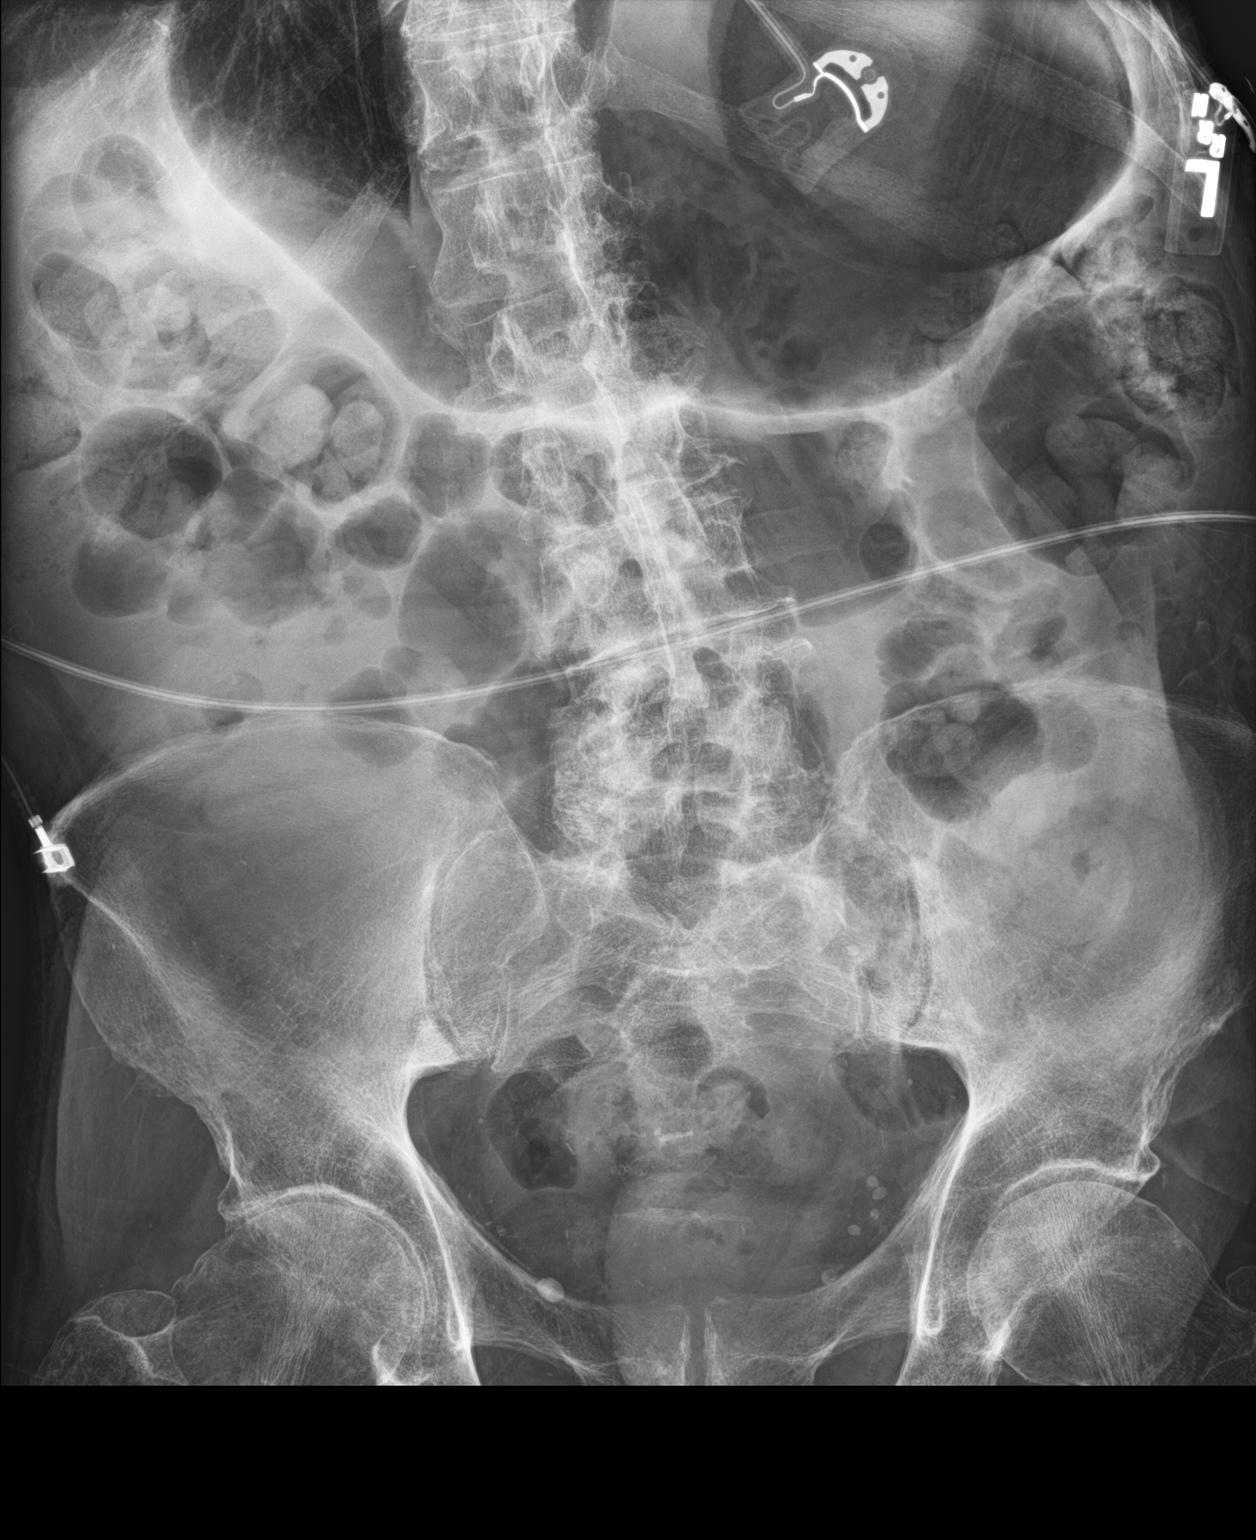

[chest ap]
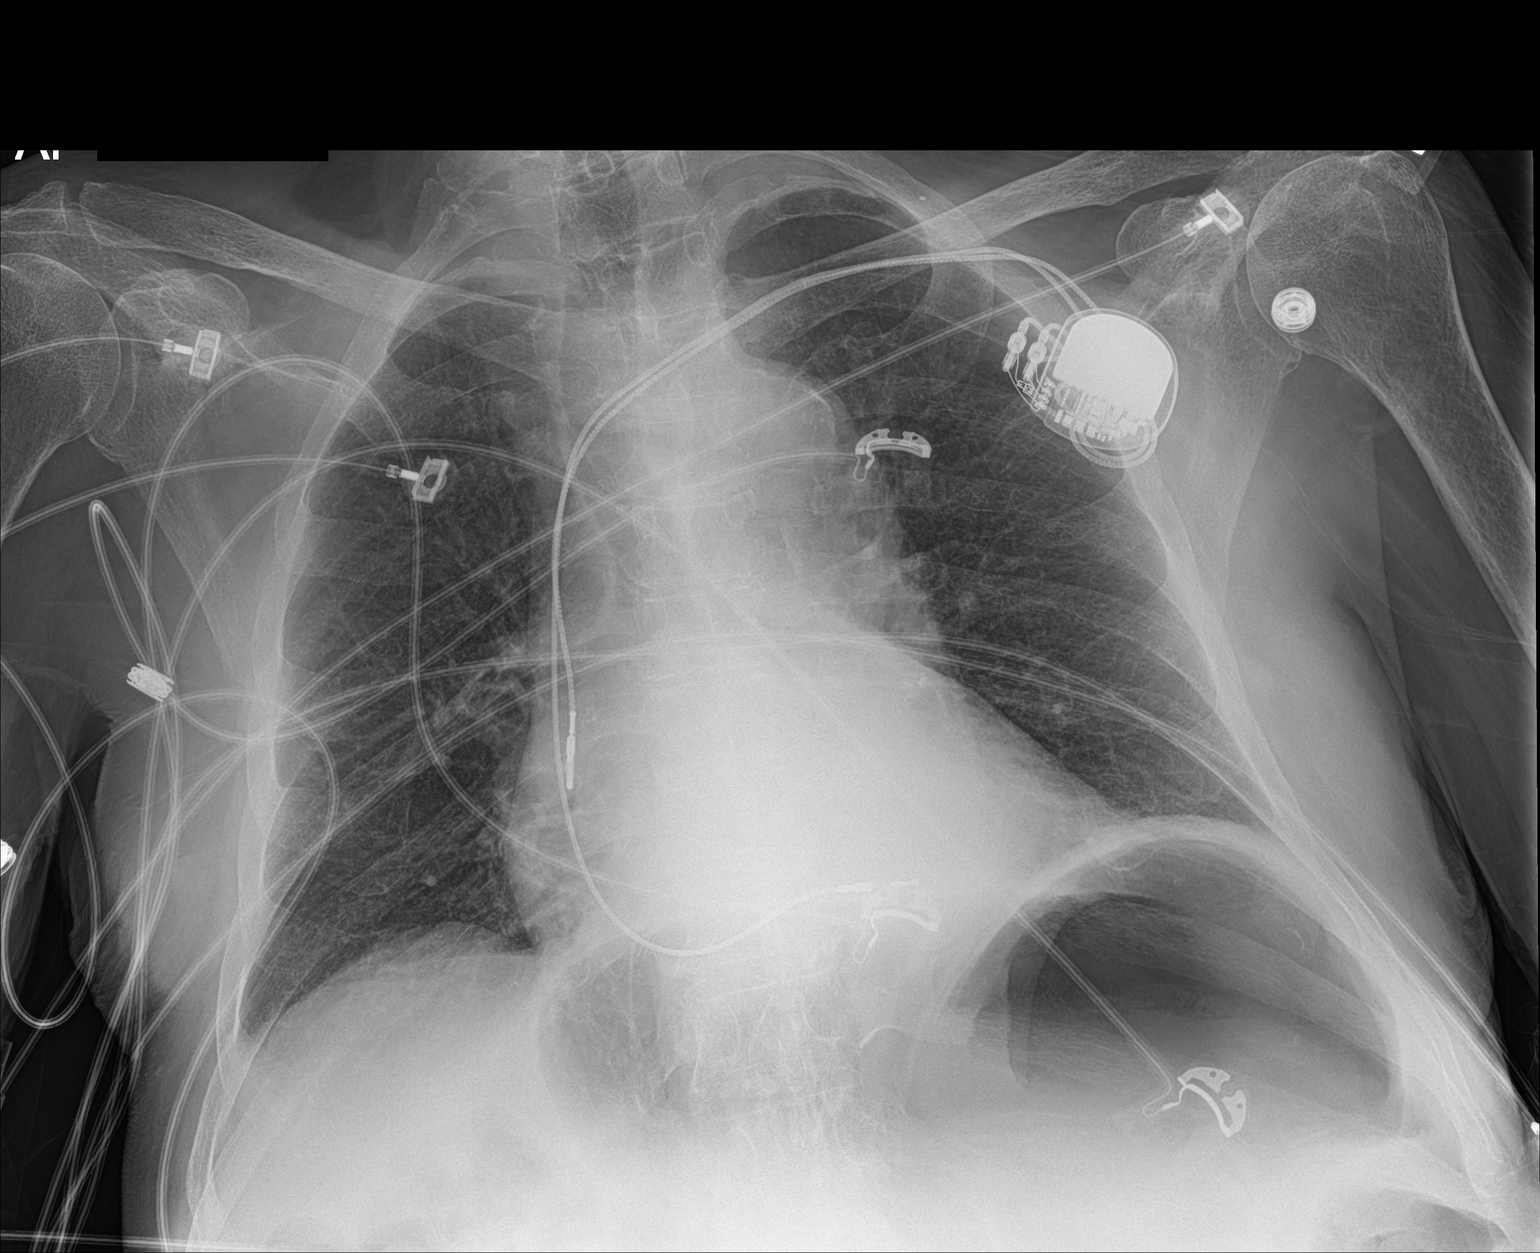

[3 of 3 positions shown; findings below may reference images not displayed]

FINDINGS: PA chest: There is no edema or consolidation. The heart is upper
normal in size with pulmonary vascularity within normal limits.
There is atherosclerotic calcification in the aorta. Pacemaker leads
are attached to the right atrium and right ventricle. No
pneumothorax. No adenopathy.

Supine and upright abdomen: Stomach is distended with air. There is
no appreciable small bowel are large bowel dilatation. No free air.
There are phleboliths in the pelvis. There is degenerative change in
the lumbar spine. There is moderate stool in the colon.
IMPRESSION: Stomach distended with air. No small or large bowel obstruction. No
free air. No lung edema or consolidation.

## 2016-02-20 ENCOUNTER — Telehealth: Payer: Self-pay | Admitting: Cardiology

## 2016-02-20 ENCOUNTER — Ambulatory Visit (INDEPENDENT_AMBULATORY_CARE_PROVIDER_SITE_OTHER): Payer: Medicare Other | Admitting: *Deleted

## 2016-02-20 DIAGNOSIS — I480 Paroxysmal atrial fibrillation: Secondary | ICD-10-CM

## 2016-02-20 NOTE — Telephone Encounter (Signed)
LMOVM reminding pt to send remote transmission.   

## 2016-02-21 NOTE — Progress Notes (Signed)
Remote pacemaker transmission.   

## 2016-02-22 LAB — CUP PACEART REMOTE DEVICE CHECK
Battery Impedance: 380 Ohm
Battery Voltage: 2.78 V
Brady Statistic AP VP Percent: 4 %
Brady Statistic AP VS Percent: 84 %
Brady Statistic AS VP Percent: 4 %
Date Time Interrogation Session: 20170905163620
Implantable Lead Implant Date: 20060815
Implantable Lead Location: 753860
Implantable Lead Model: 4092
Implantable Lead Model: 5594
Lead Channel Impedance Value: 447 Ohm
Lead Channel Impedance Value: 691 Ohm
Lead Channel Pacing Threshold Amplitude: 1.875 V
Lead Channel Pacing Threshold Pulse Width: 0.4 ms
Lead Channel Setting Pacing Amplitude: 2.5 V
MDC IDC LEAD IMPLANT DT: 20060815
MDC IDC LEAD LOCATION: 753859
MDC IDC MSMT BATTERY REMAINING LONGEVITY: 74 mo
MDC IDC MSMT LEADCHNL RV PACING THRESHOLD AMPLITUDE: 0.75 V
MDC IDC MSMT LEADCHNL RV PACING THRESHOLD PULSEWIDTH: 0.4 ms
MDC IDC MSMT LEADCHNL RV SENSING INTR AMPL: 5.6 mV
MDC IDC SET LEADCHNL RA PACING AMPLITUDE: 3.75 V
MDC IDC SET LEADCHNL RV PACING PULSEWIDTH: 0.4 ms
MDC IDC SET LEADCHNL RV SENSING SENSITIVITY: 2 mV
MDC IDC STAT BRADY AS VS PERCENT: 8 %

## 2016-02-23 ENCOUNTER — Encounter: Payer: Self-pay | Admitting: Cardiology

## 2016-05-02 ENCOUNTER — Encounter (HOSPITAL_COMMUNITY): Payer: Self-pay | Admitting: Emergency Medicine

## 2016-05-02 ENCOUNTER — Emergency Department (HOSPITAL_COMMUNITY): Payer: Medicare Other

## 2016-05-02 ENCOUNTER — Inpatient Hospital Stay (HOSPITAL_COMMUNITY): Payer: Medicare Other

## 2016-05-02 ENCOUNTER — Inpatient Hospital Stay (HOSPITAL_COMMUNITY)
Admission: EM | Admit: 2016-05-02 | Discharge: 2016-05-06 | DRG: 698 | Disposition: A | Discharge status: Home Health | Payer: Medicare Other | Attending: Internal Medicine | Admitting: Internal Medicine

## 2016-05-02 DIAGNOSIS — T83511A Infection and inflammatory reaction due to indwelling urethral catheter, initial encounter: Principal | ICD-10-CM | POA: Diagnosis present

## 2016-05-02 DIAGNOSIS — Z8249 Family history of ischemic heart disease and other diseases of the circulatory system: Secondary | ICD-10-CM | POA: Diagnosis not present

## 2016-05-02 DIAGNOSIS — Z66 Do not resuscitate: Secondary | ICD-10-CM | POA: Diagnosis present

## 2016-05-02 DIAGNOSIS — A419 Sepsis, unspecified organism: Secondary | ICD-10-CM | POA: Diagnosis present

## 2016-05-02 DIAGNOSIS — I480 Paroxysmal atrial fibrillation: Secondary | ICD-10-CM | POA: Diagnosis present

## 2016-05-02 DIAGNOSIS — I4891 Unspecified atrial fibrillation: Secondary | ICD-10-CM | POA: Diagnosis present

## 2016-05-02 DIAGNOSIS — Z7901 Long term (current) use of anticoagulants: Secondary | ICD-10-CM

## 2016-05-02 DIAGNOSIS — Z87440 Personal history of urinary (tract) infections: Secondary | ICD-10-CM | POA: Diagnosis not present

## 2016-05-02 DIAGNOSIS — R110 Nausea: Secondary | ICD-10-CM

## 2016-05-02 DIAGNOSIS — E860 Dehydration: Secondary | ICD-10-CM | POA: Diagnosis present

## 2016-05-02 DIAGNOSIS — Y846 Urinary catheterization as the cause of abnormal reaction of the patient, or of later complication, without mention of misadventure at the time of the procedure: Secondary | ICD-10-CM | POA: Diagnosis present

## 2016-05-02 DIAGNOSIS — F419 Anxiety disorder, unspecified: Secondary | ICD-10-CM | POA: Diagnosis present

## 2016-05-02 DIAGNOSIS — Z950 Presence of cardiac pacemaker: Secondary | ICD-10-CM

## 2016-05-02 DIAGNOSIS — Z820 Family history of epilepsy and other diseases of the nervous system: Secondary | ICD-10-CM | POA: Diagnosis not present

## 2016-05-02 DIAGNOSIS — Z7401 Bed confinement status: Secondary | ICD-10-CM

## 2016-05-02 DIAGNOSIS — R197 Diarrhea, unspecified: Secondary | ICD-10-CM | POA: Diagnosis present

## 2016-05-02 DIAGNOSIS — I10 Essential (primary) hypertension: Secondary | ICD-10-CM | POA: Diagnosis present

## 2016-05-02 DIAGNOSIS — N390 Urinary tract infection, site not specified: Secondary | ICD-10-CM | POA: Diagnosis not present

## 2016-05-02 DIAGNOSIS — E876 Hypokalemia: Secondary | ICD-10-CM | POA: Diagnosis present

## 2016-05-02 DIAGNOSIS — I959 Hypotension, unspecified: Secondary | ICD-10-CM

## 2016-05-02 DIAGNOSIS — N179 Acute kidney failure, unspecified: Secondary | ICD-10-CM | POA: Diagnosis present

## 2016-05-02 DIAGNOSIS — Z9181 History of falling: Secondary | ICD-10-CM

## 2016-05-02 DIAGNOSIS — I509 Heart failure, unspecified: Secondary | ICD-10-CM | POA: Diagnosis not present

## 2016-05-02 DIAGNOSIS — G4733 Obstructive sleep apnea (adult) (pediatric): Secondary | ICD-10-CM | POA: Diagnosis present

## 2016-05-02 DIAGNOSIS — Z933 Colostomy status: Secondary | ICD-10-CM

## 2016-05-02 DIAGNOSIS — H3530 Unspecified macular degeneration: Secondary | ICD-10-CM | POA: Diagnosis present

## 2016-05-02 DIAGNOSIS — Z91013 Allergy to seafood: Secondary | ICD-10-CM

## 2016-05-02 DIAGNOSIS — N136 Pyonephrosis: Secondary | ICD-10-CM | POA: Diagnosis present

## 2016-05-02 DIAGNOSIS — N1330 Unspecified hydronephrosis: Secondary | ICD-10-CM | POA: Diagnosis present

## 2016-05-02 DIAGNOSIS — Z87891 Personal history of nicotine dependence: Secondary | ICD-10-CM

## 2016-05-02 DIAGNOSIS — R0602 Shortness of breath: Secondary | ICD-10-CM

## 2016-05-02 DIAGNOSIS — Z800 Family history of malignant neoplasm of digestive organs: Secondary | ICD-10-CM | POA: Diagnosis not present

## 2016-05-02 DIAGNOSIS — I495 Sick sinus syndrome: Secondary | ICD-10-CM | POA: Diagnosis not present

## 2016-05-02 DIAGNOSIS — R109 Unspecified abdominal pain: Secondary | ICD-10-CM

## 2016-05-02 DIAGNOSIS — Z8711 Personal history of peptic ulcer disease: Secondary | ICD-10-CM

## 2016-05-02 LAB — ECHOCARDIOGRAM COMPLETE
CHL CUP DOP CALC LVOT VTI: 13.3 cm
E decel time: 229 msec
E/e' ratio: 11.48
FS: 29 % (ref 28–44)
HEIGHTINCHES: 70 in
IVS/LV PW RATIO, ED: 1.02
LA ID, A-P, ES: 39 mm
LA diam end sys: 39 mm
LA diam index: 2.23 cm/m2
LA vol A4C: 85.2 ml
LDCA: 2.54 cm2
LV E/e' medial: 11.48
LV E/e'average: 11.48
LV TDI E'LATERAL: 5.55
LV TDI E'MEDIAL: 4.43
LVELAT: 5.55 cm/s
LVOT peak grad rest: 1 mmHg
LVOTD: 18 mm
LVOTPV: 60 cm/s
LVOTSV: 34 mL
MV Dec: 229
MV pk E vel: 63.7 m/s
MVPKAVEL: 35.8 m/s
PW: 11.3 mm — AB (ref 0.6–1.1)
RV TAPSE: 18 mm
Reg peak vel: 273 cm/s
TR max vel: 273 cm/s
WEIGHTICAEL: 2201.07 [oz_av]

## 2016-05-02 LAB — COMPREHENSIVE METABOLIC PANEL
ALT: 11 U/L — ABNORMAL LOW (ref 17–63)
AST: 26 U/L (ref 15–41)
Albumin: 2.4 g/dL — ABNORMAL LOW (ref 3.5–5.0)
Alkaline Phosphatase: 42 U/L (ref 38–126)
Anion gap: 8 (ref 5–15)
BILIRUBIN TOTAL: 1 mg/dL (ref 0.3–1.2)
BUN: 45 mg/dL — AB (ref 6–20)
CO2: 18 mmol/L — ABNORMAL LOW (ref 22–32)
Calcium: 7.7 mg/dL — ABNORMAL LOW (ref 8.9–10.3)
Chloride: 107 mmol/L (ref 101–111)
Creatinine, Ser: 1.95 mg/dL — ABNORMAL HIGH (ref 0.61–1.24)
GFR, EST AFRICAN AMERICAN: 35 mL/min — AB (ref >60)
GFR, EST NON AFRICAN AMERICAN: 30 mL/min — AB (ref >60)
Glucose, Bld: 74 mg/dL (ref 65–99)
POTASSIUM: 3.4 mmol/L — AB (ref 3.5–5.1)
Sodium: 133 mmol/L — ABNORMAL LOW (ref 135–145)
TOTAL PROTEIN: 4.4 g/dL — AB (ref 6.5–8.1)

## 2016-05-02 LAB — URINALYSIS, ROUTINE W REFLEX MICROSCOPIC
BILIRUBIN URINE: NEGATIVE
GLUCOSE, UA: NEGATIVE mg/dL
KETONES UR: NEGATIVE mg/dL
NITRITE: NEGATIVE
PH: 7.5 (ref 5.0–8.0)
PROTEIN: 30 mg/dL — AB
Specific Gravity, Urine: <1.005 — ABNORMAL LOW (ref 1.005–1.030)

## 2016-05-02 LAB — CBC WITH DIFFERENTIAL/PLATELET
Basophils Absolute: 0 10*3/uL (ref 0.0–0.1)
Basophils Relative: 0 %
Eosinophils Absolute: 0 10*3/uL (ref 0.0–0.7)
Eosinophils Relative: 0 %
HEMATOCRIT: 32.5 % — AB (ref 39.0–52.0)
Hemoglobin: 10.7 g/dL — ABNORMAL LOW (ref 13.0–17.0)
LYMPHS ABS: 0.4 10*3/uL — AB (ref 0.7–4.0)
Lymphocytes Relative: 7 %
MCH: 30.2 pg (ref 26.0–34.0)
MCHC: 32.9 g/dL (ref 30.0–36.0)
MCV: 91.8 fL (ref 78.0–100.0)
MONO ABS: 0.2 10*3/uL (ref 0.1–1.0)
MONOS PCT: 3 %
NEUTROS ABS: 4.7 10*3/uL (ref 1.7–7.7)
Neutrophils Relative %: 90 %
PLATELETS: 65 10*3/uL — AB (ref 150–400)
RBC: 3.54 MIL/uL — ABNORMAL LOW (ref 4.22–5.81)
RDW: 15.3 % (ref 11.5–15.5)
WBC Morphology: INCREASED
WBC: 5.3 10*3/uL (ref 4.0–10.5)

## 2016-05-02 LAB — URINE MICROSCOPIC-ADD ON: Squamous Epithelial / LPF: NONE SEEN

## 2016-05-02 LAB — C DIFFICILE QUICK SCREEN W PCR REFLEX
C Diff antigen: POSITIVE — AB
C Diff toxin: NEGATIVE

## 2016-05-02 LAB — PROTIME-INR
INR: 2.01
PROTHROMBIN TIME: 23.1 s — AB (ref 11.4–15.2)

## 2016-05-02 LAB — TROPONIN I: Troponin I: 0.03 ng/mL (ref <0.03)

## 2016-05-02 LAB — POC OCCULT BLOOD, ED: Fecal Occult Bld: NEGATIVE

## 2016-05-02 LAB — LACTIC ACID, PLASMA
LACTIC ACID, VENOUS: 2.9 mmol/L — AB (ref 0.5–1.9)
Lactic Acid, Venous: 3.8 mmol/L (ref 0.5–1.9)

## 2016-05-02 LAB — CLOSTRIDIUM DIFFICILE BY PCR: Toxigenic C. Difficile by PCR: POSITIVE — AB

## 2016-05-02 LAB — LIPASE, BLOOD: LIPASE: 19 U/L (ref 11–51)

## 2016-05-02 LAB — MAGNESIUM: Magnesium: 1.4 mg/dL — ABNORMAL LOW (ref 1.7–2.4)

## 2016-05-02 LAB — MRSA PCR SCREENING: MRSA by PCR: POSITIVE — AB

## 2016-05-02 MED ORDER — VANCOMYCIN 50 MG/ML ORAL SOLUTION
250.0000 mg | Freq: Four times a day (QID) | ORAL | Status: DC
  Administered 2016-05-02 – 2016-05-04 (×8): 250 mg via ORAL
  Filled 2016-05-02 (×17): qty 5

## 2016-05-02 MED ORDER — CEFTRIAXONE SODIUM 1 G IJ SOLR
1.0000 g | INTRAMUSCULAR | Status: DC
  Administered 2016-05-02 – 2016-05-05 (×4): 1 g via INTRAVENOUS
  Filled 2016-05-02 (×7): qty 10

## 2016-05-02 MED ORDER — TETRAHYDROZOLINE HCL 0.05 % OP SOLN
1.0000 [drp] | Freq: Three times a day (TID) | OPHTHALMIC | Status: DC
  Filled 2016-05-02: qty 15

## 2016-05-02 MED ORDER — SODIUM CHLORIDE 0.9 % IV BOLUS (SEPSIS)
1000.0000 mL | Freq: Once | INTRAVENOUS | Status: AC
  Administered 2016-05-02: 1000 mL via INTRAVENOUS

## 2016-05-02 MED ORDER — SODIUM CHLORIDE 0.9% FLUSH
3.0000 mL | Freq: Two times a day (BID) | INTRAVENOUS | Status: DC
  Administered 2016-05-02 – 2016-05-05 (×5): 3 mL via INTRAVENOUS

## 2016-05-02 MED ORDER — METHYLPREDNISOLONE SODIUM SUCC 125 MG IJ SOLR
125.0000 mg | Freq: Once | INTRAMUSCULAR | Status: AC
  Administered 2016-05-02: 125 mg via INTRAVENOUS
  Filled 2016-05-02: qty 2

## 2016-05-02 MED ORDER — ALPRAZOLAM 0.5 MG PO TABS: 0.5000 mg | ORAL_TABLET | Freq: Two times a day (BID) | ORAL | Status: DC | PRN

## 2016-05-02 MED ORDER — ACETAMINOPHEN 325 MG PO TABS: 650.0000 mg | ORAL_TABLET | Freq: Four times a day (QID) | ORAL | Status: DC | PRN

## 2016-05-02 MED ORDER — MIDODRINE HCL 5 MG PO TABS: 10.0000 mg | ORAL_TABLET | Freq: Three times a day (TID) | ORAL | Status: DC

## 2016-05-02 MED ORDER — HYDROCORTISONE NA SUCCINATE PF 100 MG IJ SOLR
INTRAMUSCULAR | Status: AC
  Administered 2016-05-02: 100 mg via INTRAVENOUS
  Filled 2016-05-02: qty 2

## 2016-05-02 MED ORDER — ACETAMINOPHEN 500 MG PO TABS
1000.0000 mg | ORAL_TABLET | Freq: Once | ORAL | Status: AC
Start: 2016-05-02 — End: 2016-05-02
  Administered 2016-05-02: 1000 mg via ORAL
  Filled 2016-05-02: qty 2

## 2016-05-02 MED ORDER — SOTALOL HCL 80 MG PO TABS
160.0000 mg | ORAL_TABLET | Freq: Two times a day (BID) | ORAL | Status: DC
  Administered 2016-05-03 – 2016-05-06 (×6): 160 mg via ORAL
  Filled 2016-05-02 (×11): qty 2

## 2016-05-02 MED ORDER — MUPIROCIN 2 % EX OINT
1.0000 "application " | TOPICAL_OINTMENT | Freq: Two times a day (BID) | CUTANEOUS | Status: DC
  Administered 2016-05-03 – 2016-05-04 (×2): 1 via NASAL
  Filled 2016-05-02 (×4): qty 22

## 2016-05-02 MED ORDER — POTASSIUM CHLORIDE CRYS ER 20 MEQ PO TBCR
40.0000 meq | EXTENDED_RELEASE_TABLET | Freq: Four times a day (QID) | ORAL | Status: AC
  Administered 2016-05-02 (×2): 40 meq via ORAL
  Filled 2016-05-02 (×2): qty 2

## 2016-05-02 MED ORDER — HYDROCORTISONE NA SUCCINATE PF 100 MG IJ SOLR
100.0000 mg | Freq: Three times a day (TID) | INTRAMUSCULAR | Status: DC
  Administered 2016-05-02 – 2016-05-05 (×9): 100 mg via INTRAVENOUS
  Filled 2016-05-02 (×9): qty 2

## 2016-05-02 MED ORDER — VANCOMYCIN HCL IN DEXTROSE 1-5 GM/200ML-% IV SOLN
1000.0000 mg | Freq: Once | INTRAVENOUS | Status: AC
  Administered 2016-05-02: 1000 mg via INTRAVENOUS
  Filled 2016-05-02: qty 200

## 2016-05-02 MED ORDER — PIPERACILLIN-TAZOBACTAM 3.375 G IVPB 30 MIN
3.3750 g | Freq: Once | INTRAVENOUS | Status: AC
  Administered 2016-05-02: 3.375 g via INTRAVENOUS
  Filled 2016-05-02: qty 50

## 2016-05-02 MED ORDER — MAGNESIUM SULFATE 2 GM/50ML IV SOLN
2.0000 g | Freq: Once | INTRAVENOUS | Status: AC
  Administered 2016-05-02: 2 g via INTRAVENOUS
  Filled 2016-05-02: qty 50

## 2016-05-02 MED ORDER — NAPHAZOLINE-GLYCERIN 0.012-0.2 % OP SOLN
1.0000 [drp] | Freq: Three times a day (TID) | OPHTHALMIC | Status: DC
  Administered 2016-05-02 – 2016-05-05 (×7): 1 [drp] via OPHTHALMIC
  Filled 2016-05-02: qty 15

## 2016-05-02 MED ORDER — CHLORHEXIDINE GLUCONATE CLOTH 2 % EX PADS
6.0000 | MEDICATED_PAD | Freq: Every day | CUTANEOUS | Status: DC
  Administered 2016-05-03 – 2016-05-06 (×4): 6 via TOPICAL

## 2016-05-02 MED ORDER — SODIUM CHLORIDE 0.9 % IV BOLUS (SEPSIS)
1000.0000 mL | INTRAVENOUS | Status: DC | PRN
  Administered 2016-05-05: 1000 mL via INTRAVENOUS
  Filled 2016-05-02: qty 1000

## 2016-05-02 MED ORDER — SODIUM CHLORIDE 0.9 % IV BOLUS (SEPSIS)
500.0000 mL | Freq: Once | INTRAVENOUS | Status: AC
  Administered 2016-05-02: 500 mL via INTRAVENOUS

## 2016-05-02 MED ORDER — PERFLUTREN LIPID MICROSPHERE
1.0000 mL | INTRAVENOUS | Status: AC | PRN
  Administered 2016-05-02: 4 mL via INTRAVENOUS
  Filled 2016-05-02: qty 10

## 2016-05-02 MED ORDER — HYDROCODONE-ACETAMINOPHEN 10-325 MG PO TABS: 1.0000 | ORAL_TABLET | Freq: Four times a day (QID) | ORAL | Status: DC | PRN

## 2016-05-02 MED ORDER — PRO-STAT SUGAR FREE PO LIQD
30.0000 mL | Freq: Three times a day (TID) | ORAL | Status: DC
  Administered 2016-05-02 – 2016-05-06 (×4): 30 mL via ORAL
  Filled 2016-05-02 (×6): qty 30

## 2016-05-02 MED ORDER — HEPARIN SODIUM (PORCINE) 5000 UNIT/ML IJ SOLN
5000.0000 [IU] | Freq: Three times a day (TID) | INTRAMUSCULAR | Status: DC
  Administered 2016-05-02 – 2016-05-06 (×11): 5000 [IU] via SUBCUTANEOUS
  Filled 2016-05-02 (×10): qty 1

## 2016-05-02 MED ORDER — SODIUM CHLORIDE 0.9 % IV SOLN
1000.0000 mL | INTRAVENOUS | Status: DC
  Administered 2016-05-02 – 2016-05-03 (×5): 1000 mL via INTRAVENOUS

## 2016-05-02 MED ORDER — SODIUM CHLORIDE 0.9 % IV SOLN
INTRAVENOUS | Status: AC
  Administered 2016-05-02: 13:00:00 via INTRAVENOUS

## 2016-05-02 NOTE — ED Notes (Signed)
CRITICAL VALUE ALERT  Critical value received:  Lactic Acid 2.9  Date of notification:  05/02/16  Time of notification:  1142  Critical value read back:Yes.    Nurse who received alert:  Fransico HimMeagan Laresa Oshiro, RN  MD notified (1st page):  Dr. Thedore MinsSingh  Time of first page:  1142  MD notified (2nd page):  Time of second page:  Responding MD:  Dr. Thedore MinsSingh  Time MD responded:  [id number]

## 2016-05-02 NOTE — ED Notes (Signed)
Attempted to draw urine off of catheter with no success. Flushed catheter with 30cc sterile water, return of 28cc of light yellow fluid. MD notified of not being able to obtain urine specimen at this time.

## 2016-05-02 NOTE — Progress Notes (Signed)
CRITICAL VALUE ALERT  Critical value received:  MRSA +  Date of notification:  05/02/2016  Time of notification:  2230  Critical value read back: YES  Nurse who received alert:  Bess HarvestKandance Velmer Woelfel, RN

## 2016-05-02 NOTE — ED Notes (Signed)
Attempted to give report, RN unavailable.

## 2016-05-02 NOTE — Progress Notes (Addendum)
*  PRELIMINARY RESULTS* Echocardiogram 2D Echocardiogram has been performed with Definity.  Stacey DrainWhite, Masako Overall J 05/02/2016, 4:58 PM

## 2016-05-02 NOTE — ED Notes (Signed)
CRITICAL VALUE ALERT  Critical value received:  Troponin 0.03  Date of notification:  05/02/16  Time of notification:  0925  Critical value read back:Yes.    Nurse who received alert:  Fransico HimMeagan Landis Dowdy, RN  MD notified (1st page):  Dr. Clarene DukeMcManus  Time of first page:  0930  MD notified (2nd page):  Time of second page:  Responding MD:  Dr. Clarene DukeMcManus  Time MD responded:  0930

## 2016-05-02 NOTE — Consult Note (Addendum)
WOC Nurse wound consult note Reason for Consult:Stage 4 pressure injury, healing.  Had closed at one time, per son at bedside,  Is open at this time. Heels are offloaded with Prevalon boots.  Will order low air loss mattress replacement.  Wound type:pressure Pressure Ulcer POA: Yes Measurement: 2 cm x 2 cm x 0.2 cm  Wound ZOX:WRUEbed:pale pink nongranulating Drainage (amount, consistency, odor) Minimal serosanguinous.  No odor.  Periwound:scarring from resolved ulcer, has re-opened Dressing procedure/placement/frequency: Cleanse sacral wound with NS and pat gently dry.  Apply silicone border foam dressing.  Change every three days and PRN soilage.  Will not follow at this time.  Please re-consult if needed.  Maple HudsonKaren Freddi Forster RN BSN CWON Pager [phone] WOC Nurse ostomy consult note Stoma type/location: LLQ colostomy Stomal assessment/size: 1 1/8" pink well budded Peristomal assessment: intact Treatment options for stomal/peristomal skin:none  Output soft brown stool Ostomy pouching: 2pc. 2 1/4" pouch Education provided: None   Enrolled patient in [employer] DC program: No Will not follow at this time.  Please re-consult if needed.  Maple HudsonKaren Layah Skousen RN BSN CWON Pager [phone]

## 2016-05-02 NOTE — Progress Notes (Signed)
K. Schorr notified of continued hypotension after bolus.  BP 76/57 (64). 1,000 ml bolus ordered.  K. Schorr would like to keep MAP > 55.  Give bolus over 2-3 hours.

## 2016-05-02 NOTE — ED Provider Notes (Signed)
[facility]-EM[facility] DEPT Provider Note   CSN: [id number] Arrival date & time: 05/02/16  [id number]     History   Chief Complaint Chief Complaint  Patient presents with  . Hypotension  . Diarrhea  . Abdominal Pain    HPI Jon [name] is a 82 y.o. male.   Diarrhea   Associated symptoms include abdominal pain.  Abdominal Pain   Associated symptoms include diarrhea.    Pt was seen at 0800. Per EMS, pt and his son: Pt with c/o gradual onset and worsening of persistent abd pain and diarrhea for the past 2 to 3 days. States today his colostomy bag "leaked all over" due to the increasing diarrhea. [facility] days [facility], per family. EMS states pt found found lying in feces, with concerned regarding pt's living conditions. Pt's son is caregiver. Denies N/V, no fevers, no back pain, no CP/SOB, no cough, no black or blood in stools.      Past Medical History:  Diagnosis Date  . Abnormality of gait   . B12 deficiency   . Bilateral hydronephrosis   . Bladder calculi   . Bleeding ulcer   . Essential hypertension   . Essential tremor   . Macular degeneration of left eye   . Memory loss   . Multiple rib fractures   . Neuropathy (HCC)   . OSA (obstructive sleep apnea)    Does not use CPAP  . PAF (paroxysmal atrial fibrillation) (HCC)   . Tachycardia-bradycardia syndrome (HCC)    [employer] PPM - Dr. Royann Shiversroitoru  . Varicose veins    Bilateral    Patient Active Problem List   Diagnosis Date Noted  . Prolonged QT interval   . Paroxysmal atrial fibrillation (HCC)   . Tachycardia-bradycardia syndrome (HCC)   . Cardiac pacemaker in situ   . Hematemesis 05/15/2015  . GIB (gastrointestinal bleeding) 05/15/2015  . Malnutrition of moderate degree (HCC) 07/11/2014  . Perianal infection 07/06/2014  . Decubitus ulcer 07/06/2014  . Sacral ulcer (HCC) 07/06/2014  . Anemia of chronic disease 06/16/2014  . Sepsis (HCC) 06/15/2014  . UTI (lower urinary tract infection)  06/15/2014  . Acute kidney injury (HCC) 06/15/2014  . Bilateral hydronephrosis 06/15/2014  . Bladder calculi 06/15/2014  . Rhabdomyolysis 06/15/2014  . Multiple rib fractures 06/15/2014  . Hypotension 06/15/2014  . S/P placement of cardiac pacemaker 06/15/2014  . Dementia 06/15/2014  . AKI (acute kidney injury) (HCC) 06/15/2014  . Knee pain   . Dehydration 06/09/2014  . Hypertension 06/09/2014  . Abnormality of gait 05/03/2013  . Essential and other specified forms of tremor 05/03/2013  . Polyneuropathy in other diseases classified elsewhere (HCC) 05/03/2013  . Atrial fibrillation (HCC) 09/07/2012  . Long term current use of anticoagulant therapy 09/07/2012    Past Surgical History:  Procedure Laterality Date  . CATARACT EXTRACTION Bilateral 2013  . COLOSTOMY N/A 07/11/2014   Procedure: DIVERTING COLOSTOMY;  Surgeon: Dalia HeadingMark A Jenkins, MD;  Location: [facility] ORS;  Service: General;  Laterality: N/A;  wound class: clean contaminated  . ESOPHAGOGASTRODUODENOSCOPY N/A 05/16/2015   Procedure: ESOPHAGOGASTRODUODENOSCOPY (EGD);  Surgeon: West BaliSandi L Fields, MD;  Location: [facility] ENDO SUITE;  Service: Endoscopy;  Laterality: N/A;  . INCISION AND DRAINAGE OF WOUND N/A 07/11/2014   Procedure: DEBRIDEMENT OF SACRUM;  Surgeon: Dalia HeadingMark A Jenkins, MD;  Location: [facility] ORS;  Service: General;  Laterality: N/A;  . MENINGIOMA REMOVAL  1992   POST RT FRONTAL   . PACEMAKER INSERTION  2006  .  PERMANENT PACEMAKER GENERATOR CHANGE N/A 08/13/2012   Procedure: PERMANENT PACEMAKER GENERATOR CHANGE;  Surgeon: Thurmon Fair, MD;  Location: [facility] CATH [facility];  Service: Cardiovascular;  Laterality: N/A;      Home Medications    Prior to Admission medications   Medication Sig Start Date End Date Taking? Authorizing Provider  ALPRAZolam Prudy Feeler) 1 MG tablet Take 0.5 tablets (0.5 mg total) by mouth 2 (two) times daily as needed for anxiety. 06/18/14   Hollice Espy, MD  Amino Acids-Protein Hydrolys (FEEDING SUPPLEMENT, PRO-STAT  SUGAR FREE 64,) LIQD Take 30 mLs by mouth 3 (three) times daily with meals.    Historical Provider, MD  diphenhydrAMINE (BENADRYL) 25 MG tablet Take 25 mg by mouth every 6 (six) hours as needed for itching.    Historical Provider, MD  HYDROcodone-acetaminophen (NORCO) 10-325 MG tablet Take 1 tablet by mouth every 6 (six) hours as needed. 05/04/15   Historical Provider, MD  Multiple Vitamin (MULTIVITAMIN WITH MINERALS) TABS tablet Take 1 tablet by mouth daily.    Historical Provider, MD  Multiple Vitamins-Minerals (ICAPS AREDS FORMULA PO) Take 1 tablet by mouth daily.    Historical Provider, MD  pantoprazole (PROTONIX) 40 MG tablet Take 1 tablet (40 mg total) by mouth 2 (two) times daily before a meal. 05/18/15   Erick Blinks, MD  sotalol (BETAPACE) 160 MG tablet Take 1 tablet (160 mg total) by mouth 2 (two) times daily. Need appointment for future refills 06/06/15   Thurmon Fair, MD  Tetrahydrozoline HCl (EYE DROPS OP) Apply 2 drops to eye daily as needed (irritation).    Historical Provider, MD    Family History Family History  Problem Relation Age of Onset  . Aortic aneurysm Mother   . Parkinsonism Mother   . Heart disease Father   . Esophageal cancer Sister   . Colon cancer Neg Hx     Social History Social History  Substance Use Topics  . Smoking status: Former Smoker    Packs/day: 0.75    Years: 25.00    Types: Cigarettes  . Smokeless tobacco: Never Used     Comment: QUIT 35 YRS AGO  . Alcohol use No     Allergies   Iohexol; Shellfish allergy; Dilantin [phenytoin sodium extended]; and Tegretol [carbamazepine]   Review of Systems Review of Systems  Gastrointestinal: Positive for abdominal pain and diarrhea.   ROS: Statement: All systems negative except as marked or noted in the HPI; Constitutional: Negative for fever and chills. ; ; Eyes: Negative for eye pain, redness and discharge. ; ; ENMT: Negative for ear pain, hoarseness, nasal congestion, sinus pressure and sore  throat. ; ; Cardiovascular: Negative for chest pain, palpitations, diaphoresis, dyspnea and peripheral edema. ; ; Respiratory: Negative for cough, wheezing and stridor. ; ; Gastrointestinal: +diarrhea, abd pain. Negative for nausea, vomiting, blood in stool, hematemesis, jaundice and rectal bleeding. . ; ; Genitourinary: Negative for dysuria, flank pain and hematuria. ; ; Musculoskeletal: Negative for back pain and neck pain. Negative for swelling and trauma.; ; Skin: Negative for pruritus, rash, abrasions, blisters, bruising and skin lesion.; ; Neuro: Negative for headache, lightheadedness and neck stiffness. Negative for weakness, altered level of consciousness, altered mental status, extremity weakness, paresthesias, involuntary movement, seizure and syncope.       Physical Exam Updated Vital Signs BP (!) 77/58   Pulse 76   Temp 101.3 F (38.5 C) (Rectal)   Resp 17   SpO2 100%    Patient Vitals for the past 24 hrs:  BP Temp Temp src Pulse Resp SpO2 Height Weight  05/02/16 1050 (!) 79/54 - - 70 18 100 % - -  05/02/16 1045 (!) 67/52 - - 80 14 100 % - -  05/02/16 1030 (!) 72/47 - - 68 14 100 % - -  05/02/16 1015 (!) 78/49 - - 84 16 100 % - -  05/02/16 1000 (!) 77/55 - - 80 18 100 % - -  05/02/16 0945 (!) 70/51 - - 80 17 100 % - -  05/02/16 0930 (!) 86/53 - - - 20 - - -  05/02/16 0915 (!) 78/52 - - 88 16 100 % - -  05/02/16 0911 - - - - - - 5\' 11"  (1.803 m) 130 lb (59 kg)  05/02/16 0900 (!) 77/58 - - - 17 - - -  05/02/16 0845 91/58 - - - 22 - - -  05/02/16 0842 - 101.3 F (38.5 C) Rectal - - - - -  05/02/16 0830 (!) 82/63 - - 76 22 100 % - -  05/02/16 0800 (!) 65/50 - - 64 13 100 % - -  05/02/16 0757 (!) 57/46 - Oral 71 18 - - -    Physical Exam 0805: Physical examination:  Nursing notes reviewed; Vital signs and O2 SAT reviewed;  Constitutional: Thin, frail. In no acute distress; Head:  Normocephalic, atraumatic; Eyes: EOMI, PERRL, No scleral icterus; ENMT: Mouth and pharynx  normal, Mucous membranes dry and cracked.; Neck: Supple, Full range of motion, No lymphadenopathy; Cardiovascular: Regular rate and rhythm, No gallop; Respiratory: Breath sounds clear & equal bilaterally, No wheezes.  Speaking full sentences with ease, Normal respiratory effort/excursion; Chest: Nontender, Movement normal; Abdomen: Soft, +mild diffuse tenderness to palp. No rebound or guarding. +brown liquid stool in colostomy bag, no obvious blood. Nondistended, Normal bowel sounds; Genitourinary: No CVA tenderness. +foley in place, scrotum and penis superficially excoriated. No palp fluctuance.; Extremities: Pulses normal, No tenderness, No edema, No calf edema or asymmetry.; Neuro: AA&Ox3, Major CN grossly intact.  Speech clear. No gross focal motor deficits in extremities.; Skin: Color normal, Warm, Dry.   ED Treatments / Results  Labs (all labs ordered are listed, but only abnormal results are displayed)   EKG  EKG Interpretation  Date/Time:  Thursday May 02 2016 07:57:03 EST Ventricular Rate:  70 PR Interval:    QRS Duration: 92 QT Interval:  482 QTC Calculation: 521 R Axis:   79 Text Interpretation:  Atrial-paced rhythm Abnormal lateral Q waves Prolonged QT interval When compared with ECG of 05/18/2015 No significant change was found Confirmed by [facility]  MD, Nicholos JohnsKATHLEEN [phone]([id number]) on 05/02/2016 8:19:36 AM       Radiology   Procedures Procedures (including critical care time)  Medications Ordered in ED Medications  0.9 %  sodium chloride infusion (1,000 mLs Intravenous New Bag/Given 05/02/16 0902)  piperacillin-tazobactam (ZOSYN) IVPB 3.375 g (3.375 g Intravenous New Bag/Given 05/02/16 0902)  vancomycin (VANCOCIN) IVPB 1000 mg/200 mL premix (1,000 mg Intravenous New Bag/Given 05/02/16 0901)  sodium chloride 0.9 % bolus 1,000 mL (0 mLs Intravenous Stopped 05/02/16 0904)  sodium chloride 0.9 % bolus 1,000 mL (0 mLs Intravenous Stopped 05/02/16 0905)     Initial Impression /  Assessment and Plan / ED Course  I have reviewed the triage vital signs and the nursing notes.  Pertinent labs & imaging results that were available during my care of the patient were reviewed by me and considered in my medical decision making (see chart for details).  MDM Reviewed: previous chart, nursing note and vitals Reviewed previous: labs and ECG Interpretation: labs, ECG, x-ray and CT scan Total time providing critical care: 30-74 minutes. This excludes time spent performing separately reportable procedures and services. Consults: admitting MD   CRITICAL CARE Performed by: Laray Anger Total critical care time: 45 minutes Critical care time was exclusive of separately billable procedures and treating other patients. Critical care was necessary to treat or prevent imminent or life-threatening deterioration. Critical care was time spent personally by me on the following activities: development of treatment plan with patient and/or surrogate as well as nursing, discussions with consultants, evaluation of patient's response to treatment, examination of patient, obtaining history from patient or surrogate, ordering and performing treatments and interventions, ordering and review of laboratory studies, ordering and review of radiographic studies, pulse oximetry and re-evaluation of patient's condition.    Results for orders placed or performed during the hospital encounter of 05/02/16  C difficile quick scan w PCR reflex  Result Value Ref Range   C Diff antigen POSITIVE (A) NEGATIVE   C Diff toxin NEGATIVE NEGATIVE   C Diff interpretation Results are indeterminate. See PCR results.   Comprehensive metabolic panel  Result Value Ref Range   Sodium 133 (L) 135 - 145 mmol/L   Potassium 3.4 (L) 3.5 - 5.1 mmol/L   Chloride 107 101 - 111 mmol/L   CO2 18 (L) 22 - 32 mmol/L   Glucose, Bld 74 65 - 99 mg/dL   BUN 45 (H) 6 - 20 mg/dL   Creatinine, Ser 6.04 (H) 0.61 - 1.24 mg/dL    Calcium 7.7 (L) 8.9 - 10.3 mg/dL   Total Protein 4.4 (L) 6.5 - 8.1 g/dL   Albumin 2.4 (L) 3.5 - 5.0 g/dL   AST 26 15 - 41 U/L   ALT 11 (L) 17 - 63 U/L   Alkaline Phosphatase 42 38 - 126 U/L   Total Bilirubin 1.0 0.3 - 1.2 mg/dL   GFR calc non Af Amer 30 (L) >60 mL/min   GFR calc Af Amer 35 (L) >60 mL/min   Anion gap 8 5 - 15  Lipase, blood  Result Value Ref Range   Lipase 19 11 - 51 U/L  Troponin I  Result Value Ref Range   Troponin I 0.03 (HH) <0.03 ng/mL  Lactic acid, plasma  Result Value Ref Range   Lactic Acid, Venous 3.8 (HH) 0.5 - 1.9 mmol/L  CBC with Differential  Result Value Ref Range   WBC 5.3 4.0 - 10.5 K/uL   RBC 3.54 (L) 4.22 - 5.81 MIL/uL   Hemoglobin 10.7 (L) 13.0 - 17.0 g/dL   HCT 54.0 (L) 98.1 - 19.1 %   MCV 91.8 78.0 - 100.0 fL   MCH 30.2 26.0 - 34.0 pg   MCHC 32.9 30.0 - 36.0 g/dL   RDW 47.8 29.5 - 62.1 %   Platelets 65 (L) 150 - 400 K/uL   Neutrophils Relative % 90 %   Lymphocytes Relative 7 %   Monocytes Relative 3 %   Eosinophils Relative 0 %   Basophils Relative 0 %   Neutro Abs 4.7 1.7 - 7.7 K/uL   Lymphs Abs 0.4 (L) 0.7 - 4.0 K/uL   Monocytes Absolute 0.2 0.1 - 1.0 K/uL   Eosinophils Absolute 0.0 0.0 - 0.7 K/uL   Basophils Absolute 0.0 0.0 - 0.1 K/uL   WBC Morphology INCREASED BANDS (>20% BANDS)   Urinalysis, Routine w reflex microscopic  Result Value Ref Range  Color, Urine YELLOW YELLOW   APPearance CLOUDY (A) CLEAR   Specific Gravity, Urine <1.005 (L) 1.005 - 1.030   pH 7.5 5.0 - 8.0   Glucose, UA NEGATIVE NEGATIVE mg/dL   Hgb urine dipstick LARGE (A) NEGATIVE   Bilirubin Urine NEGATIVE NEGATIVE   Ketones, ur NEGATIVE NEGATIVE mg/dL   Protein, ur 30 (A) NEGATIVE mg/dL   Nitrite NEGATIVE NEGATIVE   Leukocytes, UA LARGE (A) NEGATIVE  Magnesium  Result Value Ref Range   Magnesium 1.4 (L) 1.7 - 2.4 mg/dL  Urine microscopic-add on  Result Value Ref Range   Squamous Epithelial / LPF NONE SEEN NONE SEEN   WBC, UA TOO NUMEROUS TO COUNT  0 - 5 WBC/hpf   RBC / HPF 0-5 0 - 5 RBC/hpf   Bacteria, UA MANY (A) NONE SEEN  POC occult blood, ED  Result Value Ref Range   Fecal Occult Bld NEGATIVE NEGATIVE    Ct Abdomen Pelvis Wo Contrast Result Date: 05/02/2016 CLINICAL DATA:  Generalized abdominal pain and diarrhea for several days. EXAM: CT ABDOMEN AND PELVIS WITHOUT CONTRAST TECHNIQUE: Multidetector CT imaging of the abdomen and pelvis was performed following the standard protocol without IV contrast. COMPARISON:  06/15/2014 and chest CT on 05/06/2006 FINDINGS: Lower chest: No acute findings. Hepatobiliary: No masses visualized on this unenhanced exam. Multiple calcified gallstones are again seen, without evidence of acute cholecystitis or biliary ductal dilatation. Pancreas:  Phone Spleen:  Within normal limits in size. Adrenals/Urinary tract: Multiple simple and hemorrhagic renal cysts are again seen bilaterally which are stable. Largest cyst arising from the lateral lower pole the left kidney extends inferiorly into the right lower quadrant measures 15 cm. These remain stable. Numerous small 5 mm calculi are seen within the right kidney and renal collecting system including the renal pelvis. There is no evidence hydronephrosis. No evidence of ureteral calculi or dilatation. Foley catheter is seen in place with decompressed urinary bladder. Diffuse bladder wall thickening is consistent with chronic bladder outlet obstruction. Multiple calculi are seen within the urinary bladder which have increased in size and number since previous study. Largest calculus measures 2.1 cm. Stomach/Bowel: No evidence of obstruction, inflammatory process, or abnormal fluid collections. Left lower quadrant colostomy noted. A right upper quadrant soft tissue mass is seen which abuts the proximal duodenum. This mass is homogeneous and shows mild peripheral calcification. This measures 2.8 x 3.2 cm on image 26/2 and shows minimal increase in size compared to previous  chest CT dating back to 05/06/2006 when it measured 2.4 x 2.7 cm. Vascular/Lymphatic: No pathologically enlarged lymph nodes identified. No evidence of abdominal aortic aneurysm. Aortic atherosclerosis. Reproductive:  Mildly enlarged prostate gland. Other:  None. Musculoskeletal: No suspicious bone lesions identified. Advanced lumbar degenerative spondylosis and bilateral hip osteoarthritis noted. IMPRESSION: No acute findings within the abdomen or pelvis. Increased number and size of bladder calculi measuring up to 2.1 cm. Diffuse bladder wall thickening, consistent with chronic bladder outlet obstruction. Urinary bladder is decompressed by Foley catheter. Stable mildly enlarged prostate. Multiple tiny nonobstructive right renal calculi. No evidence of ureteral calculi or hydronephrosis. Stable bilateral renal cysts, largest in left kidney measuring 15 cm. Cholelithiasis.  No radiographic evidence of cholecystitis. Minimal increase in size of 3.2 cm right upper quadrant mass abutting the proximal duodenum compared to previous studies dating back to 2007. This is likely benign in etiology, and differential diagnosis includes leiomyoma and GI stromal tumor. Consider endoscopic ultrasound or continued attention on follow-up imaging. Electronically Signed  By: Myles Rosenthal M.D.   On: 05/02/2016 10:43   Dg Chest Port 1 View Result Date: 05/02/2016 CLINICAL DATA:  Generalized weakness. EXAM: PORTABLE CHEST 1 VIEW COMPARISON:  05/15/2015 FINDINGS: Stable aortic tortuosity. The heart size is normal. Stable appearance of dual-chamber pacemaker There is no evidence of pulmonary edema, consolidation, pneumothorax, nodule or pleural fluid. The visualized skeletal structures are unremarkable. IMPRESSION: No active disease. Electronically Signed   By: Irish Lack M.D.   On: 05/02/2016 09:09    1100:  Pt hypotensive on arrival; appears clinically dehydrated. IVF boluses started. Fever noted; APAP given and code sepsis  called. BC and UC obtained and IV abx started. Copious liquid stool from colostomy; heme negative, GI pathogen panel and cdiff obtained. Scant urine output despite multiple IVF boluses and very slow increase in BP; stress dose IV steroids given for possibility of adrenal insufficiency. Pt continues to mentate per baseline per family at bedside, easily holding conversations with ED staff and family. ED RN and I spoke with pt and son regarding DNR/DNI and central line placement: pt refuses all, is OK with PICC prn. Dx and testing d/w pt and family.  Questions answered.  Verb understanding, agreeable to admit. T/C to [facility] Dr. Thedore Mins, case discussed, including:  HPI, pertinent PM/SHx, VS/PE, dx testing, ED course and treatment:  Agreeable to admit, requests to write temporary orders, obtain stepdown bed to team APAdmits.       Final Clinical Impressions(s) / ED Diagnoses   Final diagnoses:  None    New Prescriptions New Prescriptions   No medications on file      Samuel Jester, DO 05/04/16 2023

## 2016-05-02 NOTE — ED Triage Notes (Signed)
Ems reports called to house for "sick" this morning.  Pt found lying in feces.  Caretaker is son, but he was unable to provide much history.  Pt reports abd pain and diarrhea.  Colostomy bag in place as well as foley.  Both are leaking.

## 2016-05-02 NOTE — H&P (Signed)
TRH H&P   Patient Demographics:    Jon [name], is a 82 y.o. male  MRN: [id number]   DOB - 1932-04-22  Admit Date - 05/02/2016  Outpatient Primary MD for the patient is Milana Obey, MD  Outpatient Specialists:   General surgery: Dr. Lovell Sheehan   Cardiology: Simona Huh   Patient coming from: Home   Chief Complaint  Patient presents with  . Hypotension  . Diarrhea  . Abdominal Pain      HPI:    Jon [name]  is a 82 y.o. male, with a past medical history significant for PAF no anticoagulation due to fall risk, neuropathy, OSA, HTN, anxiety, s/p diverting colostomy & indwelling Foley due to Sacral Ulcer, who is bed bound of two years now presents from home via EMS with generalized weakness and diarrhea that started yesterday.   Patient was found weak and covered in feces by the EMS. He was covered in stool. He does have a mild generalized headache but denies any other symptoms. While in the ED, he was found to be septic with a low-grade fever, mild hypokalemia, severely low blood pressure, with an elevated lactic acid. He has been started on empirical antibiotics and admitted for further evaluation of sepsis due to UTI +/- Cdiff.    Review of systems:    In addition to the HPI above,  No Fever-chills, +ve Headache, No changes with Vision or hearing, No problems swallowing food or Liquids, No Chest pain, Cough or Shortness of Breath, No Abdominal pain, No Nausea or Vommitting, +ve diarrhea, No Blood in stool or Urine, No dysuria, No new skin rashes or bruises, No new joints pains-aches,  No new weakness, tingling, numbness in any extremity, No recent weight gain or loss, No polyuria,  polydypsia or polyphagia, No significant Mental Stressors.  A full 10 point Review of Systems was done, except as stated above, all other Review of Systems were negative.   With Past History of the following :    Past Medical History:  Diagnosis Date  . Abnormality of gait   . B12 deficiency   . Bilateral hydronephrosis   . Bladder calculi   . Bleeding ulcer   . Essential hypertension   . Essential tremor   . Macular degeneration of left eye   . Memory loss   .  Multiple rib fractures   . Neuropathy (HCC)   . OSA (obstructive sleep apnea)    Does not use CPAP  . PAF (paroxysmal atrial fibrillation) (HCC)   . Tachycardia-bradycardia syndrome (HCC)    [employer] PPM - Dr. Royann Shiversroitoru  . Varicose veins    Bilateral      Past Surgical History:  Procedure Laterality Date  . CATARACT EXTRACTION Bilateral 2013  . COLOSTOMY N/A 07/11/2014   Procedure: DIVERTING COLOSTOMY;  Surgeon: Dalia HeadingMark A Jenkins, MD;  Location: [facility];  Service: General;  Laterality: N/A;  wound class: clean contaminated  . ESOPHAGOGASTRODUODENOSCOPY N/A 05/16/2015   Procedure: ESOPHAGOGASTRODUODENOSCOPY (EGD);  Surgeon: West BaliSandi L Fields, MD;  Location: [facility];  Service: Endoscopy;  Laterality: N/A;  . INCISION AND DRAINAGE OF WOUND N/A 07/11/2014   Procedure: DEBRIDEMENT OF SACRUM;  Surgeon: Dalia HeadingMark A Jenkins, MD;  Location: [facility];  Service: General;  Laterality: N/A;  . MENINGIOMA REMOVAL  1992   POST RT FRONTAL   . PACEMAKER INSERTION  2006  . PERMANENT PACEMAKER GENERATOR CHANGE N/A 08/13/2012   Procedure: PERMANENT PACEMAKER GENERATOR CHANGE;  Surgeon: Thurmon FairMihai Croitoru, MD;  Location: [facility];  Service: Cardiovascular;  Laterality: N/A;      Social History:     Social History  Substance Use Topics  . Smoking status: Former Smoker    Packs/day: 0.75    Years: 25.00    Types: Cigarettes  . Smokeless tobacco: Never Used     Comment: QUIT 35 YRS AGO  . Alcohol use No       Family History :       Family History  Problem Relation Age of Onset  . Aortic aneurysm Mother   . Parkinsonism Mother   . Heart disease Father   . Esophageal cancer Sister   . Colon cancer Neg Hx      Home Medications:   Prior to Admission medications   Medication Sig Start Date End Date Taking? Authorizing Provider  ALPRAZolam Prudy Feeler(XANAX) 1 MG tablet Take 0.5 tablets (0.5 mg total) by mouth 2 (two) times daily as needed for anxiety. 06/18/14   Hollice EspySendil K Krishnan, MD  Amino Acids-Protein Hydrolys (FEEDING SUPPLEMENT, PRO-STAT SUGAR FREE 64,) LIQD Take 30 mLs by mouth 3 (three) times daily with meals.    Historical Provider, MD  diphenhydrAMINE (BENADRYL) 25 MG tablet Take 25 mg by mouth every 6 (six) hours as needed for itching.    Historical Provider, MD  HYDROcodone-acetaminophen (NORCO) 10-325 MG tablet Take 1 tablet by mouth every 6 (six) hours as needed. 05/04/15   Historical Provider, MD  Multiple Vitamin (MULTIVITAMIN WITH MINERALS) TABS tablet Take 1 tablet by mouth daily.    Historical Provider, MD  Multiple Vitamins-Minerals (ICAPS AREDS FORMULA PO) Take 1 tablet by mouth daily.    Historical Provider, MD  pantoprazole (PROTONIX) 40 MG tablet Take 1 tablet (40 mg total) by mouth 2 (two) times daily before a meal. 05/18/15   Erick BlinksJehanzeb Memon, MD  sotalol (BETAPACE) 160 MG tablet Take 1 tablet (160 mg total) by mouth 2 (two) times daily. Need appointment for future refills 06/06/15   Thurmon FairMihai Croitoru, MD  Tetrahydrozoline HCl (EYE DROPS OP) Apply 2 drops to eye daily as needed (irritation).    Historical Provider, MD     Allergies:     Allergies  Allergen Reactions  . Iohexol      Desc: PT STATES THROAT/FACE SWELLING W/ IVP DYE IN 1990. PT HAS NOT HAD ANY EXAMS DONE  W/DYE SINCE AND HAS NEVER BEEN PRE MEDICATED. PER PT, Onset Date: [id number]   . Shellfish Allergy   . Dilantin [Phenytoin Sodium Extended] Rash  . Tegretol [Carbamazepine] Rash     Physical Exam:   Vitals  Blood pressure (!) 68/53, pulse  75, temperature 101.3 F (38.5 C), temperature source Rectal, resp. rate 13, height 5\' 11"  (1.803 m), weight 59 kg (130 lb), SpO2 100 %.   1. General severaly cachetic 82 year old male lying in bed who looks weak and frail but in NAD.    2. Normal affect and insight, Not Suicidal or Homicidal, Awake Alert, Oriented X 3.  3. No F.N deficits, ALL C.Nerves Intact, Strength 4/5 all 4 extremities, Sensation intact all 4 extremities, Plantars down going. Wearing CAM boots in both feet.  4. Ears and Eyes appear Normal, Conjunctivae clear, PERRLA. Dry Oral Mucosa.  5. Supple Neck, No JVD, No cervical lymphadenopathy appriciated, No Carotid Bruits.  6. Symmetrical Chest wall movement, Good air movement bilaterally, CTAB.  7. RRR, No Gallops, Rubs or Murmurs, No Parasternal Heave.  8. Positive Bowel Sounds, Abdomen Soft, No tenderness, No organomegaly appriciated,No rebound -guarding or rigidity. Colostomy with liquid stool, indwelling foley.  9.  No Cyanosis, Normal Skin Turgor, No Skin Rash or Bruise.  10. Good muscle tone,  joints appear normal , no effusions, Normal ROM.  11. No Palpable Lymph Nodes in Neck or Axillae    Data Review:    CBC  Recent Labs Lab 05/02/16 0842  WBC 5.3  HGB 10.7*  HCT 32.5*  PLT 65*  MCV 91.8  MCH 30.2  MCHC 32.9  RDW 15.3  LYMPHSABS 0.4*  MONOABS 0.2  EOSABS 0.0  BASOSABS 0.0   ------------------------------------------------------------------------------------------------------------------  Chemistries   Recent Labs Lab 05/02/16 0842  NA 133*  K 3.4*  CL 107  CO2 18*  GLUCOSE 74  BUN 45*  CREATININE 1.95*  CALCIUM 7.7*  MG 1.4*  AST 26  ALT 11*  ALKPHOS 42  BILITOT 1.0   ------------------------------------------------------------------------------------------------------------------ estimated creatinine clearance is 24.4 mL/min (by C-G formula based on SCr of 1.95 mg/dL  (H)). ------------------------------------------------------------------------------------------------------------------ No results for input(s): TSH, T4TOTAL, T3FREE, THYROIDAB in the last 72 hours.  Invalid input(s): FREET3  Coagulation profile No results for input(s): INR, PROTIME in the last 168 hours. ------------------------------------------------------------------------------------------------------------------- No results for input(s): DDIMER in the last 72 hours. -------------------------------------------------------------------------------------------------------------------  Cardiac Enzymes  Recent Labs Lab 05/02/16 0842  TROPONINI 0.03*   ------------------------------------------------------------------------------------------------------------------ No results found for: BNP   ---------------------------------------------------------------------------------------------------------------  Urinalysis    Component Value Date/Time   COLORURINE YELLOW 05/02/2016 0815   APPEARANCEUR CLOUDY (A) 05/02/2016 0815   LABSPEC <1.005 (L) 05/02/2016 0815   PHURINE 7.5 05/02/2016 0815   GLUCOSEU NEGATIVE 05/02/2016 0815   HGBUR LARGE (A) 05/02/2016 0815   BILIRUBINUR NEGATIVE 05/02/2016 0815   KETONESUR NEGATIVE 05/02/2016 0815   PROTEINUR 30 (A) 05/02/2016 0815   UROBILINOGEN 0.2 03/14/2015 1515   NITRITE NEGATIVE 05/02/2016 0815   LEUKOCYTESUR LARGE (A) 05/02/2016 0815    ----------------------------------------------------------------------------------------------------------------   Imaging Results:    Ct Abdomen Pelvis Wo Contrast  Result Date: 05/02/2016 CLINICAL DATA:  Generalized abdominal pain and diarrhea for several days. EXAM: CT ABDOMEN AND PELVIS WITHOUT CONTRAST TECHNIQUE: Multidetector CT imaging of the abdomen and pelvis was performed following the standard protocol without IV contrast. COMPARISON:  06/15/2014 and chest CT on 05/06/2006 FINDINGS:  Lower chest: No acute findings. Hepatobiliary: No masses visualized on this unenhanced exam. Multiple calcified gallstones  are again seen, without evidence of acute cholecystitis or biliary ductal dilatation. Pancreas:  Phone Spleen:  Within normal limits in size. Adrenals/Urinary tract: Multiple simple and hemorrhagic renal cysts are again seen bilaterally which are stable. Largest cyst arising from the lateral lower pole the left kidney extends inferiorly into the right lower quadrant measures 15 cm. These remain stable. Numerous small 5 mm calculi are seen within the right kidney and renal collecting system including the renal pelvis. There is no evidence hydronephrosis. No evidence of ureteral calculi or dilatation. Foley catheter is seen in place with decompressed urinary bladder. Diffuse bladder wall thickening is consistent with chronic bladder outlet obstruction. Multiple calculi are seen within the urinary bladder which have increased in size and number since previous study. Largest calculus measures 2.1 cm. Stomach/Bowel: No evidence of obstruction, inflammatory process, or abnormal fluid collections. Left lower quadrant colostomy noted. A right upper quadrant soft tissue mass is seen which abuts the proximal duodenum. This mass is homogeneous and shows mild peripheral calcification. This measures 2.8 x 3.2 cm on image 26/2 and shows minimal increase in size compared to previous chest CT dating back to 05/06/2006 when it measured 2.4 x 2.7 cm. Vascular/Lymphatic: No pathologically enlarged lymph nodes identified. No evidence of abdominal aortic aneurysm. Aortic atherosclerosis. Reproductive:  Mildly enlarged prostate gland. Other:  None. Musculoskeletal: No suspicious bone lesions identified. Advanced lumbar degenerative spondylosis and bilateral hip osteoarthritis noted. IMPRESSION: No acute findings within the abdomen or pelvis. Increased number and size of bladder calculi measuring up to 2.1 cm. Diffuse  bladder wall thickening, consistent with chronic bladder outlet obstruction. Urinary bladder is decompressed by Foley catheter. Stable mildly enlarged prostate. Multiple tiny nonobstructive right renal calculi. No evidence of ureteral calculi or hydronephrosis. Stable bilateral renal cysts, largest in left kidney measuring 15 cm. Cholelithiasis.  No radiographic evidence of cholecystitis. Minimal increase in size of 3.2 cm right upper quadrant mass abutting the proximal duodenum compared to previous studies dating back to 2007. This is likely benign in etiology, and differential diagnosis includes leiomyoma and GI stromal tumor. Consider endoscopic ultrasound or continued attention on follow-up imaging. Electronically Signed   By: Myles RosenthalJohn  Stahl M.D.   On: 05/02/2016 10:43   Dg Chest Port 1 View  Result Date: 05/02/2016 CLINICAL DATA:  Generalized weakness. EXAM: PORTABLE CHEST 1 VIEW COMPARISON:  05/15/2015 FINDINGS: Stable aortic tortuosity. The heart size is normal. Stable appearance of dual-chamber pacemaker There is no evidence of pulmonary edema, consolidation, pneumothorax, nodule or pleural fluid. The visualized skeletal structures are unremarkable. IMPRESSION: No active disease. Electronically Signed   By: Irish LackGlenn  Yamagata M.D.   On: 05/02/2016 09:09    My personal review of EKG: Atrial-paced rhythm    Assessment & Plan:      1. Sepsis secondary to UTI. On admission, patient was noted to have a low grade fever with, severe hypotension, mild hypokalemia, and an elevated lactic acid. Urinalysis was indicative of infection. Will follow cultures and continue empiric antibiotic Rocephin based on Previous urine culture and sensitivity data. Note Foley catheter was changed 3 days ago by home health staff.  2. Diarrhea. He does have diarrhea in his colostomy bag,  his C. Diff panel was inconclusive, he has exposure to multiple antibiotics for recurrent UTIs and clinically is at risk for C. difficile,  since he is extremely sick for now we will put him on oral vancomycin and continue to monitor .   3. Hypomagnesemia/Hypokalemia. Replace both potassium and magnesium.  4. Paroxysmal atrial fibrillation - history of tachybradycardia syndrome status post pacemaker placement. [place] VASC2 score of at least 3. Patient is not anticoagulated on any medications due to his fall risk.  Will defer to his primary care physician. EKG shows atrial-paced rhythm. Will hold beta blocker until pressures have stabilized.   5. Essential HTN. Pressures are severely low, will hold beta-blocker until pressures have stabilized. For now IV fluids per sepsis protocol, when necessary IV fluid bolus, stress dose IV steroids and midodrine. Will check a baseline echocardiogram as well to evaluate EF and wall motion.  6. Anxiety. Continue xanax.   7. OSA. Continue CPAP.   8. HX of sacral decubitus ulcer. He was recently discharged from [facility] nursing home per son this ulcer has completely healed, we will continue to monitor.  9. Non specific CT findings of bladder stone, bladder thickening, bowel mass which appears chronic and benign. We'll defer this to PCP for monitoring with outpatient age-appropriate workup and follow-up.   DVT Prophylaxis  SCDs  AM Labs Ordered, also please review Full Orders  Family Communication: Admission, patients condition and plan of care including tests being ordered have been discussed with the patient and son who indicate understanding and agree with the plan and Code Status.  Code Status DNR  Likely DC to  Home   Condition GUARDED   Consults called: None   Admission status: Inpatient   Time spent in minutes : 35 minutes    Susa Raring K M.D on 05/02/2016 at 11:28 AM  Between 7am to 7pm - Pager - [phone]. After 7pm go to [email or web address] - password [email or web address]  [facility] - Office  [phone]    By signing my name below, I, Cynda Acres, attest that this  documentation has been prepared under the direction and in the presence of Susa Raring , MD. Electronically signed: Cynda Acres, Scribe. 05/02/16

## 2016-05-02 NOTE — ED Notes (Signed)
Patient transported to CT 

## 2016-05-02 NOTE — ED Notes (Signed)
CRITICAL VALUE ALERT  Critical value received:  Lactic Acid 3.8  Date of notification:  05/02/16  Time of notification:  0920  Critical value read back:Yes.    Nurse who received alert:  Fransico HimMeagan Braidyn Peace, RN  MD notified (1st page):  Dr. Clarene DukeMcManus  Time of first page:  0920  MD notified (2nd page):  Time of second page:  Responding MD:  Dr. Clarene DukeMcManus  Time MD responded:  [phone]

## 2016-05-02 NOTE — Progress Notes (Signed)
Kirtland BouchardK. Schorr notified of ongoing hypotension.  Pt BP currently 76/55 (63).

## 2016-05-03 ENCOUNTER — Inpatient Hospital Stay (HOSPITAL_COMMUNITY): Payer: Medicare Other

## 2016-05-03 DIAGNOSIS — I480 Paroxysmal atrial fibrillation: Secondary | ICD-10-CM

## 2016-05-03 LAB — GASTROINTESTINAL PANEL BY PCR, STOOL (REPLACES STOOL CULTURE)

## 2016-05-03 LAB — CBC
HEMATOCRIT: 30.5 % — AB (ref 39.0–52.0)
HEMOGLOBIN: 9.9 g/dL — AB (ref 13.0–17.0)
MCH: 30.3 pg (ref 26.0–34.0)
MCHC: 32.5 g/dL (ref 30.0–36.0)
MCV: 93.3 fL (ref 78.0–100.0)
Platelets: 77 10*3/uL — ABNORMAL LOW (ref 150–400)
RBC: 3.27 MIL/uL — ABNORMAL LOW (ref 4.22–5.81)
RDW: 15.2 % (ref 11.5–15.5)
WBC: 11.6 10*3/uL — ABNORMAL HIGH (ref 4.0–10.5)

## 2016-05-03 LAB — BASIC METABOLIC PANEL
ANION GAP: 5 (ref 5–15)
BUN: 42 mg/dL — ABNORMAL HIGH (ref 6–20)
CHLORIDE: 115 mmol/L — AB (ref 101–111)
CO2: 17 mmol/L — ABNORMAL LOW (ref 22–32)
Calcium: 7.3 mg/dL — ABNORMAL LOW (ref 8.9–10.3)
Creatinine, Ser: 1.66 mg/dL — ABNORMAL HIGH (ref 0.61–1.24)
GFR calc Af Amer: 43 mL/min — ABNORMAL LOW (ref >60)
GFR, EST NON AFRICAN AMERICAN: 37 mL/min — AB (ref >60)
GLUCOSE: 121 mg/dL — AB (ref 65–99)
POTASSIUM: 4.9 mmol/L (ref 3.5–5.1)
Sodium: 137 mmol/L (ref 135–145)

## 2016-05-03 LAB — URINE CULTURE

## 2016-05-03 LAB — LACTIC ACID, PLASMA
LACTIC ACID, VENOUS: 2.2 mmol/L — AB (ref 0.5–1.9)
Lactic Acid, Venous: 1.9 mmol/L (ref 0.5–1.9)

## 2016-05-03 MED ORDER — CHLORHEXIDINE GLUCONATE CLOTH 2 % EX PADS
6.0000 | MEDICATED_PAD | Freq: Every day | CUTANEOUS | Status: DC
  Administered 2016-05-05 – 2016-05-06 (×2): 6 via TOPICAL

## 2016-05-03 MED ORDER — PROMETHAZINE HCL 25 MG/ML IJ SOLN
12.5000 mg | Freq: Three times a day (TID) | INTRAMUSCULAR | Status: DC | PRN
  Administered 2016-05-03: 12.5 mg via INTRAVENOUS
  Filled 2016-05-03: qty 1

## 2016-05-03 MED ORDER — SODIUM CHLORIDE 0.9 % IV BOLUS (SEPSIS)
500.0000 mL | Freq: Once | INTRAVENOUS | Status: AC
  Administered 2016-05-03: 500 mL via INTRAVENOUS

## 2016-05-03 MED ORDER — SODIUM CHLORIDE 0.9 % IV SOLN: 1000.0000 mL | INTRAVENOUS | Status: DC

## 2016-05-03 MED ORDER — ONDANSETRON HCL 4 MG/2ML IJ SOLN
4.0000 mg | Freq: Once | INTRAMUSCULAR | Status: AC
  Administered 2016-05-03: 4 mg via INTRAVENOUS
  Filled 2016-05-03: qty 2

## 2016-05-03 MED ORDER — ONDANSETRON HCL 4 MG/2ML IJ SOLN
4.0000 mg | Freq: Four times a day (QID) | INTRAMUSCULAR | Status: DC | PRN
  Administered 2016-05-03: 4 mg via INTRAVENOUS
  Filled 2016-05-03: qty 2

## 2016-05-03 MED ORDER — MUPIROCIN 2 % EX OINT
1.0000 "application " | TOPICAL_OINTMENT | Freq: Two times a day (BID) | CUTANEOUS | Status: DC
  Administered 2016-05-03 – 2016-05-05 (×5): 1 via NASAL
  Filled 2016-05-03 (×3): qty 22

## 2016-05-03 MED ORDER — MAGNESIUM SULFATE IN D5W 1-5 GM/100ML-% IV SOLN
1.0000 g | Freq: Once | INTRAVENOUS | Status: AC
  Administered 2016-05-03: 1 g via INTRAVENOUS
  Filled 2016-05-03: qty 100

## 2016-05-03 NOTE — Progress Notes (Signed)
CRITICAL VALUE ALERT  Critical value received:  Lactic Acid = 2.2  Date of notification:  05/03/2016  Time of notification:  0615  Critical value read back: Yes  Nurse who received alert:  Liborio NixonJanice, RN  MD notified (1st page):  Dr. Katrinka BlazingSmith  Time of first page:  0617  Responding MD:  Dr. Katrinka BlazingSmith  Time MD responded:  0630  Give 500ml bolus over 1 hour.

## 2016-05-03 NOTE — Progress Notes (Signed)
While working with Physical Therapy patient began to vomit again. Noted that no output in colostomy bag since last night, only gas present. MD notified. Orders and CT of abdomen completed, MD made aware about results of CT scan. Orders completed, NG tube placed.

## 2016-05-03 NOTE — Clinical Social Work Note (Signed)
Pt will return home at d/c. CSW signing off.  Derenda FennelKara Shermaine Brigham, LCSW [phone]

## 2016-05-03 NOTE — Progress Notes (Signed)
PT Cancellation Note  Patient Details Name: [name] Nale MRN: [id number] DOB: 1932/02/08   Cancelled Treatment:    Reason Eval/Treat Not Completed: Other (comment) (Unable to complete full mobility assessment, however, son was present, and able to assist with PLOF.    Pt has been bedbound at home for the past 2 years - since Sept., 2015 with sacral decubitus.  His son assists him with all functional mobility in the bed, dressing and bathing.  Son states that they use EMS for transport to and from doctor appointments.  Therefore, there does not seem to be much room for functional improvement with PT.    While PT was inquiring about pt's PLOF, pt began vomiting, and BP was found to be elevated at 155/117, therefore, mobility portion of assessment unable to be completed.  Pt has a hospital bed and air mattress already at home.  Son provides 24/7 supervision/assistnance.  At this point I would not see any reason why he could not return home and continue care at home possibly with the addition of some HHPT.)     Beth Maisley Hainsworth, PT, DPT X: [phone]

## 2016-05-03 NOTE — Care Management Important Message (Signed)
Important Message  Patient Details  Name: [name] Gehrig MRN: [id number] Date of Birth: 11-Jan-1932   Medicare Important Message Given:  Yes    Malcolm Metrohildress, Shravya Wickwire Demske, RN 05/03/2016, 1:40 PM

## 2016-05-03 NOTE — Progress Notes (Addendum)
PROGRESS NOTE                                                                                                                                                                                                             Patient Demographics:    Jon [name], is a 82 y.o. male, DOB - [birth year], BJY:[id number]  Admit date - 05/02/2016   Admitting Physician Leroy Sea, MD  Outpatient Primary MD for the patient is Milana Obey, MD  LOS - 1  Chief Complaint  Patient presents with  . Hypotension  . Diarrhea  . Abdominal Pain       Brief Narrative  Jon [name]  is a 82 y.o. male, with a past medical history significant for PAF no anticoagulation due to fall risk, neuropathy, OSA, HTN, anxiety, s/p diverting colostomy & indwelling Foley due to Sacral Ulcer, who is bed bound of two years now presents from home via EMS with generalized weakness and diarrhea that started yesterday.   Patient was found weak and covered in feces by the EMS. He was covered in stool. He does have a mild generalized headache but denies any other symptoms. While in the ED, he was found to be septic with a low-grade fever, mild hypokalemia, severely low blood pressure, with an elevated lactic acid. He has been started on empirical antibiotics and admitted for further evaluation of sepsis due to UTI +/- Cdiff.    Subjective:    Jon [name] today has, No headache, No chest pain, No abdominal pain - No Nausea, No new weakness tingling or numbness, No Cough - SOB.  He feels much better today.   Assessment  & Plan :   1. Sepsis secondary to UTI from indwelling Foley. On admission, patient was noted to have a low grade fevers with severe hypotension, mild hypokalemia, and an elevated lactic acid. Urinalysis was indicative of infection. Will follow cultures and continue empiric antibiotic Rocephin based on Previous urine culture and  sensitivity data. Note Foley catheter was changed 3 days ago by home health staff. Although he remains hypotensive he appears nontoxic and second admission he feels about 60-70% better. Continue IV fluids and bolus as needed. Continue hydrocortisone IV.  2. Diarrhea. He does have diarrhea in his colostomy bag,  his C. Diff panel was inconclusive, he has exposure to multiple  antibiotics for recurrent UTIs and clinically is at risk for C. difficile, since he is extremely sick for now we will put him on oral vancomycin and continue to monitor .   3. Hypomagnesemia/Hypokalemia. Replaced both potassium and magnesium.   4. Paroxysmal atrial fibrillation - history of tachybradycardia syndrome status post pacemaker placement. [place] VASC2 score of at least 3. Patient is not anticoagulated on any medications due to his fall risk.  Will defer to his primary care physician. EKG shows atrial-paced rhythm. Will hold beta blocker until pressures have stabilized.   5. Essential HTN. Pressures are severely low, will hold beta-blocker until pressures have stabilized. For now IV fluids per sepsis protocol, when necessary IV fluid bolus, stress dose IV steroids and midodrine. Will check a baseline echocardiogram as well to evaluate EF and wall motion.  6. Anxiety. Continue xanax.   7. OSA. Continue CPAP.   8. HX of sacral decubitus ulcer. He was recently discharged from [facility] nursing home per son this ulcer has completely healed, we will continue to monitor.  9. Non specific CT findings of bladder stone, bladder thickening, bowel mass which appears chronic and benign. We'll defer this to PCP for monitoring with outpatient age-appropriate workup and follow-up.  10. ARF due to severe dehydration and sepsis. Improving with hydration. Continue to monitor.    Family Communication  :  Son  Code Status :  DNR  Diet : Diet regular Room service appropriate? Yes; Fluid consistency: Thin    Disposition Plan  :   Step down  Consults  :  None  Procedures  :  TTE  DVT Prophylaxis  :   Heparin   Lab Results  Component Value Date   PLT 77 (L) 05/03/2016    Inpatient Medications  Scheduled Meds: . cefTRIAXone (ROCEPHIN)  IV  1 g Intravenous Q24H  . Chlorhexidine Gluconate Cloth  6 each Topical Q0600  . feeding supplement (PRO-STAT SUGAR FREE 64)  30 mL Oral TID WC  . heparin  5,000 Units Subcutaneous Q8H  . hydrocortisone sod succinate (SOLU-CORTEF) inj  100 mg Intravenous Q8H  . mupirocin ointment  1 application Nasal BID  . naphazoline-glycerin  1 drop Both Eyes TID  . sodium chloride flush  3 mL Intravenous Q12H  . sotalol  160 mg Oral BID  . vancomycin  250 mg Oral Q6H   Continuous Infusions: . sodium chloride 1,000 mL (05/03/16 0926)   PRN Meds:.acetaminophen, ALPRAZolam, HYDROcodone-acetaminophen, sodium chloride  Antibiotics  :    Anti-infectives    Start     Dose/Rate Route Frequency Ordered Stop   05/02/16 1300  vancomycin (VANCOCIN) 50 mg/mL oral solution 250 mg     250 mg Oral Every 6 hours 05/02/16 1248     05/02/16 1200  cefTRIAXone (ROCEPHIN) 1 g in dextrose 5 % 50 mL IVPB     1 g 100 mL/hr over 30 Minutes Intravenous Every 24 hours 05/02/16 1104 05/08/16 1159   05/02/16 0900  piperacillin-tazobactam (ZOSYN) IVPB 3.375 g     3.375 g 100 mL/hr over 30 Minutes Intravenous  Once 05/02/16 0854 05/02/16 0932   05/02/16 0900  vancomycin (VANCOCIN) IVPB 1000 mg/200 mL premix     1,000 mg 200 mL/hr over 60 Minutes Intravenous  Once 05/02/16 0854 05/02/16 1001         Objective:   Vitals:   05/03/16 0500 05/03/16 0530 05/03/16 0600 05/03/16 0831  BP: (!) 69/51 (!) 65/51 (!) 65/54   Pulse: 60 60 60  Resp: 17 13 12    Temp:    (!) 96.7 F (35.9 C)  TempSrc:    Oral  SpO2: 100% 100% 100%   Weight: 62.4 kg (137 lb 9.1 oz)     Height:        Wt Readings from Last 3 Encounters:  05/03/16 62.4 kg (137 lb 9.1 oz)  05/15/15 63.8 kg (140 lb 10.5 oz)  05/09/15 87.5 kg  (193 lb)     Intake/Output Summary (Last 24 hours) at 05/03/16 0958 Last data filed at 05/03/16 0915  Gross per 24 hour  Intake          4616.25 ml  Output              350 ml  Net          4266.25 ml     Physical Exam  Awake Alert, Oriented X 3, No new F.N deficits, Normal affect [place].AT,PERRAL Supple Neck,No JVD, No cervical lymphadenopathy appriciated.  Symmetrical Chest wall movement, Good air movement bilaterally, CTAB RRR,No Gallops,Rubs or new Murmurs, No Parasternal Heave +ve B.Sounds, Abd Soft, No tenderness, No organomegaly appriciated, No rebound - guarding or rigidity. Colostomy in place with liquid stool, chronic indwelling Foley No Cyanosis, Clubbing or edema, No new Rash or bruise, bilateral cam boots    Data Review:    CBC  Recent Labs Lab 05/02/16 0842 05/03/16 0417  WBC 5.3 11.6*  HGB 10.7* 9.9*  HCT 32.5* 30.5*  PLT 65* 77*  MCV 91.8 93.3  MCH 30.2 30.3  MCHC 32.9 32.5  RDW 15.3 15.2  LYMPHSABS 0.4*  --   MONOABS 0.2  --   EOSABS 0.0  --   BASOSABS 0.0  --     Chemistries   Recent Labs Lab 05/02/16 0842 05/03/16 0417  NA 133* 137  K 3.4* 4.9  CL 107 115*  CO2 18* 17*  GLUCOSE 74 121*  BUN 45* 42*  CREATININE 1.95* 1.66*  CALCIUM 7.7* 7.3*  MG 1.4*  --   AST 26  --   ALT 11*  --   ALKPHOS 42  --   BILITOT 1.0  --    ------------------------------------------------------------------------------------------------------------------ No results for input(s): CHOL, HDL, LDLCALC, TRIG, CHOLHDL, LDLDIRECT in the last 72 hours.  No results found for: HGBA1C ------------------------------------------------------------------------------------------------------------------ No results for input(s): TSH, T4TOTAL, T3FREE, THYROIDAB in the last 72 hours.  Invalid input(s): FREET3 ------------------------------------------------------------------------------------------------------------------ No results for input(s): VITAMINB12, FOLATE,  FERRITIN, TIBC, IRON, RETICCTPCT in the last 72 hours.  Coagulation profile  Recent Labs Lab 05/02/16 1600  INR 2.01    No results for input(s): DDIMER in the last 72 hours.  Cardiac Enzymes  Recent Labs Lab 05/02/16 0842  TROPONINI 0.03*   ------------------------------------------------------------------------------------------------------------------ No results found for: BNP  Micro Results Recent Results (from the past 240 hour(s))  C difficile quick scan w PCR reflex     Status: Abnormal   Collection Time: 05/02/16  8:33 AM  Result Value Ref Range Status   C Diff antigen POSITIVE (A) NEGATIVE Final   C Diff toxin NEGATIVE NEGATIVE Final   C Diff interpretation Results are indeterminate. See PCR results.  Final  Clostridium Difficile by PCR     Status: Abnormal   Collection Time: 05/02/16  8:33 AM  Result Value Ref Range Status   Toxigenic C Difficile by pcr POSITIVE (A) NEGATIVE Final    Comment: Positive for toxigenic C. difficile with little to no toxin production. Only treat if clinical presentation suggests  symptomatic illness. [facility]   Blood Culture (routine x 2)     Status: None (Preliminary result)   Collection Time: 05/02/16  9:24 AM  Result Value Ref Range Status   Specimen Description BLOOD RIGHT HAND  Final   Special Requests BOTTLES DRAWN AEROBIC ONLY 5CC  Final   Culture PENDING  Incomplete   Report Status PENDING  Incomplete  Blood Culture (routine x 2)     Status: None (Preliminary result)   Collection Time: 05/02/16  9:30 AM  Result Value Ref Range Status   Specimen Description BLOOD RIGHT ANTECUBITAL  Final   Special Requests BOTTLES DRAWN AEROBIC AND ANAEROBIC 8CC  Final   Culture PENDING  Incomplete   Report Status PENDING  Incomplete  MRSA PCR Screening     Status: Abnormal   Collection Time: 05/02/16 12:50 PM  Result Value Ref Range Status   MRSA by PCR POSITIVE (A) NEGATIVE Final    Comment:        The  GeneXpert MRSA Assay (FDA approved for NASAL specimens only), is one component of a comprehensive MRSA colonization surveillance program. It is not intended to diagnose MRSA infection nor to guide or monitor treatment for MRSA infections. RESULT CALLED TO, READ BACK BY AND VERIFIED WITH: ROSE,C ON 05/02/16 AT 2230 BY LOY,C     Radiology Reports Ct Abdomen Pelvis Wo Contrast  Result Date: 05/02/2016 CLINICAL DATA:  Generalized abdominal pain and diarrhea for several days. EXAM: CT ABDOMEN AND PELVIS WITHOUT CONTRAST TECHNIQUE: Multidetector CT imaging of the abdomen and pelvis was performed following the standard protocol without IV contrast. COMPARISON:  06/15/2014 and chest CT on 05/06/2006 FINDINGS: Lower chest: No acute findings. Hepatobiliary: No masses visualized on this unenhanced exam. Multiple calcified gallstones are again seen, without evidence of acute cholecystitis or biliary ductal dilatation. Pancreas:  Phone Spleen:  Within normal limits in size. Adrenals/Urinary tract: Multiple simple and hemorrhagic renal cysts are again seen bilaterally which are stable. Largest cyst arising from the lateral lower pole the left kidney extends inferiorly into the right lower quadrant measures 15 cm. These remain stable. Numerous small 5 mm calculi are seen within the right kidney and renal collecting system including the renal pelvis. There is no evidence hydronephrosis. No evidence of ureteral calculi or dilatation. Foley catheter is seen in place with decompressed urinary bladder. Diffuse bladder wall thickening is consistent with chronic bladder outlet obstruction. Multiple calculi are seen within the urinary bladder which have increased in size and number since previous study. Largest calculus measures 2.1 cm. Stomach/Bowel: No evidence of obstruction, inflammatory process, or abnormal fluid collections. Left lower quadrant colostomy noted. A right upper quadrant soft tissue mass is seen which  abuts the proximal duodenum. This mass is homogeneous and shows mild peripheral calcification. This measures 2.8 x 3.2 cm on image 26/2 and shows minimal increase in size compared to previous chest CT dating back to 05/06/2006 when it measured 2.4 x 2.7 cm. Vascular/Lymphatic: No pathologically enlarged lymph nodes identified. No evidence of abdominal aortic aneurysm. Aortic atherosclerosis. Reproductive:  Mildly enlarged prostate gland. Other:  None. Musculoskeletal: No suspicious bone lesions identified. Advanced lumbar degenerative spondylosis and bilateral hip osteoarthritis noted. IMPRESSION: No acute findings within the abdomen or pelvis. Increased number and size of bladder calculi measuring up to 2.1 cm. Diffuse bladder wall thickening, consistent with chronic bladder outlet obstruction. Urinary bladder is decompressed by Foley catheter. Stable mildly enlarged prostate. Multiple tiny nonobstructive right renal calculi. No evidence  of ureteral calculi or hydronephrosis. Stable bilateral renal cysts, largest in left kidney measuring 15 cm. Cholelithiasis.  No radiographic evidence of cholecystitis. Minimal increase in size of 3.2 cm right upper quadrant mass abutting the proximal duodenum compared to previous studies dating back to 2007. This is likely benign in etiology, and differential diagnosis includes leiomyoma and GI stromal tumor. Consider endoscopic ultrasound or continued attention on follow-up imaging. Electronically Signed   By: Myles Rosenthal M.D.   On: 05/02/2016 10:43   Dg Chest Port 1 View  Result Date: 05/02/2016 CLINICAL DATA:  Generalized weakness. EXAM: PORTABLE CHEST 1 VIEW COMPARISON:  05/15/2015 FINDINGS: Stable aortic tortuosity. The heart size is normal. Stable appearance of dual-chamber pacemaker There is no evidence of pulmonary edema, consolidation, pneumothorax, nodule or pleural fluid. The visualized skeletal structures are unremarkable. IMPRESSION: No active disease.  Electronically Signed   By: Irish Lack M.D.   On: 05/02/2016 09:09    Time Spent in minutes  30   SINGH,PRASHANT K M.D on 05/03/2016 at 9:58 AM  Between 7am to 7pm - Pager - [phone]  After 7pm go to [email or web address] - password [facility]  [facility] -  Office  [phone]

## 2016-05-03 NOTE — Progress Notes (Signed)
Patient began to have nausea and some vomiting, pt and son stated that they believe the cause is from the pro-stat because it has previously given the pt N/V. MD notified, orders completed. Not resolved, notified MD of continuous N/V, orders put in and completed. N/V resolved.

## 2016-05-03 NOTE — Clinical Social Work Note (Signed)
  Clinical Social Work Assessment  Patient Details  Name: Jon [name] MRN: [id number] Date of Birth: 19-Sep-1931  Date of referral:  05/03/16               Reason for consult:  Discharge Planning                Permission sought to share information with:    Permission granted to share information::     Name::        Agency::     Relationship::     Contact Information:  Son, Tawanna Coolerodd was at bedside.  Housing/Transportation Living arrangements for the past 2 months:  Single Family Home Source of Information:  Patient, Adult Children Patient Interpreter Needed:  None Criminal Activity/Legal Involvement Pertinent to Current Situation/Hospitalization:  No - Comment as needed Significant Relationships:  Adult Children Lives with:  Adult Children Do you feel safe going back to the place where you live?  Yes Need for family participation in patient care:  Yes (Comment)  Care giving concerns:  Patient has HH/PT nursing care.    Social Worker assessment / plan: Patient's son Tawanna Coolerodd was at bedside. Patient and his son live together. He states the has been bed bound for the past two years. Patient has a chronic cath and colostomy. Patient's nurse comes and changes his cath every two weeks. Patient states that the non emergency ambulance takes him to appointments. Patient stated that if he was willing to go to SNF again if needed. He stated that he was previously at [facility] for nine months. He states [facility] and his second choice would be going back to [facility] if he had to.   Employment status:  Retired Health and safety inspectornsurance information:  Medicare PT Recommendations:  Not assessed at this time Information / Referral to community resources:  Skilled Nursing Facility  Patient/Family's Response to care:  Patient and family are agreeable to go to SNF if it is deemed necessary at d/c.  Patient/Family's Understanding of and Emotional Response to Diagnosis, Current Treatment, and  Prognosis:  Patient and family understand patient's diagnosis, treatment and prognosis.   Emotional Assessment Appearance:  Appears stated age Attitude/Demeanor/Rapport:   (Cooperative) Affect (typically observed):  Calm, Accepting Orientation:  Oriented to Self, Oriented to Place, Oriented to  Time, Oriented to Situation Alcohol / Substance use:  Not Applicable Psych involvement (Current and /or in the community):  No (Comment)  Discharge Needs  Concerns to be addressed:  Discharge Planning Concerns Readmission within the last 30 days:  No Current discharge risk:  Dependent with Mobility, Chronically ill Barriers to Discharge:  No Barriers Identified   Annice NeedySettle, Eean Buss D, LCSW 05/03/2016, 11:03 AM

## 2016-05-03 NOTE — Care Management Note (Signed)
Case Management Note  Patient Details  Name: Jon CutterWilliam P [name] MRN: [id number] Date of Birth: 1932/02/19  Subjective/Objective:                  Pt admitted with sepsis. He is from home, lives with his son who is his primary caregiver. Pt is total care and bed-bound. Pt has chronic foley. [facility] Pt has all necessary DME including hospital bed and WC. Pt plans to return home with resumption of HH services. Alroy BailiffLinda Lothian, [facility], is aware of admission and DC plan. She will obtain pt info from chart.   Action/Plan: Plan for DC home with resumption of HH. Pt will need order to resume HH at DC. [facility] will need to be notified of DC is over weekend. Pt will need EMS transport home.   Expected Discharge Date:     05/06/2016             Expected Discharge Plan:  Home w Home Health Services  In-House Referral:  Clinical Social Work  Discharge planning Services  CM Consult  Post Acute Care Choice:  Home Health, Resumption of Svcs/PTA Provider Choice offered to:  Patient  HH Arranged:    RN/PT/aid HH Agency:    [facility]  Status of Service:  In process, will continue to follow  Malcolm MetroChildress, Deidre Carino Demske, RN 05/03/2016, 1:40 PM

## 2016-05-04 ENCOUNTER — Inpatient Hospital Stay (HOSPITAL_COMMUNITY): Payer: Medicare Other

## 2016-05-04 DIAGNOSIS — I495 Sick sinus syndrome: Secondary | ICD-10-CM

## 2016-05-04 DIAGNOSIS — K311 Adult hypertrophic pyloric stenosis: Secondary | ICD-10-CM

## 2016-05-04 LAB — MAGNESIUM: MAGNESIUM: 2 mg/dL (ref 1.7–2.4)

## 2016-05-04 LAB — BASIC METABOLIC PANEL
Anion gap: 6 (ref 5–15)
BUN: 44 mg/dL — AB (ref 6–20)
CALCIUM: 7.9 mg/dL — AB (ref 8.9–10.3)
CO2: 19 mmol/L — ABNORMAL LOW (ref 22–32)
CREATININE: 1.28 mg/dL — AB (ref 0.61–1.24)
Chloride: 115 mmol/L — ABNORMAL HIGH (ref 101–111)
GFR calc Af Amer: 58 mL/min — ABNORMAL LOW (ref >60)
GFR, EST NON AFRICAN AMERICAN: 50 mL/min — AB (ref >60)
GLUCOSE: 116 mg/dL — AB (ref 65–99)
Potassium: 4.1 mmol/L (ref 3.5–5.1)
SODIUM: 140 mmol/L (ref 135–145)

## 2016-05-04 LAB — CBC
HEMATOCRIT: 32.4 % — AB (ref 39.0–52.0)
Hemoglobin: 10.6 g/dL — ABNORMAL LOW (ref 13.0–17.0)
MCH: 29.8 pg (ref 26.0–34.0)
MCHC: 32.7 g/dL (ref 30.0–36.0)
MCV: 91 fL (ref 78.0–100.0)
PLATELETS: 99 10*3/uL — AB (ref 150–400)
RBC: 3.56 MIL/uL — ABNORMAL LOW (ref 4.22–5.81)
RDW: 16 % — AB (ref 11.5–15.5)
WBC: 13.9 10*3/uL — ABNORMAL HIGH (ref 4.0–10.5)

## 2016-05-04 LAB — PROTIME-INR
INR: 2.91
PROTHROMBIN TIME: 31 s — AB (ref 11.4–15.2)

## 2016-05-04 MED ORDER — SODIUM CHLORIDE 0.9 % IV SOLN
1000.0000 mL | INTRAVENOUS | Status: DC
  Administered 2016-05-04 (×2): 1000 mL via INTRAVENOUS

## 2016-05-04 MED ORDER — FUROSEMIDE 10 MG/ML IJ SOLN
20.0000 mg | Freq: Once | INTRAMUSCULAR | Status: AC
  Administered 2016-05-04: 20 mg via INTRAVENOUS
  Filled 2016-05-04: qty 2

## 2016-05-04 NOTE — Plan of Care (Signed)
EMR REVIEWED FROM 2012 TO PRESENT. Pt admitted with likely UROSEPSIS NOV 16. THIS IS MOST LIKELY the source of his diarrhea. C DIFF Ag pos Toxin neg. D/C VANCOMYCIN. LAST EGD NOV 2016 PT HAD A J-SHAPED STOMACH. ACUTE ONSET OF NV LIKELY DUE TO UTI. FULL LIQUID DIET. FULL CONSULT TO FOLLOW NOV 19.

## 2016-05-04 NOTE — Progress Notes (Signed)
PROGRESS NOTE                                                                                                                                                                                                             Patient Demographics:    Jon [name], is a 82 y.o. male, DOB - Feb 28, 1932, ZOX:[id number]  Admit date - 05/02/2016   Admitting Physician Leroy Sea, MD  Outpatient Primary MD for the patient is Milana Obey, MD  LOS - 2  Chief Complaint  Patient presents with  . Hypotension  . Diarrhea  . Abdominal Pain       Brief Narrative  Jon [name]  is a 82 y.o. male, with a past medical history significant for PAF no anticoagulation due to fall risk, neuropathy, OSA, HTN, anxiety, s/p diverting colostomy & indwelling Foley due to Sacral Ulcer, who is bed bound of two years now presents from home via EMS with generalized weakness and diarrhea that started yesterday.   Patient was found weak and covered in feces by the EMS. He was covered in stool. He does have a mild generalized headache but denies any other symptoms. While in the ED, he was found to be septic with a low-grade fever, mild hypokalemia, severely low blood pressure, with an elevated lactic acid. He has been started on empirical antibiotics and admitted for further evaluation of sepsis due to UTI +/- Cdiff.   After admission to the hospital on 05/03/2016 he developed her sister nausea vomiting with imaging consistent with gastric outlet obstruction. NG tube has been placed and surgery has been consulted. He doesn't history of a small bowel mass on CT scan which appears to have been present for at least a few years.   Subjective:    Jon [name] today has, No headache, No chest pain, No abdominal pain - No Nausea, No new weakness tingling or numbness, No Cough - SOB.  He feels much better today After NG tube was placed on  05/03/2016.   Assessment  & Plan :   1. Sepsis secondary to UTI from indwelling Foley. On admission, patient was noted to have a low grade fevers with severe hypotension, mild hypokalemia, and an elevated lactic acid. Urinalysis was indicative of infection. Will follow cultures and continue empiric antibiotic Rocephin based on Previous urine culture and sensitivity  data. Note Foley catheter was changed 3 days ago by home health staff. Although he remains hypotensive he appears nontoxic and second admission he feels about 60-70% better. Continue IV fluids and bolus as needed. Continue hydrocortisone IV.  2. Diarrhea. He does have diarrhea in his colostomy bag,  his C. Diff panel was inconclusive, he has exposure to multiple antibiotics for recurrent UTIs and clinically is at risk for C. difficile, since he is extremely sick for now we will put him on oral vancomycin and continue to monitor .   3. Abdominal pain, persistent nausea vomiting 05/03/2016. 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 He did eat some steak on 05/03/2016 and a heavy breakfast after which his symptoms arose. NG tube was placed and stomach appears to have decompressed on 05/04/2016, massive return noted from stomach overnight. We will involve general surgery. Have explained to the patient and his son that if any surgical intervention is needed for this problem he will be at extreme high risk for adverse cardiopulmonary outcome. They understand this. Surgery requested to provide input. For now continue NG and nothing by mouth.   4. Paroxysmal atrial fibrillation - history of tachybradycardia syndrome status post pacemaker placement. [place] VASC2 score of at least 3. Patient is not anticoagulated on any medications due to his fall risk.  Will defer to his primary care physician. EKG shows atrial-paced rhythm.  Will hold beta blocker until pressures have stabilized.   5. Essential HTN. Pressures are severely low, will hold beta-blocker until pressures have stabilized. For now IV fluids per sepsis protocol, when necessary IV fluid bolus, stress dose IV steroids, echocardiogram shows a preserved EF of 60% with chronic grade 2 diastolic dysfunction.  6. Anxiety. Continue xanax.   7. OSA. Continue CPAP.   8. HX of sacral decubitus ulcer. He was recently discharged from [facility] nursing home per son this ulcer has completely healed, we will continue to monitor.  9. Non specific CT findings of bladder stone, bladder thickening, bowel mass which appears chronic and benign. We'll defer this to PCP for monitoring with outpatient age-appropriate workup and follow-up.  10. ARF due to severe dehydration and sepsis. Improving with hydration. Continue to monitor.  11. Hypomagnesemia/Hypokalemia. Replaced both potassium and magnesium.  12. Borderline High QTC. Discussed with Dr. Diona Browner cardiologist, it's actually better than his baseline, a year ago while on sotalol his QTC was in 650 range. Sotalol will be commenced once he is taking oral medications.    Family Communication  :  Son  Code Status :  DNR  Diet : Diet NPO time specified Except for: Sips with Meds    Disposition Plan  :  Step down  Consults  :  None  Procedures  :    TTE - Mild LVH with LVEF 60-65%. Grade 2 diastolic dysfunction. Moderate biatrial enlargement. Mitral annular calcification with mild mitral regurgitation. Aortic valve appears sclerotic, not well visualized. Mildly dilated right ventricle with device wire present. Mild tricuspid regurgitation with RV-RA gradient 30 mmHg. No pericardial effusion.  CT Abd Pelvis - ? gastric outlet obstruction, small bowel mass.    DVT Prophylaxis  :   Heparin   Lab Results  Component Value Date   PLT 77 (L) 05/03/2016    Inpatient Medications  Scheduled Meds: . cefTRIAXone  (ROCEPHIN)  IV  1 g Intravenous Q24H  . Chlorhexidine Gluconate Cloth  6 each Topical Q0600  . Chlorhexidine Gluconate Cloth  6 each Topical Q0600  . feeding supplement (PRO-STAT SUGAR FREE 64)  30 mL Oral TID WC  . heparin  5,000 Units Subcutaneous Q8H  . hydrocortisone sod succinate (SOLU-CORTEF) inj  100 mg Intravenous Q8H  . mupirocin ointment  1 application Nasal BID  . mupirocin ointment  1 application Nasal BID  . naphazoline-glycerin  1 drop Both Eyes TID  . sodium chloride flush  3 mL Intravenous Q12H  . sotalol  160 mg Oral BID  . vancomycin  250 mg Oral Q6H   Continuous Infusions: . sodium chloride     PRN Meds:.acetaminophen, ALPRAZolam, HYDROcodone-acetaminophen, ondansetron (ZOFRAN) IV, promethazine, sodium chloride  Antibiotics  :    Anti-infectives    Start     Dose/Rate Route Frequency Ordered Stop   05/02/16 1300  vancomycin (VANCOCIN) 50 mg/mL oral solution 250 mg     250 mg Oral Every 6 hours 05/02/16 1248     05/02/16 1200  cefTRIAXone (ROCEPHIN) 1 g in dextrose 5 % 50 mL IVPB     1 g 100 mL/hr over 30 Minutes Intravenous Every 24 hours 05/02/16 1104 05/07/16 1459   05/02/16 0900  piperacillin-tazobactam (ZOSYN) IVPB 3.375 g     3.375 g 100 mL/hr over 30 Minutes Intravenous  Once 05/02/16 0854 05/02/16 0932   05/02/16 0900  vancomycin (VANCOCIN) IVPB 1000 mg/200 mL premix     1,000 mg 200 mL/hr over 60 Minutes Intravenous  Once 05/02/16 0854 05/02/16 1001         Objective:   Vitals:   05/04/16 0300 05/04/16 0400 05/04/16 0500 05/04/16 0600  BP: 116/78 117/84 123/86 (!) 130/103  Pulse: 72 73 73 77  Resp: 12 13 12 14   Temp:  (!) 96.9 F (36.1 C)    TempSrc:  Axillary    SpO2: 100% 100% 100% 100%  Weight:  67.6 kg (149 lb 0.5 oz)    Height:        Wt Readings from Last 3 Encounters:  05/04/16 67.6 kg (149 lb 0.5 oz)  05/15/15 63.8 kg (140 lb 10.5 oz)  05/09/15 87.5 kg (193 lb)     Intake/Output Summary (Last 24 hours) at 05/04/16  0840 Last data filed at 05/04/16 0600  Gross per 24 hour  Intake             1973 ml  Output              500 ml  Net             1473 ml     Physical Exam  Awake Alert, Oriented X 3, No new F.N deficits, Normal affect Lane.AT,PERRAL Supple Neck,No JVD, No cervical lymphadenopathy appriciated.  Symmetrical Chest wall movement, Good air movement bilaterally, CTAB RRR,No Gallops,Rubs or new Murmurs, No Parasternal Heave +ve B.Sounds, Abd Soft, No tenderness, No organomegaly appriciated, No rebound - guarding or rigidity. Colostomy in place with liquid stool, chronic indwelling Foley No Cyanosis, Clubbing or edema, No new Rash or bruise, bilateral cam boots    Data Review:    CBC  Recent Labs Lab 05/02/16 0842 05/03/16 0417  WBC 5.3 11.6*  HGB 10.7* 9.9*  HCT 32.5* 30.5*  PLT 65* 77*  MCV 91.8 93.3  MCH 30.2 30.3  MCHC 32.9 32.5  RDW 15.3 15.2  LYMPHSABS 0.4*  --   MONOABS 0.2  --   EOSABS 0.0  --   BASOSABS 0.0  --  Chemistries   Recent Labs Lab 05/02/16 0842 05/03/16 0417 05/04/16 0541  NA 133* 137 140  K 3.4* 4.9 4.1  CL 107 115* 115*  CO2 18* 17* 19*  GLUCOSE 74 121* 116*  BUN 45* 42* 44*  CREATININE 1.95* 1.66* 1.28*  CALCIUM 7.7* 7.3* 7.9*  MG 1.4*  --  2.0  AST 26  --   --   ALT 11*  --   --   ALKPHOS 42  --   --   BILITOT 1.0  --   --    ------------------------------------------------------------------------------------------------------------------ No results for input(s): CHOL, HDL, LDLCALC, TRIG, CHOLHDL, LDLDIRECT in the last 72 hours.  No results found for: HGBA1C ------------------------------------------------------------------------------------------------------------------ No results for input(s): TSH, T4TOTAL, T3FREE, THYROIDAB in the last 72 hours.  Invalid input(s): FREET3 ------------------------------------------------------------------------------------------------------------------ No results for input(s): VITAMINB12,  FOLATE, FERRITIN, TIBC, IRON, RETICCTPCT in the last 72 hours.  Coagulation profile  Recent Labs Lab 05/02/16 1600  INR 2.01    No results for input(s): DDIMER in the last 72 hours.  Cardiac Enzymes  Recent Labs Lab 05/02/16 0842  TROPONINI 0.03*   ------------------------------------------------------------------------------------------------------------------ No results found for: BNP  Micro Results Recent Results (from the past 240 hour(s))  Urine culture     Status: Abnormal   Collection Time: 05/02/16  8:15 AM  Result Value Ref Range Status   Specimen Description URINE, CLEAN CATCH  Final   Special Requests NONE  Final   Culture MULTIPLE SPECIES PRESENT, SUGGEST RECOLLECTION (A)  Final   Report Status 05/03/2016 FINAL  Final  Gastrointestinal Panel by PCR , Stool     Status: None   Collection Time: 05/02/16  8:33 AM  Result Value Ref Range Status   Campylobacter species NOT DETECTED NOT DETECTED Final   Plesimonas shigelloides NOT DETECTED NOT DETECTED Final   Salmonella species NOT DETECTED NOT DETECTED Final   Yersinia enterocolitica NOT DETECTED NOT DETECTED Final   Vibrio species NOT DETECTED NOT DETECTED Final   Vibrio cholerae NOT DETECTED NOT DETECTED Final   Enteroaggregative E coli (EAEC) NOT DETECTED NOT DETECTED Final   Enteropathogenic E coli (EPEC) NOT DETECTED NOT DETECTED Final   Enterotoxigenic E coli (ETEC) NOT DETECTED NOT DETECTED Final   Shiga like toxin producing E coli (STEC) NOT DETECTED NOT DETECTED Final   Shigella/Enteroinvasive E coli (EIEC) NOT DETECTED NOT DETECTED Final   Cryptosporidium NOT DETECTED NOT DETECTED Final   Cyclospora cayetanensis NOT DETECTED NOT DETECTED Final   Entamoeba histolytica NOT DETECTED NOT DETECTED Final   Giardia lamblia NOT DETECTED NOT DETECTED Final   Adenovirus F40/41 NOT DETECTED NOT DETECTED Final   Astrovirus NOT DETECTED NOT DETECTED Final   Norovirus GI/GII NOT DETECTED NOT DETECTED Final    Rotavirus A NOT DETECTED NOT DETECTED Final   Sapovirus (I, II, IV, and V) NOT DETECTED NOT DETECTED Final  C difficile quick scan w PCR reflex     Status: Abnormal   Collection Time: 05/02/16  8:33 AM  Result Value Ref Range Status   C Diff antigen POSITIVE (A) NEGATIVE Final   C Diff toxin NEGATIVE NEGATIVE Final   C Diff interpretation Results are indeterminate. See PCR results.  Final  Clostridium Difficile by PCR     Status: Abnormal   Collection Time: 05/02/16  8:33 AM  Result Value Ref Range Status   Toxigenic C Difficile by pcr POSITIVE (A) NEGATIVE Final    Comment: Positive for toxigenic C. difficile with little to no toxin production. Only  treat if clinical presentation suggests symptomatic illness. Performed at [facility]   Blood Culture (routine x 2)     Status: None (Preliminary result)   Collection Time: 05/02/16  9:24 AM  Result Value Ref Range Status   Specimen Description BLOOD RIGHT HAND  Final   Special Requests BOTTLES DRAWN AEROBIC ONLY 5CC  Final   Culture NO GROWTH 1 DAY  Final   Report Status PENDING  Incomplete  Blood Culture (routine x 2)     Status: None (Preliminary result)   Collection Time: 05/02/16  9:30 AM  Result Value Ref Range Status   Specimen Description BLOOD RIGHT ANTECUBITAL  Final   Special Requests BOTTLES DRAWN AEROBIC AND ANAEROBIC 8CC  Final   Culture NO GROWTH 1 DAY  Final   Report Status PENDING  Incomplete  MRSA PCR Screening     Status: Abnormal   Collection Time: 05/02/16 12:50 PM  Result Value Ref Range Status   MRSA by PCR POSITIVE (A) NEGATIVE Final    Comment:        The GeneXpert MRSA Assay (FDA approved for NASAL specimens only), is one component of a comprehensive MRSA colonization surveillance program. It is not intended to diagnose MRSA infection nor to guide or monitor treatment for MRSA infections. RESULT CALLED TO, READ BACK BY AND VERIFIED WITH: ROSE,C ON 05/02/16 AT 2230 BY LOY,C     Radiology  Reports Ct Abdomen Pelvis Wo Contrast  Result Date: 05/03/2016 CLINICAL DATA:  Abdominal pain, diarrhea, sepsis, low-grade fever. EXAM: CT ABDOMEN AND PELVIS WITHOUT CONTRAST TECHNIQUE: Multidetector CT imaging of the abdomen and pelvis was performed following the standard protocol without IV contrast. COMPARISON:  CT scan 05/02/2016 FINDINGS: Lower chest: Interval development of small bilateral pleural effusions and overlying bibasilar atelectasis. Hepatobiliary: No focal hepatic lesions or intrahepatic biliary dilatation. Numerous layering gallstones are again noted in the gallbladder. No common bile duct dilatation. Pancreas: No mass, inflammation or ductal dilatation. Mild pancreatic atrophy. Spleen: Normal size.  No focal lesions. Adrenals/Urinary Tract: Mild nodularity of both adrenal glands is stable. There are numerous bilateral simple and hemorrhagic cysts. No new lesions. Extensive renal artery calcifications and small renal calculi. No obstructing ureteral calculi or hydronephrosis. Numerous bladder calculi are again demonstrated. Thick walled bladder containing a Foley catheter. Stomach/Bowel: The stomach is moderately distended. There is a persistent 3 cm soft tissue mass along the anti mesenteric border of the descending duodenum. This could be a benign gist tumor. It could be causing partial gastric outlet obstruction. The duodenum, small bowel and colon are otherwise unremarkable. No inflammatory changes, mass lesions or obstructive findings. Stable left lower quadrant colostomy. No complicating features. Small parastomal hernia. Vascular/Lymphatic: Stable advanced atherosclerotic calcifications involving the aorta. No mesenteric or retroperitoneal mass or adenopathy. Reproductive: The prostate gland and seminal vesicles are grossly normal. Other: No pelvic mass or lymphadenopathy. Small amount of free pelvic fluid. No inguinal mass or adenopathy. Musculoskeletal: Scarring changes from previous  decubitus ulcers. I do not see an obvious ulcer or definite changes of osteomyelitis. Diffuse osteoporosis. IMPRESSION: 1. New small bilateral pleural effusions with overlying atelectasis. 2. Distended stomach suggesting gastroparesis or partial gastric outlet obstruction. There is a persistent 3 cm duodenal or periduodenal mass. 3. Numerous renal cysts are stable. Small renal calculi but no obstructing ureteral calculi. 4. Numerous stable bladder calculi and diffusely thick walled bladder. 5. Cholelithiasis. 6. Left lower quadrant colostomy without complicating features. 7. Stable advanced atherosclerotic calcifications involving the aorta and branch  vessels. Electronically Signed   By: Rudie Meyer M.D.   On: 05/03/2016 16:12   Ct Abdomen Pelvis Wo Contrast  Result Date: 05/02/2016 CLINICAL DATA:  Generalized abdominal pain and diarrhea for several days. EXAM: CT ABDOMEN AND PELVIS WITHOUT CONTRAST TECHNIQUE: Multidetector CT imaging of the abdomen and pelvis was performed following the standard protocol without IV contrast. COMPARISON:  06/15/2014 and chest CT on 05/06/2006 FINDINGS: Lower chest: No acute findings. Hepatobiliary: No masses visualized on this unenhanced exam. Multiple calcified gallstones are again seen, without evidence of acute cholecystitis or biliary ductal dilatation. Pancreas:  Phone Spleen:  Within normal limits in size. Adrenals/Urinary tract: Multiple simple and hemorrhagic renal cysts are again seen bilaterally which are stable. Largest cyst arising from the lateral lower pole the left kidney extends inferiorly into the right lower quadrant measures 15 cm. These remain stable. Numerous small 5 mm calculi are seen within the right kidney and renal collecting system including the renal pelvis. There is no evidence hydronephrosis. No evidence of ureteral calculi or dilatation. Foley catheter is seen in place with decompressed urinary bladder. Diffuse bladder wall thickening is  consistent with chronic bladder outlet obstruction. Multiple calculi are seen within the urinary bladder which have increased in size and number since previous study. Largest calculus measures 2.1 cm. Stomach/Bowel: No evidence of obstruction, inflammatory process, or abnormal fluid collections. Left lower quadrant colostomy noted. A right upper quadrant soft tissue mass is seen which abuts the proximal duodenum. This mass is homogeneous and shows mild peripheral calcification. This measures 2.8 x 3.2 cm on image 26/2 and shows minimal increase in size compared to previous chest CT dating back to 05/06/2006 when it measured 2.4 x 2.7 cm. Vascular/Lymphatic: No pathologically enlarged lymph nodes identified. No evidence of abdominal aortic aneurysm. Aortic atherosclerosis. Reproductive:  Mildly enlarged prostate gland. Other:  None. Musculoskeletal: No suspicious bone lesions identified. Advanced lumbar degenerative spondylosis and bilateral hip osteoarthritis noted. IMPRESSION: No acute findings within the abdomen or pelvis. Increased number and size of bladder calculi measuring up to 2.1 cm. Diffuse bladder wall thickening, consistent with chronic bladder outlet obstruction. Urinary bladder is decompressed by Foley catheter. Stable mildly enlarged prostate. Multiple tiny nonobstructive right renal calculi. No evidence of ureteral calculi or hydronephrosis. Stable bilateral renal cysts, largest in left kidney measuring 15 cm. Cholelithiasis.  No radiographic evidence of cholecystitis. Minimal increase in size of 3.2 cm right upper quadrant mass abutting the proximal duodenum compared to previous studies dating back to 2007. This is likely benign in etiology, and differential diagnosis includes leiomyoma and GI stromal tumor. Consider endoscopic ultrasound or continued attention on follow-up imaging. Electronically Signed   By: Myles Rosenthal M.D.   On: 05/02/2016 10:43   Dg Chest Port 1 View  Result Date:  05/04/2016 CLINICAL DATA:  Shortness of Breath EXAM: PORTABLE CHEST 1 VIEW COMPARISON:  05/02/2016 FINDINGS: Small left pleural effusion with left base atelectasis. Heart is normal size. Left pacer remains in place, unchanged. Right lung is clear. Underlying COPD. IMPRESSION: Small left pleural effusion with left base atelectasis. COPD. Electronically Signed   By: Charlett Nose M.D.   On: 05/04/2016 08:22   Dg Chest Port 1 View  Result Date: 05/02/2016 CLINICAL DATA:  Generalized weakness. EXAM: PORTABLE CHEST 1 VIEW COMPARISON:  05/15/2015 FINDINGS: Stable aortic tortuosity. The heart size is normal. Stable appearance of dual-chamber pacemaker There is no evidence of pulmonary edema, consolidation, pneumothorax, nodule or pleural fluid. The visualized skeletal structures are unremarkable. IMPRESSION:  No active disease. Electronically Signed   By: Irish LackGlenn  Yamagata M.D.   On: 05/02/2016 09:09   Dg Abd Portable 1v  Result Date: 05/04/2016 CLINICAL DATA:  NG tube placement EXAM: PORTABLE ABDOMEN - 1 VIEW COMPARISON:  05/03/2016 FINDINGS: NG tube has been placed with the tip in the stomach. Decreasing gaseous distention of the stomach. No evidence for bowel obstruction, free air organomegaly. IMPRESSION: NG tube tip in the stomach which is decompressed. Electronically Signed   By: Charlett NoseKevin  Dover M.D.   On: 05/04/2016 08:21   Dg Abd Portable 1v  Result Date: 05/03/2016 CLINICAL DATA:  Nausea and abdominal pain. EXAM: PORTABLE ABDOMEN - 1 VIEW COMPARISON:  05/02/2016 FINDINGS: Gaseous distention of the stomach. No significant small bowel gaseous distension. Degenerative changes in lower lumbar spine with scoliosis. Again noted is a Foley catheter. Multiple calcifications in the pelvis and some of these represent bladder calculi based on the prior CT. There is an ostomy in the left lower abdomen. Moderate joint space narrowing in the right hip. IMPRESSION: Gaseous distension of the stomach. Foley catheter with  bladder calcifications as described. Electronically Signed   By: Richarda OverlieAdam  Henn M.D.   On: 05/03/2016 11:42    Time Spent in minutes  30   Aviela Blundell K M.D on 05/04/2016 at 8:40 AM  Between 7am to 7pm - Pager - [phone]  After 7pm go to [email or web address] - password [email or web address]  [facility] -  Office  [phone]

## 2016-05-04 NOTE — Consult Note (Signed)
SURGICAL CONSULTATION NOTE (initial) - cpt: 99254  HISTORY OF PRESENT ILLNESS (HPI):  82 y.o. non-ambulatory male presented to [facility] ED 2 days ago after being found weakened and covered in feces following 1 day of diarrhea from his diverting colostomy created in 06/2014 to facilitate healing of a chronic sacral pressure wound along with a chronic indwelling Foley catheter. Upon presentation, patient was hypotensive and febrile to 101.3 with hypokalemia and lactic acidosis. UTI +/- C. Difficile was suspected, antibiotics were started, and upon stabilization he was ordered a soft diet. After eating, the patient developed nausea and vomiting. Patient reports he also (atypically for him) ate a large steak dinner 2 days prior to his presentation followed by a large heavy breakfast and lunch the day prior to his presenting to [facility]. He also adds that he's previously developed nausea and vomiting after he ate another large heavy meal. An NG tube was placed, and CT abdomen/pelvis was performed. Patient otherwise denies any abdominal pain, CP, or SOB and describes resolution of N/V after 2L drained from NGT.  Surgery is consulted by medical physician Dr. Thedore MinsSingh in this context for evaluation and management of small bowel obstruction.  PAST MEDICAL HISTORY (PMH):  Past Medical History:  Diagnosis Date  . Abnormality of gait   . B12 deficiency   . Bilateral hydronephrosis   . Bladder calculi   . Bleeding ulcer   . Essential hypertension   . Essential tremor   . Macular degeneration of left eye   . Memory loss   . Multiple rib fractures   . Neuropathy (HCC)   . OSA (obstructive sleep apnea)    Does not use CPAP  . PAF (paroxysmal atrial fibrillation) (HCC)   . Tachycardia-bradycardia syndrome (HCC)    [employer] PPM - Dr. Royann Shiversroitoru  . Varicose veins    Bilateral     PAST SURGICAL HISTORY (PSH):  Past Surgical History:  Procedure Laterality Date  . CATARACT EXTRACTION Bilateral 2013  . COLOSTOMY N/A  07/11/2014   Procedure: DIVERTING COLOSTOMY;  Surgeon: Dalia HeadingMark A Jenkins, MD;  Location: [facility];  Service: General;  Laterality: N/A;  wound class: clean contaminated  . ESOPHAGOGASTRODUODENOSCOPY N/A 05/16/2015   Procedure: ESOPHAGOGASTRODUODENOSCOPY (EGD);  Surgeon: West BaliSandi L Fields, MD;  Location: [facility];  Service: Endoscopy;  Laterality: N/A;  . INCISION AND DRAINAGE OF WOUND N/A 07/11/2014   Procedure: DEBRIDEMENT OF SACRUM;  Surgeon: Dalia HeadingMark A Jenkins, MD;  Location: [facility];  Service: General;  Laterality: N/A;  . MENINGIOMA REMOVAL  1992   POST RT FRONTAL   . PACEMAKER INSERTION  2006  . PERMANENT PACEMAKER GENERATOR CHANGE N/A 08/13/2012   Procedure: PERMANENT PACEMAKER GENERATOR CHANGE;  Surgeon: Thurmon FairMihai Croitoru, MD;  Location: [facility];  Service: Cardiovascular;  Laterality: N/A;     MEDICATIONS:  Prior to Admission medications   Medication Sig Start Date End Date Taking? Authorizing Provider  ALPRAZolam Prudy Feeler(XANAX) 1 MG tablet Take 0.5 tablets (0.5 mg total) by mouth 2 (two) times daily as needed for anxiety. Patient taking differently: Take 0.5 mg by mouth 5 (five) times daily as needed for anxiety.  06/18/14  Yes Hollice EspySendil K Krishnan, MD  diphenhydrAMINE (BENADRYL) 25 MG tablet Take 25 mg by mouth every 6 (six) hours as needed for itching.   Yes Historical Provider, MD  HYDROcodone-acetaminophen (NORCO) 10-325 MG tablet Take 1 tablet by mouth every 6 (six) hours as needed for moderate pain.  05/04/15  Yes Historical Provider, MD  lisinopril (PRINIVIL,ZESTRIL) 10 MG  tablet Take 1 tablet by mouth daily. 03/16/16  Yes Historical Provider, MD  Multiple Vitamins-Minerals (ICAPS AREDS FORMULA PO) Take 1 tablet by mouth daily.   Yes Historical Provider, MD  nystatin (MYCOSTATIN/NYSTOP) powder Apply 1 application topically daily. 02/20/16  Yes Historical Provider, MD  oxyCODONE-acetaminophen (PERCOCET) 10-325 MG tablet Take 1 tablet by mouth every 6 (six) hours as needed for pain (for breakthrough  pain).  02/28/16  Yes Historical Provider, MD  pantoprazole (PROTONIX) 40 MG tablet Take 1 tablet (40 mg total) by mouth 2 (two) times daily before a meal. 05/18/15  Yes Erick Blinks, MD  sotalol (BETAPACE) 160 MG tablet Take 1 tablet (160 mg total) by mouth 2 (two) times daily. Need appointment for future refills 06/06/15  Yes Mihai Croitoru, MD  warfarin (COUMADIN) 2.5 MG tablet Take 1 tablet by mouth daily. 04/20/16  Yes Historical Provider, MD  Tetrahydrozoline HCl (EYE DROPS OP) Apply 2 drops to eye daily as needed (irritation).    Historical Provider, MD     ALLERGIES:  Allergies  Allergen Reactions  . Iohexol      Desc: PT STATES THROAT/FACE SWELLING W/ IVP DYE IN 1990. PT HAS NOT HAD ANY EXAMS DONE W/DYE SINCE AND HAS NEVER BEEN PRE MEDICATED. PER PT, Onset Date: [id number]   . Shellfish Allergy   . Dilantin [Phenytoin Sodium Extended] Rash  . Tegretol [Carbamazepine] Rash     SOCIAL HISTORY:  Social History   Social History  . Marital status: Married    Spouse name: N/A  . Number of children: 2  . Years of education: COLLEGE   Occupational History  . RETIRED    Social History Main Topics  . Smoking status: Former Smoker    Packs/day: 0.75    Years: 25.00    Types: Cigarettes  . Smokeless tobacco: Never Used     Comment: QUIT 35 YRS AGO  . Alcohol use No  . Drug use: No  . Sexual activity: No   Other Topics Concern  . Not on file   Social History Narrative  . No narrative on file    The patient currently resides (home / rehab facility / nursing home): Nursing home  The patient normally is (ambulatory / bedbound): Bed-bound/Non-ambulatory  FAMILY HISTORY:  Family History  Problem Relation Age of Onset  . Aortic aneurysm Mother   . Parkinsonism Mother   . Heart disease Father   . Esophageal cancer Sister   . Colon cancer Neg Hx     REVIEW OF SYSTEMS:  Constitutional: denies weight loss, fever, chills, or sweats  Eyes: denies any other vision changes,  history of eye injury  ENT: denies sore throat, hearing problems  Respiratory: denies shortness of breath, wheezing  Cardiovascular: denies chest pain, palpitations  Gastrointestinal: abdominal pain, N/V, and diarrhea as per HPI Genitourinary: denies burning with urination or urinary frequency Musculoskeletal: denies any other joint pains or cramps  Skin: denies any other rashes or skin discolorations  Neurological: denies any other headache, dizziness, weakness  Psychiatric: denies any other depression, anxiety   All other review of systems were negative   VITAL SIGNS:  Temp:  [96.9 F (36.1 C)-97.5 F (36.4 C)] 96.9 F (36.1 C) (11/18 0400) Pulse Rate:  [67-91] 77 (11/18 0600) Resp:  [12-14] 14 (11/18 0600) BP: (116-155)/(78-117) 130/103 (11/18 0600) SpO2:  [100 %] 100 % (11/18 0600) Weight:  [67.6 kg (149 lb 0.5 oz)] 67.6 kg (149 lb 0.5 oz) (11/18 0400)     Height:  5\' 10"  (177.8 cm) Weight: 67.6 kg (149 lb 0.5 oz) BMI (Calculated): 19.8   INTAKE/OUTPUT:  This shift: No intake/output data recorded.  Last 2 shifts: @IOLAST2SHIFTS @   PHYSICAL EXAM:  Constitutional:  -- Normal thin body habitus  -- Awake, alert, and oriented x3  Eyes:  -- Pupils equally round and reactive to light  -- No scleral icterus  Ear, nose, and throat:  -- No jugular venous distension  Pulmonary:  -- No crackles  -- Equal breath sounds bilaterally -- Breathing non-labored at rest Cardiovascular:  -- S1, S2 present  -- No pericardial rubs Gastrointestinal:  -- Abdomen soft, nontender, nondistended, no guarding/rebound, pink healthy LLQ ostomy with stool and gas in bag  -- No abdominal masses appreciated, pulsatile or otherwise  Musculoskeletal and Integumentary:  -- Wounds or skin discoloration: None appreciated -- Extremities: B/L UE FROM, hands and feet warm, no edema, NT  Labs:  CBC:  Lab Results  Component Value Date   WBC 13.9 (H) 05/04/2016   RBC 3.56 (L) 05/04/2016   BMP:  Lab  Results  Component Value Date   GLUCOSE 116 (H) 05/04/2016   CO2 19 (L) 05/04/2016   BUN 44 (H) 05/04/2016   CREATININE 1.28 (H) 05/04/2016   CALCIUM 7.9 (L) 05/04/2016   Coagulation:  Lab Results  Component Value Date   INR 2.91 05/04/2016     Imaging studies:  CT Abdomen and Pelvis (05/03/2016) - personally reviewed, including with patient and his family 1. New small bilateral pleural effusions with overlying atelectasis. 2. Moderately distended stomach suggests gastroparesis or partial gastric outlet obstruction. There is a persistent 3 cm soft tissue mass along the anti mesenteric border of the descending duodenum first noted in 2007. This could be a benign gist tumor. It could be causing partial gastric outlet obstruction. The duodenum, small bowel, and colon are otherwise unremarkable. No inflammatory changes, mass lesions, or obstructive findings. 3. Numerous renal cysts are stable. Small renal calculi but no obstructing ureteral calculi. 4. Numerous stable bladder calculi and diffusely thick walled bladder. 5. Stable left lower quadrant colostomy with small parastomal hernia without complicating features. 6. Stable advanced atherosclerotic calcifications involving the aorta and branch vessels.  Assessment/Plan: (ICD-10's: K31.84, K31.5) 82 y.o. non-ambulatory male with gastroparesis vs pyloric stricture vs partial gastric outlet obstruction with chronic duodenal mass, essentially unchanged on CT since 2007, complicated by resolved hypovolemic and possibly septic shock and by pertinent comorbidities including history of bleeding gastric and duodenal ulcers (first diagnosed 1950's per patient), HTN, atrial fibrillation not on anticoagulation due to fall risk, tachycardia-bradycardia syndrome with pacemaker, OSA (not on CPAP), and bladder calculus with B/L hydronephrosis.   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  Thank you for the opportunity to participate in this patient's care.   -- Barbara CowerJason  Honor Loh, MD, [place]: [facility] Office: [phone]

## 2016-05-05 DIAGNOSIS — A419 Sepsis, unspecified organism: Secondary | ICD-10-CM

## 2016-05-05 DIAGNOSIS — R197 Diarrhea, unspecified: Secondary | ICD-10-CM

## 2016-05-05 LAB — BASIC METABOLIC PANEL
ANION GAP: 6 (ref 5–15)
BUN: 37 mg/dL — ABNORMAL HIGH (ref 6–20)
CO2: 20 mmol/L — ABNORMAL LOW (ref 22–32)
Calcium: 7.9 mg/dL — ABNORMAL LOW (ref 8.9–10.3)
Chloride: 113 mmol/L — ABNORMAL HIGH (ref 101–111)
Creatinine, Ser: 1.13 mg/dL (ref 0.61–1.24)
GFR calc Af Amer: >60 mL/min (ref >60)
GFR, EST NON AFRICAN AMERICAN: 59 mL/min — AB (ref >60)
GLUCOSE: 108 mg/dL — AB (ref 65–99)
POTASSIUM: 3.8 mmol/L (ref 3.5–5.1)
SODIUM: 139 mmol/L (ref 135–145)

## 2016-05-05 LAB — CBC
HCT: 30.8 % — ABNORMAL LOW (ref 39.0–52.0)
HEMOGLOBIN: 10.2 g/dL — AB (ref 13.0–17.0)
MCH: 30.3 pg (ref 26.0–34.0)
MCHC: 33.1 g/dL (ref 30.0–36.0)
MCV: 91.4 fL (ref 78.0–100.0)
PLATELETS: 89 10*3/uL — AB (ref 150–400)
RBC: 3.37 MIL/uL — AB (ref 4.22–5.81)
RDW: 15.8 % — ABNORMAL HIGH (ref 11.5–15.5)
WBC: 11.2 10*3/uL — AB (ref 4.0–10.5)

## 2016-05-05 LAB — MAGNESIUM: MAGNESIUM: 1.8 mg/dL (ref 1.7–2.4)

## 2016-05-05 MED ORDER — HYDROCORTISONE NA SUCCINATE PF 100 MG IJ SOLR
50.0000 mg | Freq: Every day | INTRAMUSCULAR | Status: DC
  Administered 2016-05-06: 50 mg via INTRAVENOUS
  Filled 2016-05-05: qty 2

## 2016-05-05 MED ORDER — HYDRALAZINE HCL 20 MG/ML IJ SOLN: 10.0000 mg | Freq: Four times a day (QID) | INTRAMUSCULAR | Status: DC | PRN

## 2016-05-05 NOTE — Progress Notes (Signed)
PROGRESS NOTE                                                                                                                                                                                                             Patient Demographics:    Jon [name], is a 82 y.o. male, DOB - 1932-04-15, ZOX:[id number]  Admit date - 05/02/2016   Admitting Physician Leroy Sea, MD  Outpatient Primary MD for the patient is Milana Obey, MD  LOS - 3  Chief Complaint  Patient presents with  . Hypotension  . Diarrhea  . Abdominal Pain       Brief Narrative  Jon [name]  is a 82 y.o. male, with a past medical history significant for PAF no anticoagulation due to fall risk, neuropathy, OSA, HTN, anxiety, s/p diverting colostomy & indwelling Foley due to Sacral Ulcer, who is bed bound of two years now presents from home via EMS with generalized weakness and diarrhea that started yesterday.   Patient was found weak and covered in feces by the EMS. He was covered in stool. He does have a mild generalized headache but denies any other symptoms. While in the ED, he was found to be septic with a low-grade fever, mild hypokalemia, severely low blood pressure, with an elevated lactic acid. He has been started on empirical antibiotics and admitted for further evaluation of sepsis due to UTI +/- Cdiff.   After admission to the hospital on 05/03/2016 he developed her sister nausea vomiting with imaging consistent with gastric outlet obstruction. NG tube has been placed and surgery has been consulted. He doesn't history of a small bowel mass on CT scan which appears to have been present for at least a few years.   Subjective:    Jon [name] today has, No headache, No chest pain, No abdominal pain - No Nausea, No new weakness tingling or numbness, No Cough - SOB.  He feels much better today After NG tube was placed on  05/03/2016.   Assessment  & Plan :   1. Sepsis secondary to UTI from indwelling Foley. On admission, patient was noted to have a low grade fevers with severe hypotension, mild hypokalemia, and an elevated lactic acid. Urinalysis was indicative of infection. Will follow cultures and continue empiric antibiotic Rocephin based on Previous urine culture and sensitivity  data. Note Foley catheter was changed 3 days ago by home health staff. His physiology has resolved blood pressures have stabilized, start tapering stress dose steroids and stop IV fluids.  2. Diarrhea. He does have diarrhea in his colostomy bag,  his C. Diff panel was inconclusive, he has exposure to multiple antibiotics for recurrent UTIs and clinically is at risk for C. difficile, since he is extremely sick for now we will put him on oral vancomycin and continue to monitor .   3. Abdominal pain, persistent nausea vomiting 05/03/2016. 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 He did eat some steak on 05/03/2016 and a heavy breakfast after which his symptoms arose. NG tube was placed and stomach appears to have decompressed on 05/04/2016, massive return noted from stomach was noted.   Seen by Surgery thought it is gastroparesis, symptoms not better NG tube removed placed on liquid diet, GI requested to see him as well.   4. Paroxysmal atrial fibrillation - history of tachybradycardia syndrome status post pacemaker placement. [place] VASC2 score of at least 3. Patient is not anticoagulated on any medications due to his fall risk.  Will defer to his primary care physician. EKG shows atrial-paced rhythm. Will hold beta blocker until pressures have stabilized.   5. Essential HTN. Pressures are severely low, will hold beta-blocker until pressures have stabilized. For now IV fluids per sepsis protocol, when necessary IV  fluid bolus, stress dose IV steroids, echocardiogram shows a preserved EF of 60% with chronic grade 2 diastolic dysfunction.  6. Anxiety. Continue xanax.   7. OSA. Continue CPAP.   8. HX of sacral decubitus ulcer. He was recently discharged from [facility] nursing home per son this ulcer has completely healed, we will continue to monitor.  9. Non specific CT findings of bladder stone, bladder thickening, bowel mass which appears chronic and benign. We'll defer this to PCP for monitoring with outpatient age-appropriate workup and follow-up.  10. ARF due to severe dehydration and sepsis. Improving with hydration. Continue to monitor.  11. Hypomagnesemia/Hypokalemia. Replaced both potassium and magnesium.  12. Borderline High QTC. Discussed with Dr. Diona BrownerMcDowell cardiologist, it's actually better than his baseline, a year ago while on sotalol his QTC was in 650 range. Sotalol will be commenced once he is taking oral medications.    Family Communication  :  Son  Code Status :  DNR  Diet : Diet full liquid Room service appropriate? Yes; Fluid consistency: Thin    Disposition Plan  :  Med Surg  Consults  :  Surgery, GI  Procedures  :    TTE - Mild LVH with LVEF 60-65%. Grade 2 diastolic dysfunction. Moderate biatrial enlargement. Mitral annular calcification with mild mitral regurgitation. Aortic valve appears sclerotic, not well visualized. Mildly dilated right ventricle with device wire present. Mild tricuspid regurgitation with RV-RA gradient 30 mmHg. No pericardial effusion.  CT Abd Pelvis - ? gastric outlet obstruction, small bowel mass.   DVT Prophylaxis  :   Heparin   Lab Results  Component Value Date   PLT 89 (L) 05/05/2016    Inpatient Medications  Scheduled Meds: . cefTRIAXone (ROCEPHIN)  IV  1 g Intravenous Q24H  . Chlorhexidine Gluconate Cloth  6 each Topical Q0600  . Chlorhexidine Gluconate Cloth  6 each Topical Q0600  . feeding supplement (PRO-STAT SUGAR FREE 64)   30 mL Oral TID WC  .  heparin  5,000 Units Subcutaneous Q8H  . [START ON 05/06/2016] hydrocortisone sod succinate (SOLU-CORTEF) inj  50 mg Intravenous Daily  . mupirocin ointment  1 application Nasal BID  . mupirocin ointment  1 application Nasal BID  . naphazoline-glycerin  1 drop Both Eyes TID  . sodium chloride flush  3 mL Intravenous Q12H  . sotalol  160 mg Oral BID   Continuous Infusions:  PRN Meds:.acetaminophen, ALPRAZolam, hydrALAZINE, HYDROcodone-acetaminophen, ondansetron (ZOFRAN) IV, promethazine, sodium chloride  Antibiotics  :    Anti-infectives    Start     Dose/Rate Route Frequency Ordered Stop   05/02/16 1300  vancomycin (VANCOCIN) 50 mg/mL oral solution 250 mg  Status:  Discontinued     250 mg Oral Every 6 hours 05/02/16 1248 05/04/16 2354   05/02/16 1200  cefTRIAXone (ROCEPHIN) 1 g in dextrose 5 % 50 mL IVPB     1 g 100 mL/hr over 30 Minutes Intravenous Every 24 hours 05/02/16 1104 05/07/16 1459   05/02/16 0900  piperacillin-tazobactam (ZOSYN) IVPB 3.375 g     3.375 g 100 mL/hr over 30 Minutes Intravenous  Once 05/02/16 0854 05/02/16 0932   05/02/16 0900  vancomycin (VANCOCIN) IVPB 1000 mg/200 mL premix     1,000 mg 200 mL/hr over 60 Minutes Intravenous  Once 05/02/16 0854 05/02/16 1001         Objective:   Vitals:   05/05/16 0500 05/05/16 0600 05/05/16 0700 05/05/16 0800  BP:  (!) 125/96  (!) 131/107  Pulse:  79 62 78  Resp:  18 18 12   Temp:      TempSrc:      SpO2:  100% 98% 100%  Weight: 68.2 kg (150 lb 5.7 oz)     Height:        Wt Readings from Last 3 Encounters:  05/05/16 68.2 kg (150 lb 5.7 oz)  05/15/15 63.8 kg (140 lb 10.5 oz)  05/09/15 87.5 kg (193 lb)     Intake/Output Summary (Last 24 hours) at 05/05/16 0927 Last data filed at 05/05/16 0500  Gross per 24 hour  Intake           1922.5 ml  Output             2925 ml  Net          -1002.5 ml     Physical Exam  Awake Alert, Oriented X 3, No new F.N deficits, Normal  affect Jon [name],PERRAL Supple Neck,No JVD, No cervical lymphadenopathy appriciated.  Symmetrical Chest wall movement, Good air movement bilaterally, CTAB RRR,No Gallops,Rubs or new Murmurs, No Parasternal Heave +ve B.Sounds, Abd Soft, No tenderness, No organomegaly appriciated, No rebound - guarding or rigidity. Colostomy in place with liquid stool, chronic indwelling Foley No Cyanosis, Clubbing or edema, No new Rash or bruise, bilateral cam boots    Data Review:    CBC  Recent Labs Lab 05/02/16 0842 05/03/16 0417 05/04/16 0847 05/05/16 0420  WBC 5.3 11.6* 13.9* 11.2*  HGB 10.7* 9.9* 10.6* 10.2*  HCT 32.5* 30.5* 32.4* 30.8*  PLT 65* 77* 99* 89*  MCV 91.8 93.3 91.0 91.4  MCH 30.2 30.3 29.8 30.3  MCHC 32.9 32.5 32.7 33.1  RDW 15.3 15.2 16.0* 15.8*  LYMPHSABS 0.4*  --   --   --   MONOABS 0.2  --   --   --   EOSABS 0.0  --   --   --   BASOSABS 0.0  --   --   --  Chemistries   Recent Labs Lab 05/02/16 0842 05/03/16 0417 05/04/16 0541 05/05/16 0420  NA 133* 137 140 139  K 3.4* 4.9 4.1 3.8  CL 107 115* 115* 113*  CO2 18* 17* 19* 20*  GLUCOSE 74 121* 116* 108*  BUN 45* 42* 44* 37*  CREATININE 1.95* 1.66* 1.28* 1.13  CALCIUM 7.7* 7.3* 7.9* 7.9*  MG 1.4*  --  2.0 1.8  AST 26  --   --   --   ALT 11*  --   --   --   ALKPHOS 42  --   --   --   BILITOT 1.0  --   --   --    ------------------------------------------------------------------------------------------------------------------ No results for input(s): CHOL, HDL, LDLCALC, TRIG, CHOLHDL, LDLDIRECT in the last 72 hours.  No results found for: HGBA1C ------------------------------------------------------------------------------------------------------------------ No results for input(s): TSH, T4TOTAL, T3FREE, THYROIDAB in the last 72 hours.  Invalid input(s): FREET3 ------------------------------------------------------------------------------------------------------------------ No results for input(s):  VITAMINB12, FOLATE, FERRITIN, TIBC, IRON, RETICCTPCT in the last 72 hours.  Coagulation profile  Recent Labs Lab 05/02/16 1600 05/04/16 0847  INR 2.01 2.91    No results for input(s): DDIMER in the last 72 hours.  Cardiac Enzymes  Recent Labs Lab 05/02/16 0842  TROPONINI 0.03*   ------------------------------------------------------------------------------------------------------------------ No results found for: BNP  Micro Results Recent Results (from the past 240 hour(s))  Urine culture     Status: Abnormal   Collection Time: 05/02/16  8:15 AM  Result Value Ref Range Status   Specimen Description URINE, CLEAN CATCH  Final   Special Requests NONE  Final   Culture MULTIPLE SPECIES PRESENT, SUGGEST RECOLLECTION (A)  Final   Report Status 05/03/2016 FINAL  Final  Gastrointestinal Panel by PCR , Stool     Status: None   Collection Time: 05/02/16  8:33 AM  Result Value Ref Range Status   Campylobacter species NOT DETECTED NOT DETECTED Final   Plesimonas shigelloides NOT DETECTED NOT DETECTED Final   Salmonella species NOT DETECTED NOT DETECTED Final   Yersinia enterocolitica NOT DETECTED NOT DETECTED Final   Vibrio species NOT DETECTED NOT DETECTED Final   Vibrio cholerae NOT DETECTED NOT DETECTED Final   Enteroaggregative E coli (EAEC) NOT DETECTED NOT DETECTED Final   Enteropathogenic E coli (EPEC) NOT DETECTED NOT DETECTED Final   Enterotoxigenic E coli (ETEC) NOT DETECTED NOT DETECTED Final   Shiga like toxin producing E coli (STEC) NOT DETECTED NOT DETECTED Final   Shigella/Enteroinvasive E coli (EIEC) NOT DETECTED NOT DETECTED Final   Cryptosporidium NOT DETECTED NOT DETECTED Final   Cyclospora cayetanensis NOT DETECTED NOT DETECTED Final   Entamoeba histolytica NOT DETECTED NOT DETECTED Final   Giardia lamblia NOT DETECTED NOT DETECTED Final   Adenovirus F40/41 NOT DETECTED NOT DETECTED Final   Astrovirus NOT DETECTED NOT DETECTED Final   Norovirus GI/GII NOT  DETECTED NOT DETECTED Final   Rotavirus A NOT DETECTED NOT DETECTED Final   Sapovirus (I, II, IV, and V) NOT DETECTED NOT DETECTED Final  C difficile quick scan w PCR reflex     Status: Abnormal   Collection Time: 05/02/16  8:33 AM  Result Value Ref Range Status   C Diff antigen POSITIVE (A) NEGATIVE Final   C Diff toxin NEGATIVE NEGATIVE Final   C Diff interpretation Results are indeterminate. See PCR results.  Final  Clostridium Difficile by PCR     Status: Abnormal   Collection Time: 05/02/16  8:33 AM  Result Value Ref Range Status  Toxigenic C Difficile by pcr POSITIVE (A) NEGATIVE Final    Comment: Positive for toxigenic C. difficile with little to no toxin production. Only treat if clinical presentation suggests symptomatic illness. Performed at [facility]   Blood Culture (routine x 2)     Status: None (Preliminary result)   Collection Time: 05/02/16  9:24 AM  Result Value Ref Range Status   Specimen Description BLOOD RIGHT HAND  Final   Special Requests BOTTLES DRAWN AEROBIC ONLY 5CC  Final   Culture NO GROWTH 3 DAYS  Final   Report Status PENDING  Incomplete  Blood Culture (routine x 2)     Status: None (Preliminary result)   Collection Time: 05/02/16  9:30 AM  Result Value Ref Range Status   Specimen Description BLOOD RIGHT ANTECUBITAL  Final   Special Requests BOTTLES DRAWN AEROBIC AND ANAEROBIC 8CC  Final   Culture NO GROWTH 3 DAYS  Final   Report Status PENDING  Incomplete  MRSA PCR Screening     Status: Abnormal   Collection Time: 05/02/16 12:50 PM  Result Value Ref Range Status   MRSA by PCR POSITIVE (A) NEGATIVE Final    Comment:        The GeneXpert MRSA Assay (FDA approved for NASAL specimens only), is one component of a comprehensive MRSA colonization surveillance program. It is not intended to diagnose MRSA infection nor to guide or monitor treatment for MRSA infections. RESULT CALLED TO, READ BACK BY AND VERIFIED WITH: ROSE,C ON 05/02/16 AT  2230 BY LOY,C     Radiology Reports Ct Abdomen Pelvis Wo Contrast  Result Date: 05/03/2016 CLINICAL DATA:  Abdominal pain, diarrhea, sepsis, low-grade fever. EXAM: CT ABDOMEN AND PELVIS WITHOUT CONTRAST TECHNIQUE: Multidetector CT imaging of the abdomen and pelvis was performed following the standard protocol without IV contrast. COMPARISON:  CT scan 05/02/2016 FINDINGS: Lower chest: Interval development of small bilateral pleural effusions and overlying bibasilar atelectasis. Hepatobiliary: No focal hepatic lesions or intrahepatic biliary dilatation. Numerous layering gallstones are again noted in the gallbladder. No common bile duct dilatation. Pancreas: No mass, inflammation or ductal dilatation. Mild pancreatic atrophy. Spleen: Normal size.  No focal lesions. Adrenals/Urinary Tract: Mild nodularity of both adrenal glands is stable. There are numerous bilateral simple and hemorrhagic cysts. No new lesions. Extensive renal artery calcifications and small renal calculi. No obstructing ureteral calculi or hydronephrosis. Numerous bladder calculi are again demonstrated. Thick walled bladder containing a Foley catheter. Stomach/Bowel: The stomach is moderately distended. There is a persistent 3 cm soft tissue mass along the anti mesenteric border of the descending duodenum. This could be a benign gist tumor. It could be causing partial gastric outlet obstruction. The duodenum, small bowel and colon are otherwise unremarkable. No inflammatory changes, mass lesions or obstructive findings. Stable left lower quadrant colostomy. No complicating features. Small parastomal hernia. Vascular/Lymphatic: Stable advanced atherosclerotic calcifications involving the aorta. No mesenteric or retroperitoneal mass or adenopathy. Reproductive: The prostate gland and seminal vesicles are grossly normal. Other: No pelvic mass or lymphadenopathy. Small amount of free pelvic fluid. No inguinal mass or adenopathy. Musculoskeletal:  Scarring changes from previous decubitus ulcers. I do not see an obvious ulcer or definite changes of osteomyelitis. Diffuse osteoporosis. IMPRESSION: 1. New small bilateral pleural effusions with overlying atelectasis. 2. Distended stomach suggesting gastroparesis or partial gastric outlet obstruction. There is a persistent 3 cm duodenal or periduodenal mass. 3. Numerous renal cysts are stable. Small renal calculi but no obstructing ureteral calculi. 4. Numerous stable bladder calculi  and diffusely thick walled bladder. 5. Cholelithiasis. 6. Left lower quadrant colostomy without complicating features. 7. Stable advanced atherosclerotic calcifications involving the aorta and branch vessels. Electronically Signed   By: Rudie MeyerP.  Gallerani M.D.   On: 05/03/2016 16:12   Ct Abdomen Pelvis Wo Contrast  Result Date: 05/02/2016 CLINICAL DATA:  Generalized abdominal pain and diarrhea for several days. EXAM: CT ABDOMEN AND PELVIS WITHOUT CONTRAST TECHNIQUE: Multidetector CT imaging of the abdomen and pelvis was performed following the standard protocol without IV contrast. COMPARISON:  06/15/2014 and chest CT on 05/06/2006 FINDINGS: Lower chest: No acute findings. Hepatobiliary: No masses visualized on this unenhanced exam. Multiple calcified gallstones are again seen, without evidence of acute cholecystitis or biliary ductal dilatation. Pancreas:  Phone Spleen:  Within normal limits in size. Adrenals/Urinary tract: Multiple simple and hemorrhagic renal cysts are again seen bilaterally which are stable. Largest cyst arising from the lateral lower pole the left kidney extends inferiorly into the right lower quadrant measures 15 cm. These remain stable. Numerous small 5 mm calculi are seen within the right kidney and renal collecting system including the renal pelvis. There is no evidence hydronephrosis. No evidence of ureteral calculi or dilatation. Foley catheter is seen in place with decompressed urinary bladder. Diffuse  bladder wall thickening is consistent with chronic bladder outlet obstruction. Multiple calculi are seen within the urinary bladder which have increased in size and number since previous study. Largest calculus measures 2.1 cm. Stomach/Bowel: No evidence of obstruction, inflammatory process, or abnormal fluid collections. Left lower quadrant colostomy noted. A right upper quadrant soft tissue mass is seen which abuts the proximal duodenum. This mass is homogeneous and shows mild peripheral calcification. This measures 2.8 x 3.2 cm on image 26/2 and shows minimal increase in size compared to previous chest CT dating back to 05/06/2006 when it measured 2.4 x 2.7 cm. Vascular/Lymphatic: No pathologically enlarged lymph nodes identified. No evidence of abdominal aortic aneurysm. Aortic atherosclerosis. Reproductive:  Mildly enlarged prostate gland. Other:  None. Musculoskeletal: No suspicious bone lesions identified. Advanced lumbar degenerative spondylosis and bilateral hip osteoarthritis noted. IMPRESSION: No acute findings within the abdomen or pelvis. Increased number and size of bladder calculi measuring up to 2.1 cm. Diffuse bladder wall thickening, consistent with chronic bladder outlet obstruction. Urinary bladder is decompressed by Foley catheter. Stable mildly enlarged prostate. Multiple tiny nonobstructive right renal calculi. No evidence of ureteral calculi or hydronephrosis. Stable bilateral renal cysts, largest in left kidney measuring 15 cm. Cholelithiasis.  No radiographic evidence of cholecystitis. Minimal increase in size of 3.2 cm right upper quadrant mass abutting the proximal duodenum compared to previous studies dating back to 2007. This is likely benign in etiology, and differential diagnosis includes leiomyoma and GI stromal tumor. Consider endoscopic ultrasound or continued attention on follow-up imaging. Electronically Signed   By: Myles RosenthalJohn  Stahl M.D.   On: 05/02/2016 10:43   Dg Chest Port 1  View  Result Date: 05/04/2016 CLINICAL DATA:  Shortness of Breath EXAM: PORTABLE CHEST 1 VIEW COMPARISON:  05/02/2016 FINDINGS: Small left pleural effusion with left base atelectasis. Heart is normal size. Left pacer remains in place, unchanged. Right lung is clear. Underlying COPD. IMPRESSION: Small left pleural effusion with left base atelectasis. COPD. Electronically Signed   By: Charlett NoseKevin  Dover M.D.   On: 05/04/2016 08:22   Dg Chest Port 1 View  Result Date: 05/02/2016 CLINICAL DATA:  Generalized weakness. EXAM: PORTABLE CHEST 1 VIEW COMPARISON:  05/15/2015 FINDINGS: Stable aortic tortuosity. The heart size is normal.  Stable appearance of dual-chamber pacemaker There is no evidence of pulmonary edema, consolidation, pneumothorax, nodule or pleural fluid. The visualized skeletal structures are unremarkable. IMPRESSION: No active disease. Electronically Signed   By: Irish Lack M.D.   On: 05/02/2016 09:09   Dg Abd Portable 1v  Result Date: 05/04/2016 CLINICAL DATA:  NG tube placement EXAM: PORTABLE ABDOMEN - 1 VIEW COMPARISON:  05/03/2016 FINDINGS: NG tube has been placed with the tip in the stomach. Decreasing gaseous distention of the stomach. No evidence for bowel obstruction, free air organomegaly. IMPRESSION: NG tube tip in the stomach which is decompressed. Electronically Signed   By: Charlett Nose M.D.   On: 05/04/2016 08:21   Dg Abd Portable 1v  Result Date: 05/03/2016 CLINICAL DATA:  Nausea and abdominal pain. EXAM: PORTABLE ABDOMEN - 1 VIEW COMPARISON:  05/02/2016 FINDINGS: Gaseous distention of the stomach. No significant small bowel gaseous distension. Degenerative changes in lower lumbar spine with scoliosis. Again noted is a Foley catheter. Multiple calcifications in the pelvis and some of these represent bladder calculi based on the prior CT. There is an ostomy in the left lower abdomen. Moderate joint space narrowing in the right hip. IMPRESSION: Gaseous distension of the stomach.  Foley catheter with bladder calcifications as described. Electronically Signed   By: Richarda Overlie M.D.   On: 05/03/2016 11:42    Time Spent in minutes  30   SINGH,PRASHANT K M.D on 05/05/2016 at 9:27 AM  Between 7am to 7pm - Pager - [phone]  After 7pm go to [email or web address] - password [facility]  [facility] -  Office  [phone]

## 2016-05-05 NOTE — Consult Note (Signed)
Referring Provider: No ref. provider found Primary Care Physician:  Jon ObeyStephen D Knowlton, MD Primary Gastroenterologist:  Jon [name]  Reason for Consultation: DISTENDED STOMACH, SMA; BOWEL TUMOR   Impression: ADMITTED WITH ACUTE ONSET NVD AND ABDOMINAL PAIN IN SETTING OF UROSEPSIS. CLINICALLY IMPROVED WITH ROCEPHIN. NO CHRONIC ISSUES WITH NAUSEA AND VOMIING   Plan: 1. ADVANCE DIET TO DYSPHAGIA 3. 2. CONTINUE TO MONITOR SYMPTOMS: DIARRHEA, NAUSEA, VOMITING. 3. SUPPORTIVE CARE 4. Complete therapy for UTI.     HPI:  HISTORY OBTAINED FROM PATIENT, EMR AND NURSING. RECORDS REVIEWED FROM 2012 TO PRESENT. LAST SEEN AND EVALUATED NOV 2016 FOR HEMATEMESIS IN NOV 2016-Jon [name] STOMACH AND GASTRITIS. PT REPORTS HE WAS DOING FINE UNTIL HE ATE MEAL/STEAK. HE SUBSEQUENTLY DEVELOPED NAUSEA AND VOMITING AND DIARRHEA. HE WAS BROUGHT TO THE ED AND FOUND TO BE HYPOTENSIVE AND FEBRILE WITH AKI AND LOW PLTS. UA SHOWED TNTC WBCs. PLACED ON ABX AND NOW NVD HAS RESOLVED.   PT DENIES CHILLS, HEMATOCHEZIA, HEMATEMESIS, melena, CHEST PAIN, SHORTNESS OF BREATH, CHANGE IN BOWEL IN HABITS, constipation, problems swallowing, OR heartburn or indigestion.    Past Medical History:  Diagnosis Date  . Abnormality of gait   . B12 deficiency   . Bilateral hydronephrosis   . Bladder calculi   . Bleeding ulcer   . Essential hypertension   . Essential tremor   . Macular degeneration of left eye   . Memory loss   . Multiple rib fractures   . Neuropathy (HCC)   . OSA (obstructive sleep apnea)    Does not use CPAP  . PAF (paroxysmal atrial fibrillation) (HCC)   . Tachycardia-bradycardia syndrome (HCC)    [employer] PPM - Dr. Royann [name]  . Varicose veins    Bilateral    Past Surgical History:  Procedure Laterality Date  . CATARACT EXTRACTION Bilateral 2013  . COLOSTOMY N/A 07/11/2014   Procedure: DIVERTING COLOSTOMY;  Surgeon: Jon HeadingMark A Jenkins, MD;  Location: [facility];  Service: General;  Laterality: N/A;  wound  class: clean contaminated  . ESOPHAGOGASTRODUODENOSCOPY N/A 05/16/2015   Procedure: ESOPHAGOGASTRODUODENOSCOPY (EGD);  Surgeon: Jon BaliSandi L Jasaun Carn, MD;  Location: [facility];  Service: Endoscopy;  Laterality: N/A;  . INCISION AND DRAINAGE OF WOUND N/A 07/11/2014   Procedure: DEBRIDEMENT OF SACRUM;  Surgeon: Jon HeadingMark A Jenkins, MD;  Location: [facility];  Service: General;  Laterality: N/A;  . MENINGIOMA REMOVAL  1992   POST RT FRONTAL   . PACEMAKER INSERTION  2006  . PERMANENT PACEMAKER GENERATOR CHANGE N/A 08/13/2012   Procedure: PERMANENT PACEMAKER GENERATOR CHANGE;  Surgeon: Jon FairMihai Croitoru, MD;  Location: [facility];  Service: Cardiovascular;  Laterality: N/A;    Prior to Admission medications   Medication Sig Start Date End Date Taking? Authorizing Provider  ALPRAZolam Prudy Feeler(XANAX) 1 MG tablet Take 0.5 tablets (0.5 mg total) by mouth 2 (two) times daily as needed for anxiety. Patient taking differently: Take 0.5 mg by mouth 5 (five) times daily as needed for anxiety.  06/18/14  Yes Hollice EspySendil K Krishnan, MD  diphenhydrAMINE (BENADRYL) 25 MG tablet Take 25 mg by mouth every 6 (six) hours as needed for itching.   Yes Historical Provider, MD  HYDROcodone-acetaminophen (NORCO) 10-325 MG tablet Take 1 tablet by mouth every 6 (six) hours as needed for moderate pain.  05/04/15  Yes Historical Provider, MD  lisinopril (PRINIVIL,ZESTRIL) 10 MG tablet Take 1 tablet by mouth daily. 03/16/16  Yes Historical Provider, MD  Multiple Vitamins-Minerals (ICAPS AREDS FORMULA PO) Take 1 tablet by mouth daily.  Yes Historical Provider, MD  nystatin (MYCOSTATIN/NYSTOP) powder Apply 1 application topically daily. 02/20/16  Yes Historical Provider, MD  oxyCODONE-acetaminophen (PERCOCET) 10-325 MG tablet Take 1 tablet by mouth every 6 (six) hours as needed for pain (for breakthrough pain).  02/28/16  Yes Historical Provider, MD  pantoprazole (PROTONIX) 40 MG tablet Take 1 tablet (40 mg total) by mouth 2 (two) times daily before a meal.  05/18/15  Yes Jon Blinks, MD  sotalol (BETAPACE) 160 MG tablet Take 1 tablet (160 mg total) by mouth 2 (two) times daily. Need appointment for future refills 06/06/15  Yes Jon Croitoru, MD  warfarin (COUMADIN) 2.5 MG tablet Take 1 tablet by mouth daily. 04/20/16  Yes Historical Provider, MD  Tetrahydrozoline HCl (EYE DROPS OP) Apply 2 drops to eye daily as needed (irritation).    Historical Provider, MD    Current Facility-Administered Medications  Medication Dose Route Frequency Provider Last Rate Last Dose  . acetaminophen (TYLENOL) tablet 650 mg  650 mg Oral Q6H PRN Jon Sea, MD      . ALPRAZolam Prudy Feeler) tablet 0.5 mg  0.5 mg Oral BID PRN Jon Sea, MD      . cefTRIAXone (ROCEPHIN) 1 g in dextrose 5 % 50 mL IVPB  1 g Intravenous Q24H Jon Sea, MD 100 mL/hr at 05/04/16 1439 1 g at 05/04/16 1439  . Chlorhexidine Gluconate Cloth 2 % PADS 6 each  6 each Topical Q0600 Jon Chang, NP   6 each at 05/05/16 0600  . Chlorhexidine Gluconate Cloth 2 % PADS 6 each  6 each Topical Q0600 Jon Sea, MD   6 each at 05/05/16 0600  . feeding supplement (PRO-STAT SUGAR FREE 64) liquid 30 mL  30 mL Oral TID WC Jon Sea, MD   30 mL at 05/03/16 0920  . heparin injection 5,000 Units  5,000 Units Subcutaneous Q8H Jon Sea, MD   5,000 Units at 05/05/16 [phone]  . HYDROcodone-acetaminophen (NORCO) 10-325 MG per tablet 1 tablet  1 tablet Oral Q6H PRN Jon Sea, MD      . hydrocortisone sodium succinate (SOLU-CORTEF) 100 MG injection 100 mg  100 mg Intravenous Q8H Jon Sea, MD   100 mg at 05/05/16 0332  . mupirocin ointment (BACTROBAN) 2 % 1 application  1 application Nasal BID Jon Chang, NP   1 application at 05/04/16 2338  . mupirocin ointment (BACTROBAN) 2 % 1 application  1 application Nasal BID Jon Sea, MD   1 application at 05/04/16 2338  . naphazoline-glycerin (CLEAR EYES) ophth solution 1 drop  1 drop Both Eyes TID Jon Sea, MD   1 drop at 05/04/16 2200  . ondansetron (ZOFRAN) injection 4 mg  4 mg Intravenous Q6H PRN Jon Sea, MD   4 mg at 05/03/16 1414  . promethazine (PHENERGAN) injection 12.5 mg  12.5 mg Intravenous Q8H PRN Jon Sea, MD   12.5 mg at 05/03/16 1040  . sodium chloride 0.9 % bolus 1,000 mL  1,000 mL Intravenous PRN Jon Sea, MD      . sodium chloride flush (NS) 0.9 % injection 3 mL  3 mL Intravenous Q12H Jon Sea, MD   3 mL at 05/04/16 2339  . sotalol (BETAPACE) tablet 160 mg  160 mg Oral BID Jon Sea, MD   160 mg at 05/04/16 2337    Allergies as of 05/02/2016 - Review Complete 05/02/2016  Allergen Reaction Noted  .  Iohexol  05/06/2006  . Shellfish allergy  09/22/2014  . Dilantin [phenytoin sodium extended] Rash 08/05/2012  . Tegretol [carbamazepine] Rash 08/05/2012   Family History  Problem Relation Age of Onset  . Aortic aneurysm Mother   . Parkinsonism Mother   . Heart disease Father   . Esophageal cancer Sister   . Colon cancer Neg Hx    Social History   Social History  . Marital status: Married    Spouse name: N/A  . Number of children: 2  . Years of education: COLLEGE   Occupational History  . RETIRED    Social History Main Topics  . Smoking status: Former Smoker    Packs/day: 0.75    Years: 25.00    Types: Cigarettes  . Smokeless tobacco: Never Used     Comment: QUIT 35 YRS AGO  . Alcohol use No  . Drug use: No  . Sexual activity: No   Other Topics Concern  . Not on file   Social History Narrative  . No narrative on file    Review of Systems: PER HPI OTHERWISE ALL SYSTEMS ARE NEGATIVE.   Vitals: Blood pressure (!) 131/107, pulse 78, temperature 97 F (36.1 C), temperature source Oral, resp. rate 12, height 5\' 10"  (1.778 m), weight 150 lb 5.7 oz (68.2 kg), SpO2 100 %.  Physical Exam: General:   Alert, pleasant and cooperative in NAD Head:  Normocephalic and atraumatic. Eyes:  Sclera clear, no icterus.    Conjunctiva pink. Mouth:  No lesions, POOR dentition. Neck:  Supple; no masses. Lungs:  Clear throughout to auscultation.   No wheezes. No acute distress. Heart:  Regular rate and IRREGULAR rhythm; no murmurs Abdomen:  Soft, nontender and nondistended. No masses noted. Ostomy in LLQ and brown stool in bag, normal bowel sounds, without guarding, and without rebound.   Msk:  Symmetrical. Extremities:  Without edema. Neurologic:  Alert and  oriented x4;  NO  NEW FOCAL DEFICITS Psych:  Alert and cooperative. Normal mood and flat affect. Good eye contact   Lab Results:  Recent Labs  05/03/16 0417 05/04/16 0847 05/05/16 0420  WBC 11.6* 13.9* 11.2*  HGB 9.9* 10.6* 10.2*  HCT 30.5* 32.4* 30.8*  PLT 77* 99* 89*   BMET  Recent Labs  05/04/16 0541 05/05/16 0420  NA 140 139  K 4.1 3.8  CL 115* 113*  CO2 19* 20*  GLUCOSE 116* 108*  BUN 44* 37*  CREATININE 1.28* 1.13  CALCIUM 7.9* 7.9*   LFT  NOV 16 AST 26 ALT 11 T bili 1.0 Cr 1.95 C DIFF Ag NEG, TOXIN POS, GI PATH PANEL NEGATIVE  URINE CULTURE: NG x 3 DAYS  Studies/Results: CT ABD/PELVIS NOV 17: STABLE MASS IN MESENTERY, DILATED STOMACH   LOS: 3 days   Jon [name]  05/05/2016, 9:18 AM

## 2016-05-05 NOTE — Progress Notes (Signed)
Pt is in no obvious or stated distress and has rested (at will) for the entire 12 hr shift. No Nausea No vomiting. No use of anti-emetic. Pt has ingested fluids and medications with no difficulty.

## 2016-05-06 ENCOUNTER — Telehealth: Payer: Self-pay | Admitting: Gastroenterology

## 2016-05-06 ENCOUNTER — Encounter: Payer: Self-pay | Admitting: Gastroenterology

## 2016-05-06 MED ORDER — PRO-STAT SUGAR FREE PO LIQD: 30.0000 mL | Freq: Three times a day (TID) | ORAL | 0 refills | Status: DC

## 2016-05-06 MED ORDER — CEFPODOXIME PROXETIL 200 MG PO TABS
200.0000 mg | ORAL_TABLET | Freq: Two times a day (BID) | ORAL | Status: DC
  Filled 2016-05-06 (×5): qty 1

## 2016-05-06 MED ORDER — WARFARIN SODIUM 2.5 MG PO TABS: 2.5000 mg | ORAL_TABLET | Freq: Every day | ORAL | Status: DC

## 2016-05-06 MED ORDER — CEFPODOXIME PROXETIL 200 MG PO TABS: 200.0000 mg | ORAL_TABLET | Freq: Two times a day (BID) | ORAL | 0 refills | Status: DC

## 2016-05-06 MED ORDER — FUROSEMIDE 40 MG PO TABS
40.0000 mg | ORAL_TABLET | Freq: Once | ORAL | Status: AC
  Administered 2016-05-06: 40 mg via ORAL
  Filled 2016-05-06: qty 1

## 2016-05-06 NOTE — Telephone Encounter (Signed)
APPT MADE AND LETTER SENT  °

## 2016-05-06 NOTE — Discharge Instructions (Signed)
Follow with Primary MD Milana ObeyStephen D Knowlton, MD in 2-3 days   Get CBC, CMP, INR, 2 view Chest X ray checked  by Primary MD in 2-3 days ( we routinely change or add medications that can affect your baseline labs and fluid status, therefore we recommend that you get the mentioned basic workup next visit with your PCP, your PCP may decide not to get them or add new tests based on their clinical decision)   Activity: As tolerated with Full fall precautions use walker/cane & assistance as needed   Disposition Home     Diet:   DIET DYS with feeding assistance and aspiration precautions.  For Heart failure patients - Check your Weight same time everyday, if you gain over 2 pounds, or you develop in leg swelling, experience more shortness of breath or chest pain, call your Primary MD immediately. Follow Cardiac Low Salt Diet and 1.5 lit/day fluid restriction.   On your next visit with your primary care physician please Get Medicines reviewed and adjusted.   Please request your Prim.MD to go over all Hospital Tests and Procedure/Radiological results at the follow up, please get all Hospital records sent to your Prim MD by signing hospital release before you go home.   If you experience worsening of your admission symptoms, develop shortness of breath, life threatening emergency, suicidal or homicidal thoughts you must seek medical attention immediately by calling 911 or calling your MD immediately  if symptoms less severe.  You Must read complete instructions/literature along with all the possible adverse reactions/side effects for all the Medicines you take and that have been prescribed to you. Take any new Medicines after you have completely understood and accpet all the possible adverse reactions/side effects.   Do not drive, operate heavy machinery, perform activities at heights, swimming or participation in water activities or provide baby sitting services if your were admitted for syncope or  siezures until you have seen by Primary MD or a Neurologist and advised to do so again.  Do not drive when taking Pain medications.    Do not take more than prescribed Pain, Sleep and Anxiety Medications  Special Instructions: If you have smoked or chewed Tobacco  in the last 2 yrs please stop smoking, stop any regular Alcohol  and or any Recreational drug use.  Wear Seat belts while driving.   Please note  You were cared for by a hospitalist during your hospital stay. If you have any questions about your discharge medications or the care you received while you were in the hospital after you are discharged, you can call the unit and asked to speak with the hospitalist on call if the hospitalist that took care of you is not available. Once you are discharged, your primary care physician will handle any further medical issues. Please note that NO REFILLS for any discharge medications will be authorized once you are discharged, as it is imperative that you return to your primary care physician (or establish a relationship with a primary care physician if you do not have one) for your aftercare needs so that they can reassess your need for medications and monitor your lab values.

## 2016-05-06 NOTE — Care Management Note (Signed)
Case Management Note  Patient Details  Name: Jon [name] MRN: [id number] Date of Birth: Mar 18, 1932    Expected Discharge Date:       05/06/2016           Expected Discharge Plan:  Home w Home Health Services  In-House Referral:  Clinical Social Work  Discharge planning Services  CM Consult  Post Acute Care Choice:  Home Health, Resumption of Svcs/PTA Provider Choice offered to:  Patient  DME Arranged:    DME Agency:     HH Arranged:    HH Agency:     Status of Service:  Completed, signed off  If discussed at [employer] Meetings, dates discussed:    Additional Comments[facility] transport for 1230.  Gwendola Hornaday, [name], RN 05/06/2016, 11:44 AM

## 2016-05-06 NOTE — Discharge Summary (Signed)
Jon [name] ZOX:[id number] DOB: 1932-03-26 DOA: 05/02/2016  PCP: Milana Obey, MD  Admit date: 05/02/2016  Discharge date: 05/06/2016  Admitted From: Home  Disposition:  Home   Recommendations for Outpatient Follow-up:   Follow up with PCP in 1-2 weeks  PCP Please obtain BMP/CBC, INR, 2 view CXR in 1week,  (see Discharge instructions)   PCP Please follow up on the following pending results: Monitor INR closely   Home Health: PT,RN,SLP,Aide, S Worker   Equipment/Devices: None Consultations: GI, Surgery Discharge Condition: Fair   CODE STATUS: DNR   Diet Recommendation: DIET DYS 3 with feeding assistance and aspiration precautions   Chief Complaint  Patient presents with  . Hypotension  . Diarrhea  . Abdominal Pain     Brief history of present illness from the day of admission and additional interim summary     Jon [name] a 82 y.o.male,with a past medical history significant for PAF no anticoagulation due to fall risk, neuropathy, OSA, HTN, anxiety, s/p diverting colostomy & indwelling Foley due to Sacral Ulcer, who is bed bound of two years nowpresents from home viaEMS with generalized weakness and diarrhea that startedyesterday.   Patient was found weak and covered in feces by the EMS. He wascovered in stool. He does have a mild generalized headache but denies any other symptoms. While in the ED, he was found to be septic with a low-grade fever,mild hypokalemia, severely low blood pressure, with an elevated lactic acid. He has been started on empirical antibiotics and admitted for further evaluation of sepsis due to UTI +/- Cdiff.   After admission to the hospital on 05/03/2016 he developed her sister nausea vomiting with imaging consistent with gastric outlet obstruction. NG  tube has been placed and surgery has been consulted. He doesn't history of a small bowel mass on CT scan which appears to have been present for at least a few years.  Hospital issues addressed     1. Sepsis secondary to UTI from indwelling Foley. On admission, patient was noted to have a low grade fevers withsevere hypotension, mild hypokalemia, andan elevated lactic acid. Urinalysis was indicative of infection. Chest this admission were inconclusive but he has responded very well to Rocephin, Rocephin was selected based on Previous urine culture and sensitivity data. Note Foley catheter was changed 3 days prior to admission by home health staff.   This is now completely resolved, his blood pressures are stable, has been taken off of stress dose steroids and IV fluids, will be transitioned to 4 more days of oral Vantin and discharged home as per his wishes, sun bedside agrees with the plan.  2. Diarrhea. He does have diarrhea in his colostomy bag, his C. Diff panel was inconclusive, was seen by GI this admission who were not convinced this is an active infection, continue supportive care .   3. Abdominal pain, persistent nausea vomiting 05/03/2016. 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 He did eat some steak on 05/03/2016 and a heavy breakfast after which his symptoms arose. NG tube was placed and stomach appears to have decompressed on 05/04/2016, massive return noted from stomach was noted. He was seen by general surgery and GI and it was thought that he had gastroparesis, his small bowel mass was not obstructing the gastric outflow. He is now tolerating dysphagia 3 diet which will be continued upon discharge with outpatient GI follow-up.   4. Paroxysmal atrial fibrillation - history of tachybradycardia syndrome status post pacemaker placement. [place] VASC2score of  at least 3. Patient is on Coumadin which will be continued, off PCP to follow INR and Coumadin dose. EKG shows atrial-paced rhythm. Continue sotalol at home dose.  5. Essential HTN. Pressures are severely low, upon admission due to sepsis, sepsis physiology is resolved and blood pressures have stabilized.  6. Anxiety. Continue xanax.   7. HXof sacral decubitus ulcer. He was recently discharged from [facility] per son this ulcer has completely healed, we will continue to monitor.  8. Non specific CT findings of bladder stone, bladder thickening, bowel mass which appears chronic and benign. We'll defer this to PCP for monitoring with outpatient age-appropriate workup and follow-up.  9. ARF due to severe dehydration and sepsis. Resolved with hydration.  10. Hypomagnesemia/Hypokalemia. Replaced both potassium and magnesium.  11. Borderline High QTC. Discussed with Dr. Diona BrownerMcDowell cardiologist, it's actually better than his baseline, a year ago while on sotalol his QTC was in 650 range. Sotalol will be commenced once he is taking oral medications.   Discharge diagnosis     Principal Problem:   Sepsis (HCC) Active Problems:   Atrial fibrillation (HCC)   Lower urinary tract infectious disease   Acute kidney injury (HCC)   Bilateral hydronephrosis   S/P placement of cardiac pacemaker   Paroxysmal atrial fibrillation (HCC)   Tachycardia-bradycardia syndrome (HCC)   Diarrhea    Discharge instructions    Discharge Instructions    Discharge instructions    Complete by:  As directed    Follow with Primary MD Milana ObeyStephen D Knowlton, MD in 2-3 days   Get CBC, CMP, INR, 2 view Chest X ray checked  by Primary MD in 2-3 days ( we routinely change or add medications that can affect your baseline labs and fluid status, therefore we recommend that you get the mentioned basic workup next visit with your PCP, your PCP may decide not to get them or add new tests based on their clinical  decision)   Activity: As tolerated with Full fall precautions use walker/cane & assistance as needed   Disposition Home     Diet:   DIET DYS with feeding assistance and aspiration precautions.  For Heart failure patients - Check your Weight same time everyday, if you gain over 2 pounds, or you develop in leg swelling, experience more shortness of breath or chest pain, call your Primary MD immediately. Follow Cardiac Low Salt Diet and 1.5 lit/day fluid restriction.   On your next visit with your primary care physician please Get Medicines reviewed and adjusted.   Please request your Prim.MD to go over all Hospital Tests and Procedure/Radiological results at the follow up, please get all Hospital records sent to your Prim MD by signing hospital release before you go home.   If you experience worsening of your admission symptoms, develop shortness of breath, life threatening emergency, suicidal or homicidal thoughts you must seek medical attention immediately by calling 911 or  calling your MD immediately  if symptoms less severe.  You Must read complete instructions/literature along with all the possible adverse reactions/side effects for all the Medicines you take and that have been prescribed to you. Take any new Medicines after you have completely understood and accpet all the possible adverse reactions/side effects.   Do not drive, operate heavy machinery, perform activities at heights, swimming or participation in water activities or provide baby sitting services if your were admitted for syncope or siezures until you have seen by Primary MD or a Neurologist and advised to do so again.  Do not drive when taking Pain medications.    Do not take more than prescribed Pain, Sleep and Anxiety Medications  Special Instructions: If you have smoked or chewed Tobacco  in the last 2 yrs please stop smoking, stop any regular Alcohol  and or any Recreational drug use.  Wear Seat belts while  driving.   Please note  You were cared for by a hospitalist during your hospital stay. If you have any questions about your discharge medications or the care you received while you were in the hospital after you are discharged, you can call the unit and asked to speak with the hospitalist on call if the hospitalist that took care of you is not available. Once you are discharged, your primary care physician will handle any further medical issues. Please note that NO REFILLS for any discharge medications will be authorized once you are discharged, as it is imperative that you return to your primary care physician (or establish a relationship with a primary care physician if you do not have one) for your aftercare needs so that they can reassess your need for medications and monitor your lab values.   Increase activity slowly    Complete by:  As directed       Discharge Medications     Medication List    STOP taking these medications   lisinopril 10 MG tablet Commonly known as:  PRINIVIL,ZESTRIL   oxyCODONE-acetaminophen 10-325 MG tablet Commonly known as:  PERCOCET     TAKE these medications   ALPRAZolam 1 MG tablet Commonly known as:  XANAX Take 0.5 tablets (0.5 mg total) by mouth 2 (two) times daily as needed for anxiety. What changed:  when to take this   cefpodoxime 200 MG tablet Commonly known as:  VANTIN Take 1 tablet (200 mg total) by mouth every 12 (twelve) hours.   diphenhydrAMINE 25 MG tablet Commonly known as:  BENADRYL Take 25 mg by mouth every 6 (six) hours as needed for itching.   EYE DROPS OP Apply 2 drops to eye daily as needed (irritation).   feeding supplement (PRO-STAT SUGAR FREE 64) Liqd Take 30 mLs by mouth 3 (three) times daily with meals.   HYDROcodone-acetaminophen 10-325 MG tablet Commonly known as:  NORCO Take 1 tablet by mouth every 6 (six) hours as needed for moderate pain.   ICAPS AREDS FORMULA PO Take 1 tablet by mouth daily.   nystatin  powder Commonly known as:  MYCOSTATIN/NYSTOP Apply 1 application topically daily.   pantoprazole 40 MG tablet Commonly known as:  PROTONIX Take 1 tablet (40 mg total) by mouth 2 (two) times daily before a meal.   sotalol 160 MG tablet Commonly known as:  BETAPACE Take 1 tablet (160 mg total) by mouth 2 (two) times daily. Need appointment for future refills   warfarin 2.5 MG tablet Commonly known as:  COUMADIN Take 1 tablet (2.5 mg total)  by mouth daily. Start taking on:  05/07/2016       Follow-up Information    [facility] Follow up.   Contact information: [address] [place] [address] [phone]        Milana Obey, MD. Schedule an appointment as soon as possible for a visit in 1 week(s).   Specialty:  Family Medicine Contact information: [address] [place] [address] [phone]        Jonette Eva, MD. Schedule an appointment as soon as possible for a visit in 1 week(s).   Specialty:  Gastroenterology Contact information: [address] [place] [address] [phone]           Major procedures and Radiology Reports - PLEASE review detailed and final reports thoroughly  -     TTE - Mild LVH with LVEF 60-65%. Grade 2 diastolic dysfunction.Moderate biatrial enlargement. Mitral annular calcification withmild mitral regurgitation. Aortic valve appears sclerotic, notwell visualized. Mildly dilated right ventricle with device wirepresent. Mild tricuspid regurgitation with RV-RA gradient . No pericardial effusion.  CT Abd Pelvis - ? gastric outlet obstruction, small bowel mass.   Ct Abdomen Pelvis Wo Contrast  Result Date: 05/03/2016 CLINICAL DATA:  Abdominal pain, diarrhea, sepsis, low-grade fever. EXAM: CT ABDOMEN AND PELVIS WITHOUT CONTRAST TECHNIQUE: Multidetector CT imaging of the abdomen and pelvis was performed following the standard protocol without IV contrast. COMPARISON:  CT scan  05/02/2016 FINDINGS: Lower chest: Interval development of small bilateral pleural effusions and overlying bibasilar atelectasis. Hepatobiliary: No focal hepatic lesions or intrahepatic biliary dilatation. Numerous layering gallstones are again noted in the gallbladder. No common bile duct dilatation. Pancreas: No mass, inflammation or ductal dilatation. Mild pancreatic atrophy. Spleen: Normal size.  No focal lesions. Adrenals/Urinary Tract: Mild nodularity of both adrenal glands is stable. There are numerous bilateral simple and hemorrhagic cysts. No new lesions. Extensive renal artery calcifications and small renal calculi. No obstructing ureteral calculi or hydronephrosis. Numerous bladder calculi are again demonstrated. Thick walled bladder containing a Foley catheter. Stomach/Bowel: The stomach is moderately distended. There is a persistent 3 cm soft tissue mass along the anti mesenteric border of the descending duodenum. This could be a benign gist tumor. It could be causing partial gastric outlet obstruction. The duodenum, small bowel and colon are otherwise unremarkable. No inflammatory changes, mass lesions or obstructive findings. Stable left lower quadrant colostomy. No complicating features. Small parastomal hernia. Vascular/Lymphatic: Stable advanced atherosclerotic calcifications involving the aorta. No mesenteric or retroperitoneal mass or adenopathy. Reproductive: The prostate gland and seminal vesicles are grossly normal. Other: No pelvic mass or lymphadenopathy. Small amount of free pelvic fluid. No inguinal mass or adenopathy. Musculoskeletal: Scarring changes from previous decubitus ulcers. I do not see an obvious ulcer or definite changes of osteomyelitis. Diffuse osteoporosis. IMPRESSION: 1. New small bilateral pleural effusions with overlying atelectasis. 2. Distended stomach suggesting gastroparesis or partial gastric outlet obstruction. There is a persistent 3 cm duodenal or periduodenal  mass. 3. Numerous renal cysts are stable. Small renal calculi but no obstructing ureteral calculi. 4. Numerous stable bladder calculi and diffusely thick walled bladder. 5. Cholelithiasis. 6. Left lower quadrant colostomy without complicating features. 7. Stable advanced atherosclerotic calcifications involving the aorta and branch vessels. Electronically Signed   By: Rudie Meyer M.D.   On: 05/03/2016 16:12   Ct Abdomen Pelvis Wo Contrast  Result Date: 05/02/2016 CLINICAL DATA:  Generalized abdominal pain and diarrhea for several days. EXAM: CT ABDOMEN AND PELVIS WITHOUT CONTRAST TECHNIQUE:  Multidetector CT imaging of the abdomen and pelvis was performed following the standard protocol without IV contrast. COMPARISON:  06/15/2014 and chest CT on 05/06/2006 FINDINGS: Lower chest: No acute findings. Hepatobiliary: No masses visualized on this unenhanced exam. Multiple calcified gallstones are again seen, without evidence of acute cholecystitis or biliary ductal dilatation. Pancreas:  Phone Spleen:  Within normal limits in size. Adrenals/Urinary tract: Multiple simple and hemorrhagic renal cysts are again seen bilaterally which are stable. Largest cyst arising from the lateral lower pole the left kidney extends inferiorly into the right lower quadrant measures 15 cm. These remain stable. Numerous small 5 mm calculi are seen within the right kidney and renal collecting system including the renal pelvis. There is no evidence hydronephrosis. No evidence of ureteral calculi or dilatation. Foley catheter is seen in place with decompressed urinary bladder. Diffuse bladder wall thickening is consistent with chronic bladder outlet obstruction. Multiple calculi are seen within the urinary bladder which have increased in size and number since previous study. Largest calculus measures 2.1 cm. Stomach/Bowel: No evidence of obstruction, inflammatory process, or abnormal fluid collections. Left lower quadrant colostomy noted.  A right upper quadrant soft tissue mass is seen which abuts the proximal duodenum. This mass is homogeneous and shows mild peripheral calcification. This measures 2.8 x 3.2 cm on image 26/2 and shows minimal increase in size compared to previous chest CT dating back to 05/06/2006 when it measured 2.4 x 2.7 cm. Vascular/Lymphatic: No pathologically enlarged lymph nodes identified. No evidence of abdominal aortic aneurysm. Aortic atherosclerosis. Reproductive:  Mildly enlarged prostate gland. Other:  None. Musculoskeletal: No suspicious bone lesions identified. Advanced lumbar degenerative spondylosis and bilateral hip osteoarthritis noted. IMPRESSION: No acute findings within the abdomen or pelvis. Increased number and size of bladder calculi measuring up to 2.1 cm. Diffuse bladder wall thickening, consistent with chronic bladder outlet obstruction. Urinary bladder is decompressed by Foley catheter. Stable mildly enlarged prostate. Multiple tiny nonobstructive right renal calculi. No evidence of ureteral calculi or hydronephrosis. Stable bilateral renal cysts, largest in left kidney measuring 15 cm. Cholelithiasis.  No radiographic evidence of cholecystitis. Minimal increase in size of 3.2 cm right upper quadrant mass abutting the proximal duodenum compared to previous studies dating back to 2007. This is likely benign in etiology, and differential diagnosis includes leiomyoma and GI stromal tumor. Consider endoscopic ultrasound or continued attention on follow-up imaging. Electronically Signed   By: Myles Rosenthal M.D.   On: 05/02/2016 10:43   Dg Chest Port 1 View  Result Date: 05/04/2016 CLINICAL DATA:  Shortness of Breath EXAM: PORTABLE CHEST 1 VIEW COMPARISON:  05/02/2016 FINDINGS: Small left pleural effusion with left base atelectasis. Heart is normal size. Left pacer remains in place, unchanged. Right lung is clear. Underlying COPD. IMPRESSION: Small left pleural effusion with left base atelectasis. COPD.  Electronically Signed   By: Charlett Nose M.D.   On: 05/04/2016 08:22   Dg Chest Port 1 View  Result Date: 05/02/2016 CLINICAL DATA:  Generalized weakness. EXAM: PORTABLE CHEST 1 VIEW COMPARISON:  05/15/2015 FINDINGS: Stable aortic tortuosity. The heart size is normal. Stable appearance of dual-chamber pacemaker There is no evidence of pulmonary edema, consolidation, pneumothorax, nodule or pleural fluid. The visualized skeletal structures are unremarkable. IMPRESSION: No active disease. Electronically Signed   By: Irish Lack M.D.   On: 05/02/2016 09:09   Dg Abd Portable 1v  Result Date: 05/04/2016 CLINICAL DATA:  NG tube placement EXAM: PORTABLE ABDOMEN - 1 VIEW COMPARISON:  05/03/2016 FINDINGS: NG tube  has been placed with the tip in the stomach. Decreasing gaseous distention of the stomach. No evidence for bowel obstruction, free air organomegaly. IMPRESSION: NG tube tip in the stomach which is decompressed. Electronically Signed   By: Charlett Nose M.D.   On: 05/04/2016 08:21   Dg Abd Portable 1v  Result Date: 05/03/2016 CLINICAL DATA:  Nausea and abdominal pain. EXAM: PORTABLE ABDOMEN - 1 VIEW COMPARISON:  05/02/2016 FINDINGS: Gaseous distention of the stomach. No significant small bowel gaseous distension. Degenerative changes in lower lumbar spine with scoliosis. Again noted is a Foley catheter. Multiple calcifications in the pelvis and some of these represent bladder calculi based on the prior CT. There is an ostomy in the left lower abdomen. Moderate joint space narrowing in the right hip. IMPRESSION: Gaseous distension of the stomach. Foley catheter with bladder calcifications as described. Electronically Signed   By: Richarda Overlie M.D.   On: 05/03/2016 11:42    Micro Results    Recent Results (from the past 240 hour(s))  Urine culture     Status: Abnormal   Collection Time: 05/02/16  8:15 AM  Result Value Ref Range Status   Specimen Description URINE, CLEAN CATCH  Final   Special  Requests NONE  Final   Culture MULTIPLE SPECIES PRESENT, SUGGEST RECOLLECTION (A)  Final   Report Status 05/03/2016 FINAL  Final  Gastrointestinal Panel by PCR , Stool     Status: None   Collection Time: 05/02/16  8:33 AM  Result Value Ref Range Status   Campylobacter species NOT DETECTED NOT DETECTED Final   Plesimonas shigelloides NOT DETECTED NOT DETECTED Final   Salmonella species NOT DETECTED NOT DETECTED Final   Yersinia enterocolitica NOT DETECTED NOT DETECTED Final   Vibrio species NOT DETECTED NOT DETECTED Final   Vibrio cholerae NOT DETECTED NOT DETECTED Final   Enteroaggregative E coli (EAEC) NOT DETECTED NOT DETECTED Final   Enteropathogenic E coli (EPEC) NOT DETECTED NOT DETECTED Final   Enterotoxigenic E coli (ETEC) NOT DETECTED NOT DETECTED Final   Shiga like toxin producing E coli (STEC) NOT DETECTED NOT DETECTED Final   Shigella/Enteroinvasive E coli (EIEC) NOT DETECTED NOT DETECTED Final   Cryptosporidium NOT DETECTED NOT DETECTED Final   Cyclospora cayetanensis NOT DETECTED NOT DETECTED Final   Entamoeba histolytica NOT DETECTED NOT DETECTED Final   Giardia lamblia NOT DETECTED NOT DETECTED Final   Adenovirus F40/41 NOT DETECTED NOT DETECTED Final   Astrovirus NOT DETECTED NOT DETECTED Final   Norovirus GI/GII NOT DETECTED NOT DETECTED Final   Rotavirus A NOT DETECTED NOT DETECTED Final   Sapovirus (I, II, IV, and V) NOT DETECTED NOT DETECTED Final  C difficile quick scan w PCR reflex     Status: Abnormal   Collection Time: 05/02/16  8:33 AM  Result Value Ref Range Status   C Diff antigen POSITIVE (A) NEGATIVE Final   C Diff toxin NEGATIVE NEGATIVE Final   C Diff interpretation Results are indeterminate. See PCR results.  Final  Clostridium Difficile by PCR     Status: Abnormal   Collection Time: 05/02/16  8:33 AM  Result Value Ref Range Status   Toxigenic C Difficile by pcr POSITIVE (A) NEGATIVE Final    Comment: Positive for toxigenic C. difficile with little  to no toxin production. Only treat if clinical presentation suggests symptomatic illness. Performed at [facility]   Blood Culture (routine x 2)     Status: None (Preliminary result)   Collection Time: 05/02/16  9:24 AM  Result Value Ref Range Status   Specimen Description BLOOD RIGHT HAND  Final   Special Requests BOTTLES DRAWN AEROBIC ONLY 5CC  Final   Culture NO GROWTH 3 DAYS  Final   Report Status PENDING  Incomplete  Blood Culture (routine x 2)     Status: None (Preliminary result)   Collection Time: 05/02/16  9:30 AM  Result Value Ref Range Status   Specimen Description BLOOD RIGHT ANTECUBITAL  Final   Special Requests BOTTLES DRAWN AEROBIC AND ANAEROBIC 8CC  Final   Culture NO GROWTH 3 DAYS  Final   Report Status PENDING  Incomplete  MRSA PCR Screening     Status: Abnormal   Collection Time: 05/02/16 12:50 PM  Result Value Ref Range Status   MRSA by PCR POSITIVE (A) NEGATIVE Final    Comment:        The GeneXpert MRSA Assay (FDA approved for NASAL specimens only), is one component of a comprehensive MRSA colonization surveillance program. It is not intended to diagnose MRSA infection nor to guide or monitor treatment for MRSA infections. RESULT CALLED TO, READ BACK BY AND VERIFIED WITH: ROSE,C ON 05/02/16 AT 2230 BY LOY,C     Today   Subjective    Jon [name] today has no headache,no chest abdominal pain,no new weakness tingling or numbness, feels much better wants to go home today.     Objective   Blood pressure 128/81, pulse 63, temperature 97.3 F (36.3 C), temperature source Oral, resp. rate 18, height 5\' 10"  (1.778 m), weight 68.2 kg (150 lb 5.7 oz), SpO2 100 %.   Intake/Output Summary (Last 24 hours) at 05/06/16 0842 Last data filed at 05/05/16 2227  Gross per 24 hour  Intake              720 ml  Output              400 ml  Net              320 ml    Exam Awake Alert, Oriented x 3, No new F.N deficits, Normal  affect [place].AT,PERRAL Supple Neck,No JVD, No cervical lymphadenopathy appriciated.  Symmetrical Chest wall movement, Good air movement bilaterally, CTAB RRR,No Gallops,Rubs or new Murmurs, No Parasternal Heave +ve B.Sounds, Abd Soft, Non tender, No organomegaly appriciated, No rebound -guarding or rigidity. Colostomy in place with liquid stool, chronic indwelling Foley No Cyanosis, Clubbing or edema, No new Rash or bruise, bilateral cam boots   Data Review   CBC w Diff:  Lab Results  Component Value Date   WBC 11.2 (H) 05/05/2016   HGB 10.2 (L) 05/05/2016   HCT 30.8 (L) 05/05/2016   PLT 89 (L) 05/05/2016   LYMPHOPCT 7 05/02/2016   MONOPCT 3 05/02/2016   EOSPCT 0 05/02/2016   BASOPCT 0 05/02/2016    CMP:  Lab Results  Component Value Date   NA 139 05/05/2016   K 3.8 05/05/2016   CL 113 (H) 05/05/2016   CO2 20 (L) 05/05/2016   BUN 37 (H) 05/05/2016   CREATININE 1.13 05/05/2016   PROT 4.4 (L) 05/02/2016   ALBUMIN 2.4 (L) 05/02/2016   BILITOT 1.0 05/02/2016   ALKPHOS 42 05/02/2016   AST 26 05/02/2016   ALT 11 (L) 05/02/2016  .   Total Time in preparing paper work, data evaluation and todays exam - 35 minutes  Leroy SeaSINGH,Madalin Hughart K M.D on 05/06/2016 at 8:42 AM  [facility]   Office  [phone]

## 2016-05-06 NOTE — Telephone Encounter (Signed)
Please have patient follow-up with Dr. Darrick PennaFields in 6 weeks, hospital follow-up

## 2016-05-07 LAB — CULTURE, BLOOD (ROUTINE X 2)
Culture: NO GROWTH
Culture: NO GROWTH

## 2016-05-07 NOTE — Progress Notes (Signed)
Discharge instructions given to patient's son who verbalized understanding, patient out in stable condition via [facility] to home.

## 2016-05-10 ENCOUNTER — Emergency Department (HOSPITAL_COMMUNITY): Payer: Medicare Other

## 2016-05-10 ENCOUNTER — Inpatient Hospital Stay (HOSPITAL_COMMUNITY)
Admission: EM | Admit: 2016-05-10 | Discharge: 2016-05-13 | DRG: 150 | Disposition: A | Discharge status: Home Health | Payer: Medicare Other | Attending: Internal Medicine | Admitting: Internal Medicine

## 2016-05-10 ENCOUNTER — Encounter (HOSPITAL_COMMUNITY): Payer: Self-pay | Admitting: Emergency Medicine

## 2016-05-10 DIAGNOSIS — Z820 Family history of epilepsy and other diseases of the nervous system: Secondary | ICD-10-CM | POA: Diagnosis not present

## 2016-05-10 DIAGNOSIS — Z87891 Personal history of nicotine dependence: Secondary | ICD-10-CM | POA: Diagnosis not present

## 2016-05-10 DIAGNOSIS — G250 Essential tremor: Secondary | ICD-10-CM | POA: Diagnosis present

## 2016-05-10 DIAGNOSIS — I959 Hypotension, unspecified: Secondary | ICD-10-CM | POA: Diagnosis present

## 2016-05-10 DIAGNOSIS — I951 Orthostatic hypotension: Secondary | ICD-10-CM | POA: Diagnosis not present

## 2016-05-10 DIAGNOSIS — R791 Abnormal coagulation profile: Secondary | ICD-10-CM | POA: Diagnosis not present

## 2016-05-10 DIAGNOSIS — R040 Epistaxis: Secondary | ICD-10-CM | POA: Diagnosis not present

## 2016-05-10 DIAGNOSIS — Z79899 Other long term (current) drug therapy: Secondary | ICD-10-CM | POA: Diagnosis not present

## 2016-05-10 DIAGNOSIS — F419 Anxiety disorder, unspecified: Secondary | ICD-10-CM | POA: Diagnosis not present

## 2016-05-10 DIAGNOSIS — I4891 Unspecified atrial fibrillation: Secondary | ICD-10-CM | POA: Diagnosis present

## 2016-05-10 DIAGNOSIS — Z800 Family history of malignant neoplasm of digestive organs: Secondary | ICD-10-CM | POA: Diagnosis not present

## 2016-05-10 DIAGNOSIS — Z91013 Allergy to seafood: Secondary | ICD-10-CM

## 2016-05-10 DIAGNOSIS — I480 Paroxysmal atrial fibrillation: Secondary | ICD-10-CM | POA: Diagnosis not present

## 2016-05-10 DIAGNOSIS — Z888 Allergy status to other drugs, medicaments and biological substances status: Secondary | ICD-10-CM | POA: Diagnosis not present

## 2016-05-10 DIAGNOSIS — D62 Acute posthemorrhagic anemia: Secondary | ICD-10-CM | POA: Diagnosis present

## 2016-05-10 DIAGNOSIS — Z950 Presence of cardiac pacemaker: Secondary | ICD-10-CM

## 2016-05-10 DIAGNOSIS — N320 Bladder-neck obstruction: Secondary | ICD-10-CM | POA: Diagnosis present

## 2016-05-10 DIAGNOSIS — Z7901 Long term (current) use of anticoagulants: Secondary | ICD-10-CM | POA: Diagnosis not present

## 2016-05-10 DIAGNOSIS — Z8249 Family history of ischemic heart disease and other diseases of the circulatory system: Secondary | ICD-10-CM | POA: Diagnosis not present

## 2016-05-10 DIAGNOSIS — I10 Essential (primary) hypertension: Secondary | ICD-10-CM | POA: Diagnosis present

## 2016-05-10 DIAGNOSIS — Z933 Colostomy status: Secondary | ICD-10-CM

## 2016-05-10 DIAGNOSIS — Z7401 Bed confinement status: Secondary | ICD-10-CM

## 2016-05-10 DIAGNOSIS — G4733 Obstructive sleep apnea (adult) (pediatric): Secondary | ICD-10-CM | POA: Diagnosis present

## 2016-05-10 DIAGNOSIS — L89154 Pressure ulcer of sacral region, stage 4: Secondary | ICD-10-CM | POA: Diagnosis not present

## 2016-05-10 LAB — CBC
HEMATOCRIT: 26.3 % — AB (ref 39.0–52.0)
HEMOGLOBIN: 8.6 g/dL — AB (ref 13.0–17.0)
MCH: 29.6 pg (ref 26.0–34.0)
MCHC: 32.7 g/dL (ref 30.0–36.0)
MCV: 90.4 fL (ref 78.0–100.0)
Platelets: 231 10*3/uL (ref 150–400)
RBC: 2.91 MIL/uL — ABNORMAL LOW (ref 4.22–5.81)
RDW: 15.3 % (ref 11.5–15.5)
WBC: 14 10*3/uL — AB (ref 4.0–10.5)

## 2016-05-10 LAB — CBC WITH DIFFERENTIAL/PLATELET
BASOS ABS: 0 10*3/uL (ref 0.0–0.1)
Basophils Relative: 0 %
EOS ABS: 0.3 10*3/uL (ref 0.0–0.7)
Eosinophils Relative: 2 %
HEMATOCRIT: 33.2 % — AB (ref 39.0–52.0)
HEMOGLOBIN: 10.7 g/dL — AB (ref 13.0–17.0)
LYMPHS PCT: 16 %
Lymphs Abs: 2.3 10*3/uL (ref 0.7–4.0)
MCH: 29.6 pg (ref 26.0–34.0)
MCHC: 32.2 g/dL (ref 30.0–36.0)
MCV: 91.7 fL (ref 78.0–100.0)
MONO ABS: 0.7 10*3/uL (ref 0.1–1.0)
Monocytes Relative: 5 %
NEUTROS ABS: 11 10*3/uL — AB (ref 1.7–7.7)
NEUTROS PCT: 77 %
Platelets: 299 10*3/uL (ref 150–400)
RBC: 3.62 MIL/uL — ABNORMAL LOW (ref 4.22–5.81)
RDW: 14.9 % (ref 11.5–15.5)
WBC: 14.3 10*3/uL — ABNORMAL HIGH (ref 4.0–10.5)

## 2016-05-10 LAB — COMPREHENSIVE METABOLIC PANEL
ALK PHOS: 45 U/L (ref 38–126)
ALT: 9 U/L — AB (ref 17–63)
ANION GAP: 6 (ref 5–15)
AST: 16 U/L (ref 15–41)
Albumin: 2.3 g/dL — ABNORMAL LOW (ref 3.5–5.0)
BILIRUBIN TOTAL: 0.6 mg/dL (ref 0.3–1.2)
BUN: 9 mg/dL (ref 6–20)
CALCIUM: 7.4 mg/dL — AB (ref 8.9–10.3)
CO2: 26 mmol/L (ref 22–32)
CREATININE: 0.57 mg/dL — AB (ref 0.61–1.24)
Chloride: 101 mmol/L (ref 101–111)
GFR calc non Af Amer: >60 mL/min (ref >60)
Glucose, Bld: 112 mg/dL — ABNORMAL HIGH (ref 65–99)
Potassium: 3.8 mmol/L (ref 3.5–5.1)
Sodium: 133 mmol/L — ABNORMAL LOW (ref 135–145)
TOTAL PROTEIN: 4.5 g/dL — AB (ref 6.5–8.1)

## 2016-05-10 LAB — ABO/RH: ABO/RH(D): A POS

## 2016-05-10 LAB — APTT: aPTT: 39 s — ABNORMAL HIGH (ref 24–36)

## 2016-05-10 LAB — PROTIME-INR
INR: 1.71
PROTHROMBIN TIME: 20.3 s — AB (ref 11.4–15.2)

## 2016-05-10 MED ORDER — CEFPODOXIME PROXETIL 200 MG PO TABS
200.0000 mg | ORAL_TABLET | Freq: Two times a day (BID) | ORAL | Status: DC
  Administered 2016-05-10 – 2016-05-13 (×6): 200 mg via ORAL
  Filled 2016-05-10 (×6): qty 1

## 2016-05-10 MED ORDER — ONDANSETRON HCL 4 MG PO TABS: 4.0000 mg | ORAL_TABLET | Freq: Four times a day (QID) | ORAL | Status: DC | PRN

## 2016-05-10 MED ORDER — SODIUM CHLORIDE 0.9 % IV SOLN
Freq: Once | INTRAVENOUS | Status: AC
  Administered 2016-05-10: 999 mL/h via INTRAVENOUS

## 2016-05-10 MED ORDER — VITAMIN K1 10 MG/ML IJ SOLN
10.0000 mg | Freq: Once | INTRAVENOUS | Status: AC
  Administered 2016-05-10: 10 mg via INTRAVENOUS
  Filled 2016-05-10: qty 1

## 2016-05-10 MED ORDER — ONDANSETRON HCL 4 MG/2ML IJ SOLN
4.0000 mg | Freq: Once | INTRAMUSCULAR | Status: AC
  Administered 2016-05-10: 4 mg via INTRAVENOUS

## 2016-05-10 MED ORDER — PANTOPRAZOLE SODIUM 40 MG PO TBEC
40.0000 mg | DELAYED_RELEASE_TABLET | Freq: Two times a day (BID) | ORAL | Status: DC
  Administered 2016-05-10 – 2016-05-13 (×6): 40 mg via ORAL
  Filled 2016-05-10 (×6): qty 1

## 2016-05-10 MED ORDER — SODIUM CHLORIDE 0.9 % IV SOLN
INTRAVENOUS | Status: DC
  Administered 2016-05-10: 75 mL/h via INTRAVENOUS
  Administered 2016-05-11 – 2016-05-13 (×4): via INTRAVENOUS

## 2016-05-10 MED ORDER — ONDANSETRON HCL 4 MG/2ML IJ SOLN
INTRAMUSCULAR | Status: AC
  Filled 2016-05-10: qty 2

## 2016-05-10 MED ORDER — ACETAMINOPHEN 325 MG PO TABS
650.0000 mg | ORAL_TABLET | Freq: Four times a day (QID) | ORAL | Status: DC | PRN
  Administered 2016-05-10 – 2016-05-12 (×4): 650 mg via ORAL
  Filled 2016-05-10 (×4): qty 2

## 2016-05-10 MED ORDER — SODIUM CHLORIDE 0.9 % IV BOLUS (SEPSIS)
1000.0000 mL | Freq: Once | INTRAVENOUS | Status: AC
  Administered 2016-05-10: 1000 mL via INTRAVENOUS

## 2016-05-10 MED ORDER — ONDANSETRON HCL 4 MG/2ML IJ SOLN
4.0000 mg | Freq: Four times a day (QID) | INTRAMUSCULAR | Status: DC | PRN
  Administered 2016-05-10: 4 mg via INTRAVENOUS
  Filled 2016-05-10: qty 2

## 2016-05-10 MED ORDER — SODIUM CHLORIDE 0.9 % IV SOLN: Freq: Once | INTRAVENOUS | Status: DC

## 2016-05-10 MED ORDER — OCUVITE-LUTEIN PO CAPS
ORAL_CAPSULE | Freq: Every day | ORAL | Status: DC
  Administered 2016-05-10 – 2016-05-11 (×2): 1 via ORAL
  Filled 2016-05-10 (×7): qty 1

## 2016-05-10 MED ORDER — SODIUM CHLORIDE 0.9 % IV SOLN
Freq: Once | INTRAVENOUS | Status: AC
  Administered 2016-05-10: 500 mL via INTRAVENOUS

## 2016-05-10 MED ORDER — LIDOCAINE VISCOUS 2 % MT SOLN
15.0000 mL | Freq: Once | OROMUCOSAL | Status: AC
  Administered 2016-05-10: 15 mL via OROMUCOSAL

## 2016-05-10 MED ORDER — SENNOSIDES-DOCUSATE SODIUM 8.6-50 MG PO TABS
1.0000 | ORAL_TABLET | Freq: Every evening | ORAL | Status: DC | PRN
  Filled 2016-05-10: qty 1

## 2016-05-10 MED ORDER — LIDOCAINE VISCOUS 2 % MT SOLN
OROMUCOSAL | Status: AC
  Filled 2016-05-10: qty 15

## 2016-05-10 MED ORDER — ACETAMINOPHEN 650 MG RE SUPP: 650.0000 mg | Freq: Four times a day (QID) | RECTAL | Status: DC | PRN

## 2016-05-10 NOTE — ED Notes (Signed)
Reported to EDP that pt has a nose bleed from the right nare. Pressure applied.

## 2016-05-10 NOTE — ED Notes (Signed)
[facility].  Pt denies any complaints.

## 2016-05-10 NOTE — ED Notes (Signed)
[facility]

## 2016-05-10 NOTE — ED Triage Notes (Signed)
Pt has been spitting up bright red blood for about 30min prior to arrival. Pt was discharged from this facility Monday for the same.

## 2016-05-10 NOTE — Progress Notes (Signed)
Patient vomit approx 150cc coffee ground emesis, primary RN notified. No PRN available to give at this time.

## 2016-05-10 NOTE — Progress Notes (Signed)
[name]

## 2016-05-10 NOTE — ED Provider Notes (Addendum)
[name] DEPT Provider Note   CSN: [id number] Arrival date & time: 05/10/16  3267  Time seen 05:10 AM seen on arrival   History   Chief Complaint Chief Complaint  Patient presents with  . Epistaxis   Level V caveat for need for urgent intervention.  HPI Jon [name] is a 82 y.o. male.  HPI  EMS report patient was discharged from the hospital week ago for "the same thing". They report they were called for acute onset of bleeding approximate 20-30 minutes ago. Patient states he is spitting up blood, he thinks he may be coughing up blood. He does not think he's vomiting blood. He does have some mild upper abdominal discomfort. EMS reports they had a blood pressure of 75 systolic.  Past Medical History:  Diagnosis Date  . Abnormality of gait   . B12 deficiency   . Bilateral hydronephrosis   . Bladder calculi   . Bleeding ulcer   . Essential hypertension   . Essential tremor   . Macular degeneration of left eye   . Memory loss   . Multiple rib fractures   . Neuropathy ([place])   . OSA (obstructive sleep apnea)    Does not use CPAP  . PAF (paroxysmal atrial fibrillation) ([place])   . Tachycardia-bradycardia syndrome (HCC)    [employer] PPM - Dr. Sallyanne Kuster  . Varicose veins    Bilateral    Patient Active Problem List   Diagnosis Date Noted  . Epistaxis 05/10/2016  . Diarrhea 05/02/2016  . Prolonged QT interval   . Paroxysmal atrial fibrillation ([place])   . Tachycardia-bradycardia syndrome ([place])   . Cardiac pacemaker in situ   . Hematemesis 05/15/2015  . GIB (gastrointestinal bleeding) 05/15/2015  . Malnutrition of moderate degree ([place]) 07/11/2014  . Perianal infection 07/06/2014  . Decubitus ulcer 07/06/2014  . Sacral ulcer ([name]) 07/06/2014  . Anemia of chronic disease 06/16/2014  . Sepsis ([place]) 06/15/2014  . Lower urinary tract infectious disease 06/15/2014  . Acute kidney injury ([place]) 06/15/2014  . Bilateral hydronephrosis 06/15/2014  . Bladder calculi  06/15/2014  . Rhabdomyolysis 06/15/2014  . Multiple rib fractures 06/15/2014  . Hypotension 06/15/2014  . S/P placement of cardiac pacemaker 06/15/2014  . Dementia 06/15/2014  . AKI (acute kidney injury) ([place]) 06/15/2014  . Knee pain   . Dehydration 06/09/2014  . Hypertension 06/09/2014  . Abnormality of gait 05/03/2013  . Essential and other specified forms of tremor 05/03/2013  . Polyneuropathy in other diseases classified elsewhere ([place]) 05/03/2013  . Atrial fibrillation ([place]) 09/07/2012  . Long term current use of anticoagulant therapy 09/07/2012    Past Surgical History:  Procedure Laterality Date  . CATARACT EXTRACTION Bilateral 2013  . COLOSTOMY N/A 07/11/2014   Procedure: DIVERTING COLOSTOMY;  Surgeon: Jamesetta So, MD;  Location: [facility]S;  Service: General;  Laterality: N/A;  wound class: clean contaminated  . ESOPHAGOGASTRODUODENOSCOPY N/A 05/16/2015   Procedure: ESOPHAGOGASTRODUODENOSCOPY (EGD);  Surgeon: Danie Binder, MD;  Location: [facility] ENDO SUITE;  Service: Endoscopy;  Laterality: N/A;  . INCISION AND DRAINAGE OF WOUND N/A 07/11/2014   Procedure: DEBRIDEMENT OF SACRUM;  Surgeon: Jamesetta So, MD;  Location: [facility] ORS;  Service: General;  Laterality: N/A;  . [place]   . PACEMAKER INSERTION  2006  . PERMANENT PACEMAKER GENERATOR CHANGE N/A 08/13/2012   Procedure: PERMANENT PACEMAKER GENERATOR CHANGE;  Surgeon: Sanda Klein, MD;  Location: [place] CATH LAB;  Service: Cardiovascular;  Laterality: N/A;  Home Medications    Prior to Admission medications   Medication Sig Start Date End Date Taking? Authorizing Provider  ALPRAZolam Duanne Moron) 1 MG tablet Take 0.5 tablets (0.5 mg total) by mouth 2 (two) times daily as needed for anxiety. Patient taking differently: Take 0.5 mg by mouth 5 (five) times daily as needed for anxiety.  06/18/14   Annita Brod, MD  Amino Acids-Protein Hydrolys (FEEDING SUPPLEMENT, PRO-STAT SUGAR FREE 64,)  LIQD Take 30 mLs by mouth 3 (three) times daily with meals. 05/06/16   Thurnell Lose, MD  cefpodoxime (VANTIN) 200 MG tablet Take 1 tablet (200 mg total) by mouth every 12 (twelve) hours. 05/06/16   Thurnell Lose, MD  diphenhydrAMINE (BENADRYL) 25 MG tablet Take 25 mg by mouth every 6 (six) hours as needed for itching.    Historical Provider, MD  HYDROcodone-acetaminophen (NORCO) 10-325 MG tablet Take 1 tablet by mouth every 6 (six) hours as needed for moderate pain.  05/04/15   Historical Provider, MD  Multiple Vitamins-Minerals (ICAPS AREDS FORMULA PO) Take 1 tablet by mouth daily.    Historical Provider, MD  nystatin (MYCOSTATIN/NYSTOP) powder Apply 1 application topically daily. 02/20/16   Historical Provider, MD  pantoprazole (PROTONIX) 40 MG tablet Take 1 tablet (40 mg total) by mouth 2 (two) times daily before a meal. 05/18/15   Kathie Dike, MD  sotalol (BETAPACE) 160 MG tablet Take 1 tablet (160 mg total) by mouth 2 (two) times daily. Need appointment for future refills 06/06/15   Sanda Klein, MD  Tetrahydrozoline HCl (EYE DROPS OP) Apply 2 drops to eye daily as needed (irritation).    Historical Provider, MD  warfarin (COUMADIN) 2.5 MG tablet Take 1 tablet (2.5 mg total) by mouth daily. 05/07/16   Thurnell Lose, MD    Family History Family History  Problem Relation Age of Onset  . Aortic aneurysm Mother   . Parkinsonism Mother   . Heart disease Father   . Esophageal cancer Sister   . Colon cancer Neg Hx     Social History Social History  Substance Use Topics  . Smoking status: Former Smoker    Packs/day: 0.75    Years: 25.00    Types: Cigarettes  . Smokeless tobacco: Never Used     Comment: QUIT 35 YRS AGO  . Alcohol use No  Lives at home   Allergies   Iohexol; Shellfish allergy; Dilantin [phenytoin sodium extended]; and Tegretol [carbamazepine]   Review of Systems Review of Systems  Unable to perform ROS: Age     Physical Exam Updated Vital  Signs BP 110/78   Pulse 83   Temp 97.1 F (36.2 C) (Tympanic)   Resp 18   SpO2 98%   Vital signs normal    Physical Exam  Constitutional: He is oriented to person, place, and time.  Frail elderly male with bright red blood in his beard and mustache  HENT:  Head: Normocephalic and atraumatic.  Right Ear: External ear normal.  Left Ear: External ear normal.  No blood in the oropharynx, there is some dried blood on his lips.  Eyes: EOM are normal. Pupils are equal, round, and reactive to light.  Pale conjunctiva  Neck:  Appears to have limited range of motion of the neck  Cardiovascular: Normal rate and regular rhythm.   Pulmonary/Chest: Effort normal and breath sounds normal. No respiratory distress. He has no wheezes. He has no rales. He exhibits no tenderness.  Abdominal: Soft. He exhibits no mass. There  is tenderness. There is no guarding.    Musculoskeletal: He exhibits no tenderness or deformity.  Loss of muscle mass  Neurological: He is alert and oriented to person, place, and time.  Skin: There is pallor.  Cool and dry to touch  Nursing note and vitals reviewed.    ED Treatments / Results  Labs (all labs ordered are listed, but only abnormal results are displayed) Results for orders placed or performed during the hospital encounter of 05/10/16  Comprehensive metabolic panel  Result Value Ref Range   Sodium 133 (L) 135 - 145 mmol/L   Potassium 3.8 3.5 - 5.1 mmol/L   Chloride 101 101 - 111 mmol/L   CO2 26 22 - 32 mmol/L   Glucose, Bld 112 (H) 65 - 99 mg/dL   BUN 9 6 - 20 mg/dL   Creatinine, Ser 0.57 (L) 0.61 - 1.24 mg/dL   Calcium 7.4 (L) 8.9 - 10.3 mg/dL   Total Protein 4.5 (L) 6.5 - 8.1 g/dL   Albumin 2.3 (L) 3.5 - 5.0 g/dL   AST 16 15 - 41 U/L   ALT 9 (L) 17 - 63 U/L   Alkaline Phosphatase 45 38 - 126 U/L   Total Bilirubin 0.6 0.3 - 1.2 mg/dL   GFR calc non Af Amer >60 >60 mL/min   GFR calc Af Amer >60 >60 mL/min   Anion gap 6 5 - 15  CBC with  Differential  Result Value Ref Range   WBC 14.3 (H) 4.0 - 10.5 K/uL   RBC 3.62 (L) 4.22 - 5.81 MIL/uL   Hemoglobin 10.7 (L) 13.0 - 17.0 g/dL   HCT 33.2 (L) 39.0 - 52.0 %   MCV 91.7 78.0 - 100.0 fL   MCH 29.6 26.0 - 34.0 pg   MCHC 32.2 30.0 - 36.0 g/dL   RDW 14.9 11.5 - 15.5 %   Platelets 299 150 - 400 K/uL   Neutrophils Relative % 77 %   Lymphocytes Relative 16 %   Monocytes Relative 5 %   Eosinophils Relative 2 %   Basophils Relative 0 %   Neutro Abs 11.0 (H) 1.7 - 7.7 K/uL   Lymphs Abs 2.3 0.7 - 4.0 K/uL   Monocytes Absolute 0.7 0.1 - 1.0 K/uL   Eosinophils Absolute 0.3 0.0 - 0.7 K/uL   Basophils Absolute 0.0 0.0 - 0.1 K/uL  Protime-INR  Result Value Ref Range   Prothrombin Time 20.3 (H) 11.4 - 15.2 seconds   INR 1.71   APTT  Result Value Ref Range   aPTT 39 (H) 24 - 36 seconds   Laboratory interpretation all normal except stable anemia    EKG  EKG Interpretation  Date/Time:  Friday May 10 2016 05:18:48 EST Ventricular Rate:  97 PR Interval:    QRS Duration: 92 QT Interval:  409 QTC Calculation: 520 R Axis:   57 Text Interpretation:  Atrial fibrillation Abnormal R-wave progression, early transition Borderline repolarization abnormality Prolonged QT interval No significant change since last tracing 03 May 2016 Confirmed by [facility]  MD-I, Jeneen Doutt ([id number]) on 05/10/2016 5:50:14 AM       Radiology Dg Chest Port 1 View  Result Date: 05/10/2016 CLINICAL DATA:  82 y/o  M; hemoptysis. EXAM: PORTABLE CHEST 1 VIEW COMPARISON:  05/04/2016 chest radiograph. FINDINGS: Patient is rotated. Stable cardiac silhouette given projection and technique. Aortic atherosclerosis with arch calcification. Stable small left effusion and left basilar opacity probably represent atelectasis. Probable trace right effusion. No new focal consolidation. Two lead pacemaker.  No acute osseous abnormality is identified. IMPRESSION: Stable small left and probable trace right pleural effusions with  associated basilar atelectasis. No new focal consolidation. Aortic atherosclerosis. Electronically Signed   By: Kristine Garbe M.D.   On: 05/10/2016 05:42    Procedures Procedures (including critical care time)  Medications Ordered in ED Medications  lidocaine (XYLOCAINE) 2 % viscous mouth solution (not administered)  sodium chloride 0.9 % bolus 1,000 mL (0 mLs Intravenous Stopped 05/10/16 0600)  sodium chloride 0.9 % bolus 1,000 mL (0 mLs Intravenous Stopped 05/10/16 0642)  0.9 %  sodium chloride infusion (999 mL/hr Intravenous New Bag/Given 05/10/16 0809)  lidocaine (XYLOCAINE) 2 % viscous mouth solution 15 mL (15 mLs Mouth/Throat Given by Other 05/10/16 0611)  0.9 %  sodium chloride infusion (500 mLs Intravenous New Bag/Given 05/10/16 0611)  ondansetron (ZOFRAN) injection 4 mg (4 mg Intravenous Given 05/10/16 0742)     Initial Impression / Assessment and Plan / ED Course  I have reviewed the triage vital signs and the nursing notes.  Pertinent labs & imaging results that were available during my care of the patient were reviewed by me and considered in my medical decision making (see chart for details).  Clinical Course    Patient's initial blood pressure here was 69 systolic. He was given IV fluids and his blood pressure improved to 110/78.  Nurses report they had pulled patient over and when they rolled him back over he was having blood coming out of his right nostril. They held pressure and it resolved.  5:25 AM patient's nose is bleeding again. His blood pressure is 113/93 with heart rate 92. I'm preparing to put in a [employer].  06:00 AM Has pressure device on his nose, removed and is still bleeding. Rhinorocket (posterior) placed in right nostril but met a resistance and it wouldn't go in all the way into his nostril. Posterior balloon was inflated, the anterior was mainly outside the nose.  BP was in the 90's.   Recheck at 6:55 AM. The Rhino Rocket is saturated  with blood however he is not actively bleeding anteriorly. He states he no longer feels like he has blood dripping down his throat. Son is at bedside. Blood pressure is 98/63, heart rate 75 with paced rhythm. He has had 2500 cc of fluid. We discussed admission.  07:13 AM Dr Judieth Keens, wants him admitted to [facility] to stepdown, observation  Pt vomited some blood, most likely from the prior nose bleeding. He was given IV zofran.   8:30 AM ENT has not called back after 1 hour.  Final Clinical Impressions(s) / ED Diagnoses   Final diagnoses:  Hypotension, unspecified hypotension type  Right-sided epistaxis   Plan transfer for admission to [facility], MD, [place] Performed by: Madsen Riddle L Armaan Pond Total critical care time: 40 minutes Critical care time was exclusive of separately billable procedures and treating other patients. Critical care was necessary to treat or prevent imminent or life-threatening deterioration. Critical care was time spent personally by me on the following activities: development of treatment plan with patient and/or surrogate as well as nursing, discussions with consultants, evaluation of patient's response to treatment, examination of patient, obtaining history from patient or surrogate, ordering and performing treatments and interventions, ordering and review of laboratory studies, ordering and review of radiographic studies, pulse oximetry and re-evaluation of patient's condition.    Rolland Porter, MD 05/10/16 [place], MD 05/10/16 [phone]

## 2016-05-10 NOTE — H&P (Signed)
History and Physical    Jon [name] WUJ:[id number] DOB: 08-09-1931 DOA: 05/10/2016  Referring MD/NP/PA: Devoria Albe, EDP PCP: Milana Obey, MD  Patient coming from: Home  Chief Complaint: "Vomiting blood"  HPI: Jon [name] is a 82 y.o. male with a recent hospitalization for sepsis due to UTI with indwelling foley catheter. He also has a h/o a fib and is on chronic anticoagulation with coumadin. He is brought in by EMS today due to what he describes as hematemesis. He had another episode in the ED witnessed by RN of about 150 cc. It was then noticed that what he actually had was epistaxis from the right nare. During his recent hospitalization he had an NG placed on that side.He was initially hypotensive in the 60s-70s and responded nicely to 2500 cc fluid bolus; however his BP has again dropped into the 80 range systolic. Hb remains stable at his baseline of around 10.7. EDP has placed rhino rocket and bleeding seems to have stopped. However admission for observation is requested given his hypotension and severity of bleeding. I have requested patient be admitted to [facility] due to lack of ENT coverage at [facility] in case he were to bleed again.  Past Medical/Surgical History: Past Medical History:  Diagnosis Date  . Abnormality of gait   . B12 deficiency   . Bilateral hydronephrosis   . Bladder calculi   . Bleeding ulcer   . Essential hypertension   . Essential tremor   . Macular degeneration of left eye   . Memory loss   . Multiple rib fractures   . Neuropathy (HCC)   . OSA (obstructive sleep apnea)    Does not use CPAP  . PAF (paroxysmal atrial fibrillation) (HCC)   . Tachycardia-bradycardia syndrome (HCC)    [employer] PPM - Dr. Royann Shivers  . Varicose veins    Bilateral    Past Surgical History:  Procedure Laterality Date  . CATARACT EXTRACTION Bilateral 2013  . COLOSTOMY N/A 07/11/2014   Procedure: DIVERTING COLOSTOMY;  Surgeon: Dalia Heading, MD;  Location: [facility] ORS;   Service: General;  Laterality: N/A;  wound class: clean contaminated  . ESOPHAGOGASTRODUODENOSCOPY N/A 05/16/2015   Procedure: ESOPHAGOGASTRODUODENOSCOPY (EGD);  Surgeon: West Bali, MD;  Location: [facility] ENDO SUITE;  Service: Endoscopy;  Laterality: N/A;  . INCISION AND DRAINAGE OF WOUND N/A 07/11/2014   Procedure: DEBRIDEMENT OF SACRUM;  Surgeon: Dalia Heading, MD;  Location: [facility] ORS;  Service: General;  Laterality: N/A;  . MENINGIOMA REMOVAL  1992   POST RT FRONTAL   . PACEMAKER INSERTION  2006  . PERMANENT PACEMAKER GENERATOR CHANGE N/A 08/13/2012   Procedure: PERMANENT PACEMAKER GENERATOR CHANGE;  Surgeon: Thurmon Fair, MD;  Location: [facility] CATH LAB;  Service: Cardiovascular;  Laterality: N/A;    Social History:  reports that he has quit smoking. His smoking use included Cigarettes. He has a 18.75 pack-year smoking history. He has never used smokeless tobacco. He reports that he does not drink alcohol or use drugs.  Allergies: Allergies  Allergen Reactions  . Iohexol      Desc: PT STATES THROAT/FACE SWELLING W/ IVP DYE IN 1990. PT HAS NOT HAD ANY EXAMS DONE W/DYE SINCE AND HAS NEVER BEEN PRE MEDICATED. PER PT, Onset Date: [id number]   . Shellfish Allergy   . Dilantin [Phenytoin Sodium Extended] Rash  . Tegretol [Carbamazepine] Rash    Family History:  Family History  Problem Relation Age of Onset  . Aortic aneurysm Mother   .  Parkinsonism Mother   . Heart disease Father   . Esophageal cancer Sister   . Colon cancer Neg Hx     Prior to Admission medications   Medication Sig Start Date End Date Taking? Authorizing Provider  ALPRAZolam Prudy Feeler(XANAX) 1 MG tablet Take 0.5 tablets (0.5 mg total) by mouth 2 (two) times daily as needed for anxiety. Patient taking differently: Take 0.5 mg by mouth 5 (five) times daily as needed for anxiety.  06/18/14   Hollice EspySendil K Krishnan, MD  Amino Acids-Protein Hydrolys (FEEDING SUPPLEMENT, PRO-STAT SUGAR FREE 64,) LIQD Take 30 mLs by mouth 3 (three) times  daily with meals. 05/06/16   Leroy SeaPrashant K Singh, MD  cefpodoxime (VANTIN) 200 MG tablet Take 1 tablet (200 mg total) by mouth every 12 (twelve) hours. 05/06/16   Leroy SeaPrashant K Singh, MD  diphenhydrAMINE (BENADRYL) 25 MG tablet Take 25 mg by mouth every 6 (six) hours as needed for itching.    Historical Provider, MD  HYDROcodone-acetaminophen (NORCO) 10-325 MG tablet Take 1 tablet by mouth every 6 (six) hours as needed for moderate pain.  05/04/15   Historical Provider, MD  Multiple Vitamins-Minerals (ICAPS AREDS FORMULA PO) Take 1 tablet by mouth daily.    Historical Provider, MD  nystatin (MYCOSTATIN/NYSTOP) powder Apply 1 application topically daily. 02/20/16   Historical Provider, MD  pantoprazole (PROTONIX) 40 MG tablet Take 1 tablet (40 mg total) by mouth 2 (two) times daily before a meal. 05/18/15   Erick BlinksJehanzeb Memon, MD  sotalol (BETAPACE) 160 MG tablet Take 1 tablet (160 mg total) by mouth 2 (two) times daily. Need appointment for future refills 06/06/15   Thurmon FairMihai Croitoru, MD  Tetrahydrozoline HCl (EYE DROPS OP) Apply 2 drops to eye daily as needed (irritation).    Historical Provider, MD  warfarin (COUMADIN) 2.5 MG tablet Take 1 tablet (2.5 mg total) by mouth daily. 05/07/16   Leroy SeaPrashant K Singh, MD    Review of Systems:  Constitutional: Denies fever, chills, diaphoresis, appetite change and fatigue.  HEENT: Denies photophobia, eye pain, redness, hearing loss, ear pain, congestion, sore throat, rhinorrhea, sneezing, mouth sores, trouble swallowing, neck pain, neck stiffness and tinnitus.   Respiratory: Denies SOB, DOE, cough, chest tightness,  and wheezing.   Cardiovascular: Denies chest pain, palpitations and leg swelling.  Gastrointestinal: Denies nausea, vomiting, abdominal pain, diarrhea, constipation, blood in stool and abdominal distention.  Genitourinary: Denies dysuria, urgency, frequency, hematuria, flank pain and difficulty urinating.  Endocrine: Denies: hot or cold intolerance, sweats,  changes in hair or nails, polyuria, polydipsia. Musculoskeletal: Denies myalgias, back pain, joint swelling, arthralgias and gait problem.  Skin: Denies pallor, rash and wound.  Neurological: Denies dizziness, seizures, syncope, weakness, light-headedness, numbness and headaches.  Hematological: Denies adenopathy. Easy bruising, personal or family bleeding history  Psychiatric/Behavioral: Denies suicidal ideation, mood changes, confusion, nervousness, sleep disturbance and agitation    Physical Exam: Vitals:   05/10/16 0546 05/10/16 0641 05/10/16 0700 05/10/16 0800  BP:  98/63 97/73 (!) 77/60  Pulse:  75    Resp:  13 13 15   Temp: 97.1 F (36.2 C)     TempSrc: Tympanic     SpO2:  98% 96% 99%     Constitutional: NAD, calm, significant blood over nose, face, beard and bedclothes. Eyes: PERRL, lids and conjunctivae normal ENMT: Mucous membranes are moist. Posterior pharynx clear of any exudate or lesions.Poor dentition.  Neck: normal, supple, no masses, no thyromegaly Respiratory: clear to auscultation bilaterally, no wheezing, no crackles. Normal respiratory effort. No accessory muscle  use.  Cardiovascular: Regular rate and rhythm, no murmurs / rubs / gallops.. 2+ pedal pulses. No carotid bruits.  Abdomen: no tenderness, no masses palpated. No hepatosplenomegaly. Bowel sounds positive.  Musculoskeletal: 1-2+ edema bilaterally Skin: no rashes, lesions, ulcers. No induration Neurologic: CN 2-12 grossly intact. Sensation intact, DTR normal. Strength 5/5 in all 4.  Psychiatric: Normal judgment and insight. Alert and oriented x 3. Normal mood.    Labs on Admission: I have personally reviewed the following labs and imaging studies  CBC:  Recent Labs Lab 05/04/16 0847 05/05/16 0420 05/10/16 0530  WBC 13.9* 11.2* 14.3*  NEUTROABS  --   --  11.0*  HGB 10.6* 10.2* 10.7*  HCT 32.4* 30.8* 33.2*  MCV 91.0 91.4 91.7  PLT 99* 89* 299   Basic Metabolic Panel:  Recent Labs Lab  05/04/16 0541 05/05/16 0420 05/10/16 0530  NA 140 139 133*  K 4.1 3.8 3.8  CL 115* 113* 101  CO2 19* 20* 26  GLUCOSE 116* 108* 112*  BUN 44* 37* 9  CREATININE 1.28* 1.13 0.57*  CALCIUM 7.9* 7.9* 7.4*  MG 2.0 1.8  --    GFR: Estimated Creatinine Clearance: 68.7 mL/min (by C-G formula based on SCr of 0.57 mg/dL (L)). Liver Function Tests:  Recent Labs Lab 05/10/16 0530  AST 16  ALT 9*  ALKPHOS 45  BILITOT 0.6  PROT 4.5*  ALBUMIN 2.3*   No results for input(s): LIPASE, AMYLASE in the last 168 hours. No results for input(s): AMMONIA in the last 168 hours. Coagulation Profile:  Recent Labs Lab 05/04/16 0847 05/10/16 0530  INR 2.91 1.71   Cardiac Enzymes: No results for input(s): CKTOTAL, CKMB, CKMBINDEX, TROPONINI in the last 168 hours. BNP (last 3 results) No results for input(s): PROBNP in the last 8760 hours. HbA1C: No results for input(s): HGBA1C in the last 72 hours. CBG: No results for input(s): GLUCAP in the last 168 hours. Lipid Profile: No results for input(s): CHOL, HDL, LDLCALC, TRIG, CHOLHDL, LDLDIRECT in the last 72 hours. Thyroid Function Tests: No results for input(s): TSH, T4TOTAL, FREET4, T3FREE, THYROIDAB in the last 72 hours. Anemia Panel: No results for input(s): VITAMINB12, FOLATE, FERRITIN, TIBC, IRON, RETICCTPCT in the last 72 hours. Urine analysis:    Component Value Date/Time   COLORURINE YELLOW 05/02/2016 0815   APPEARANCEUR CLOUDY (A) 05/02/2016 0815   LABSPEC <1.005 (L) 05/02/2016 0815   PHURINE 7.5 05/02/2016 0815   GLUCOSEU NEGATIVE 05/02/2016 0815   HGBUR LARGE (A) 05/02/2016 0815   BILIRUBINUR NEGATIVE 05/02/2016 0815   KETONESUR NEGATIVE 05/02/2016 0815   PROTEINUR 30 (A) 05/02/2016 0815   UROBILINOGEN 0.2 03/14/2015 1515   NITRITE NEGATIVE 05/02/2016 0815   LEUKOCYTESUR LARGE (A) 05/02/2016 0815   Sepsis Labs: @LABRCNTIP (procalcitonin:4,lacticidven:4) ) Recent Results (from the past 240 hour(s))  Urine culture      Status: Abnormal   Collection Time: 05/02/16  8:15 AM  Result Value Ref Range Status   Specimen Description URINE, CLEAN CATCH  Final   Special Requests NONE  Final   Culture MULTIPLE SPECIES PRESENT, SUGGEST RECOLLECTION (A)  Final   Report Status 05/03/2016 FINAL  Final  Gastrointestinal Panel by PCR , Stool     Status: None   Collection Time: 05/02/16  8:33 AM  Result Value Ref Range Status   Campylobacter species NOT DETECTED NOT DETECTED Final   Plesimonas shigelloides NOT DETECTED NOT DETECTED Final   Salmonella species NOT DETECTED NOT DETECTED Final   Yersinia enterocolitica NOT DETECTED NOT DETECTED  Final   Vibrio species NOT DETECTED NOT DETECTED Final   Vibrio cholerae NOT DETECTED NOT DETECTED Final   Enteroaggregative E coli (EAEC) NOT DETECTED NOT DETECTED Final   Enteropathogenic E coli (EPEC) NOT DETECTED NOT DETECTED Final   Enterotoxigenic E coli (ETEC) NOT DETECTED NOT DETECTED Final   Shiga like toxin producing E coli (STEC) NOT DETECTED NOT DETECTED Final   Shigella/Enteroinvasive E coli (EIEC) NOT DETECTED NOT DETECTED Final   Cryptosporidium NOT DETECTED NOT DETECTED Final   Cyclospora cayetanensis NOT DETECTED NOT DETECTED Final   Entamoeba histolytica NOT DETECTED NOT DETECTED Final   Giardia lamblia NOT DETECTED NOT DETECTED Final   Adenovirus F40/41 NOT DETECTED NOT DETECTED Final   Astrovirus NOT DETECTED NOT DETECTED Final   Norovirus GI/GII NOT DETECTED NOT DETECTED Final   Rotavirus A NOT DETECTED NOT DETECTED Final   Sapovirus (I, II, IV, and V) NOT DETECTED NOT DETECTED Final  C difficile quick scan w PCR reflex     Status: Abnormal   Collection Time: 05/02/16  8:33 AM  Result Value Ref Range Status   C Diff antigen POSITIVE (A) NEGATIVE Final   C Diff toxin NEGATIVE NEGATIVE Final   C Diff interpretation Results are indeterminate. See PCR results.  Final  Clostridium Difficile by PCR     Status: Abnormal   Collection Time: 05/02/16  8:33 AM    Result Value Ref Range Status   Toxigenic C Difficile by pcr POSITIVE (A) NEGATIVE Final    Comment: Positive for toxigenic C. difficile with little to no toxin production. Only treat if clinical presentation suggests symptomatic illness. [facility]    Blood Culture (routine x 2)     Status: None   Collection Time: 05/02/16  9:24 AM  Result Value Ref Range Status   Specimen Description BLOOD RIGHT HAND  Final   Special Requests BOTTLES DRAWN AEROBIC ONLY 5CC  Final   Culture NO GROWTH 5 DAYS  Final   Report Status 05/07/2016 FINAL  Final  Blood Culture (routine x 2)     Status: None   Collection Time: 05/02/16  9:30 AM  Result Value Ref Range Status   Specimen Description BLOOD RIGHT ANTECUBITAL  Final   Special Requests BOTTLES DRAWN AEROBIC AND ANAEROBIC 8CC  Final   Culture NO GROWTH 5 DAYS  Final   Report Status 05/07/2016 FINAL  Final  MRSA PCR Screening     Status: Abnormal   Collection Time: 05/02/16 12:50 PM  Result Value Ref Range Status   MRSA by PCR POSITIVE (A) NEGATIVE Final    Comment:        The GeneXpert MRSA Assay (FDA approved for NASAL specimens only), is one component of a comprehensive MRSA colonization surveillance program. It is not intended to diagnose MRSA infection nor to guide or monitor treatment for MRSA infections. RESULT CALLED TO, READ BACK BY AND VERIFIED WITH: ROSE,C ON 05/02/16 AT 2230 BY LOY,C      Radiological Exams on Admission: Dg Chest Port 1 View  Result Date: 05/10/2016 CLINICAL DATA:  82 y/o  M; hemoptysis. EXAM: PORTABLE CHEST 1 VIEW COMPARISON:  05/04/2016 chest radiograph. FINDINGS: Patient is rotated. Stable cardiac silhouette given projection and technique. Aortic atherosclerosis with arch calcification. Stable small left effusion and left basilar opacity probably represent atelectasis. Probable trace right effusion. No new focal consolidation. Two lead pacemaker. No acute osseous abnormality is  identified. IMPRESSION: Stable small left and probable trace right pleural effusions with  associated basilar atelectasis. No new focal consolidation. Aortic atherosclerosis. Electronically Signed   By: Mitzi Hansen M.D.   On: 05/10/2016 05:42    EKG: Independently reviewed. Paced rhythm  Assessment/Plan Principal Problem:   Epistaxis Active Problems:   Hypotension   Atrial fibrillation (HCC)   Long term current use of anticoagulant therapy    Epistaxis -Seems to have resolved with rhino rocket placement. -Due to severity of bleed with hypotension EDP has requested admission for observation which I agree with to monitor BP and Hb. -His Hb is currently at his baseline of around 10, but would not be surprised if it were to drop further given magnitude of bleed. -I suspect method of injury was recent NG placement in a patient anticoagulated on coumadin. INR is subtherapeutic but elevated at 1.71. Will give 10 mg of vit K to fully reverse. Recheck INR in am. -Will order CBC q 8 hours and hold 2 units of PRBCs for now. No transfusion as of yet, but suspect will need one. -Transfer to [facility] given availability of ENT in case he rebleeds. If no further signs of bleeding, can DC over next 24 hours with rhino rocket in place for OP ENT evaluation next week.  Hypotension -Due to epistaxis. -Continue fluid boluses, monitor Hb and transfuse if needed. -Currently giving another liter bolus for a total of 3500 cc. -SBP currently in the 80s.  A Fib -Currently paced, rate controlled. -holding coumadin given epistaxis.  Indwelling foley cathter due to BOO -Noted.   DVT prophylaxis: SCDs  Code Status: full code  Family Communication: discussed with son at bedside and all questions answered  Disposition Plan: transfer to [facility]  Consults called: none  Admission status: observation    Time Spent: 80 minutes  Chaya Jan MD [facility] Pager [phone]  If  7PM-7AM, please contact night-coverage [email or web address] Password [email or web address]  05/10/2016, 8:15 AM

## 2016-05-10 NOTE — ED Notes (Signed)
Pt denies any needs.  Family at bedside.  Attempted to clean pt's face.  States he does not want it completed at this time due to chapped lips.

## 2016-05-10 NOTE — ED Notes (Signed)
Per Dr. Ardyth HarpsHernandez give another 1L ns bolus for hypotension.

## 2016-05-11 DIAGNOSIS — I10 Essential (primary) hypertension: Secondary | ICD-10-CM | POA: Diagnosis present

## 2016-05-11 DIAGNOSIS — G250 Essential tremor: Secondary | ICD-10-CM | POA: Diagnosis present

## 2016-05-11 DIAGNOSIS — I480 Paroxysmal atrial fibrillation: Secondary | ICD-10-CM | POA: Diagnosis present

## 2016-05-11 DIAGNOSIS — D62 Acute posthemorrhagic anemia: Secondary | ICD-10-CM

## 2016-05-11 DIAGNOSIS — Z87891 Personal history of nicotine dependence: Secondary | ICD-10-CM | POA: Diagnosis not present

## 2016-05-11 DIAGNOSIS — I482 Chronic atrial fibrillation: Secondary | ICD-10-CM | POA: Diagnosis not present

## 2016-05-11 DIAGNOSIS — Z91013 Allergy to seafood: Secondary | ICD-10-CM | POA: Diagnosis not present

## 2016-05-11 DIAGNOSIS — R791 Abnormal coagulation profile: Secondary | ICD-10-CM | POA: Diagnosis present

## 2016-05-11 DIAGNOSIS — R040 Epistaxis: Secondary | ICD-10-CM | POA: Diagnosis not present

## 2016-05-11 DIAGNOSIS — L89154 Pressure ulcer of sacral region, stage 4: Secondary | ICD-10-CM | POA: Diagnosis present

## 2016-05-11 DIAGNOSIS — I951 Orthostatic hypotension: Secondary | ICD-10-CM | POA: Diagnosis not present

## 2016-05-11 DIAGNOSIS — G4733 Obstructive sleep apnea (adult) (pediatric): Secondary | ICD-10-CM | POA: Diagnosis present

## 2016-05-11 DIAGNOSIS — Z933 Colostomy status: Secondary | ICD-10-CM | POA: Diagnosis not present

## 2016-05-11 DIAGNOSIS — Z888 Allergy status to other drugs, medicaments and biological substances status: Secondary | ICD-10-CM | POA: Diagnosis not present

## 2016-05-11 DIAGNOSIS — I959 Hypotension, unspecified: Secondary | ICD-10-CM | POA: Diagnosis present

## 2016-05-11 DIAGNOSIS — Z820 Family history of epilepsy and other diseases of the nervous system: Secondary | ICD-10-CM | POA: Diagnosis not present

## 2016-05-11 DIAGNOSIS — Z8249 Family history of ischemic heart disease and other diseases of the circulatory system: Secondary | ICD-10-CM | POA: Diagnosis not present

## 2016-05-11 DIAGNOSIS — Z7401 Bed confinement status: Secondary | ICD-10-CM | POA: Diagnosis not present

## 2016-05-11 DIAGNOSIS — I4891 Unspecified atrial fibrillation: Secondary | ICD-10-CM | POA: Diagnosis not present

## 2016-05-11 DIAGNOSIS — Z7901 Long term (current) use of anticoagulants: Secondary | ICD-10-CM | POA: Diagnosis not present

## 2016-05-11 DIAGNOSIS — Z950 Presence of cardiac pacemaker: Secondary | ICD-10-CM | POA: Diagnosis not present

## 2016-05-11 DIAGNOSIS — Z79899 Other long term (current) drug therapy: Secondary | ICD-10-CM | POA: Diagnosis not present

## 2016-05-11 DIAGNOSIS — Z800 Family history of malignant neoplasm of digestive organs: Secondary | ICD-10-CM | POA: Diagnosis not present

## 2016-05-11 DIAGNOSIS — F419 Anxiety disorder, unspecified: Secondary | ICD-10-CM | POA: Diagnosis present

## 2016-05-11 DIAGNOSIS — N320 Bladder-neck obstruction: Secondary | ICD-10-CM | POA: Diagnosis present

## 2016-05-11 LAB — CBC
HCT: 27.6 % — ABNORMAL LOW (ref 39.0–52.0)
HEMATOCRIT: 22.9 % — AB (ref 39.0–52.0)
HEMOGLOBIN: 7.4 g/dL — AB (ref 13.0–17.0)
Hemoglobin: 9.1 g/dL — ABNORMAL LOW (ref 13.0–17.0)
MCH: 29.2 pg (ref 26.0–34.0)
MCH: 29.1 pg (ref 26.0–34.0)
MCHC: 32.3 g/dL (ref 30.0–36.0)
MCHC: 33 g/dL (ref 30.0–36.0)
MCV: 90.5 fL (ref 78.0–100.0)
MCV: 88.2 fL (ref 78.0–100.0)
PLATELETS: 200 10*3/uL (ref 150–400)
Platelets: 223 10*3/uL (ref 150–400)
RBC: 2.53 MIL/uL — ABNORMAL LOW (ref 4.22–5.81)
RBC: 3.13 MIL/uL — AB (ref 4.22–5.81)
RDW: 15.3 % (ref 11.5–15.5)
RDW: 16.6 % — ABNORMAL HIGH (ref 11.5–15.5)
WBC: 12.2 10*3/uL — AB (ref 4.0–10.5)
WBC: 12.2 10*3/uL — ABNORMAL HIGH (ref 4.0–10.5)

## 2016-05-11 LAB — HEMOGLOBIN AND HEMATOCRIT, BLOOD
HCT: 29.8 % — ABNORMAL LOW (ref 39.0–52.0)
Hemoglobin: 10.2 g/dL — ABNORMAL LOW (ref 13.0–17.0)

## 2016-05-11 LAB — PROTIME-INR
INR: 1.24
PROTHROMBIN TIME: 15.7 s — AB (ref 11.4–15.2)

## 2016-05-11 LAB — PREPARE RBC (CROSSMATCH)

## 2016-05-11 MED ORDER — SODIUM CHLORIDE 0.9 % IV SOLN
Freq: Once | INTRAVENOUS | Status: AC
  Administered 2016-05-11: 06:00:00 via INTRAVENOUS

## 2016-05-11 MED ORDER — WHITE PETROLATUM GEL
Status: AC
  Administered 2016-05-11: 12:00:00
  Filled 2016-05-11: qty 1

## 2016-05-11 MED ORDER — ENSURE ENLIVE PO LIQD
237.0000 mL | Freq: Three times a day (TID) | ORAL | Status: DC
  Administered 2016-05-11 – 2016-05-13 (×7): 237 mL via ORAL

## 2016-05-11 MED ORDER — ACETAMINOPHEN 325 MG PO TABS
650.0000 mg | ORAL_TABLET | Freq: Once | ORAL | Status: AC
  Administered 2016-05-11: 650 mg via ORAL
  Filled 2016-05-11: qty 2

## 2016-05-11 NOTE — Progress Notes (Addendum)
Initial Nutrition Assessment  DOCUMENTATION CODES:  Non-severe (moderate) malnutrition in context of chronic illness   Pt meets criteria for MODERATE MALNUTRITION in the context of Chronic illness as evidenced by Moderate Muscle Wasting and Severe Fat wasting.  INTERVENTION:  Magic cup BID with meals, each supplement provides 290 kcal and 9 grams of protein  Ensure Enlive po BID, each supplement provides 350 kcal and 20 grams of protein  High protein HS snack.   NUTRITION DIAGNOSIS:  Inadequate oral intake related to poor appetite as evidenced by meal completion < 50%.  GOAL:  Patient will meet greater than or equal to 90% of their needs  MONITOR:  PO intake, Supplement acceptance, Labs, Weight trends  REASON FOR ASSESSMENT:  Consult Wound healing + Assessment of status + Poor appetite  ASSESSMENT:  82 y/o male PMHx Afib, htn, tachycardia-bradycardia syndrome.  Was recently hospitalized 11/16-11/20 for sepsis due to UTI w/ indwelling foley. Represented with hematemesis, though found to be epistaxis. [facility]. Found to be anemic  Pt is a poor historian and was very vague regarding his poor PO intake. He denies n/v/c/d (though has documenteddiarrhea today). He reports that he eats well generally, but says he "just isnt hungry right now". RNs at bedside report him eating poorly today. No meal documentation from this admission, however prior admission pt was eating poorly-25-50%. Unable to determine how he was eating prior to recent hospitilization  Pt really could not identify any food he was hungry for. He did request some ice cream. He says he "Loves" Ensure. Will order TID.   Pt has not been weighed this admission. It look like his lowest weight from last admission was 137.5 lbs. His weight history appears to not follow any trend and would question the weights accuracy.   NFPE: Pt exhibits severe wasting of thoracic cavity and moderate wasting of temporalis and clavicular  musculature.   Labs: WBC: 12.2, Albumin: 2.3,  Medications: NS, ppi, mvi w/ lutein, PO abx   Recent Labs Lab 05/05/16 0420 05/10/16 0530  NA 139 133*  K 3.8 3.8  CL 113* 101  CO2 20* 26  BUN 37* 9  CREATININE 1.13 0.57*  CALCIUM 7.9* 7.4*  MG 1.8  --   GLUCOSE 108* 112*   Diet Order:  DIET SOFT Room service appropriate? Yes; Fluid consistency: Thin  Skin:  Stage 3 PU to coccyx  Last BM:  11/25  Height:  Ht Readings from Last 1 Encounters:  05/02/16 5\' 10"  (1.778 m)   Weight:  Wt Readings from Last 1 Encounters:  05/05/16 150 lb 5.7 oz (68.2 kg)   Wt Readings from Last 10 Encounters:  05/05/16 150 lb 5.7 oz (68.2 kg)  05/15/15 140 lb 10.5 oz (63.8 kg)  05/09/15 193 lb (87.5 kg)  07/08/14 193 lb 12.8 oz (87.9 kg)  06/16/14 189 lb 9.5 oz (86 kg)  06/09/14 180 lb (81.6 kg)  08/26/13 186 lb 6.4 oz (84.6 kg)  05/03/13 191 lb (86.6 kg)  08/13/12 201 lb (91.2 kg)  Dosing weight: 137 9.1oz. (62.53 kg)  Ideal Body Weight:  75.45 kg  BMI:  Body mass index is 21.35 kg/m.  Estimated Nutritional Needs:  Kcal:  1900-2100 (30-34 kcal/kg bw) Protein:  88-100 g (1.4-1.6 g/kg bw) Fluid:  1.9-2.1 Liters fluid + enough to replace stool losses  EDUCATION NEEDS:  No education needs identified at this time  Christophe LouisNathan Nylah Butkus RD, LDN, CNSC Clinical Nutrition Pager: [phone] 25-May-202017 1:42 PM

## 2016-05-11 NOTE — Progress Notes (Signed)
[facility] PROGRESS NOTE  Jon CutterWilliam P Hard [id number] DOB: 11/19/1931 DOA: 05/10/2016  PCP: Milana ObeyStephen D Knowlton, MD  Brief History/Interval Summary: 82 year old Caucasian male with past medical history of atrial fibrillation on anticoagulation, history of tachycardia-bradycardia syndrome status post pacemaker, history of essential hypertension who was recently hospitalized at [name] for sepsis due to UTI with indwelling Foley catheter. There was some concern for gastric outlet obstruction requiring NG tube placement. He responded to treatment and was discharged home. Patient presented to the emergency department with complaints of "bloody emesis", however, was noted to have epistaxis. He was found to be anemic. He was hospitalized for further management.  Reason for Visit: Epistaxis  Consultants: Phone discussion with Dr. Cloria SpringWoliki  Procedures: None yet  Antibiotics: Vantin  Subjective/Interval History: Patient feels well this morning. Hasn't noticed any bleeding since last night. His son is at the bedside. He denies any pain. Denies any abdominal discomfort, nausea or vomiting.  ROS: Denies any chest pain or shortness of breath  Objective:  Vital Signs  Vitals:   05/11/16 0856 05/11/16 0900 05/11/16 1000 05/11/16 1147  BP:  109/68 119/60 112/71  Pulse:  84 80 92  Resp:  19 14 16   Temp: 97.8 F (36.6 C) 97.6 F (36.4 C)  98.1 F (36.7 C)  TempSrc: Axillary Oral  Oral  SpO2:  100% (!) 88% 99%    Intake/Output Summary (Last 24 hours) at 05/11/16 1204 Last data filed at 05/11/16 0856  Gross per 24 hour  Intake           3411.5 ml  Output              860 ml  Net           2551.5 ml   There were no vitals filed for this visit.  General appearance: alert, cooperative, appears stated age and no distress Packing noted in the right nostril with old dried blood. Inspection of the pharyngeal area does not reveal any bleeding Resp: Diminished air entry  at the bases without any crackles, wheezing or rhonchi Cardio: regular rate and rhythm, S1, S2 normal, no murmur, click, rub or gallop GI: soft, non-tender; bowel sounds normal; no masses,  no organomegaly. Ostomy bag is present. Extremities: extremities normal, atraumatic, no cyanosis or edema Neurologic: Awake, alert. slightly distracted. No focal neurological deficits.  Lab Results:  Data Reviewed: I have personally reviewed following labs and imaging studies  CBC:  Recent Labs Lab 05/05/16 0420 05/10/16 0530 05/10/16 2119 05/11/16 0430  WBC 11.2* 14.3* 14.0* 12.2*  NEUTROABS  --  11.0*  --   --   HGB 10.2* 10.7* 8.6* 7.4*  HCT 30.8* 33.2* 26.3* 22.9*  MCV 91.4 91.7 90.4 90.5  PLT 89* 299 231 223    Basic Metabolic Panel:  Recent Labs Lab 05/05/16 0420 05/10/16 0530  NA 139 133*  K 3.8 3.8  CL 113* 101  CO2 20* 26  GLUCOSE 108* 112*  BUN 37* 9  CREATININE 1.13 0.57*  CALCIUM 7.9* 7.4*  MG 1.8  --     GFR: Estimated Creatinine Clearance: 68.7 mL/min (by C-G formula based on SCr of 0.57 mg/dL (L)).  Liver Function Tests:  Recent Labs Lab 05/10/16 0530  AST 16  ALT 9*  ALKPHOS 45  BILITOT 0.6  PROT 4.5*  ALBUMIN 2.3*     Coagulation Profile:  Recent Labs Lab 05/10/16 0530 05/11/16 0430  INR 1.71 1.24     Recent Results (from  the past 240 hour(s))  Urine culture     Status: Abnormal   Collection Time: 05/02/16  8:15 AM  Result Value Ref Range Status   Specimen Description URINE, CLEAN CATCH  Final   Special Requests NONE  Final   Culture MULTIPLE SPECIES PRESENT, SUGGEST RECOLLECTION (A)  Final   Report Status 05/03/2016 FINAL  Final  Gastrointestinal Panel by PCR , Stool     Status: None   Collection Time: 05/02/16  8:33 AM  Result Value Ref Range Status   Campylobacter species NOT DETECTED NOT DETECTED Final   Plesimonas shigelloides NOT DETECTED NOT DETECTED Final   Salmonella species NOT DETECTED NOT DETECTED Final   Yersinia  enterocolitica NOT DETECTED NOT DETECTED Final   Vibrio species NOT DETECTED NOT DETECTED Final   Vibrio cholerae NOT DETECTED NOT DETECTED Final   Enteroaggregative E coli (EAEC) NOT DETECTED NOT DETECTED Final   Enteropathogenic E coli (EPEC) NOT DETECTED NOT DETECTED Final   Enterotoxigenic E coli (ETEC) NOT DETECTED NOT DETECTED Final   Shiga like toxin producing E coli (STEC) NOT DETECTED NOT DETECTED Final   Shigella/Enteroinvasive E coli (EIEC) NOT DETECTED NOT DETECTED Final   Cryptosporidium NOT DETECTED NOT DETECTED Final   Cyclospora cayetanensis NOT DETECTED NOT DETECTED Final   Entamoeba histolytica NOT DETECTED NOT DETECTED Final   Giardia lamblia NOT DETECTED NOT DETECTED Final   Adenovirus F40/41 NOT DETECTED NOT DETECTED Final   Astrovirus NOT DETECTED NOT DETECTED Final   Norovirus GI/GII NOT DETECTED NOT DETECTED Final   Rotavirus A NOT DETECTED NOT DETECTED Final   Sapovirus (I, II, IV, and V) NOT DETECTED NOT DETECTED Final  C difficile quick scan w PCR reflex     Status: Abnormal   Collection Time: 05/02/16  8:33 AM  Result Value Ref Range Status   C Diff antigen POSITIVE (A) NEGATIVE Final   C Diff toxin NEGATIVE NEGATIVE Final   C Diff interpretation Results are indeterminate. See PCR results.  Final  Clostridium Difficile by PCR     Status: Abnormal   Collection Time: 05/02/16  8:33 AM  Result Value Ref Range Status   Toxigenic C Difficile by pcr POSITIVE (A) NEGATIVE Final    Comment: Positive for toxigenic C. difficile with little to no toxin production. Only treat if clinical presentation suggests symptomatic illness. Performed at [facility]   Blood Culture (routine x 2)     Status: None   Collection Time: 05/02/16  9:24 AM  Result Value Ref Range Status   Specimen Description BLOOD RIGHT HAND  Final   Special Requests BOTTLES DRAWN AEROBIC ONLY 5CC  Final   Culture NO GROWTH 5 DAYS  Final   Report Status 05/07/2016 FINAL  Final  Blood Culture  (routine x 2)     Status: None   Collection Time: 05/02/16  9:30 AM  Result Value Ref Range Status   Specimen Description BLOOD RIGHT ANTECUBITAL  Final   Special Requests BOTTLES DRAWN AEROBIC AND ANAEROBIC 8CC  Final   Culture NO GROWTH 5 DAYS  Final   Report Status 05/07/2016 FINAL  Final  MRSA PCR Screening     Status: Abnormal   Collection Time: 05/02/16 12:50 PM  Result Value Ref Range Status   MRSA by PCR POSITIVE (A) NEGATIVE Final    Comment:        The GeneXpert MRSA Assay (FDA approved for NASAL specimens only), is one component of a comprehensive MRSA colonization surveillance program. It  is not intended to diagnose MRSA infection nor to guide or monitor treatment for MRSA infections. RESULT CALLED TO, READ BACK BY AND VERIFIED WITH: ROSE,C ON 05/02/16 AT 2230 BY LOY,C       Radiology Studies: Dg Chest Port 1 View  Result Date: 05/10/2016 CLINICAL DATA:  82 y/o  M; hemoptysis. EXAM: PORTABLE CHEST 1 VIEW COMPARISON:  05/04/2016 chest radiograph. FINDINGS: Patient is rotated. Stable cardiac silhouette given projection and technique. Aortic atherosclerosis with arch calcification. Stable small left effusion and left basilar opacity probably represent atelectasis. Probable trace right effusion. No new focal consolidation. Two lead pacemaker. No acute osseous abnormality is identified. IMPRESSION: Stable small left and probable trace right pleural effusions with associated basilar atelectasis. No new focal consolidation. Aortic atherosclerosis. Electronically Signed   By: Mitzi Hansen M.D.   On: 05/10/2016 05:42     Medications:  Scheduled: . sodium chloride   Intravenous Once  . sodium chloride   Intravenous Once  . cefpodoxime  200 mg Oral Q12H  . multivitamin-lutein   Oral Daily  . pantoprazole  40 mg Oral BID AC  . white petrolatum       Continuous: . sodium chloride 75 mL/hr at 05/11/16 0400   ZOX:[id number] **OR** acetaminophen, ondansetron  **OR** ondansetron (ZOFRAN) IV, senna-docusate  Assessment/Plan:  Principal Problem:   Epistaxis Active Problems:   Atrial fibrillation (HCC)   Long term current use of anticoagulant therapy   Hypotension   Pressure injury of skin    Epistaxis Seems to be the primary reason for his presentation. Bleeding appears to have stopped with rhino rocket placement. His hemoglobin has dropped and he is requiring blood transfusion. His INR has been reversed with vitamin K. Continue to monitor for today. Repeat hemoglobin posttransfusion and tomorrow morning. Discussed with Dr. Lazarus Salines with ENT. He recommends keeping the packing in place for now. Holding anticoagulation for now. He would ideally like to see the patient in his office in 3-4 days. Continue antibiotics.  Acute blood loss anemia Secondary to epistaxis. He is being transfused. Monitor hemoglobin closely.  Hypotension Due to epistaxis. Patient's blood pressure improved with IV fluid boluses. Continue to monitor. Hold blood pressure lowering agents.  A Fib Currently paced, rate controlled. [place] VASC2score of at least 3. Holding coumadin given epistaxis.  Indwelling foley catheter due to BOO Continue for now  Recent hospitalization for sepsis secondary to UTI Continue antibiotics. No specific growth on urine cultures.  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 Seen by gastroenterology and general surgery, and anything hospital recently. This mass has been present previously as well. Plan is just observation for now. No evidence for obstruction. He had an NG tube during his prior hospitalization. No nausea, vomiting currently.  Anxiety Continue to monitor  History of sacral decubitus ulcer He was recently discharged from [facility] nursing [facility]. Per son this ulcer had completely healed. But the wound has gotten a bit worse during his  recent hospitalization. Wound care nurse consulted.  Non specific CT findings of bladder stone, bladder thickening, bowel mass which appears chronic and benign.  We'll defer this to PCP for monitoring with outpatient age-appropriate workup and follow-up.  Recent ARF due to severe dehydration and sepsis Resolved with hydration. Normal renal function currently.  Borderline High QTC Noted during his last hospitalization. This was discussed with cardiology at that time. Compared to previous EKGs as well and was noted  to be better than before. Okay to resume sotalol once stable.   DVT Prophylaxis: Was on warfarin currently on hold.   SCDs Code Status: Full code  Family Communication: Discussed with the patient and his son  Disposition Plan: Blood transfusion today. Monitor closely for 24 hours and if he remains stable and if hemoglobin remains stable without any further bleeding, he could be discharged tomorrow.    LOS: 0 days   [facility] ServicesKRISHNAN,Levester Waldridge  [facility] Pager [phone] 2020/12/1215, 12:04 PM  If 7PM-7AM, please contact night-coverage at [email or web address], password [email or web address]

## 2016-05-12 DIAGNOSIS — I482 Chronic atrial fibrillation: Secondary | ICD-10-CM

## 2016-05-12 LAB — TYPE AND SCREEN
ABO/RH(D): A POS
ANTIBODY SCREEN: NEGATIVE
UNIT DIVISION: 0
Unit division: 0

## 2016-05-12 LAB — CBC
HCT: 31.3 % — ABNORMAL LOW (ref 39.0–52.0)
HEMOGLOBIN: 10.4 g/dL — AB (ref 13.0–17.0)
MCH: 28.8 pg (ref 26.0–34.0)
MCHC: 33.2 g/dL (ref 30.0–36.0)
MCV: 86.7 fL (ref 78.0–100.0)
PLATELETS: 185 10*3/uL (ref 150–400)
RBC: 3.61 MIL/uL — AB (ref 4.22–5.81)
RDW: 17.3 % — ABNORMAL HIGH (ref 11.5–15.5)
WBC: 14.4 10*3/uL — ABNORMAL HIGH (ref 4.0–10.5)

## 2016-05-12 LAB — BASIC METABOLIC PANEL
Anion gap: 7 (ref 5–15)
BUN: 25 mg/dL — AB (ref 6–20)
CHLORIDE: 105 mmol/L (ref 101–111)
CO2: 25 mmol/L (ref 22–32)
Calcium: 7.6 mg/dL — ABNORMAL LOW (ref 8.9–10.3)
Creatinine, Ser: 0.66 mg/dL (ref 0.61–1.24)
GFR calc Af Amer: >60 mL/min (ref >60)
GFR calc non Af Amer: >60 mL/min (ref >60)
GLUCOSE: 95 mg/dL (ref 65–99)
POTASSIUM: 3.5 mmol/L (ref 3.5–5.1)
Sodium: 137 mmol/L (ref 135–145)

## 2016-05-12 MED ORDER — PROSIGHT PO TABS
1.0000 | ORAL_TABLET | Freq: Every day | ORAL | Status: DC
  Administered 2016-05-12 – 2016-05-13 (×2): 1 via ORAL
  Filled 2016-05-12 (×2): qty 1

## 2016-05-12 MED ORDER — SOTALOL HCL 80 MG PO TABS
160.0000 mg | ORAL_TABLET | Freq: Two times a day (BID) | ORAL | Status: DC
  Administered 2016-05-12 – 2016-05-13 (×3): 160 mg via ORAL
  Filled 2016-05-12 (×3): qty 2

## 2016-05-12 MED ORDER — HYDROCODONE-ACETAMINOPHEN 10-325 MG PO TABS
1.0000 | ORAL_TABLET | Freq: Four times a day (QID) | ORAL | Status: DC | PRN
  Administered 2016-05-13 (×2): 1 via ORAL
  Filled 2016-05-12 (×2): qty 1

## 2016-05-12 NOTE — Progress Notes (Signed)
[facility] PROGRESS NOTE  Jon CutterWilliam P [name] [id number] DOB: [birth year] DOA: 05/10/2016  PCP: Milana ObeyStephen D Knowlton, MD  Brief History/Interval Summary: 82 year old Caucasian male with past medical history of atrial fibrillation on anticoagulation, history of tachycardia-bradycardia syndrome status post pacemaker, history of essential hypertension who was recently hospitalized at [name] for sepsis due to UTI with indwelling Foley catheter. There was some concern for gastric outlet obstruction requiring NG tube placement. He responded to treatment and was discharged home. Patient presented to the emergency department with complaints of "bloody emesis", however, was noted to have epistaxis. He was found to be anemic. He was hospitalized for further management.  Reason for Visit: Epistaxis  Consultants: Phone discussion with Dr. Lazarus SalinesWolicki  Procedures: None yet  Antibiotics: Vantin  Subjective/Interval History: Patient denies any complaints this morning. Wants to go home. Denies any chest pain, shortness of breath. Hasn't had any bleeding from his nostrils.   ROS: Denies any nausea or vomiting.  Objective:  Vital Signs  Vitals:   05/11/16 2355 05/12/16 0320 05/12/16 0447 05/12/16 0737  BP: 100/69  130/82   Pulse: 91  92   Resp: 15  11   Temp: 97.3 F (36.3 C) 97 F (36.1 C)  97.7 F (36.5 C)  TempSrc: Oral Oral  Axillary  SpO2: 97%  99%   Weight:      Height:        Intake/Output Summary (Last 24 hours) at 05/12/16 0743 Last data filed at 05/11/16 2357  Gross per 24 hour  Intake           1805.5 ml  Output              550 ml  Net           1255.5 ml   Filed Weights   05/11/16 1427  Weight: 62 kg (136 lb 11 oz)    General appearance: alert, cooperative, appears stated age and no distress Packing noted in the right nostril with old dried blood. Inspection of the pharyngeal area does not reveal any bleeding Resp: Diminished air entry at the  bases without any crackles, wheezing or rhonchi Cardio: S1, S2 is irregularly irregular. Tachycardic.  GI: soft, non-tender; bowel sounds normal; no masses,  no organomegaly. Ostomy bag is present. Neurologic: Awake, alert. slightly distracted. No focal neurological deficits.  Lab Results:  Data Reviewed: I have personally reviewed following labs and imaging studies  CBC:  Recent Labs Lab 05/10/16 0530 05/10/16 2119 05/11/16 0430 05/11/16 1155 05/11/16 1631 05/12/16 0333  WBC 14.3* 14.0* 12.2* 12.2*  --  14.4*  NEUTROABS 11.0*  --   --   --   --   --   HGB 10.7* 8.6* 7.4* 9.1* 10.2* 10.4*  HCT 33.2* 26.3* 22.9* 27.6* 29.8* 31.3*  MCV 91.7 90.4 90.5 88.2  --  86.7  PLT 299 231 223 200  --  185    Basic Metabolic Panel:  Recent Labs Lab 05/10/16 0530 05/12/16 0333  NA 133* 137  K 3.8 3.5  CL 101 105  CO2 26 25  GLUCOSE 112* 95  BUN 9 25*  CREATININE 0.57* 0.66  CALCIUM 7.4* 7.6*    GFR: Estimated Creatinine Clearance: 62.4 mL/min (by C-G formula based on SCr of 0.66 mg/dL).  Liver Function Tests:  Recent Labs Lab 05/10/16 0530  AST 16  ALT 9*  ALKPHOS 45  BILITOT 0.6  PROT 4.5*  ALBUMIN 2.3*     Coagulation Profile:  Recent  Labs Lab 05/10/16 0530 05/11/16 0430  INR 1.71 1.24     Recent Results (from the past 240 hour(s))  Urine culture     Status: Abnormal   Collection Time: 05/02/16  8:15 AM  Result Value Ref Range Status   Specimen Description URINE, CLEAN CATCH  Final   Special Requests NONE  Final   Culture MULTIPLE SPECIES PRESENT, SUGGEST RECOLLECTION (A)  Final   Report Status 05/03/2016 FINAL  Final  Gastrointestinal Panel by PCR , Stool     Status: None   Collection Time: 05/02/16  8:33 AM  Result Value Ref Range Status   Campylobacter species NOT DETECTED NOT DETECTED Final   Plesimonas shigelloides NOT DETECTED NOT DETECTED Final   Salmonella species NOT DETECTED NOT DETECTED Final   Yersinia enterocolitica NOT DETECTED NOT  DETECTED Final   Vibrio species NOT DETECTED NOT DETECTED Final   Vibrio cholerae NOT DETECTED NOT DETECTED Final   Enteroaggregative E coli (EAEC) NOT DETECTED NOT DETECTED Final   Enteropathogenic E coli (EPEC) NOT DETECTED NOT DETECTED Final   Enterotoxigenic E coli (ETEC) NOT DETECTED NOT DETECTED Final   Shiga like toxin producing E coli (STEC) NOT DETECTED NOT DETECTED Final   Shigella/Enteroinvasive E coli (EIEC) NOT DETECTED NOT DETECTED Final   Cryptosporidium NOT DETECTED NOT DETECTED Final   Cyclospora cayetanensis NOT DETECTED NOT DETECTED Final   Entamoeba histolytica NOT DETECTED NOT DETECTED Final   Giardia lamblia NOT DETECTED NOT DETECTED Final   Adenovirus F40/41 NOT DETECTED NOT DETECTED Final   Astrovirus NOT DETECTED NOT DETECTED Final   Norovirus GI/GII NOT DETECTED NOT DETECTED Final   Rotavirus A NOT DETECTED NOT DETECTED Final   Sapovirus (I, II, IV, and V) NOT DETECTED NOT DETECTED Final  C difficile quick scan w PCR reflex     Status: Abnormal   Collection Time: 05/02/16  8:33 AM  Result Value Ref Range Status   C Diff antigen POSITIVE (A) NEGATIVE Final   C Diff toxin NEGATIVE NEGATIVE Final   C Diff interpretation Results are indeterminate. See PCR results.  Final  Clostridium Difficile by PCR     Status: Abnormal   Collection Time: 05/02/16  8:33 AM  Result Value Ref Range Status   Toxigenic C Difficile by pcr POSITIVE (A) NEGATIVE Final    Comment: Positive for toxigenic C. difficile with little to no toxin production. Only treat if clinical presentation suggests symptomatic illness. [facility]   Blood Culture (routine x 2)     Status: None   Collection Time: 05/02/16  9:24 AM  Result Value Ref Range Status   Specimen Description BLOOD RIGHT HAND  Final   Special Requests BOTTLES DRAWN AEROBIC ONLY 5CC  Final   Culture NO GROWTH 5 DAYS  Final   Report Status 05/07/2016 FINAL  Final  Blood Culture (routine x 2)     Status: None     Collection Time: 05/02/16  9:30 AM  Result Value Ref Range Status   Specimen Description BLOOD RIGHT ANTECUBITAL  Final   Special Requests BOTTLES DRAWN AEROBIC AND ANAEROBIC 8CC  Final   Culture NO GROWTH 5 DAYS  Final   Report Status 05/07/2016 FINAL  Final  MRSA PCR Screening     Status: Abnormal   Collection Time: 05/02/16 12:50 PM  Result Value Ref Range Status   MRSA by PCR POSITIVE (A) NEGATIVE Final    Comment:        The GeneXpert MRSA  Assay (FDA approved for NASAL specimens only), is one component of a comprehensive MRSA colonization surveillance program. It is not intended to diagnose MRSA infection nor to guide or monitor treatment for MRSA infections. RESULT CALLED TO, READ BACK BY AND VERIFIED WITH: ROSE,C ON 05/02/16 AT 2230 BY LOY,C       Radiology Studies: No results found.   Medications:  Scheduled: . sodium chloride   Intravenous Once  . sodium chloride   Intravenous Once  . cefpodoxime  200 mg Oral Q12H  . feeding supplement (ENSURE ENLIVE)  237 mL Oral TID BM  . multivitamin-lutein   Oral Daily  . pantoprazole  40 mg Oral BID AC  . sotalol  160 mg Oral BID   Continuous: . sodium chloride 75 mL/hr at 05/11/16 1431   ZOX:[id number] **OR** acetaminophen, ondansetron **OR** ondansetron (ZOFRAN) IV, senna-docusate  Assessment/Plan:  Principal Problem:   Epistaxis Active Problems:   Atrial fibrillation (HCC)   Long term current use of anticoagulant therapy   Hypotension   Pressure injury of skin    Epistaxis Seems to be the primary reason for his presentation. Likely precipitated by recent NG tube placement while at [facility]. Bleeding appears to have stopped with rhino rocket placement. His hemoglobin has dropped and he required blood transfusion. His INR has been reversed with vitamin K. Hemoglobin is stable. Discussed with Dr. Lazarus Salines with ENT. He recommends keeping the packing in place for now. Holding anticoagulation for  now. He would ideally like to see the patient in his office in 3-4 days. Continue antibiotics.  A Fib with RVR Heart rate is poorly controlled this morning. Resume sotalol. [place] VASC2score of at least 3. Holding coumadin given epistaxis. May need intravenous metoprolol. Patient does have a pacemaker.  Acute blood loss anemia Secondary to epistaxis. He was transfused. Hemoglobin is stable.   Hypotension Due to epistaxis. Patient's blood pressure improved with IV fluid boluses. Continue to monitor. Hold blood pressure lowering agents.  Indwelling foley catheter due to BOO Continue for now  Recent hospitalization for sepsis secondary to UTI Continue antibiotics. No specific growth on urine cultures.  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 Seen by gastroenterology and general surgery, and anything hospital recently. This mass has been present previously as well. Plan is just observation for now. No evidence for obstruction. He had an NG tube during his prior hospitalization. No nausea, vomiting currently.  Anxiety Continue to monitor  History of sacral decubitus ulcer He was recently discharged from [facility] nursing [facility]. Per son this ulcer had completely healed. But the wound has gotten a bit worse during his recent hospitalization. Wound care nurse consulted.  Non specific CT findings of bladder stone, bladder thickening, bowel mass which appears chronic and benign.  We'll defer this to PCP for monitoring with outpatient age-appropriate workup and follow-up.  Recent ARF due to severe dehydration and sepsis Resolved with hydration. Normal renal function currently.  Borderline High QTC Noted during his last hospitalization. This was discussed with cardiology at that time. Compared to previous EKGs as well and was noted to be better than before.   DVT Prophylaxis: Was on warfarin  currently on hold.   SCDs Code Status: Full code  Family Communication: Discussed with the patient. Unable to contact son. Disposition Plan: Heart rate noted to be elevated today. Resume sotalol. Patient apparently has been bed bound for last 2 years. He  will need to be transported by EMS when he is discharged home. Unable to reach Son today. Would like for his heart rate to be better controlled before discharge.     LOS: 1 day   [employer]  [facility] Pager [phone] 05/12/2016, 7:43 AM  If 7PM-7AM, please contact night-coverage at [email or web address], password [facility]

## 2016-05-12 NOTE — Progress Notes (Signed)
Upon assessment  Pt was noted to have a rhinorocket in right nare that appeared to be dislodged . dayshift nurse troyce and mikela (troyce who admited the patient and Mikeala who cared for the patient during previous shift) explained that this was no change from patients intial presentation , md was aware - and the rhinorocket would be removed by Monday as the patients h/h stablized and there was no evidence of active bleeding . The patient does not appear to be in any distress - infact he denies pain or recent bleeding from nose or mouth.

## 2016-05-12 NOTE — Progress Notes (Signed)
OT Cancellation Note  Patient Details Name: Jon CutterWilliam P [name] MRN: [id number] DOB: 04-Oct-1931   Cancelled Treatment:    Reason Eval/Treat Not Completed: OT screened, no needs identified, will sign off (Pt bedbound and dependent in ADL at baseline.)  Evern BioMayberry, Tiny Chaudhary Lynn 05/12/2016, 12:56 PM

## 2016-05-12 NOTE — Plan of Care (Signed)
Problem: Safety: Goal: Ability to remain free from injury will improve Outcome: Progressing Pt to call for assistance whenever he needs it, bed alarm on, call light within reach.

## 2016-05-12 NOTE — Plan of Care (Signed)
Problem: Nutrition: Goal: Adequate nutrition will be maintained Outcome: Not Progressing Pt  Does not want to eat - he has asked staff to give his tray to another patient . However , he will drink water and ensure

## 2016-05-12 NOTE — Progress Notes (Signed)
PT Cancellation Note  Patient Details Name: Jon CutterWilliam P Carrasco MRN: [id number] DOB: 1931-11-14   Cancelled Treatment:    Reason Eval/Treat Not Completed: PT screened, no needs identified, will sign off. [name] pt has been bed bound for 2+ years.   Angelina OkCary W Maycok 05/12/2016, 8:30 AM [facility] Mikahla Wisor PT [phone]

## 2016-05-13 DIAGNOSIS — L89154 Pressure ulcer of sacral region, stage 4: Secondary | ICD-10-CM

## 2016-05-13 LAB — GLUCOSE, CAPILLARY: GLUCOSE-CAPILLARY: 96 mg/dL (ref 65–99)

## 2016-05-13 NOTE — Progress Notes (Signed)
[facility] PROGRESS NOTE  Jon CutterWilliam P [name] ZOX:[id number] DOB: 1931/10/14 DOA: 05/10/2016  PCP: Milana ObeyStephen D Knowlton, MD  Brief History/Interval Summary: 82 year old Caucasian male with past medical history of atrial fibrillation on anticoagulation, history of tachycardia-bradycardia syndrome status post pacemaker, history of essential hypertension who was recently hospitalized at [name] for sepsis due to UTI with indwelling Foley catheter. There was some concern for gastric outlet obstruction requiring NG tube placement. He responded to treatment and was discharged home. Patient presented to the emergency department with complaints of "bloody emesis", however, was noted to have epistaxis. He was found to be anemic. He was hospitalized for further management.  Reason for Visit: Epistaxis  Consultants: Phone discussion with Dr. Lazarus SalinesWolicki  Procedures: None yet  Antibiotics: Vantin  Subjective/Interval History: No bleeding, denies any other complaints. Heart rate is controlled today. Continue antibiotics as long he has the rhino rocket in place.  ROS: Denies any nausea or vomiting.  Objective:  Vital Signs  Vitals:   05/13/16 0400 05/13/16 0428 05/13/16 0600 05/13/16 0812  BP: 125/77  115/74 (!) 130/113  Pulse: 72 74 (!) 161 79  Resp: 12 16 13  (!) 21  Temp: 97.3 F (36.3 C)   97.4 F (36.3 C)  TempSrc: Oral     SpO2: 99% 100% 98% 98%  Weight:      Height:        Intake/Output Summary (Last 24 hours) at 05/13/16 1115 Last data filed at 05/13/16 1042  Gross per 24 hour  Intake             2474 ml  Output             1280 ml  Net             1194 ml   Filed Weights   05/11/16 1427  Weight: 62 kg (136 lb 11 oz)    General appearance: alert, cooperative, appears stated age and no distress Packing noted in the right nostril with old dried blood. Inspection of the pharyngeal area does not reveal any bleeding Resp: Diminished air entry at the bases  without any crackles, wheezing or rhonchi Cardio: S1, S2 is irregularly irregular. Tachycardic.  GI: soft, non-tender; bowel sounds normal; no masses,  no organomegaly. Ostomy bag is present. Neurologic: Awake, alert. slightly distracted. No focal neurological deficits.  Lab Results:  Data Reviewed: I have personally reviewed following labs and imaging studies  CBC:  Recent Labs Lab 05/10/16 0530 05/10/16 2119 05/11/16 0430 05/11/16 1155 05/11/16 1631 05/12/16 0333  WBC 14.3* 14.0* 12.2* 12.2*  --  14.4*  NEUTROABS 11.0*  --   --   --   --   --   HGB 10.7* 8.6* 7.4* 9.1* 10.2* 10.4*  HCT 33.2* 26.3* 22.9* 27.6* 29.8* 31.3*  MCV 91.7 90.4 90.5 88.2  --  86.7  PLT 299 231 223 200  --  185    Basic Metabolic Panel:  Recent Labs Lab 05/10/16 0530 05/12/16 0333  NA 133* 137  K 3.8 3.5  CL 101 105  CO2 26 25  GLUCOSE 112* 95  BUN 9 25*  CREATININE 0.57* 0.66  CALCIUM 7.4* 7.6*    GFR: Estimated Creatinine Clearance: 62.4 mL/min (by C-G formula based on SCr of 0.66 mg/dL).  Liver Function Tests:  Recent Labs Lab 05/10/16 0530  AST 16  ALT 9*  ALKPHOS 45  BILITOT 0.6  PROT 4.5*  ALBUMIN 2.3*     Coagulation Profile:  Recent  Labs Lab 05/10/16 0530 05/11/16 0430  INR 1.71 1.24     No results found for this or any previous visit (from the past 240 hour(s)).    Radiology Studies: No results found.   Medications:  Scheduled: . sodium chloride   Intravenous Once  . sodium chloride   Intravenous Once  . cefpodoxime  200 mg Oral Q12H  . feeding supplement (ENSURE ENLIVE)  237 mL Oral TID BM  . multivitamin  1 tablet Oral Daily  . pantoprazole  40 mg Oral BID AC  . sotalol  160 mg Oral BID   Continuous: . sodium chloride 75 mL/hr at 05/13/16 0524   ZOX:[id number]:acetaminophen **OR** acetaminophen, HYDROcodone-acetaminophen, ondansetron **OR** ondansetron (ZOFRAN) IV, senna-docusate  Assessment/Plan:  Principal Problem:   Epistaxis Active  Problems:   Atrial fibrillation (HCC)   Long term current use of anticoagulant therapy   Hypotension   Pressure injury of skin    Epistaxis Seems to be the primary reason for his presentation. Likely precipitated by recent NG tube placement while at [name]. Bleeding appears to have stopped with rhino rocket placement. His hemoglobin has dropped and he required blood transfusion. His INR has been reversed with vitamin K. Hemoglobin is stable. Discussed with Dr. Lazarus SalinesWolicki with ENT. He recommends keeping the packing in place for now. Holding anticoagulation for now. He would ideally like to see the patient in his office in 3-4 days. Continue antibiotics.  A Fib with RVR Heart rate is poorly controlled this morning. Resume sotalol. [place] VASC2score of at least 3.  Continue to hold Coumadin given his concurrent epistaxis. His heart rate is controlled after sotalol restarted.  Acute blood loss anemia Secondary to epistaxis. He was transfused. Hemoglobin is stable.   Hypotension Due to epistaxis. Patient's blood pressure improved with IV fluid boluses. Continue to monitor. Hold blood pressure lowering agents.  Indwelling foley catheter due to BOO Continue for now  Recent hospitalization for sepsis secondary to UTI Continue antibiotics. No specific growth on urine cultures.  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 Seen by gastroenterology and general surgery, and anything hospital recently. This mass has been present previously as well. Plan is just observation for now. No evidence for obstruction. He had an NG tube during his prior hospitalization. No nausea, vomiting currently.  Anxiety Continue to monitor  Stage IV sacral decubitus ulcer He was recently discharged from [facility] nursing home. Per son this ulcer had completely healed.  WOC evaluated the patient showed stage IV  ulcer with exposed bone, this is present on admission  Non specific CT findings of bladder stone, bladder thickening, bowel mass which appears chronic and benign.  We'll defer this to PCP for monitoring with outpatient age-appropriate workup and follow-up.  Recent ARF due to severe dehydration and sepsis Resolved with hydration. Normal renal function currently.  Borderline High QTC Noted during his last hospitalization. This was discussed with cardiology at that time. Compared to previous EKGs as well and was noted to be better than before.   DVT Prophylaxis: Was on warfarin currently on hold.   SCDs Code Status: Full code  Family Communication: Discussed with the patient. Unable to contact son. Disposition Plan: To SNF versus home   LOS: 2 days   [facility] DaughtersELMAHI,Bridget Westbrooks A  [facility] Pager [phone] 05/13/2016, 11:15 AM  If 7PM-7AM, please contact night-coverage at [email or web address], password [email or web address]

## 2016-05-13 NOTE — Progress Notes (Addendum)
Went over all d/c instructions with pt and given discharge paperwork  Pt aware he is not to restart coumsdin for 1 week and has follow up appointment with ENT to remove Rhino rocket from Rt nares - upon this shift assessment it is approx 1/2 way out of Rt nare. Dr Arthor CaptainElmahi aware No active bleeding noted

## 2016-05-13 NOTE — Discharge Summary (Signed)
Physician Discharge Summary  Jon CutterWilliam P [name] ZOX:[id number] DOB: [birth year] DOA: 05/10/2016  PCP: Milana ObeyStephen D Knowlton, MD  Admit date: 05/10/2016 Discharge date: 05/13/2016  Admitted From: Home Disposition: Home  Recommendations for Outpatient Follow-up:  1. Follow up with PCP in 1-2 weeks 2. Please obtain BMP/CBC in one week. 3. Follow-up with Dr. Lazarus SalinesWolicki for removal of the [employer] in 2-3 days. 4. Restart Coumadin in 1 week.  Home Health: NA Equipment/Devices:NA  Discharge Condition: Stable CODE STATUS: Full Code Diet recommendation: DIET SOFT Room service appropriate? Yes; Fluid consistency: Thin Diet - low sodium heart healthy  Brief/Interim Summary: 82 year old Caucasian male with past medical history of atrial fibrillation on anticoagulation, history of tachycardia-bradycardia syndrome status post pacemaker, history of essential hypertension who was recently hospitalized at [name] for sepsis due to UTI with indwelling Foley catheter. There was some concern for gastric outlet obstruction requiring NG tube placement. He responded to treatment and was discharged home. Patient presented to the emergency department with complaints of "bloody emesis", however, was noted to have epistaxis. He was found to be anemic. He was hospitalized for further management.  Discharge Diagnoses:  Principal Problem:   Epistaxis Active Problems:   Atrial fibrillation (HCC)   Long term current use of anticoagulant therapy   Hypotension   Sacral decubitus ulcer, stage IV (HCC)   Epistaxis Seems to be the primary reason for his presentation. Likely precipitated by recent NG tube placement while at [name]. Bleeding appears to have stopped with rhino rocket placement. His hemoglobin has dropped and he required blood transfusion. His INR has been reversed with vitamin K. Hemoglobin is stable.  Discussed with Dr. Lazarus SalinesWolicki with ENT. He recommends keeping the packing in  place for now.  Holding anticoagulation for now.  Follow-up with ENT in 3 days for removal of the [employer] Rocket.  A Fib with RVR Heart rate is poorly controlled this morning. Resume sotalol. [place] VASC2score of at least 3.  Continue to hold Coumadin given his concurrent epistaxis. His heart rate is controlled after sotalol restarted.  Acute blood loss anemia Secondary to epistaxis. He required transfusion of 2 units of packed RBCs. Hemoglobin is stable at 10.4 on discharge.  Hypotension Due to epistaxis. Patient's blood pressure improved with IV fluid boluses. This is resolved.  Indwelling foley catheter due to BOO Continue for now  Recent hospitalization for sepsis secondary to UTI Continue antibiotics. No specific growth on urine cultures.  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 Seen by gastroenterology and general surgery, and anything hospital recently. This mass has been present previously as well. Plan is just observation for now. No evidence for obstruction. He had an NG tube during his prior hospitalization. No nausea, vomiting currently.  Anxiety Continue to monitor  Stage IV sacral decubitus ulcer He was recently discharged from [facility] nursing home. Per son this ulcer had completely healed.  WOC evaluated the patient showed stage IV ulcer with exposed bone, this is present on admission.  Non specific CT findings of bladder stone, bladder thickening, bowel mass which appears chronic and benign.  We'll defer this to PCP for monitoring with outpatient age-appropriate workup and follow-up.  Recent ARF due to severe dehydration and sepsis Resolved with hydration. Normal renal function currently.  Borderline High QTC Noted during his last hospitalization. This was discussed with cardiology at that time. Compared to previous EKGs as well and was  noted to be better  than before.   Disposition -Patient appeared to have less than appropriate care at home. -After his sacral decubitus ulcer healed now he has stage IV sacral decubitus again. -Recent admission to the hospital with acute renal failure secondary to severe dehydration. -My goal was to get him to skilled nursing facility for the ulcer to heal, his family refused they want to be discharged home.  Discharge Instructions  Discharge Instructions    Diet - low sodium heart healthy    Complete by:  As directed    Increase activity slowly    Complete by:  As directed        Medication List    TAKE these medications   ALPRAZolam 1 MG tablet Commonly known as:  XANAX Take 0.5 tablets (0.5 mg total) by mouth 2 (two) times daily as needed for anxiety. What changed:  when to take this   cefpodoxime 200 MG tablet Commonly known as:  VANTIN Take 1 tablet (200 mg total) by mouth every 12 (twelve) hours.   diphenhydrAMINE 25 MG tablet Commonly known as:  BENADRYL Take 25 mg by mouth every 6 (six) hours as needed for itching.   EYE DROPS OP Apply 2 drops to eye daily as needed (irritation).   feeding supplement (PRO-STAT SUGAR FREE 64) Liqd Take 30 mLs by mouth 3 (three) times daily with meals.   HYDROcodone-acetaminophen 10-325 MG tablet Commonly known as:  NORCO Take 1 tablet by mouth every 6 (six) hours as needed for moderate pain.   ICAPS AREDS FORMULA PO Take 1 tablet by mouth daily.   nystatin powder Commonly known as:  MYCOSTATIN/NYSTOP Apply 1 application topically daily.   pantoprazole 40 MG tablet Commonly known as:  PROTONIX Take 1 tablet (40 mg total) by mouth 2 (two) times daily before a meal.   potassium chloride SA 20 MEQ tablet Commonly known as:  K-DUR,KLOR-CON Take 1 tablet by mouth 2 (two) times daily.   sotalol 160 MG tablet Commonly known as:  BETAPACE Take 1 tablet (160 mg total) by mouth 2 (two) times daily. Need appointment for future refills    warfarin 2.5 MG tablet Commonly known as:  COUMADIN Take 1 tablet (2.5 mg total) by mouth daily.       Allergies  Allergen Reactions  . Iohexol      Desc: PT STATES THROAT/FACE SWELLING W/ IVP DYE IN 1990. PT HAS NOT HAD ANY EXAMS DONE W/DYE SINCE AND HAS NEVER BEEN PRE MEDICATED. PER PT, Onset Date: [id number]   . Shellfish Allergy   . Dilantin [Phenytoin Sodium Extended] Rash  . Tegretol [Carbamazepine] Rash    Consultations:  None   Procedures (Echo, Carotid, EGD, Colonoscopy, ERCP)   Radiological studies: Ct Abdomen Pelvis Wo Contrast  Result Date: 05/03/2016 CLINICAL DATA:  Abdominal pain, diarrhea, sepsis, low-grade fever. EXAM: CT ABDOMEN AND PELVIS WITHOUT CONTRAST TECHNIQUE: Multidetector CT imaging of the abdomen and pelvis was performed following the standard protocol without IV contrast. COMPARISON:  CT scan 05/02/2016 FINDINGS: Lower chest: Interval development of small bilateral pleural effusions and overlying bibasilar atelectasis. Hepatobiliary: No focal hepatic lesions or intrahepatic biliary dilatation. Numerous layering gallstones are again noted in the gallbladder. No common bile duct dilatation. Pancreas: No mass, inflammation or ductal dilatation. Mild pancreatic atrophy. Spleen: Normal size.  No focal lesions. Adrenals/Urinary Tract: Mild nodularity of both adrenal glands is stable. There are numerous bilateral simple and hemorrhagic cysts. No new lesions. Extensive renal artery calcifications and small renal  calculi. No obstructing ureteral calculi or hydronephrosis. Numerous bladder calculi are again demonstrated. Thick walled bladder containing a Foley catheter. Stomach/Bowel: The stomach is moderately distended. There is a persistent 3 cm soft tissue mass along the anti mesenteric border of the descending duodenum. This could be a benign gist tumor. It could be causing partial gastric outlet obstruction. The duodenum, small bowel and colon are otherwise  unremarkable. No inflammatory changes, mass lesions or obstructive findings. Stable left lower quadrant colostomy. No complicating features. Small parastomal hernia. Vascular/Lymphatic: Stable advanced atherosclerotic calcifications involving the aorta. No mesenteric or retroperitoneal mass or adenopathy. Reproductive: The prostate gland and seminal vesicles are grossly normal. Other: No pelvic mass or lymphadenopathy. Small amount of free pelvic fluid. No inguinal mass or adenopathy. Musculoskeletal: Scarring changes from previous decubitus ulcers. I do not see an obvious ulcer or definite changes of osteomyelitis. Diffuse osteoporosis. IMPRESSION: 1. New small bilateral pleural effusions with overlying atelectasis. 2. Distended stomach suggesting gastroparesis or partial gastric outlet obstruction. There is a persistent 3 cm duodenal or periduodenal mass. 3. Numerous renal cysts are stable. Small renal calculi but no obstructing ureteral calculi. 4. Numerous stable bladder calculi and diffusely thick walled bladder. 5. Cholelithiasis. 6. Left lower quadrant colostomy without complicating features. 7. Stable advanced atherosclerotic calcifications involving the aorta and branch vessels. Electronically Signed   By: Rudie Meyer M.D.   On: 05/03/2016 16:12   Ct Abdomen Pelvis Wo Contrast  Result Date: 05/02/2016 CLINICAL DATA:  Generalized abdominal pain and diarrhea for several days. EXAM: CT ABDOMEN AND PELVIS WITHOUT CONTRAST TECHNIQUE: Multidetector CT imaging of the abdomen and pelvis was performed following the standard protocol without IV contrast. COMPARISON:  06/15/2014 and chest CT on 05/06/2006 FINDINGS: Lower chest: No acute findings. Hepatobiliary: No masses visualized on this unenhanced exam. Multiple calcified gallstones are again seen, without evidence of acute cholecystitis or biliary ductal dilatation. Pancreas:  Phone Spleen:  Within normal limits in size. Adrenals/Urinary tract: Multiple  simple and hemorrhagic renal cysts are again seen bilaterally which are stable. Largest cyst arising from the lateral lower pole the left kidney extends inferiorly into the right lower quadrant measures 15 cm. These remain stable. Numerous small 5 mm calculi are seen within the right kidney and renal collecting system including the renal pelvis. There is no evidence hydronephrosis. No evidence of ureteral calculi or dilatation. Foley catheter is seen in place with decompressed urinary bladder. Diffuse bladder wall thickening is consistent with chronic bladder outlet obstruction. Multiple calculi are seen within the urinary bladder which have increased in size and number since previous study. Largest calculus measures 2.1 cm. Stomach/Bowel: No evidence of obstruction, inflammatory process, or abnormal fluid collections. Left lower quadrant colostomy noted. A right upper quadrant soft tissue mass is seen which abuts the proximal duodenum. This mass is homogeneous and shows mild peripheral calcification. This measures 2.8 x 3.2 cm on image 26/2 and shows minimal increase in size compared to previous chest CT dating back to 05/06/2006 when it measured 2.4 x 2.7 cm. Vascular/Lymphatic: No pathologically enlarged lymph nodes identified. No evidence of abdominal aortic aneurysm. Aortic atherosclerosis. Reproductive:  Mildly enlarged prostate gland. Other:  None. Musculoskeletal: No suspicious bone lesions identified. Advanced lumbar degenerative spondylosis and bilateral hip osteoarthritis noted. IMPRESSION: No acute findings within the abdomen or pelvis. Increased number and size of bladder calculi measuring up to 2.1 cm. Diffuse bladder wall thickening, consistent with chronic bladder outlet obstruction. Urinary bladder is decompressed by Foley catheter. Stable mildly enlarged prostate.  Multiple tiny nonobstructive right renal calculi. No evidence of ureteral calculi or hydronephrosis. Stable bilateral renal cysts,  largest in left kidney measuring 15 cm. Cholelithiasis.  No radiographic evidence of cholecystitis. Minimal increase in size of 3.2 cm right upper quadrant mass abutting the proximal duodenum compared to previous studies dating back to 2007. This is likely benign in etiology, and differential diagnosis includes leiomyoma and GI stromal tumor. Consider endoscopic ultrasound or continued attention on follow-up imaging. Electronically Signed   By: Myles Rosenthal M.D.   On: 05/02/2016 10:43   Dg Chest Port 1 View  Result Date: 05/10/2016 CLINICAL DATA:  82 y/o  M; hemoptysis. EXAM: PORTABLE CHEST 1 VIEW COMPARISON:  05/04/2016 chest radiograph. FINDINGS: Patient is rotated. Stable cardiac silhouette given projection and technique. Aortic atherosclerosis with arch calcification. Stable small left effusion and left basilar opacity probably represent atelectasis. Probable trace right effusion. No new focal consolidation. Two lead pacemaker. No acute osseous abnormality is identified. IMPRESSION: Stable small left and probable trace right pleural effusions with associated basilar atelectasis. No new focal consolidation. Aortic atherosclerosis. Electronically Signed   By: Mitzi Hansen M.D.   On: 05/10/2016 05:42   Dg Chest Port 1 View  Result Date: 05/04/2016 CLINICAL DATA:  Shortness of Breath EXAM: PORTABLE CHEST 1 VIEW COMPARISON:  05/02/2016 FINDINGS: Small left pleural effusion with left base atelectasis. Heart is normal size. Left pacer remains in place, unchanged. Right lung is clear. Underlying COPD. IMPRESSION: Small left pleural effusion with left base atelectasis. COPD. Electronically Signed   By: Charlett Nose M.D.   On: 05/04/2016 08:22   Dg Chest Port 1 View  Result Date: 05/02/2016 CLINICAL DATA:  Generalized weakness. EXAM: PORTABLE CHEST 1 VIEW COMPARISON:  05/15/2015 FINDINGS: Stable aortic tortuosity. The heart size is normal. Stable appearance of dual-chamber pacemaker There is no  evidence of pulmonary edema, consolidation, pneumothorax, nodule or pleural fluid. The visualized skeletal structures are unremarkable. IMPRESSION: No active disease. Electronically Signed   By: Irish Lack M.D.   On: 05/02/2016 09:09   Dg Abd Portable 1v  Result Date: 05/04/2016 CLINICAL DATA:  NG tube placement EXAM: PORTABLE ABDOMEN - 1 VIEW COMPARISON:  05/03/2016 FINDINGS: NG tube has been placed with the tip in the stomach. Decreasing gaseous distention of the stomach. No evidence for bowel obstruction, free air organomegaly. IMPRESSION: NG tube tip in the stomach which is decompressed. Electronically Signed   By: Charlett Nose M.D.   On: 05/04/2016 08:21   Dg Abd Portable 1v  Result Date: 05/03/2016 CLINICAL DATA:  Nausea and abdominal pain. EXAM: PORTABLE ABDOMEN - 1 VIEW COMPARISON:  05/02/2016 FINDINGS: Gaseous distention of the stomach. No significant small bowel gaseous distension. Degenerative changes in lower lumbar spine with scoliosis. Again noted is a Foley catheter. Multiple calcifications in the pelvis and some of these represent bladder calculi based on the prior CT. There is an ostomy in the left lower abdomen. Moderate joint space narrowing in the right hip. IMPRESSION: Gaseous distension of the stomach. Foley catheter with bladder calcifications as described. Electronically Signed   By: Richarda Overlie M.D.   On: 05/03/2016 11:42     Subjective:  Discharge Exam: Vitals:   05/13/16 0400 05/13/16 0428 05/13/16 0600 05/13/16 0812  BP: 125/77  115/74 (!) 130/113  Pulse: 72 74 (!) 161 79  Resp: 12 16 13  (!) 21  Temp: 97.3 F (36.3 C)   97.4 F (36.3 C)  TempSrc: Oral     SpO2: 99% 100%  98% 98%  Weight:      Height:       General: Pt is alert, awake, not in acute distress Cardiovascular: RRR, S1/S2 +, no rubs, no gallops Respiratory: CTA bilaterally, no wheezing, no rhonchi Abdominal: Soft, NT, ND, bowel sounds + Extremities: no edema, no cyanosis   The results of  significant diagnostics from this hospitalization (including imaging, microbiology, ancillary and laboratory) are listed below for reference.    Microbiology: No results found for this or any previous visit (from the past 240 hour(s)).   Labs: BNP (last 3 results) No results for input(s): BNP in the last 8760 hours. Basic Metabolic Panel:  Recent Labs Lab 05/10/16 0530 05/12/16 0333  NA 133* 137  K 3.8 3.5  CL 101 105  CO2 26 25  GLUCOSE 112* 95  BUN 9 25*  CREATININE 0.57* 0.66  CALCIUM 7.4* 7.6*   Liver Function Tests:  Recent Labs Lab 05/10/16 0530  AST 16  ALT 9*  ALKPHOS 45  BILITOT 0.6  PROT 4.5*  ALBUMIN 2.3*   No results for input(s): LIPASE, AMYLASE in the last 168 hours. No results for input(s): AMMONIA in the last 168 hours. CBC:  Recent Labs Lab 05/10/16 0530 05/10/16 2119 05/11/16 0430 05/11/16 1155 05/11/16 1631 05/12/16 0333  WBC 14.3* 14.0* 12.2* 12.2*  --  14.4*  NEUTROABS 11.0*  --   --   --   --   --   HGB 10.7* 8.6* 7.4* 9.1* 10.2* 10.4*  HCT 33.2* 26.3* 22.9* 27.6* 29.8* 31.3*  MCV 91.7 90.4 90.5 88.2  --  86.7  PLT 299 231 223 200  --  185   Cardiac Enzymes: No results for input(s): CKTOTAL, CKMB, CKMBINDEX, TROPONINI in the last 168 hours. BNP: Invalid input(s): POCBNP CBG:  Recent Labs Lab 05/13/16 0749  GLUCAP 96   D-Dimer No results for input(s): DDIMER in the last 72 hours. Hgb A1c No results for input(s): HGBA1C in the last 72 hours. Lipid Profile No results for input(s): CHOL, HDL, LDLCALC, TRIG, CHOLHDL, LDLDIRECT in the last 72 hours. Thyroid function studies No results for input(s): TSH, T4TOTAL, T3FREE, THYROIDAB in the last 72 hours.  Invalid input(s): FREET3 Anemia work up No results for input(s): VITAMINB12, FOLATE, FERRITIN, TIBC, IRON, RETICCTPCT in the last 72 hours. Urinalysis    Component Value Date/Time   COLORURINE YELLOW 05/02/2016 0815   APPEARANCEUR CLOUDY (A) 05/02/2016 0815   LABSPEC  <1.005 (L) 05/02/2016 0815   PHURINE 7.5 05/02/2016 0815   GLUCOSEU NEGATIVE 05/02/2016 0815   HGBUR LARGE (A) 05/02/2016 0815   BILIRUBINUR NEGATIVE 05/02/2016 0815   KETONESUR NEGATIVE 05/02/2016 0815   PROTEINUR 30 (A) 05/02/2016 0815   UROBILINOGEN 0.2 03/14/2015 1515   NITRITE NEGATIVE 05/02/2016 0815   LEUKOCYTESUR LARGE (A) 05/02/2016 0815   Sepsis Labs Invalid input(s): PROCALCITONIN,  WBC,  LACTICIDVEN Microbiology No results found for this or any previous visit (from the past 240 hour(s)).   Time coordinating discharge: Over 30 minutes  SIGNED:   Clint Lipps, MD  [facility] 05/13/2016, 11:27 AM Pager   If 7PM-7AM, please contact night-coverage [email or web address] Password [email or web address]

## 2016-05-13 NOTE — Progress Notes (Signed)
Pt discharged per [facility] PT has his prevelon boots and D/C instructions in hand. Aware he is not to take Coumadin for 1 week. Has follow up appointment scheduled to get his Rhino rocket out.

## 2016-05-13 NOTE — Progress Notes (Signed)
Called son at home to make aware that Dr appointment to see ENT in [place] - Pt lives in [place] and son wants him to see ENT there since he gets transported per EMS - Son will call the office and try to change the appointment. Also made aware that his dad is not to get Coumadin for 1 week.

## 2016-05-13 NOTE — Care Management Note (Signed)
Case Management Note  Patient Details  Name: Jon [name] MRN: [id number] Date of Birth: 30-Sep-1931  Subjective/Objective:  Pt lives with son who is primary caregiver.  Per son, pt is currently bed-bound and has hospital bed with air loss mattress and w/c, currently does not need other equipment.  Home health services consisting of PT and RN that were arranged upon prevous discharge were just starting and  they will resume.  Ambulance transport will be arranged.               Expected Discharge Plan:  Home w Home Health Services  Discharge planning Services  CM Consult  Post Acute Care Choice:  Resumption of Svcs/PTA Provider  [facility]  [facility]  Status of Service:  Completed, signed off  Magdalene RiverMayo, Sindee Stucker T, [place]RN 05/13/2016, 1:09 PM

## 2016-05-13 NOTE — Consult Note (Signed)
WOC Nurse wound consult note Reason for Consult: Consult requested for sacrum wound Wound type: Chronic stage 4 pressure injury Pressure Ulcer POA: Yes Measurement: 3X1X.2cm Wound bed: 85% red, 15% exposed bone Drainage (amount, consistency, odor) Small amt yellow drainage, no odor Periwound: Intact skin surrounding Dressing procedure/placement/frequency: Foam dressing to protect and promote healing.   Pt has an ostomy pouch which is intact with good seal, mod amt stool in the pouch.  Extra supplies left in the room for staff nurse use. Please re-consult if further assistance is needed.  Thank-you,  Cammie Mcgeeawn Raylin Diguglielmo MSN, RN, CWOCN, [place]WCN-AP, CNS [phone]

## 2016-05-13 NOTE — Progress Notes (Signed)
CSW informed of possible concerns with pt return home and possible need for SNF. CSW spoke with pt son, Tawanna Coolerodd, who states plan is for pt to return home and he is not interested in pursuing a SNF at this time.  Son states pt [facility] and will need [facility] transport home.  RNCM and MD updated on decision for pt to return home.  CSW signing off  Burna SisJenna H. Alura Olveda, LCSW Clinical Social Worker [phone]

## 2016-05-21 ENCOUNTER — Telehealth: Payer: Self-pay | Admitting: Cardiology

## 2016-05-21 ENCOUNTER — Ambulatory Visit (INDEPENDENT_AMBULATORY_CARE_PROVIDER_SITE_OTHER): Payer: Medicare Other | Admitting: *Deleted

## 2016-05-21 ENCOUNTER — Ambulatory Visit: Payer: Medicare Other | Admitting: *Deleted

## 2016-05-21 DIAGNOSIS — I480 Paroxysmal atrial fibrillation: Secondary | ICD-10-CM

## 2016-05-21 NOTE — Telephone Encounter (Signed)
LMOVM reminding pt to send remote transmission.   

## 2016-06-04 NOTE — Progress Notes (Signed)
Remote pacemaker transmission.   

## 2016-06-05 ENCOUNTER — Encounter: Payer: Self-pay | Admitting: Cardiology

## 2016-06-06 ENCOUNTER — Ambulatory Visit: Payer: Medicare Other | Admitting: Gastroenterology

## 2016-06-06 LAB — CUP PACEART REMOTE DEVICE CHECK
Battery Remaining Longevity: 80 mo
Implantable Lead Implant Date: 20060815
Implantable Lead Location: 753859
Implantable Pulse Generator Implant Date: 20140227
Lead Channel Pacing Threshold Amplitude: 0.875 V
Lead Channel Pacing Threshold Pulse Width: 0.4 ms
Lead Channel Setting Pacing Amplitude: 3.25 V
Lead Channel Setting Sensing Sensitivity: 2 mV
MDC IDC LEAD IMPLANT DT: 20060815
MDC IDC LEAD LOCATION: 753860
MDC IDC MSMT BATTERY IMPEDANCE: 380 Ohm
MDC IDC MSMT BATTERY VOLTAGE: 2.78 V
MDC IDC MSMT LEADCHNL RA IMPEDANCE VALUE: 376 Ohm
MDC IDC MSMT LEADCHNL RA PACING THRESHOLD AMPLITUDE: 1.625 V
MDC IDC MSMT LEADCHNL RA PACING THRESHOLD PULSEWIDTH: 0.4 ms
MDC IDC MSMT LEADCHNL RV IMPEDANCE VALUE: 575 Ohm
MDC IDC SESS DTM: 20171205173722
MDC IDC SET LEADCHNL RV PACING AMPLITUDE: 2.5 V
MDC IDC SET LEADCHNL RV PACING PULSEWIDTH: 0.4 ms
MDC IDC STAT BRADY AP VP PERCENT: 4 %
MDC IDC STAT BRADY AP VS PERCENT: 85 %
MDC IDC STAT BRADY AS VP PERCENT: 3 %
MDC IDC STAT BRADY AS VS PERCENT: 8 %

## 2016-06-24 ENCOUNTER — Ambulatory Visit: Payer: Medicare Other | Admitting: Cardiovascular Disease

## 2016-08-18 ENCOUNTER — Encounter (HOSPITAL_COMMUNITY): Payer: Self-pay | Admitting: *Deleted

## 2016-08-18 ENCOUNTER — Inpatient Hospital Stay (HOSPITAL_COMMUNITY)
Admission: EM | Admit: 2016-08-18 | Discharge: 2016-08-25 | DRG: 871 | Disposition: A | Discharge status: Home Health | Payer: Medicare Other | Attending: Internal Medicine | Admitting: Internal Medicine

## 2016-08-18 ENCOUNTER — Emergency Department (HOSPITAL_COMMUNITY): Payer: Medicare Other

## 2016-08-18 DIAGNOSIS — I5032 Chronic diastolic (congestive) heart failure: Secondary | ICD-10-CM | POA: Diagnosis present

## 2016-08-18 DIAGNOSIS — N390 Urinary tract infection, site not specified: Secondary | ICD-10-CM | POA: Diagnosis present

## 2016-08-18 DIAGNOSIS — Z515 Encounter for palliative care: Secondary | ICD-10-CM

## 2016-08-18 DIAGNOSIS — E869 Volume depletion, unspecified: Secondary | ICD-10-CM | POA: Diagnosis present

## 2016-08-18 DIAGNOSIS — K922 Gastrointestinal hemorrhage, unspecified: Secondary | ICD-10-CM | POA: Diagnosis not present

## 2016-08-18 DIAGNOSIS — F419 Anxiety disorder, unspecified: Secondary | ICD-10-CM | POA: Diagnosis present

## 2016-08-18 DIAGNOSIS — Z933 Colostomy status: Secondary | ICD-10-CM

## 2016-08-18 DIAGNOSIS — J9601 Acute respiratory failure with hypoxia: Secondary | ICD-10-CM | POA: Diagnosis present

## 2016-08-18 DIAGNOSIS — K2971 Gastritis, unspecified, with bleeding: Secondary | ICD-10-CM | POA: Diagnosis present

## 2016-08-18 DIAGNOSIS — A419 Sepsis, unspecified organism: Secondary | ICD-10-CM | POA: Diagnosis present

## 2016-08-18 DIAGNOSIS — I480 Paroxysmal atrial fibrillation: Secondary | ICD-10-CM | POA: Diagnosis present

## 2016-08-18 DIAGNOSIS — R1110 Vomiting, unspecified: Secondary | ICD-10-CM | POA: Diagnosis not present

## 2016-08-18 DIAGNOSIS — R195 Other fecal abnormalities: Secondary | ICD-10-CM

## 2016-08-18 DIAGNOSIS — R112 Nausea with vomiting, unspecified: Secondary | ICD-10-CM

## 2016-08-18 DIAGNOSIS — K3189 Other diseases of stomach and duodenum: Secondary | ICD-10-CM

## 2016-08-18 DIAGNOSIS — Z87891 Personal history of nicotine dependence: Secondary | ICD-10-CM

## 2016-08-18 DIAGNOSIS — J690 Pneumonitis due to inhalation of food and vomit: Secondary | ICD-10-CM | POA: Diagnosis present

## 2016-08-18 DIAGNOSIS — D72829 Elevated white blood cell count, unspecified: Secondary | ICD-10-CM

## 2016-08-18 DIAGNOSIS — K920 Hematemesis: Secondary | ICD-10-CM

## 2016-08-18 DIAGNOSIS — K311 Adult hypertrophic pyloric stenosis: Secondary | ICD-10-CM | POA: Diagnosis present

## 2016-08-18 DIAGNOSIS — N210 Calculus in bladder: Secondary | ICD-10-CM | POA: Diagnosis present

## 2016-08-18 DIAGNOSIS — I951 Orthostatic hypotension: Secondary | ICD-10-CM

## 2016-08-18 DIAGNOSIS — K3184 Gastroparesis: Secondary | ICD-10-CM | POA: Diagnosis present

## 2016-08-18 DIAGNOSIS — Z7901 Long term (current) use of anticoagulants: Secondary | ICD-10-CM

## 2016-08-18 DIAGNOSIS — Z7401 Bed confinement status: Secondary | ICD-10-CM

## 2016-08-18 DIAGNOSIS — I110 Hypertensive heart disease with heart failure: Secondary | ICD-10-CM | POA: Diagnosis present

## 2016-08-18 DIAGNOSIS — I959 Hypotension, unspecified: Secondary | ICD-10-CM | POA: Diagnosis present

## 2016-08-18 DIAGNOSIS — Z452 Encounter for adjustment and management of vascular access device: Secondary | ICD-10-CM

## 2016-08-18 DIAGNOSIS — Z79899 Other long term (current) drug therapy: Secondary | ICD-10-CM

## 2016-08-18 DIAGNOSIS — L8990 Pressure ulcer of unspecified site, unspecified stage: Secondary | ICD-10-CM | POA: Insufficient documentation

## 2016-08-18 DIAGNOSIS — Z820 Family history of epilepsy and other diseases of the nervous system: Secondary | ICD-10-CM

## 2016-08-18 DIAGNOSIS — R6521 Severe sepsis with septic shock: Secondary | ICD-10-CM | POA: Diagnosis present

## 2016-08-18 DIAGNOSIS — E46 Unspecified protein-calorie malnutrition: Secondary | ICD-10-CM | POA: Diagnosis present

## 2016-08-18 DIAGNOSIS — R9431 Abnormal electrocardiogram [ECG] [EKG]: Secondary | ICD-10-CM

## 2016-08-18 DIAGNOSIS — Z8711 Personal history of peptic ulcer disease: Secondary | ICD-10-CM

## 2016-08-18 DIAGNOSIS — G4733 Obstructive sleep apnea (adult) (pediatric): Secondary | ICD-10-CM | POA: Diagnosis present

## 2016-08-18 DIAGNOSIS — Z950 Presence of cardiac pacemaker: Secondary | ICD-10-CM

## 2016-08-18 DIAGNOSIS — Z800 Family history of malignant neoplasm of digestive organs: Secondary | ICD-10-CM

## 2016-08-18 DIAGNOSIS — R791 Abnormal coagulation profile: Secondary | ICD-10-CM | POA: Diagnosis present

## 2016-08-18 DIAGNOSIS — Z888 Allergy status to other drugs, medicaments and biological substances status: Secondary | ICD-10-CM

## 2016-08-18 DIAGNOSIS — T45515A Adverse effect of anticoagulants, initial encounter: Secondary | ICD-10-CM | POA: Diagnosis present

## 2016-08-18 DIAGNOSIS — Z681 Body mass index (BMI) 19 or less, adult: Secondary | ICD-10-CM

## 2016-08-18 DIAGNOSIS — R739 Hyperglycemia, unspecified: Secondary | ICD-10-CM | POA: Diagnosis present

## 2016-08-18 DIAGNOSIS — L8991 Pressure ulcer of unspecified site, stage 1: Secondary | ICD-10-CM | POA: Diagnosis present

## 2016-08-18 DIAGNOSIS — K209 Esophagitis, unspecified: Secondary | ICD-10-CM | POA: Diagnosis present

## 2016-08-18 DIAGNOSIS — Z8249 Family history of ischemic heart disease and other diseases of the circulatory system: Secondary | ICD-10-CM

## 2016-08-18 DIAGNOSIS — Z7189 Other specified counseling: Secondary | ICD-10-CM

## 2016-08-18 DIAGNOSIS — E876 Hypokalemia: Secondary | ICD-10-CM | POA: Diagnosis not present

## 2016-08-18 DIAGNOSIS — R0902 Hypoxemia: Secondary | ICD-10-CM

## 2016-08-18 HISTORY — DX: Cardiac arrhythmia, unspecified: I49.9

## 2016-08-18 HISTORY — DX: Unspecified osteoarthritis, unspecified site: M19.90

## 2016-08-18 HISTORY — DX: Presence of cardiac pacemaker: Z95.0

## 2016-08-18 HISTORY — DX: Gastritis, unspecified, without bleeding: K29.70

## 2016-08-18 LAB — COMPREHENSIVE METABOLIC PANEL WITH GFR
ALT: 11 U/L — ABNORMAL LOW (ref 17–63)
AST: 27 U/L (ref 15–41)
Albumin: 3.7 g/dL (ref 3.5–5.0)
Alkaline Phosphatase: 74 U/L (ref 38–126)
Anion gap: 12 (ref 5–15)
BUN: 31 mg/dL — ABNORMAL HIGH (ref 6–20)
CO2: 36 mmol/L — ABNORMAL HIGH (ref 22–32)
Calcium: 9.6 mg/dL (ref 8.9–10.3)
Chloride: 88 mmol/L — ABNORMAL LOW (ref 101–111)
Creatinine, Ser: 1.34 mg/dL — ABNORMAL HIGH (ref 0.61–1.24)
GFR calc Af Amer: 55 mL/min — ABNORMAL LOW
GFR calc non Af Amer: 48 mL/min — ABNORMAL LOW
Glucose, Bld: 201 mg/dL — ABNORMAL HIGH (ref 65–99)
Potassium: 4.9 mmol/L (ref 3.5–5.1)
Sodium: 136 mmol/L (ref 135–145)
Total Bilirubin: 0.9 mg/dL (ref 0.3–1.2)
Total Protein: 6.9 g/dL (ref 6.5–8.1)

## 2016-08-18 LAB — TYPE AND SCREEN
ABO/RH(D): A POS
Antibody Screen: NEGATIVE

## 2016-08-18 LAB — CBC WITH DIFFERENTIAL/PLATELET
Basophils Absolute: 0 10*3/uL (ref 0.0–0.1)
Basophils Relative: 0 %
Eosinophils Absolute: 0 10*3/uL (ref 0.0–0.7)
Eosinophils Relative: 0 %
HCT: 45.9 % (ref 39.0–52.0)
Hemoglobin: 15.8 g/dL (ref 13.0–17.0)
Lymphocytes Relative: 4 %
Lymphs Abs: 0.9 10*3/uL (ref 0.7–4.0)
MCH: 28.9 pg (ref 26.0–34.0)
MCHC: 34.4 g/dL (ref 30.0–36.0)
MCV: 84.1 fL (ref 78.0–100.0)
Monocytes Absolute: 0.9 10*3/uL (ref 0.1–1.0)
Monocytes Relative: 4 %
Neutro Abs: 21.4 10*3/uL — ABNORMAL HIGH (ref 1.7–7.7)
Neutrophils Relative %: 92 %
Platelets: 379 10*3/uL (ref 150–400)
RBC: 5.46 MIL/uL (ref 4.22–5.81)
RDW: 15.6 % — ABNORMAL HIGH (ref 11.5–15.5)
WBC: 23.2 10*3/uL — ABNORMAL HIGH (ref 4.0–10.5)

## 2016-08-18 LAB — PROTIME-INR
INR: 3.14
Prothrombin Time: 33 seconds — ABNORMAL HIGH (ref 11.4–15.2)

## 2016-08-18 LAB — I-STAT CG4 LACTIC ACID, ED: Lactic Acid, Venous: 3.45 mmol/L (ref 0.5–1.9)

## 2016-08-18 LAB — CG4 I-STAT (LACTIC ACID): Lactic Acid, Venous: 2.98 mmol/L (ref 0.5–1.9)

## 2016-08-18 LAB — POC OCCULT BLOOD, ED: Fecal Occult Bld: POSITIVE — AB

## 2016-08-18 LAB — MRSA PCR SCREENING: MRSA by PCR: POSITIVE — AB

## 2016-08-18 MED ORDER — SODIUM CHLORIDE 0.9 % IV SOLN
INTRAVENOUS | Status: DC
  Administered 2016-08-18 – 2016-08-20 (×3): via INTRAVENOUS

## 2016-08-18 MED ORDER — SODIUM CHLORIDE 0.9 % IV BOLUS (SEPSIS)
1000.0000 mL | Freq: Once | INTRAVENOUS | Status: AC
  Administered 2016-08-18: 1000 mL via INTRAVENOUS

## 2016-08-18 MED ORDER — SODIUM CHLORIDE 0.9 % IV SOLN
8.0000 mg/h | INTRAVENOUS | Status: DC
  Administered 2016-08-18 – 2016-08-19 (×2): 8 mg/h via INTRAVENOUS
  Filled 2016-08-18 (×5): qty 80

## 2016-08-18 MED ORDER — PIPERACILLIN-TAZOBACTAM 3.375 G IVPB 30 MIN
3.3750 g | Freq: Once | INTRAVENOUS | Status: AC
  Administered 2016-08-18: 3.375 g via INTRAVENOUS
  Filled 2016-08-18: qty 50

## 2016-08-18 MED ORDER — PIPERACILLIN-TAZOBACTAM 3.375 G IVPB
3.3750 g | Freq: Three times a day (TID) | INTRAVENOUS | Status: DC
  Administered 2016-08-19 – 2016-08-24 (×18): 3.375 g via INTRAVENOUS
  Filled 2016-08-18 (×15): qty 50

## 2016-08-18 MED ORDER — PROMETHAZINE HCL 25 MG/ML IJ SOLN
12.5000 mg | Freq: Four times a day (QID) | INTRAMUSCULAR | Status: DC | PRN
  Administered 2016-08-24: 12.5 mg via INTRAVENOUS
  Filled 2016-08-18: qty 1

## 2016-08-18 MED ORDER — SODIUM CHLORIDE 0.9 % IV SOLN: INTRAVENOUS | Status: AC

## 2016-08-18 MED ORDER — PROMETHAZINE HCL 25 MG/ML IJ SOLN
6.2500 mg | Freq: Once | INTRAMUSCULAR | Status: AC
  Administered 2016-08-18: 6.25 mg via INTRAVENOUS
  Filled 2016-08-18: qty 1

## 2016-08-18 MED ORDER — VITAMIN K1 10 MG/ML IJ SOLN
5.0000 mg | Freq: Once | INTRAMUSCULAR | Status: AC
  Administered 2016-08-18: 5 mg via SUBCUTANEOUS
  Filled 2016-08-18: qty 1

## 2016-08-18 MED ORDER — SODIUM CHLORIDE 0.9 % IV SOLN
10.0000 mL/h | Freq: Once | INTRAVENOUS | Status: DC
  Administered 2016-08-18: 10 mL/h via INTRAVENOUS

## 2016-08-18 MED ORDER — PANTOPRAZOLE SODIUM 40 MG IV SOLR
INTRAVENOUS | Status: AC
  Filled 2016-08-18: qty 160

## 2016-08-18 MED ORDER — SODIUM CHLORIDE 0.9 % IV SOLN
80.0000 mg | Freq: Once | INTRAVENOUS | Status: AC
  Administered 2016-08-18: 23:00:00 80 mg via INTRAVENOUS
  Filled 2016-08-18: qty 80

## 2016-08-18 MED ORDER — ACETAMINOPHEN 325 MG PO TABS
650.0000 mg | ORAL_TABLET | Freq: Four times a day (QID) | ORAL | Status: DC | PRN
  Administered 2016-08-24 – 2016-08-25 (×2): 650 mg via ORAL
  Filled 2016-08-18 (×2): qty 2

## 2016-08-18 MED ORDER — VANCOMYCIN HCL IN DEXTROSE 1-5 GM/200ML-% IV SOLN
1000.0000 mg | INTRAVENOUS | Status: DC
  Administered 2016-08-19 – 2016-08-24 (×6): 1000 mg via INTRAVENOUS
  Filled 2016-08-18 (×5): qty 200

## 2016-08-18 MED ORDER — VANCOMYCIN HCL IN DEXTROSE 1-5 GM/200ML-% IV SOLN
1000.0000 mg | Freq: Once | INTRAVENOUS | Status: AC
  Administered 2016-08-18: 1000 mg via INTRAVENOUS
  Filled 2016-08-18: qty 200

## 2016-08-18 MED ORDER — ACETAMINOPHEN 650 MG RE SUPP: 650.0000 mg | Freq: Four times a day (QID) | RECTAL | Status: DC | PRN

## 2016-08-18 NOTE — ED Provider Notes (Signed)
[facility] Provider Note   CSN: [id number] Arrival date & time: 08/18/16  1447     History   Chief Complaint Chief Complaint  Patient presents with  . Emesis    HPI Jon [name] is a 83 y.o. male.  HPI  Pt was seen at 1510.  Per pt and his family, c/o gradual onset and persistence of multiple intermittent episodes of N/V that began yesterday. Has been associated with vague abd "pains," and "decreased" ostomy output since yesterday. Family states the stools have been "thicker than usual" for the past several weeks. Describes the stools and emesis as "dark."  Denies diarrhea, no CP/SOB, no back pain, no fevers, no red blood in stools or emesis.    Past Medical History:  Diagnosis Date  . Abnormality of gait   . B12 deficiency   . Bilateral hydronephrosis   . Bladder calculi   . Bleeding ulcer   . Essential hypertension   . Essential tremor   . Gastritis   . Macular degeneration of left eye   . Memory loss   . Multiple rib fractures   . Neuropathy (HCC)   . OSA (obstructive sleep apnea)    Does not use CPAP  . PAF (paroxysmal atrial fibrillation) (HCC)   . Tachycardia-bradycardia syndrome (HCC)    [employer] PPM - Dr. Royann Shivers  . Varicose veins    Bilateral    Patient Active Problem List   Diagnosis Date Noted  . Sacral decubitus ulcer, stage IV (HCC) 02/05/2016  . Epistaxis 05/10/2016  . Diarrhea 05/02/2016  . Prolonged QT interval   . Paroxysmal atrial fibrillation (HCC)   . Tachycardia-bradycardia syndrome (HCC)   . Cardiac pacemaker in situ   . Hematemesis 05/15/2015  . GIB (gastrointestinal bleeding) 05/15/2015  . Malnutrition of moderate degree (HCC) 07/11/2014  . Perianal infection 07/06/2014  . Decubitus ulcer 07/06/2014  . Sacral ulcer (HCC) 07/06/2014  . Anemia of chronic disease 06/16/2014  . Sepsis (HCC) 06/15/2014  . Lower urinary tract infectious disease 06/15/2014  . Acute kidney injury (HCC) 06/15/2014  . Bilateral  hydronephrosis 06/15/2014  . Bladder calculi 06/15/2014  . Rhabdomyolysis 06/15/2014  . Multiple rib fractures 06/15/2014  . Hypotension 06/15/2014  . S/P placement of cardiac pacemaker 06/15/2014  . Dementia 06/15/2014  . AKI (acute kidney injury) (HCC) 06/15/2014  . Knee pain   . Dehydration 06/09/2014  . Hypertension 06/09/2014  . Abnormality of gait 05/03/2013  . Essential and other specified forms of tremor 05/03/2013  . Polyneuropathy in other diseases classified elsewhere (HCC) 05/03/2013  . Atrial fibrillation (HCC) 09/07/2012  . Long term current use of anticoagulant therapy 09/07/2012    Past Surgical History:  Procedure Laterality Date  . CATARACT EXTRACTION Bilateral 2013  . COLOSTOMY N/A 07/11/2014   Procedure: DIVERTING COLOSTOMY;  Surgeon: Dalia Heading, MD;  Location: [facility];  Service: General;  Laterality: N/A;  wound class: clean contaminated  . ESOPHAGOGASTRODUODENOSCOPY N/A 05/16/2015   Procedure: ESOPHAGOGASTRODUODENOSCOPY (EGD);  Surgeon: West Bali, MD;  Location: [facility]ITE;  Service: Endoscopy;  Laterality: N/A;  . INCISION AND DRAINAGE OF WOUND N/A 07/11/2014   Procedure: DEBRIDEMENT OF SACRUM;  Surgeon: Dalia Heading, MD;  Location: [facility];  Service: General;  Laterality: N/A;  . MENINGIOMA REMOVAL  1992   POST RT FRONTAL   . PACEMAKER INSERTION  2006  . PERMANENT PACEMAKER GENERATOR CHANGE N/A 08/13/2012   Procedure: PERMANENT PACEMAKER GENERATOR CHANGE;  Surgeon: Thurmon Fair, MD;  Location:  [facility];  Service: Cardiovascular;  Laterality: N/A;       Home Medications    Prior to Admission medications   Medication Sig Start Date End Date Taking? Authorizing Provider  ALPRAZolam Prudy Feeler) 1 MG tablet Take 0.5 tablets (0.5 mg total) by mouth 2 (two) times daily as needed for anxiety. Patient taking differently: Take 0.5 mg by mouth 5 (five) times daily as needed for anxiety.  06/18/14   Hollice Espy, MD  Amino Acids-Protein Hydrolys  (FEEDING SUPPLEMENT, PRO-STAT SUGAR FREE 64,) LIQD Take 30 mLs by mouth 3 (three) times daily with meals. 05/06/16   Leroy Sea, MD  cefpodoxime (VANTIN) 200 MG tablet Take 1 tablet (200 mg total) by mouth every 12 (twelve) hours. 05/06/16   Leroy Sea, MD  diphenhydrAMINE (BENADRYL) 25 MG tablet Take 25 mg by mouth every 6 (six) hours as needed for itching.    Historical Provider, MD  HYDROcodone-acetaminophen (NORCO) 10-325 MG tablet Take 1 tablet by mouth every 6 (six) hours as needed for moderate pain.  05/04/15   Historical Provider, MD  Multiple Vitamins-Minerals (ICAPS AREDS FORMULA PO) Take 1 tablet by mouth daily.    Historical Provider, MD  nystatin (MYCOSTATIN/NYSTOP) powder Apply 1 application topically daily. 02/20/16   Historical Provider, MD  pantoprazole (PROTONIX) 40 MG tablet Take 1 tablet (40 mg total) by mouth 2 (two) times daily before a meal. 05/18/15   Erick Blinks, MD  potassium chloride SA (K-DUR,KLOR-CON) 20 MEQ tablet Take 1 tablet by mouth 2 (two) times daily. 05/08/16   Historical Provider, MD  sotalol (BETAPACE) 160 MG tablet Take 1 tablet (160 mg total) by mouth 2 (two) times daily. Need appointment for future refills 06/06/15   Thurmon Fair, MD  Tetrahydrozoline HCl (EYE DROPS OP) Apply 2 drops to eye daily as needed (irritation).    Historical Provider, MD    Family History Family History  Problem Relation Age of Onset  . Aortic aneurysm Mother   . Parkinsonism Mother   . Heart disease Father   . Esophageal cancer Sister   . Colon cancer Neg Hx     Social History Social History  Substance Use Topics  . Smoking status: Former Smoker    Packs/day: 0.75    Years: 25.00    Types: Cigarettes  . Smokeless tobacco: Never Used     Comment: QUIT 35 YRS AGO  . Alcohol use No     Allergies   Iohexol; Shellfish allergy; Dilantin [phenytoin sodium extended]; and Tegretol [carbamazepine]   Review of Systems Review of Systems ROS: Statement: All  systems negative except as marked or noted in the HPI; Constitutional: Negative for fever and chills. ; ; Eyes: Negative for eye pain, redness and discharge. ; ; ENMT: Negative for ear pain, hoarseness, nasal congestion, sinus pressure and sore throat. ; ; Cardiovascular: Negative for chest pain, palpitations, diaphoresis, dyspnea and peripheral edema. ; ; Respiratory: Negative for cough, wheezing and stridor. ; ; Gastrointestinal: +N/V, vague abd pain, decreased ostomy output. Negative for diarrhea, blood in stool, hematemesis, jaundice. . ; ; Genitourinary: Negative for dysuria, flank pain and hematuria. ; ; Musculoskeletal: Negative for back pain and neck pain. Negative for swelling and trauma.; ; Skin: Negative for pruritus, rash, abrasions, blisters, bruising and skin lesion.; ; Neuro: Negative for headache, lightheadedness and neck stiffness. Negative for weakness, altered level of consciousness, altered mental status, extremity weakness, paresthesias, involuntary movement, seizure and syncope.      Physical Exam Updated  Vital Signs BP 135/99   Pulse 92   Temp 98.6 F (37 C) (Other (Comment)) Comment: stoma  Resp 19   Ht 5\' 10"  (1.778 m)   Wt 136 lb (61.7 kg)   SpO2 99%   BMI 19.51 kg/m    Patient Vitals for the past 24 hrs:  BP Temp Temp src Pulse Resp SpO2 Height Weight  08/18/16 1715 98/80 - - 87 20 95 % - -  08/18/16 1700 123/97 - - 86 19 97 % - -  08/18/16 1645 100/74 - - 79 15 98 % - -  08/18/16 1630 118/82 - - - 19 - - -  08/18/16 1615 133/88 - - - 18 - - -  08/18/16 1613 135/99 - - 92 19 99 % - -  08/18/16 1511 - 98.6 F (37 C) Other - - - - -  08/18/16 1500 90/73 - - - - - - -  08/18/16 1452 - 99.4 F (37.4 C) Rectal - - - - -  08/18/16 1441 (!) 69/59 - - 71 18 94 % - -  08/18/16 1438 - - - - - - 5\' 10"  (1.778 m) 136 lb (61.7 kg)     Physical Exam 1515: Physical examination:  Nursing notes reviewed; Vital signs and O2 SAT reviewed;  Constitutional: Thin, frail. In  no acute distress; Head:  Normocephalic, atraumatic; Eyes: EOMI, PERRL, No scleral icterus; ENMT: Mouth and pharynx normal, Mucous membranes dry; Neck: Supple, Full range of motion, No lymphadenopathy; Cardiovascular: Regular rate and rhythm, No gallop; Respiratory: Breath sounds clear & equal bilaterally, No wheezes.  Speaking full sentences with ease, Normal respiratory effort/excursion; Chest: Nontender, Movement normal; Abdomen: Soft, Nontender, Nondistended, Normal bowel sounds. +ostomy pink/moist, scant dark stool in bag.; Genitourinary: No CVA tenderness; Extremities: Pulses normal, No tenderness, No edema, No calf edema or asymmetry.; Neuro: AA&Ox3, rambling historian. Major CN grossly intact.  Speech clear. No gross focal motor deficits in extremities.; Skin: Color pale, Warm, Dry.   ED Treatments / Results  Labs (all labs ordered are listed, but only abnormal results are displayed)   EKG  EKG Interpretation  Date/Time:  Sunday August 18 2016 15:44:16 EST Ventricular Rate:  87 PR Interval:    QRS Duration: 167 QT Interval:  454 QTC Calculation: 544 R Axis:   38 Text Interpretation:  Atrial-paced complexes Ventricular premature complex IVCD, consider atypical RBBB Artifact When compared with ECG of 05/10/2016 atrial-paced complexes has replaced Atrial fibrillation Confirmed by [facility]  MD, Nicholos Johns [phone]) on 08/18/2016 6:00:36 PM       Radiology   Procedures Procedures (including critical care time)  Medications Ordered in ED Medications  sodium chloride 0.9 % bolus 1,000 mL (0 mLs Intravenous Stopped 08/18/16 1609)    And  sodium chloride 0.9 % bolus 1,000 mL (not administered)  0.9 %  sodium chloride infusion (not administered)  promethazine (PHENERGAN) injection 6.25 mg (6.25 mg Intravenous Given 08/18/16 1622)     Initial Impression / Assessment and Plan / ED Course  I have reviewed the triage vital signs and the nursing notes.  Pertinent labs & imaging results that  were available during my care of the patient were reviewed by me and considered in my medical decision making (see chart for details).  MDM Reviewed: previous chart, nursing note and vitals Reviewed previous: labs and ECG Interpretation: labs, x-ray, ECG and CT scan Total time providing critical care: 30-74 minutes. This excludes time spent performing separately reportable procedures and services. Consults: admitting  MD   CRITICAL CARE Performed by: Laray Anger Total critical care time: 35 minutes Critical care time was exclusive of separately billable procedures and treating other patients. Critical care was necessary to treat or prevent imminent or life-threatening deterioration. Critical care was time spent personally by me on the following activities: development of treatment plan with patient and/or surrogate as well as nursing, discussions with consultants, evaluation of patient's response to treatment, examination of patient, obtaining history from patient or surrogate, ordering and performing treatments and interventions, ordering and review of laboratory studies, ordering and review of radiographic studies, pulse oximetry and re-evaluation of patient's condition.   ED ECG REPORT   Date: 08/18/2016  Rate: 87  Rhythm: paced complexes, artifact  QRS Axis: normal  Intervals: QT prolonged  ST/T Wave abnormalities: nonspecific ST/T changes  Conduction Disutrbances:nonspecific intraventricular conduction delay  Narrative Interpretation:   Old EKG Reviewed: changes noted; previous EKG dated 05/10/2016 was afib.   Results for orders placed or performed during the hospital encounter of 08/18/16  Blood Culture (routine x 2)  Result Value Ref Range   Specimen Description LEFT ANTECUBITAL    Special Requests BOTTLES DRAWN AEROBIC AND ANAEROBIC [facility] EACH    Culture PENDING    Report Status PENDING   Blood Culture (routine x 2)  Result Value Ref Range   Specimen Description  LEFT ANTECUBITAL    Special Requests BOTTLES DRAWN AEROBIC AND ANAEROBIC 8CC EACH    Culture PENDING    Report Status PENDING   Comprehensive metabolic panel  Result Value Ref Range   Sodium 136 135 - 145 mmol/L   Potassium 4.9 3.5 - 5.1 mmol/L   Chloride 88 (L) 101 - 111 mmol/L   CO2 36 (H) 22 - 32 mmol/L   Glucose, Bld 201 (H) 65 - 99 mg/dL   BUN 31 (H) 6 - 20 mg/dL   Creatinine, Ser 6.96 (H) 0.61 - 1.24 mg/dL   Calcium 9.6 8.9 - 29.5 mg/dL   Total Protein 6.9 6.5 - 8.1 g/dL   Albumin 3.7 3.5 - 5.0 g/dL   AST 27 15 - 41 U/L   ALT 11 (L) 17 - 63 U/L   Alkaline Phosphatase 74 38 - 126 U/L   Total Bilirubin 0.9 0.3 - 1.2 mg/dL   GFR calc non Af Amer 48 (L) >60 mL/min   GFR calc Af Amer 55 (L) >60 mL/min   Anion gap 12 5 - 15  CBC WITH DIFFERENTIAL  Result Value Ref Range   WBC 23.2 (H) 4.0 - 10.5 K/uL   RBC 5.46 4.22 - 5.81 MIL/uL   Hemoglobin 15.8 13.0 - 17.0 g/dL   HCT 28.4 13.2 - 44.0 %   MCV 84.1 78.0 - 100.0 fL   MCH 28.9 26.0 - 34.0 pg   MCHC 34.4 30.0 - 36.0 g/dL   RDW 10.2 (H) 72.5 - 36.6 %   Platelets 379 150 - 400 K/uL   Neutrophils Relative % 92 %   Neutro Abs 21.4 (H) 1.7 - 7.7 K/uL   Lymphocytes Relative 4 %   Lymphs Abs 0.9 0.7 - 4.0 K/uL   Monocytes Relative 4 %   Monocytes Absolute 0.9 0.1 - 1.0 K/uL   Eosinophils Relative 0 %   Eosinophils Absolute 0.0 0.0 - 0.7 K/uL   Basophils Relative 0 %   Basophils Absolute 0.0 0.0 - 0.1 K/uL  Protime-INR  Result Value Ref Range   Prothrombin Time 33.0 (H) 11.4 - 15.2 seconds   INR 3.14  I-Stat CG4 Lactic Acid, ED  (not at  [facility])  Result Value Ref Range   Lactic Acid, Venous 3.45 (HH) 0.5 - 1.9 mmol/L   Comment NOTIFIED PHYSICIAN   POC occult blood, ED  Result Value Ref Range   Fecal Occult Bld POSITIVE (A) NEGATIVE  Type and screen [name]  Result Value Ref Range   ABO/RH(D) A POS    Antibody Screen NEG    Sample Expiration 08/21/2016     Ct Abdomen Pelvis Wo Contrast Result Date:  08/18/2016 CLINICAL DATA:  Weakness, vomiting, and hypotension. EXAM: CT ABDOMEN AND PELVIS WITHOUT CONTRAST TECHNIQUE: Multidetector CT imaging of the abdomen and pelvis was performed following the standard protocol without IV contrast. COMPARISON:  CT scans since June 15, 2014 FINDINGS: Lower chest: The small left pleural effusion is decreased in size in the interval. The right-sided pleural effusion has resolved. There is fluid in the distal esophagus. Coronary artery calcifications are noted. Mild dependent atelectasis. No acute abnormalities in the lung bases. Hepatobiliary: Cholelithiasis is identified. The liver is unchanged. Pancreas: Unremarkable. No pancreatic ductal dilatation or surrounding inflammatory changes. Spleen: Normal in size without focal abnormality. Adrenals/Urinary Tract: Multiple simple and hyperdense renal cysts are seen, unchanged. The largest is on the right measuring at least 11 cm in transverse dimension. Vascular calcifications are associated with the kidneys. There are also renal stones. The stone in the right renal pelvis is larger in the interval measuring 17 by 9 mm. There is no resulting hydronephrosis but there is mild increased attenuation of fat adjacent to the right renal pelvis. Remainder of the right ureter is normal in caliber. No left-sided stones. The left ureter is normal. There is a Foley catheter in the bladder. Multiple stones are seen primarily dependently in the bladder. There is mild stranding adjacent to the bladder which could be seen with cystitis. This finding is similar in the interval however. Stomach/Bowel: The stomach is markedly distended. This finding is worsened in the interval. 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 The duodenum itself is normal in caliber. The remainder of the small bowel is normal in caliber as well. The patient is status post partial colectomy. There is a left lower quadrant ostomy. The remainder of the  colon is unremarkable. Scattered diverticuli without diverticulitis. No evidence of appendicitis. Vascular/Lymphatic: Atherosclerotic change is seen in the non aneurysmal aorta. No adenopathy. Reproductive: Prostate is unremarkable. Other: Mild increased attenuation in the fat of the pericolic gutters is stable as is the mild increased attenuation anterior to the sacrum. Musculoskeletal: No interval bony changes identified. IMPRESSION: 1. Markedly distended stomach, worsened in the interval. This could be seen with gastric outlet obstruction or severe gastroparesis. 2. 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 3. Cholelithiasis. 4. Multiple renal cysts. The stone in the right renal pelvis has increased in the interval and there is now mild increased attenuation of fat adjacent to the right renal pelvis which could be due to intermittent obstruction. No hydronephrosis on today's study. 5. Multiple stones in the bladder. Mild fat stranding adjacent to the bladder could be due to cystitis. Recommend correlation with urinalysis to assess for UTI. 6. Atherosclerosis. 7. Diverticulosis. Electronically Signed   By: Gerome Samavid  Williams III M.D   On: 08/18/2016 17:58   Dg Chest Port 1 View Result Date: 08/18/2016 CLINICAL DATA:  Hypotension and vomiting today.  05/10/2016 EXAM: PORTABLE CHEST 1 VIEW COMPARISON:  None. FINDINGS: Left-sided transvenous pacemaker leads to the right atrium and right ventricle. Heart is mildly enlarged. Aorta is tortuous and partially calcified. Lungs are clear. No pulmonary edema. No free intraperitoneal air beneath the diaphragm. IMPRESSION: Stable cardiomegaly.  No evidence for acute pulmonary abnormality. Electronically Signed   By: Norva Pavlov M.D.   On: 08/18/2016 15:30   Dg Abd 2 Views Result Date: 08/18/2016 CLINICAL DATA:  Nausea and vomiting. History of gastritis and hypertension. EXAM: ABDOMEN - 2 VIEW  COMPARISON:  05/04/2016 FINDINGS: Two supine views. The more cephalad imaged excludes the far left-sided abdomen. Nasogastric tube has been removed. Moderate-to-marked gaseous distension of the stomach. No gross free intraperitoneal air. No bowel distension. Osteopenia. Right hip osteoarthritis. IMPRESSION: Gaseous distension of the stomach, nonspecific.  CT pending. Electronically Signed   By: Jeronimo Greaves M.D.   On: 08/18/2016 16:57     1825:  IVF boluses given for elevated lactic acid and hypotension with improvement. Remains afebrile. Minimal urine output; another IVF bolus given. Stool heme positive with stable H/H. Unclear if symptoms c/w GI bleeding vs sepsis. IV abx started after WBC count and lactic acid resulted. Pt has prolonged QT, making meds selection challenging (ie: Protonix, anti-emetic, abx). CT as above. NGT recommended to pt and family. Pt states he "doesn't want the tube again;" recalls NGT from previous admission for similar symptoms. Dx and testing d/w pt and family.  Questions answered.  Verb understanding, agreeable to admit.  T/C to [facility]. Sharl Ma, case discussed, including:  HPI, pertinent PM/SHx, VS/PE, dx testing, ED course and treatment:  Agreeable to admit, requests to write temporary orders, obtain stepdown bed to team [facility].  1925:  Pt actively vomiting coffee-ground emesis. Will reverse INR with VIt K and FFP. Given hx gastritis on EGD from 2016, will start IV protonix bolus and gtt. T/C to GI Dr. Darrick Penna, case discussed, including:  HPI, pertinent PM/SHx, VS/PE, dx testing, ED course and treatment:  Agreeable with ED plan, will consult tomorrow.     Final Clinical Impressions(s) / ED Diagnoses   Final diagnoses:  N&V (nausea and vomiting)    New Prescriptions New Prescriptions   No medications on file     Samuel Jester, DO 08/23/16 1234

## 2016-08-18 NOTE — ED Triage Notes (Signed)
Pt comes in by EMS from home. Pt has had decreased output of stoma starting yesterday. Today he has been unable to keep any food down.

## 2016-08-18 NOTE — ED Provider Notes (Signed)
MSE was initiated and I personally evaluated the patient and placed orders (if any) at  2:59 PM on August 18, 2016.  The patient appears stable so that the remainder of the MSE may be completed by another provider.   The patient arrived at approximately 2:50 PM, he presents from home with significant weakness, vomiting dark material, hypotensive and ill appearing. The patient does have a reported history of being on Coumadin for atrial  He has a stoma and has been having some dark stools.On exam the patient has a soft abdomen, no tenderness, no tachycardia as an oxygen of 88% and a blood pressure of 80 systolic. The patient does appear acutely ill. The question is whether this is from sepsis as he has had urinary tract infection with sepsis in the past or whether this is possibly gastrointestinal bleeding which is another leading possibility. The patient is ill-appearing, he will likely need admission to the hospital. Fluid resuscitation begun, sepsis order set used.It is not clear whether the patient is septic or hypotensive from GI bleed.   Eber HongBrian Taylor Levick, MD 08/18/16 1501

## 2016-08-18 NOTE — H&P (Addendum)
TRH H&P    Patient Demographics:    Jon [name], is a 83 y.o. male  MRN: [id number]  DOB - 09-28-1931  Admit Date - 08/18/2016  Referring MD/NP/PA: Dr. Clarene DukeMcManus  Outpatient Primary MD for the patient is Milana ObeyStephen D Knowlton, MD  Patient coming from: Home  Chief Complaint  Patient presents with  . Emesis      HPI:    Jon [name]  is a 83 y.o. male, With history of gastritis, paroxysmal atrial fibrillation, tachycardia-bradycardia syndrome, status post pacemaker placement, hypertension, status post diverting colostomy and indwelling Foley due to sacral ulcer was brought to hospital with several episodes of coffee-ground emesis at home. As per patient's son he has noticed that ostomy has not been draining as much over past few days. Patient denies abdominal pain. No chest pain or shortness of breath.  In the ED patient was found to be hypotensive with systolic blood pressure in 80s, there was a concern for possible UTI and also was started on vancomycin and Zosyn empirically. Hemoglobin has been stable at 15.8. Patient takes Coumadin for atrial fibrillation, today INR is 3.14.  Patient given 1 unit of FFP, vitamin K 5 mg IV 1. Started on IV Protonix. Phenergan when necessary for nausea vomiting. CT scan of the abdomen pelvis showed markedly distended stomach, multiple stones in the urinary bladder. GI consulted by ED physician    Review of systems:    In addition to the HPI above,  No Fever-chills, No Headache, No changes with Vision or hearing, No problems swallowing food or Liquids, No Chest pain, Cough or Shortness of Breath, No dysuria, No new skin rashes or bruises, No new joints pains-aches,  No new weakness, tingling, numbness in any extremity, No recent weight gain or loss, No polyuria, polydypsia or polyphagia, No significant Mental Stressors.  A full 10 point Review of Systems  was done, except as stated above, all other Review of Systems were negative.   With Past History of the following :    Past Medical History:  Diagnosis Date  . Abnormality of gait   . B12 deficiency   . Bilateral hydronephrosis   . Bladder calculi   . Bleeding ulcer   . Essential hypertension   . Essential tremor   . Gastritis   . Macular degeneration of left eye   . Memory loss   . Multiple rib fractures   . Neuropathy (HCC)   . OSA (obstructive sleep apnea)    Does not use CPAP  . PAF (paroxysmal atrial fibrillation) (HCC)   . Tachycardia-bradycardia syndrome (HCC)    [employer] PPM - Dr. Royann Shiversroitoru  . Varicose veins    Bilateral      Past Surgical History:  Procedure Laterality Date  . CATARACT EXTRACTION Bilateral 2013  . COLOSTOMY N/A 07/11/2014   Procedure: DIVERTING COLOSTOMY;  Surgeon: Dalia HeadingMark A Jenkins, MD;  Location: [facility];  Service: General;  Laterality: N/A;  wound class: clean contaminated  . ESOPHAGOGASTRODUODENOSCOPY N/A 05/16/2015   Procedure: ESOPHAGOGASTRODUODENOSCOPY (EGD);  Surgeon: West BaliSandi L Fields, MD;  Location: [facility];  Service: Endoscopy;  Laterality: N/A;  . INCISION AND DRAINAGE OF WOUND N/A 07/11/2014   Procedure: DEBRIDEMENT OF SACRUM;  Surgeon: Dalia Heading, MD;  Location: [facility];  Service: General;  Laterality: N/A;  . MENINGIOMA REMOVAL  1992   POST RT FRONTAL   . PACEMAKER INSERTION  2006  . PERMANENT PACEMAKER GENERATOR CHANGE N/A 08/13/2012   Procedure: PERMANENT PACEMAKER GENERATOR CHANGE;  Surgeon: Thurmon Fair, MD;  Location: [facility];  Service: Cardiovascular;  Laterality: N/A;      Social History:      Social History  Substance Use Topics  . Smoking status: Former Smoker    Packs/day: 0.75    Years: 25.00    Types: Cigarettes  . Smokeless tobacco: Never Used     Comment: QUIT 35 YRS AGO  . Alcohol use No       Family History :     Family History  Problem Relation Age of Onset  . Aortic aneurysm Mother   .  Parkinsonism Mother   . Heart disease Father   . Esophageal cancer Sister   . Colon cancer Neg Hx       Home Medications:   Prior to Admission medications   Medication Sig Start Date End Date Taking? Authorizing Provider  ALPRAZolam Prudy Feeler) 1 MG tablet Take 0.5 tablets (0.5 mg total) by mouth 2 (two) times daily as needed for anxiety. Patient taking differently: Take 0.5 mg by mouth 5 (five) times daily as needed for anxiety.  06/18/14  Yes Hollice Espy, MD  diphenhydrAMINE (BENADRYL) 25 MG tablet Take 25 mg by mouth every 6 (six) hours as needed for itching.   Yes Historical Provider, MD  HYDROcodone-acetaminophen (NORCO) 10-325 MG tablet Take 1 tablet by mouth every 6 (six) hours as needed for moderate pain.  05/04/15  Yes Historical Provider, MD  lisinopril (PRINIVIL,ZESTRIL) 10 MG tablet Take 10 mg by mouth daily.   Yes Historical Provider, MD  Multiple Vitamins-Minerals (ICAPS AREDS FORMULA PO) Take 1 tablet by mouth daily.   Yes Historical Provider, MD  nystatin (MYCOSTATIN/NYSTOP) powder Apply 1 application topically daily. 02/20/16  Yes Historical Provider, MD  pantoprazole (PROTONIX) 40 MG tablet Take 1 tablet (40 mg total) by mouth 2 (two) times daily before a meal. 05/18/15  Yes Erick Blinks, MD  potassium chloride SA (K-DUR,KLOR-CON) 20 MEQ tablet Take 1 tablet by mouth 2 (two) times daily. 05/08/16  Yes Historical Provider, MD  sotalol (BETAPACE) 160 MG tablet Take 1 tablet (160 mg total) by mouth 2 (two) times daily. Need appointment for future refills 06/06/15  Yes Mihai Croitoru, MD  Tetrahydrozoline HCl (EYE DROPS OP) Apply 2 drops to eye daily as needed (irritation).   Yes Historical Provider, MD  warfarin (COUMADIN) 2.5 MG tablet Take 2.5-5 mg by mouth daily. Take two tablets on Tuesdays and Thursdays and one on all other days   Yes Historical Provider, MD     Allergies:     Allergies  Allergen Reactions  . Iohexol      Desc: PT STATES THROAT/FACE SWELLING W/ IVP  DYE IN 1990. PT HAS NOT HAD ANY EXAMS DONE W/DYE SINCE AND HAS NEVER BEEN PRE MEDICATED. PER PT, Onset Date: [id number]   . Shellfish Allergy   . Dilantin [Phenytoin Sodium Extended] Rash  . Tegretol [Carbamazepine] Rash     Physical Exam:   Vitals  Blood pressure 96/80, pulse 92, temperature 98.6 F (37 C), temperature source Other (Comment),  resp. rate 21, height 5\' 10"  (1.778 m), weight 61.7 kg (136 lb), SpO2 94 %.  1.  General: Appears in mild distress, having coffee-ground emesis  2. Psychiatric:  Intact judgement and  insight, awake alert, oriented x 3.  3. Neurologic: No focal neurological deficits, all cranial nerves intact.Strength 5/5 all 4 extremities, sensation intact all 4 extremities, plantars down going.  4. Eyes :  anicteric sclerae, moist conjunctivae with no lid lag. PERRLA.  5. ENMT:  Oropharynx clear with moist mucous membranes and good dentition  6. Neck:  supple, no cervical lymphadenopathy appriciated, No thyromegaly  7. Respiratory : Normal respiratory effort, good air movement bilaterally,clear to  auscultation bilaterally  8. Cardiovascular : RRR, no gallops, rubs or murmurs, no leg edema  9. Gastrointestinal:  Positive bowel sounds, abdomen soft, non-tender to palpation,no hepatosplenomegaly, no rigidity or guarding . Ostomy in place.     10. Skin:  No cyanosis, normal texture and turgor, no rash, lesions or ulcers  11.Musculoskeletal:  Good muscle tone,  joints appear normal , no effusions,  normal range of motion    Data Review:    CBC  Recent Labs Lab 08/18/16 1521  WBC 23.2*  HGB 15.8  HCT 45.9  PLT 379  MCV 84.1  MCH 28.9  MCHC 34.4  RDW 15.6*  LYMPHSABS 0.9  MONOABS 0.9  EOSABS 0.0  BASOSABS 0.0   ------------------------------------------------------------------------------------------------------------------  Chemistries   Recent Labs Lab 08/18/16 1521  NA 136  K 4.9  CL 88*  CO2 36*  GLUCOSE 201*  BUN  31*  CREATININE 1.34*  CALCIUM 9.6  AST 27  ALT 11*  ALKPHOS 74  BILITOT 0.9   ------------------------------------------------------------------------------------------------------------------  ------------------------------------------------------------------------------------------------------------------ GFR: Estimated Creatinine Clearance: 37.1 mL/min (by C-G formula based on SCr of 1.34 mg/dL (H)). Liver Function Tests:  Recent Labs Lab 08/18/16 1521  AST 27  ALT 11*  ALKPHOS 74  BILITOT 0.9  PROT 6.9  ALBUMIN 3.7    Coagulation Profile:  Recent Labs Lab 08/18/16 1521  INR 3.14    --------------------------------------------------------------------------------------------------------------- Urine analysis:    Component Value Date/Time   COLORURINE YELLOW 05/02/2016 0815   APPEARANCEUR CLOUDY (A) 05/02/2016 0815   LABSPEC <1.005 (L) 05/02/2016 0815   PHURINE 7.5 05/02/2016 0815   GLUCOSEU NEGATIVE 05/02/2016 0815   HGBUR LARGE (A) 05/02/2016 0815   BILIRUBINUR NEGATIVE 05/02/2016 0815   KETONESUR NEGATIVE 05/02/2016 0815   PROTEINUR 30 (A) 05/02/2016 0815   UROBILINOGEN 0.2 03/14/2015 1515   NITRITE NEGATIVE 05/02/2016 0815   LEUKOCYTESUR LARGE (A) 05/02/2016 0815      Imaging Results:    Ct Abdomen Pelvis Wo Contrast  Result Date: 08/18/2016 CLINICAL DATA:  Weakness, vomiting, and hypotension. EXAM: CT ABDOMEN AND PELVIS WITHOUT CONTRAST TECHNIQUE:  IMPRESSION: 1. Markedly distended stomach, worsened in the interval. This could be seen with gastric outlet obstruction or severe gastroparesis. 2. 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 3. Cholelithiasis. 4. Multiple renal cysts. The stone in the right renal pelvis has increased in the interval and there is now mild increased attenuation of fat adjacent to the right renal pelvis which could be due to intermittent obstruction. No  hydronephrosis on today's study. 5. Multiple stones in the bladder. Mild fat stranding adjacent to the bladder could be due to cystitis. Recommend correlation with urinalysis to assess for UTI. 6. Atherosclerosis. 7. Diverticulosis. Electronically Signed   By: Gerome Sam  III M.D   On: 08/18/2016 17:58   Dg Chest Port 1 View  Result Date: 08/18/2016 CLINICAL DATA:  Hypotension and vomiting today.  05/10/2016 EXAM: PORTABLE CHEST 1 VIEW COMPARISON:  None. FINDINGS: Left-sided transvenous pacemaker leads to the right atrium and right ventricle. Heart is mildly enlarged. Aorta is tortuous and partially calcified. Lungs are clear. No pulmonary edema. No free intraperitoneal air beneath the diaphragm. IMPRESSION: Stable cardiomegaly.  No evidence for acute pulmonary abnormality. Electronically Signed   By: Norva Pavlov M.D.   On: 08/18/2016 15:30   Dg Abd 2 Views  Result Date: 08/18/2016 CLINICAL DATA:  Nausea and vomiting. History of gastritis and hypertension. EXAM: ABDOMEN - 2 VIEW COMPARISON:  05/04/2016 FINDINGS: Two supine views. The more cephalad imaged excludes the far left-sided abdomen. Nasogastric tube has been removed. Moderate-to-marked gaseous distension of the stomach. No gross free intraperitoneal air. No bowel distension. Osteopenia. Right hip osteoarthritis. IMPRESSION: Gaseous distension of the stomach, nonspecific.  CT pending. Electronically Signed   By: Jeronimo Greaves M.D.   On: 08/18/2016 16:57    My personal review of EKG: Rhythm - paced rhythm   Assessment & Plan:    Active Problems:   Hypotension   UGI bleed   1. Upper GI bleed- patient had EGD done in November 2016 which showed gastritis, will start IV Protonix infusion,  GI was consulted by ED physician, Dr. Clarene Duke spoke to Dr. Raj Janus and she recommended to continue IV Protonix and she will see patient in a.m. Will check H&H every 8 hours. Hemoglobin stable at 15.8. 2. Coagulopathy- patient takes Coumadin for A.  fib, INR 3.14  Today. Due to active bleed, vitamin K5 milligrams IV 1 and 1 unit FFP is given in the ED. 3. Leukocytosis/? sepsis- started empirically on Vancomycin and zosyn. UA is pending, follow blood and urine culture. 4. Atrial fibrillation- heart rate is controlled, due to hypotension we will hold sotalol. Coumadin has been stopped due to above. Will monitor patient closely in  stepdown unit. 5. History of hypertension- hold lisinopril, sotalol as patient was hypotensive in the ED.   DVT Prophylaxis-    SCDs   AM Labs Ordered, also please review Full Orders  Family Communication: Admission, patients condition and plan of care including tests being ordered have been discussed with the patient and His son at bedside who indicate understanding and agree with the plan and Code Status.  Code Status:  Full code  Admission status: Observation  Time spent in minutes : 60 min   Mahkayla Preece S M.D on 08/18/2016 at 7:37 PM  Between 7am to 7pm - Pager - [phone]. After 7pm go to [email or web address] - password [facility]  [facility] - Office  [phone]

## 2016-08-18 NOTE — Progress Notes (Addendum)
Pharmacy Antibiotic Note  [name] Tiegs is a 83 y.o. male admitted on 08/18/2016 with sepsis.  Pharmacy has been consulted for Vancomycin and Zosyn dosing.  Plan: Vancomycin 1000mg  IV every 24 hours.  Goal trough 15-20 mcg/mL. Zosyn 3.375g IV q8h (4 hour infusion).  F/U cxs and clinical progress Monitor V/S, labs, and levels as indicated  Height: 5\' 10"  (177.8 cm) Weight: 136 lb (61.7 kg) IBW/kg (Calculated) : 73  Temp (24hrs), Avg:99 F (37.2 C), Min:98.6 F (37 C), Max:99.4 F (37.4 C)   Recent Labs Lab 08/18/16 1521 08/18/16 1537  WBC 23.2*  --   CREATININE 1.34*  --   LATICACIDVEN  --  3.45*    Estimated Creatinine Clearance: 37.1 mL/min (by C-G formula based on SCr of 1.34 mg/dL (H)).    Allergies  Allergen Reactions  . Iohexol      Desc: PT STATES THROAT/FACE SWELLING W/ IVP DYE IN 1990. PT HAS NOT HAD ANY EXAMS DONE W/DYE SINCE AND HAS NEVER BEEN PRE MEDICATED. PER PT, Onset Date: [id number]   . Shellfish Allergy   . Dilantin [Phenytoin Sodium Extended] Rash  . Tegretol [Carbamazepine] Rash    Antimicrobials this admission: Vancomycin 3/4 >>  Zosyn 3/4>>  Dose adjustments this admission: N/A  Microbiology results: 3/4 BCx: pending 3/4 UCx: pending 05/02/2016 MRSA PCR: positive  Thank you for allowing pharmacy to be a part of this patient's care.  Elder CyphersLorie Maddoxx Burkitt, BS Loura Backharm D, [place] Clinical Pharmacist Pager [phone] 08/18/2016 6:38 PM

## 2016-08-19 ENCOUNTER — Encounter (HOSPITAL_COMMUNITY): Payer: Self-pay | Admitting: Gastroenterology

## 2016-08-19 ENCOUNTER — Observation Stay (HOSPITAL_COMMUNITY): Payer: Medicare Other

## 2016-08-19 DIAGNOSIS — I9589 Other hypotension: Secondary | ICD-10-CM | POA: Diagnosis not present

## 2016-08-19 DIAGNOSIS — E46 Unspecified protein-calorie malnutrition: Secondary | ICD-10-CM | POA: Diagnosis present

## 2016-08-19 DIAGNOSIS — A419 Sepsis, unspecified organism: Secondary | ICD-10-CM | POA: Diagnosis present

## 2016-08-19 DIAGNOSIS — R6521 Severe sepsis with septic shock: Secondary | ICD-10-CM

## 2016-08-19 DIAGNOSIS — Z800 Family history of malignant neoplasm of digestive organs: Secondary | ICD-10-CM | POA: Diagnosis not present

## 2016-08-19 DIAGNOSIS — Z515 Encounter for palliative care: Secondary | ICD-10-CM | POA: Diagnosis not present

## 2016-08-19 DIAGNOSIS — Z820 Family history of epilepsy and other diseases of the nervous system: Secondary | ICD-10-CM | POA: Diagnosis not present

## 2016-08-19 DIAGNOSIS — I959 Hypotension, unspecified: Secondary | ICD-10-CM | POA: Diagnosis not present

## 2016-08-19 DIAGNOSIS — Z950 Presence of cardiac pacemaker: Secondary | ICD-10-CM | POA: Diagnosis not present

## 2016-08-19 DIAGNOSIS — R195 Other fecal abnormalities: Secondary | ICD-10-CM

## 2016-08-19 DIAGNOSIS — K922 Gastrointestinal hemorrhage, unspecified: Secondary | ICD-10-CM | POA: Diagnosis not present

## 2016-08-19 DIAGNOSIS — K920 Hematemesis: Secondary | ICD-10-CM | POA: Diagnosis not present

## 2016-08-19 DIAGNOSIS — I5032 Chronic diastolic (congestive) heart failure: Secondary | ICD-10-CM | POA: Diagnosis present

## 2016-08-19 DIAGNOSIS — F419 Anxiety disorder, unspecified: Secondary | ICD-10-CM | POA: Diagnosis present

## 2016-08-19 DIAGNOSIS — N210 Calculus in bladder: Secondary | ICD-10-CM | POA: Diagnosis present

## 2016-08-19 DIAGNOSIS — J9601 Acute respiratory failure with hypoxia: Secondary | ICD-10-CM | POA: Diagnosis present

## 2016-08-19 DIAGNOSIS — J690 Pneumonitis due to inhalation of food and vomit: Secondary | ICD-10-CM | POA: Diagnosis present

## 2016-08-19 DIAGNOSIS — K311 Adult hypertrophic pyloric stenosis: Secondary | ICD-10-CM | POA: Diagnosis present

## 2016-08-19 DIAGNOSIS — N390 Urinary tract infection, site not specified: Secondary | ICD-10-CM | POA: Diagnosis present

## 2016-08-19 DIAGNOSIS — G4733 Obstructive sleep apnea (adult) (pediatric): Secondary | ICD-10-CM | POA: Diagnosis present

## 2016-08-19 DIAGNOSIS — Z888 Allergy status to other drugs, medicaments and biological substances status: Secondary | ICD-10-CM | POA: Diagnosis not present

## 2016-08-19 DIAGNOSIS — D72829 Elevated white blood cell count, unspecified: Secondary | ICD-10-CM | POA: Diagnosis not present

## 2016-08-19 DIAGNOSIS — K3189 Other diseases of stomach and duodenum: Secondary | ICD-10-CM | POA: Diagnosis not present

## 2016-08-19 DIAGNOSIS — Z79899 Other long term (current) drug therapy: Secondary | ICD-10-CM | POA: Diagnosis not present

## 2016-08-19 DIAGNOSIS — I110 Hypertensive heart disease with heart failure: Secondary | ICD-10-CM | POA: Diagnosis present

## 2016-08-19 DIAGNOSIS — Z933 Colostomy status: Secondary | ICD-10-CM | POA: Diagnosis not present

## 2016-08-19 DIAGNOSIS — R1110 Vomiting, unspecified: Secondary | ICD-10-CM | POA: Diagnosis present

## 2016-08-19 DIAGNOSIS — I480 Paroxysmal atrial fibrillation: Secondary | ICD-10-CM | POA: Diagnosis present

## 2016-08-19 DIAGNOSIS — Z681 Body mass index (BMI) 19 or less, adult: Secondary | ICD-10-CM | POA: Diagnosis not present

## 2016-08-19 DIAGNOSIS — Z7189 Other specified counseling: Secondary | ICD-10-CM | POA: Diagnosis not present

## 2016-08-19 DIAGNOSIS — K2971 Gastritis, unspecified, with bleeding: Secondary | ICD-10-CM | POA: Diagnosis present

## 2016-08-19 DIAGNOSIS — Z8249 Family history of ischemic heart disease and other diseases of the circulatory system: Secondary | ICD-10-CM | POA: Diagnosis not present

## 2016-08-19 DIAGNOSIS — R6520 Severe sepsis without septic shock: Secondary | ICD-10-CM | POA: Diagnosis not present

## 2016-08-19 DIAGNOSIS — Z7901 Long term (current) use of anticoagulants: Secondary | ICD-10-CM | POA: Diagnosis not present

## 2016-08-19 DIAGNOSIS — Z87891 Personal history of nicotine dependence: Secondary | ICD-10-CM | POA: Diagnosis not present

## 2016-08-19 DIAGNOSIS — I4891 Unspecified atrial fibrillation: Secondary | ICD-10-CM | POA: Diagnosis not present

## 2016-08-19 DIAGNOSIS — L8990 Pressure ulcer of unspecified site, unspecified stage: Secondary | ICD-10-CM | POA: Insufficient documentation

## 2016-08-19 LAB — URINALYSIS, ROUTINE W REFLEX MICROSCOPIC
BILIRUBIN URINE: NEGATIVE
GLUCOSE, UA: NEGATIVE mg/dL
Ketones, ur: NEGATIVE mg/dL
Nitrite: NEGATIVE
PH: 6 (ref 5.0–8.0)
Protein, ur: 100 mg/dL — AB
SPECIFIC GRAVITY, URINE: 1.009 (ref 1.005–1.030)

## 2016-08-19 LAB — CBC
HCT: 39.3 % (ref 39.0–52.0)
HCT: 32.4 % — ABNORMAL LOW (ref 39.0–52.0)
HEMOGLOBIN: 13.4 g/dL (ref 13.0–17.0)
Hemoglobin: 11.1 g/dL — ABNORMAL LOW (ref 13.0–17.0)
MCH: 28.8 pg (ref 26.0–34.0)
MCH: 28.9 pg (ref 26.0–34.0)
MCHC: 34.1 g/dL (ref 30.0–36.0)
MCHC: 34.3 g/dL (ref 30.0–36.0)
MCV: 84.5 fL (ref 78.0–100.0)
MCV: 84.4 fL (ref 78.0–100.0)
PLATELETS: 350 10*3/uL (ref 150–400)
Platelets: 209 10*3/uL (ref 150–400)
RBC: 4.65 MIL/uL (ref 4.22–5.81)
RBC: 3.84 MIL/uL — ABNORMAL LOW (ref 4.22–5.81)
RDW: 15.8 % — ABNORMAL HIGH (ref 11.5–15.5)
RDW: 15.9 % — ABNORMAL HIGH (ref 11.5–15.5)
WBC: 31.2 10*3/uL — AB (ref 4.0–10.5)
WBC: 34.4 10*3/uL — ABNORMAL HIGH (ref 4.0–10.5)

## 2016-08-19 LAB — COMPREHENSIVE METABOLIC PANEL
ALBUMIN: 3.5 g/dL (ref 3.5–5.0)
ALT: 12 U/L — ABNORMAL LOW (ref 17–63)
ANION GAP: 14 (ref 5–15)
AST: 25 U/L (ref 15–41)
Alkaline Phosphatase: 61 U/L (ref 38–126)
BUN: 32 mg/dL — AB (ref 6–20)
CHLORIDE: 93 mmol/L — AB (ref 101–111)
CO2: 32 mmol/L (ref 22–32)
Calcium: 9.3 mg/dL (ref 8.9–10.3)
Creatinine, Ser: 1.41 mg/dL — ABNORMAL HIGH (ref 0.61–1.24)
GFR calc Af Amer: 52 mL/min — ABNORMAL LOW (ref >60)
GFR calc non Af Amer: 45 mL/min — ABNORMAL LOW (ref >60)
GLUCOSE: 173 mg/dL — AB (ref 65–99)
POTASSIUM: 4.2 mmol/L (ref 3.5–5.1)
SODIUM: 139 mmol/L (ref 135–145)
Total Bilirubin: 0.8 mg/dL (ref 0.3–1.2)
Total Protein: 6.5 g/dL (ref 6.5–8.1)

## 2016-08-19 LAB — PREPARE FRESH FROZEN PLASMA: Unit division: 0

## 2016-08-19 LAB — BPAM FFP
BLOOD PRODUCT EXPIRATION DATE: 201803052030
ISSUE DATE / TIME: 201803042220
UNIT TYPE AND RH: 6200

## 2016-08-19 LAB — PROTIME-INR
INR: 3
PROTHROMBIN TIME: 31.8 s — AB (ref 11.4–15.2)

## 2016-08-19 LAB — LACTIC ACID, PLASMA
Lactic Acid, Venous: 3.1 mmol/L (ref 0.5–1.9)
Lactic Acid, Venous: 2.1 mmol/L (ref 0.5–1.9)

## 2016-08-19 MED ORDER — VITAMIN K1 10 MG/ML IJ SOLN: 10.0000 mg | Freq: Once | INTRAMUSCULAR | Status: DC

## 2016-08-19 MED ORDER — PANTOPRAZOLE SODIUM 40 MG IV SOLR
40.0000 mg | Freq: Two times a day (BID) | INTRAVENOUS | Status: DC
  Administered 2016-08-19 – 2016-08-24 (×10): 40 mg via INTRAVENOUS
  Filled 2016-08-19 (×10): qty 40

## 2016-08-19 MED ORDER — NOREPINEPHRINE BITARTRATE 1 MG/ML IV SOLN
0.0000 ug/min | INTRAVENOUS | Status: DC
  Administered 2016-08-19: 2 ug/min via INTRAVENOUS
  Administered 2016-08-19 (×2): 11 ug/min via INTRAVENOUS
  Administered 2016-08-20: 6 ug/min via INTRAVENOUS
  Administered 2016-08-20: 11 ug/min via INTRAVENOUS
  Administered 2016-08-21: 5 ug/min via INTRAVENOUS
  Administered 2016-08-21: 2.5 ug/min via INTRAVENOUS
  Filled 2016-08-19 (×5): qty 4

## 2016-08-19 MED ORDER — SODIUM CHLORIDE 0.9 % IV BOLUS (SEPSIS)
250.0000 mL | Freq: Once | INTRAVENOUS | Status: AC
  Administered 2016-08-19: 250 mL via INTRAVENOUS

## 2016-08-19 MED ORDER — NOREPINEPHRINE BITARTRATE 1 MG/ML IV SOLN
INTRAVENOUS | Status: AC
  Filled 2016-08-19: qty 4

## 2016-08-19 MED ORDER — VITAMIN K1 10 MG/ML IJ SOLN
10.0000 mg | Freq: Once | INTRAVENOUS | Status: AC
  Administered 2016-08-19: 10 mg via INTRAVENOUS
  Filled 2016-08-19: qty 1

## 2016-08-19 NOTE — Progress Notes (Signed)
NG tube placement procedure explained to patient and patient's son. NG tube placed, patient tolerated well. Tube hooked to low-intermittent suction.

## 2016-08-19 NOTE — Progress Notes (Addendum)
PROGRESS NOTE                                                                                                                                                                                                             Patient Demographics:    Jon [name], is a 83 y.o. male, DOB - 25-Aug-1931, ZOX:[id number]  Admit date - 08/18/2016   Admitting Physician Meredeth IdeGagan S Lama, MD  Outpatient Primary MD for the patient is Milana ObeyStephen D Knowlton, MD  LOS - 0  Outpatient Specialists: Cardiology Dr. Loney Heringritoru, GI Dr Darrick PennaFields.  Chief Complaint  Patient presents with  . Emesis       Brief Narrative   83 y.o. male, with a past medical history significant for PAF On warfarin , tachybradycardia syndrome status post pacemaker, chronic diastolic CHF, neuropathy, OSA, HTN, anxiety, s/p diverting colostomy & indwelling Foley due to Sacral Ulcer, and ambulatory at bedside, bedbound, patient was brought to the hospital due to several episodes of coffee-ground emesis, patient with known colostomy has not been draining much for few days, CT abdomen pelvis with markedly distended stomach possible gastric outlet obstruction versus severe gastroparesis, as well multiple stones in the urinary bladder possible cystitis, patient was found to have septic shock, required to ICU and started on pressors.   Subjective:    Jon [name] today has, No headache, No chest pain, No abdominal pain or Shortness of breath, report generalized weakness and fatigue.   Assessment  & Plan :    Active Problems:   Hypotension   UGI bleed   Pressure injury of skin   Septic shock - Hypotensive, with leukocytosis, and if continued elevated lactic acid, requiring IV pressors. - Received IV fluids, currently with hypoxia, so no further fluid resuscitation secondary to volume overload, will insert PICC line and check CVP to guide fluid resuscitation. - Continue with IV Levophed for map of 65, PICC  line  insertion requested. - Follow on blood cultures, so far no growth to date - 8 x-ray this a.m. with evidence of right lower lobe pneumonia, most likely aspiration pneumonia, already on vac and Zosyn - Sacral  pressure ulcer appears to be doing nicely with no evidence of infection - Sepsis-most likely related to urinary source, continue with broad-spectrum IV vancomycin and Zosyn, follow on urine cultures, fully catheter has been changed. -  Abdominal exam is benign, no evidence of infectious source besides cystitis and CT abdomen and pelvis  Vomiting(coffee-ground emesis?) - Ports of coffee-ground emesis, overall hemoglobin has been stable, unclear if upper GI bleed versus regular vomiting from gastric outlet obstruction. - Monitor H&H closely, continue with IV Protonix drip, and transfuse as needed. - GI consult pending - Will insert NG tube on ILS  Acute hypoxic respiratory failure - Currently on 10 L high flow nasal cannula, and history of chronic diastolic CHF, but no significant volume overload on physical exam, his x-ray today with evidence of right lower lobe pneumonia most likely aspiration pneumonia from vomiting .  Aspiration pneumonia - Continue with vancomycin and Zosyn  Chronic diastolic CHF - Recent echo on 16/1096 with EF 65%, grade 2 diastolic dysfunction.  Atrial fibrillation/tachybradycardia syndrome - Patient with known history of A. Fib, on warfarin supratherapeutic INR presentation of 3.14. - Continue to hold warfarin for presumed GI bleed, continue to hold sotalol for hypertension, goes and RVR will need to start amiodarone amiodarone and digoxin.  History of hypertension - Lisinopril and sotalol given septic shock  Coagulopathy - Both therapeutic INR on admission 3.1 ,received  vitamin K and FFP, INR level still 3 this a.m., will give another 10 mg of IV vitamin K, will check INR daily.  Pressure ulcer - Appears to be healing nicely, almost closed no discharge or  evidence of infection on physical exam    Code Status : Full  Family Communication  : Son at bedside  Disposition Plan  : remains in ICU.  Consults  :  GI  Procedures  : None  DVT Prophylaxis  :   SCDs   Lab Results  Component Value Date   PLT 350 08/19/2016    Antibiotics  :    Anti-infectives    Start     Dose/Rate Route Frequency Ordered Stop   08/19/16 0500  vancomycin (VANCOCIN) IVPB 1000 mg/200 mL premix     1,000 mg 200 mL/hr over 60 Minutes Intravenous Every 24 hours 08/18/16 1845     08/18/16 2300  piperacillin-tazobactam (ZOSYN) IVPB 3.375 g     3.375 g 12.5 mL/hr over 240 Minutes Intravenous Every 8 hours 08/18/16 1845     08/18/16 1700  piperacillin-tazobactam (ZOSYN) IVPB 3.375 g     3.375 g 100 mL/hr over 30 Minutes Intravenous  Once 08/18/16 1652 08/18/16 1729   08/18/16 1700  vancomycin (VANCOCIN) IVPB 1000 mg/200 mL premix     1,000 mg 200 mL/hr over 60 Minutes Intravenous  Once 08/18/16 1652 08/18/16 1833        Objective:   Vitals:   08/19/16 0000 08/19/16 0600 08/19/16 0700 08/19/16 0800  BP:  99/74 106/86 (!) 89/74  Pulse:  73 66 64  Resp:  (!) 26 (!) 27 (!) 26  Temp: 97.4 F (36.3 C)   98.1 F (36.7 C)  TempSrc: Axillary   Oral  SpO2:  96% 96% 96%  Weight:      Height:        Wt Readings from Last 3 Encounters:  08/18/16 56.3 kg (124 lb 1.9 oz)  05/11/16 62 kg (136 lb 11 oz)  05/05/16 68.2 kg (150 lb 5.7 oz)     Intake/Output Summary (Last 24 hours) at 08/19/16 0919 Last data filed at 08/19/16 0454  Gross per 24 hour  Intake          4285.04 ml  Output  100 ml  Net          4185.04 ml     Physical Exam  Awake Alert, Oriented X 3, Frail, chronically ill-appearing Disheveled, long nails Supple Neck,No JVD, .  Symmetrical Chest wall movement, diminished air movement bilaterally, no wheezing Irreg,No Gallops,Rubs or new Murmurs, No Parasternal Heave +ve B.Sounds, Abd Soft, No tenderness, No rebound - guarding  or rigidity, has colostomy bag which is empty Long nails, poor skin condition, hyperkeratosis, saccular pressure ulcer which is unstageable as it is healing and closed, no evidence of discharge or infection    Data Review:    CBC  Recent Labs Lab 08/18/16 1521 08/19/16 0317  WBC 23.2* 31.2*  HGB 15.8 13.4  HCT 45.9 39.3  PLT 379 350  MCV 84.1 84.5  MCH 28.9 28.8  MCHC 34.4 34.1  RDW 15.6* 15.8*  LYMPHSABS 0.9  --   MONOABS 0.9  --   EOSABS 0.0  --   BASOSABS 0.0  --     Chemistries   Recent Labs Lab 08/18/16 1521 08/19/16 0317  NA 136 139  K 4.9 4.2  CL 88* 93*  CO2 36* 32  GLUCOSE 201* 173*  BUN 31* 32*  CREATININE 1.34* 1.41*  CALCIUM 9.6 9.3  AST 27 25  ALT 11* 12*  ALKPHOS 74 61  BILITOT 0.9 0.8   ------------------------------------------------------------------------------------------------------------------ No results for input(s): CHOL, HDL, LDLCALC, TRIG, CHOLHDL, LDLDIRECT in the last 72 hours.  No results found for: HGBA1C ------------------------------------------------------------------------------------------------------------------ No results for input(s): TSH, T4TOTAL, T3FREE, THYROIDAB in the last 72 hours.  Invalid input(s): FREET3 ------------------------------------------------------------------------------------------------------------------ No results for input(s): VITAMINB12, FOLATE, FERRITIN, TIBC, IRON, RETICCTPCT in the last 72 hours.  Coagulation profile  Recent Labs Lab 08/18/16 1521 08/19/16 0317  INR 3.14 3.00    No results for input(s): DDIMER in the last 72 hours.  Cardiac Enzymes No results for input(s): CKMB, TROPONINI, MYOGLOBIN in the last 168 hours.  Invalid input(s): CK ------------------------------------------------------------------------------------------------------------------ No results found for: BNP  Inpatient Medications  Scheduled Meds: . sodium chloride   Intravenous STAT  . phytonadione  (VITAMIN K) IV  10 mg Intravenous Once  . piperacillin-tazobactam (ZOSYN)  IV  3.375 g Intravenous Q8H  . vancomycin  1,000 mg Intravenous Q24H   Continuous Infusions: . sodium chloride Stopped (08/18/16 2300)  . norepinephrine (LEVOPHED) Adult infusion 10 mcg/min (08/19/16 1610)  . pantoprozole (PROTONIX) infusion 8 mg/hr (08/18/16 2336)   PRN Meds:.acetaminophen **OR** acetaminophen, promethazine  Micro Results Recent Results (from the past 240 hour(s))  Blood Culture (routine x 2)     Status: None (Preliminary result)   Collection Time: 08/18/16  3:23 PM  Result Value Ref Range Status   Specimen Description LEFT ANTECUBITAL  Final   Special Requests BOTTLES DRAWN AEROBIC AND ANAEROBIC [facility] EACH  Final   Culture PENDING  Incomplete   Report Status PENDING  Incomplete  Blood Culture (routine x 2)     Status: None (Preliminary result)   Collection Time: 08/18/16  3:53 PM  Result Value Ref Range Status   Specimen Description LEFT ANTECUBITAL  Final   Special Requests BOTTLES DRAWN AEROBIC AND ANAEROBIC 8CC EACH  Final   Culture PENDING  Incomplete   Report Status PENDING  Incomplete  MRSA PCR Screening     Status: Abnormal   Collection Time: 08/18/16  8:19 PM  Result Value Ref Range Status   MRSA by PCR POSITIVE (A) NEGATIVE Final    Comment:  The GeneXpert MRSA Assay (FDA approved for NASAL specimens only), is one component of a comprehensive MRSA colonization surveillance program. It is not intended to diagnose MRSA infection nor to guide or monitor treatment for MRSA infections. RESULT CALLED TO, READ BACK BY AND VERIFIED WITH:  HEARN,J @ 2342 ON 08/18/16 BY JUW     Radiology Reports Ct Abdomen Pelvis Wo Contrast  Result Date: 08/18/2016 CLINICAL DATA:  Weakness, vomiting, and hypotension. EXAM: CT ABDOMEN AND PELVIS WITHOUT CONTRAST TECHNIQUE: Multidetector CT imaging of the abdomen and pelvis was performed following the standard protocol without IV contrast.  COMPARISON:  CT scans since June 15, 2014 FINDINGS: Lower chest: The small left pleural effusion is decreased in size in the interval. The right-sided pleural effusion has resolved. There is fluid in the distal esophagus. Coronary artery calcifications are noted. Mild dependent atelectasis. No acute abnormalities in the lung bases. Hepatobiliary: Cholelithiasis is identified. The liver is unchanged. Pancreas: Unremarkable. No pancreatic ductal dilatation or surrounding inflammatory changes. Spleen: Normal in size without focal abnormality. Adrenals/Urinary Tract: Multiple simple and hyperdense renal cysts are seen, unchanged. The largest is on the right measuring at least 11 cm in transverse dimension. Vascular calcifications are associated with the kidneys. There are also renal stones. The stone in the right renal pelvis is larger in the interval measuring 17 by 9 mm. There is no resulting hydronephrosis but there is mild increased attenuation of fat adjacent to the right renal pelvis. Remainder of the right ureter is normal in caliber. No left-sided stones. The left ureter is normal. There is a Foley catheter in the bladder. Multiple stones are seen primarily dependently in the bladder. There is mild stranding adjacent to the bladder which could be seen with cystitis. This finding is similar in the interval however. Stomach/Bowel: The stomach is markedly distended. This finding is worsened in the interval. 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 The duodenum itself is normal in caliber. The remainder of the small bowel is normal in caliber as well. The patient is status post partial colectomy. There is a left lower quadrant ostomy. The remainder of the colon is unremarkable. Scattered diverticuli without diverticulitis. No evidence of appendicitis. Vascular/Lymphatic: Atherosclerotic change is seen in the non aneurysmal aorta. No adenopathy. Reproductive: Prostate is unremarkable.  Other: Mild increased attenuation in the fat of the pericolic gutters is stable as is the mild increased attenuation anterior to the sacrum. Musculoskeletal: No interval bony changes identified. IMPRESSION: 1. Markedly distended stomach, worsened in the interval. This could be seen with gastric outlet obstruction or severe gastroparesis. 2. 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 3. Cholelithiasis. 4. Multiple renal cysts. The stone in the right renal pelvis has increased in the interval and there is now mild increased attenuation of fat adjacent to the right renal pelvis which could be due to intermittent obstruction. No hydronephrosis on today's study. 5. Multiple stones in the bladder. Mild fat stranding adjacent to the bladder could be due to cystitis. Recommend correlation with urinalysis to assess for UTI. 6. Atherosclerosis. 7. Diverticulosis. Electronically Signed   By: Gerome Sam III M.D   On: 08/18/2016 17:58   Dg Chest Port 1 View  Result Date: 08/18/2016 CLINICAL DATA:  Hypotension and vomiting today.  05/10/2016 EXAM: PORTABLE CHEST 1 VIEW COMPARISON:  None. FINDINGS: Left-sided transvenous pacemaker leads to the right atrium and right ventricle.  Heart is mildly enlarged. Aorta is tortuous and partially calcified. Lungs are clear. No pulmonary edema. No free intraperitoneal air beneath the diaphragm. IMPRESSION: Stable cardiomegaly.  No evidence for acute pulmonary abnormality. Electronically Signed   By: Norva Pavlov M.D.   On: 08/18/2016 15:30   Dg Abd 2 Views  Result Date: 08/18/2016 CLINICAL DATA:  Nausea and vomiting. History of gastritis and hypertension. EXAM: ABDOMEN - 2 VIEW COMPARISON:  05/04/2016 FINDINGS: Two supine views. The more cephalad imaged excludes the far left-sided abdomen. Nasogastric tube has been removed. Moderate-to-marked gaseous distension of the stomach. No gross free intraperitoneal  air. No bowel distension. Osteopenia. Right hip osteoarthritis. IMPRESSION: Gaseous distension of the stomach, nonspecific.  CT pending. Electronically Signed   By: Jeronimo Greaves M.D.   On: 08/18/2016 16:57    Critical care time 45 minutes   Diar Berkel M.D on 08/19/2016 at 9:19 AM  Between 7am to 7pm - Pager - [phone]  After 7pm go to [email or web address] - password [facility]  [facility] -  Office  [phone]

## 2016-08-19 NOTE — Progress Notes (Signed)
CTSP re crackles on lung exam. Chart reviewed, pat seen. 83 yo with anticoagulation admitted for UGIB.  INR 3, Hb 15 grams per dL, but persistent hypotension since admission. He has gotten 3L of IVF now, and concerned that he has persistent hypotension, and unable to tolerate significantly more IVF. His urinary output is about 10cc per hour, and his pulse is weak, so SBP is likely real. He was thought to have sepsis, with elevated lactic acid, and was started on IV antibiotics.  His coagulopathy was reversed with FFP and Vit K. He is not having active bleeding at this time. Will start him on pressor at this time, as I think he has enough IVF and he has true renal hypoperfusion. He is otherwise alert and in conversing.  No sign of avert shock. Will repeat CBC, comprehensive, and INR>   Houston SirenPeter Izek Corvino MD FACP. Hospitalist.

## 2016-08-19 NOTE — Progress Notes (Signed)
Critical lactic acid of 2.1 reported to Dr. Randol KernElgergawy.

## 2016-08-19 NOTE — Consult Note (Signed)
Referring Provider: Dr. Sharl MaLama  Primary Care Physician:  Milana ObeyStephen D Knowlton, MD Primary Gastroenterologist:  Dr. Darrick PennaFields   Date of Admission: 08/18/16 Date of Consultation: 08/19/16  Reason for Consultation:  Hematemesis   HPI:  Jon CutterWilliam P [name] is an 83 y.o. year old male with multiple co-morbidities, presenting with several episodes of coffee-ground emesis at home. Found to be hypotensive in the ED with SBP in 80s, concern for UTI and started on Vanc IV and Zosyn empirically. On Coumadin for afib, with INR 3.14 on admission, down to 3.00 today. Hgb 15.8 on admission, 13.4 today. Appears baseline Hgb around 10 range, with last documented Hgb through epic 10.4 when discharged in Nov 2017. WBC increased to 31.2 today, 23.2 on admission. Due to hypotension, started on levophed early this morning.   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 Prior CT scans with stomach distension. History of diverting colostomy in 2016 due to sacral decubitus ulcer. EGD in Nov 2016 during admission due to hematemesis, with findings of few linear erosions/ulcerastions in distal esophagus, non-erosive gastritis, no duodenal abnormalities. Path with chronic gastritis, negative H.pylori.   Son at bedside, stating acute onset of N/V after eating BBQ, cornbread, salmon cakes with stuffing from a church member. Prior hospitalizations with N/V have been precipitated by oral intake not consistent with his baseline, as he states he normally eats soft food. Notes suprapubic pain. Nausea has improved since admission. No melena in ostomy output. Endorses taking Ibuprofen intermittently. Not taking PPI as he should, stating he "doesn't think it helps". No GERD symptoms at home. Son states he would only take the PPI when asked, and it was rarely BID. Some days would skip altogether. Ostomy output has decreased recently but no diarrhea prior to  admission. Stool was soft. NG tube ordered to low, intermittent suction. Patient on 15 liters O2 per nasal cannula currently. PICC line to be placed later today. CXR ordered.   He has lost at least 25 lbs since Nov 2017 admission.   Past Medical History:  Diagnosis Date  . Abnormality of gait   . Arthritis   . B12 deficiency   . Bilateral hydronephrosis   . Bladder calculi   . Bleeding ulcer   . Dysrhythmia   . Essential hypertension   . Essential tremor   . Gastritis   . Macular degeneration of left eye   . Memory loss   . Multiple rib fractures   . Neuropathy (HCC)   . OSA (obstructive sleep apnea)    Does not use CPAP  . PAF (paroxysmal atrial fibrillation) (HCC)   . Presence of permanent cardiac pacemaker   . Tachycardia-bradycardia syndrome (HCC)    [employer] PPM - Dr. Royann Shiversroitoru  . Varicose veins    Bilateral    Past Surgical History:  Procedure Laterality Date  . BRAIN SURGERY    . CATARACT EXTRACTION Bilateral 2013  . COLON SURGERY    . COLOSTOMY N/A 07/11/2014   Procedure: DIVERTING COLOSTOMY;  Surgeon: Dalia HeadingMark A Jenkins, MD;  Location: [facility];  Service: General;  Laterality: N/A;  wound class: clean contaminated  . ESOPHAGOGASTRODUODENOSCOPY N/A 05/16/2015   Dr. Darrick PennaFields: few linear erosions/ulcerastions in distal esophagus, non-erosive gastritis, no duodenal abnormalities. Path with chronic gastritis, negative H.pylori.   . INCISION AND DRAINAGE OF WOUND N/A 07/11/2014   Procedure: DEBRIDEMENT OF SACRUM;  Surgeon: Dalia HeadingMark A Jenkins, MD;  Location: [facility];  Service: General;  Laterality: N/A;  . INSERT / REPLACE / REMOVE PACEMAKER    . MENINGIOMA REMOVAL  1992   POST RT FRONTAL   . PACEMAKER INSERTION  2006  . PERMANENT PACEMAKER GENERATOR CHANGE N/A 08/13/2012   Procedure: PERMANENT PACEMAKER GENERATOR CHANGE;  Surgeon: Thurmon Fair, MD;  Location: [facility];  Service: Cardiovascular;  Laterality: N/A;    Prior to Admission medications   Medication Sig Start Date  End Date Taking? Authorizing Provider  ALPRAZolam Prudy Feeler) 1 MG tablet Take 0.5 tablets (0.5 mg total) by mouth 2 (two) times daily as needed for anxiety. Patient taking differently: Take 0.5 mg by mouth 5 (five) times daily as needed for anxiety.  06/18/14  Yes Hollice Espy, MD  diphenhydrAMINE (BENADRYL) 25 MG tablet Take 25 mg by mouth every 6 (six) hours as needed for itching.   Yes Historical Provider, MD  HYDROcodone-acetaminophen (NORCO) 10-325 MG tablet Take 1 tablet by mouth every 6 (six) hours as needed for moderate pain.  05/04/15  Yes Historical Provider, MD  lisinopril (PRINIVIL,ZESTRIL) 10 MG tablet Take 10 mg by mouth daily.   Yes Historical Provider, MD  Multiple Vitamins-Minerals (ICAPS AREDS FORMULA PO) Take 1 tablet by mouth daily.   Yes Historical Provider, MD  nystatin (MYCOSTATIN/NYSTOP) powder Apply 1 application topically daily. 02/20/16  Yes Historical Provider, MD  pantoprazole (PROTONIX) 40 MG tablet Take 1 tablet (40 mg total) by mouth 2 (two) times daily before a meal. 05/18/15  Yes Erick Blinks, MD  potassium chloride SA (K-DUR,KLOR-CON) 20 MEQ tablet Take 1 tablet by mouth 2 (two) times daily. 05/08/16  Yes Historical Provider, MD  sotalol (BETAPACE) 160 MG tablet Take 1 tablet (160 mg total) by mouth 2 (two) times daily. Need appointment for future refills 06/06/15  Yes Mihai Croitoru, MD  Tetrahydrozoline HCl (EYE DROPS OP) Apply 2 drops to eye daily as needed (irritation).   Yes Historical Provider, MD  warfarin (COUMADIN) 2.5 MG tablet Take 2.5-5 mg by mouth daily. Take two tablets on Tuesdays and Thursdays and one on all other days   Yes Historical Provider, MD    Current Facility-Administered Medications  Medication Dose Route Frequency Provider Last Rate Last Dose  . 0.9 %  sodium chloride infusion   Intravenous Continuous Samuel Jester, DO   Stopped at 08/18/16 2300  . 0.9 %  sodium chloride infusion   Intravenous STAT Samuel Jester, DO      .  acetaminophen (TYLENOL) tablet 650 mg  650 mg Oral Q6H PRN Meredeth Ide, MD       Or  . acetaminophen (TYLENOL) suppository 650 mg  650 mg Rectal Q6H PRN Meredeth Ide, MD      . norepinephrine (LEVOPHED) 4 mg in dextrose 5 % 250 mL (0.016 mg/mL) infusion  0-40 mcg/min Intravenous Titrated Houston Siren, MD 37.5 mL/hr at 08/19/16 0635 10 mcg/min at 08/19/16 8119  . pantoprazole (PROTONIX) 80 mg in sodium chloride 0.9 % 250 mL (0.32 mg/mL) infusion  8 mg/hr Intravenous Continuous Samuel Jester, DO 25 mL/hr at 08/18/16 2336 8 mg/hr at 08/18/16 2336  . phytonadione (VITAMIN K) 10 mg in dextrose 5 % 50 mL IVPB  10 mg Intravenous Once Starleen Arms, MD      . piperacillin-tazobactam (ZOSYN) IVPB 3.375 g  3.375 g Intravenous Q8H Samuel Jester, DO   3.375 g at 08/19/16 1478  . promethazine (PHENERGAN) injection 12.5 mg  12.5 mg Intravenous Q6H PRN Meredeth Ide, MD      .  vancomycin (VANCOCIN) IVPB 1000 mg/200 mL premix  1,000 mg Intravenous Q24H Samuel Jester, DO   1,000 mg at 08/19/16 0506    Allergies as of 08/18/2016 - Review Complete 08/18/2016  Allergen Reaction Noted  . Iohexol  05/06/2006  . Shellfish allergy  09/22/2014  . Dilantin [phenytoin sodium extended] Rash 08/05/2012  . Tegretol [carbamazepine] Rash 08/05/2012    Family History  Problem Relation Age of Onset  . Aortic aneurysm Mother   . Parkinsonism Mother   . Heart disease Father   . Esophageal cancer Sister   . Colon cancer Neg Hx     Social History   Social History  . Marital status: Married    Spouse name: N/A  . Number of children: 2  . Years of education: COLLEGE   Occupational History  . RETIRED    Social History Main Topics  . Smoking status: Former Smoker    Packs/day: 0.75    Years: 25.00    Types: Cigarettes  . Smokeless tobacco: Never Used     Comment: QUIT 35 YRS AGO  . Alcohol use No  . Drug use: No  . Sexual activity: No   Other Topics Concern  . Not on file   Social History  Narrative  . No narrative on file    Review of Systems: Gen: see HPI  CV: Denies chest pain, heart palpitations, syncope, edema  Resp: +cough  GI: see HPI  GU : see HPI  MS: +joint pain (knees), bed bound  Psych: Denies depression, anxiety,confusion, or memory loss Heme: Denies bruising, bleeding, and enlarged lymph nodes.  Physical Exam: Vital signs in last 24 hours: Temp:  [97.4 F (36.3 C)-99.4 F (37.4 C)] 98.1 F (36.7 C) (03/05 0800) Pulse Rate:  [64-92] 64 (03/05 0800) Resp:  [15-29] 26 (03/05 0800) BP: (69-135)/(59-99) 89/74 (03/05 0800) SpO2:  [85 %-99 %] 96 % (03/05 0800) Weight:  [124 lb 1.9 oz (56.3 kg)-136 lb (61.7 kg)] 124 lb 1.9 oz (56.3 kg) (03/04 2103)   General:   Alert, appears chronically ill, frail-appearing Head:  Normocephalic and atraumatic. Eyes:  Sclera clear, no icterus.   Conjunctiva pink. Ears:  Normal auditory acuity. Mouth:  No deformity or lesions Lungs:  Clear to auscultation, diminished bases  Heart:  S1 S2 present without murmurs Abdomen:  +BS, mild distension but soft, no tenderness with palpation, no rebound or guarding. LLQ stom beefy red, no output noted, no obvious signs of melena.  Rectal:  Deferred  Msk:  All extremities thin, muscle wasting bilateral lower extremities  Extremities:  Without  edema. Neurologic:  Alert and  oriented x4 Skin:  Diffusely dry, scaly skin  Psych:  Alert and cooperative.   Intake/Output from previous day: 03/04 0701 - 03/05 0700 In: 4285 [I.V.:735; IV Piggyback:3550] Out: 100 [Urine:100] Intake/Output this shift: No intake/output data recorded.  Lab Results:  Recent Labs  08/18/16 1521 08/19/16 0317  WBC 23.2* 31.2*  HGB 15.8 13.4  HCT 45.9 39.3  PLT 379 350   BMET  Recent Labs  08/18/16 1521 08/19/16 0317  NA 136 139  K 4.9 4.2  CL 88* 93*  CO2 36* 32  GLUCOSE 201* 173*  BUN 31* 32*  CREATININE 1.34* 1.41*  CALCIUM 9.6 9.3   LFT  Recent Labs  08/18/16 1521  08/19/16 0317  PROT 6.9 6.5  ALBUMIN 3.7 3.5  AST 27 25  ALT 11* 12*  ALKPHOS 74 61  BILITOT 0.9 0.8   PT/INR  Recent  Labs  08/18/16 1521 08/19/16 0317  LABPROT 33.0* 31.8*  INR 3.14 3.00    Studies/Results: Ct Abdomen Pelvis Wo Contrast  Result Date: 08/18/2016 CLINICAL DATA:  Weakness, vomiting, and hypotension. EXAM: CT ABDOMEN AND PELVIS WITHOUT CONTRAST TECHNIQUE: Multidetector CT imaging of the abdomen and pelvis was performed following the standard protocol without IV contrast. COMPARISON:  CT scans since June 15, 2014 FINDINGS: Lower chest: The small left pleural effusion is decreased in size in the interval. The right-sided pleural effusion has resolved. There is fluid in the distal esophagus. Coronary artery calcifications are noted. Mild dependent atelectasis. No acute abnormalities in the lung bases. Hepatobiliary: Cholelithiasis is identified. The liver is unchanged. Pancreas: Unremarkable. No pancreatic ductal dilatation or surrounding inflammatory changes. Spleen: Normal in size without focal abnormality. Adrenals/Urinary Tract: Multiple simple and hyperdense renal cysts are seen, unchanged. The largest is on the right measuring at least 11 cm in transverse dimension. Vascular calcifications are associated with the kidneys. There are also renal stones. The stone in the right renal pelvis is larger in the interval measuring 17 by 9 mm. There is no resulting hydronephrosis but there is mild increased attenuation of fat adjacent to the right renal pelvis. Remainder of the right ureter is normal in caliber. No left-sided stones. The left ureter is normal. There is a Foley catheter in the bladder. Multiple stones are seen primarily dependently in the bladder. There is mild stranding adjacent to the bladder which could be seen with cystitis. This finding is similar in the interval however. Stomach/Bowel: The stomach is markedly distended. This finding is worsened in the interval.  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 The duodenum itself is normal in caliber. The remainder of the small bowel is normal in caliber as well. The patient is status post partial colectomy. There is a left lower quadrant ostomy. The remainder of the colon is unremarkable. Scattered diverticuli without diverticulitis. No evidence of appendicitis. Vascular/Lymphatic: Atherosclerotic change is seen in the non aneurysmal aorta. No adenopathy. Reproductive: Prostate is unremarkable. Other: Mild increased attenuation in the fat of the pericolic gutters is stable as is the mild increased attenuation anterior to the sacrum. Musculoskeletal: No interval bony changes identified. IMPRESSION: 1. Markedly distended stomach, worsened in the interval. This could be seen with gastric outlet obstruction or severe gastroparesis. 2. 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 3. Cholelithiasis. 4. Multiple renal cysts. The stone in the right renal pelvis has increased in the interval and there is now mild increased attenuation of fat adjacent to the right renal pelvis which could be due to intermittent obstruction. No hydronephrosis on today's study. 5. Multiple stones in the bladder. Mild fat stranding adjacent to the bladder could be due to cystitis. Recommend correlation with urinalysis to assess for UTI. 6. Atherosclerosis. 7. Diverticulosis. Electronically Signed   By: Gerome Sam III M.D   On: 08/18/2016 17:58   Dg Chest Port 1 View  Result Date: 08/18/2016 CLINICAL DATA:  Hypotension and vomiting today.  05/10/2016 EXAM: PORTABLE CHEST 1 VIEW COMPARISON:  None. FINDINGS: Left-sided transvenous pacemaker leads to the right atrium and right ventricle. Heart is mildly enlarged. Aorta is tortuous and partially calcified. Lungs are clear. No pulmonary edema. No free intraperitoneal air beneath the diaphragm. IMPRESSION: Stable  cardiomegaly.  No evidence for acute pulmonary abnormality. Electronically Signed   By: Norva Pavlov M.D.  On: 08/18/2016 15:30   Dg Abd 2 Views  Result Date: 08/18/2016 CLINICAL DATA:  Nausea and vomiting. History of gastritis and hypertension. EXAM: ABDOMEN - 2 VIEW COMPARISON:  05/04/2016 FINDINGS: Two supine views. The more cephalad imaged excludes the far left-sided abdomen. Nasogastric tube has been removed. Moderate-to-marked gaseous distension of the stomach. No gross free intraperitoneal air. No bowel distension. Osteopenia. Right hip osteoarthritis. IMPRESSION: Gaseous distension of the stomach, nonspecific.  CT pending. Electronically Signed   By: Jeronimo Greaves M.D.   On: 08/18/2016 16:57    Impression: 83 year old male with multiple medical issues, presenting with acute N/V, coffee-ground emesis on Coumadin as outpatient, heme positive stool from ostomy, sepsis in setting of likely UTI. He reports eating tougher textures that normally precipitate acute nausea and vomiting; he has a history of chronically dilated stomach with a 3 cm known mass adjacent to the duodenal bulb that has been stable since 2015. However, CT imaging notes worsening gastric distension in interval from prior CTs. EGD on file from Nov 2016 with esophageal erosions, non-erosive gastritis, negative H.pylori. He admits to taking Ibuprofen intermittently as outpatient, and he does not take PPI BID as directed, sometimes skipping doses.    Baseline Hgb appears in the 10 range after review of chart. N/V has subsided by time of consultation; NG tube will be placed shortly per nursing staff. Leukocytosis continues to worsen, and patient is on Levophed due to persistent hypotension. Empiric antibiotics (Zosyn and Vancomycin) started at time of admission. INR supratherapeutic and Vit K 10 mg given today, previously receiving Vit K 5 mg yesterday and 1 unit FFP in the ED. On Protonix drip. Coumadin on hold. Remains on high flow  oxygen.   No acute indication for EGD today but will continue to follow clinically; if improves, EGD could be considered for direct visualization and further assessment for gastric outlet obstruction in setting of known 3 cm stable mass adjacent to duodenal bulb (present on multiple prior imaging). Hematemesis likely secondary to gastritis, esophagitis, PUD, MW tear; it is notable that he endorses NSAID use and has not been on adequate PPI therapy as previously recommended.   Plan: PPI infusion  Monitor for further overt GI bleeding Follow H/H Absolute avoidance of NSAIDs in future Will continue to follow with you NG tube to be placed per nursing staff   Gelene Mink, ANP-BC [facility]      LOS: 0 days    08/19/2016, 9:24 AM

## 2016-08-19 NOTE — Progress Notes (Signed)
[facility] called about PICC line placement.

## 2016-08-19 NOTE — Progress Notes (Signed)
Critical lactic acid reported to Dr. Randol KernElgergawy. 250ml fluid bolus ordered, will continue NS at 11400ml/hr.

## 2016-08-19 NOTE — Progress Notes (Signed)
Inpatient Diabetes Program Recommendations  AACE/ADA: New Consensus Statement on Inpatient Glycemic Control (2015)  Target Ranges:  Prepandial:   less than 140 mg/dL      Peak postprandial:   less than 180 mg/dL (1-2 hours)      Critically ill patients:  140 - 180 mg/dL   Results for Luciano CutterLAMBERTH, Jash P (MRN [id number]) as of 08/19/2016 09:14  Ref. Range 08/18/2016 15:21 08/19/2016 03:17  Glucose Latest Ref Range: 65 - 99 mg/dL 409201 (H) 811173 (H)   Review of Glycemic Control  Diabetes history: No Outpatient Diabetes medications: NA Current orders for Inpatient glycemic control: None  Inpatient Diabetes Program Recommendations: Correction (SSI): Initial lab glucose 201 mg/dl and lab glucose 914173 mg/dl today. May want to consider ordering CBGs with Novolog correction scale. HgbA1C: Please consider ordering an A1C to evaluate glycemic control over the past 2-3 months.  Thanks, Orlando PennerMarie Brandelyn Henne, RN, MSN, CDE Diabetes Coordinator Inpatient Diabetes Program [phone] (Team Pager from 8am to 5pm)

## 2016-08-20 ENCOUNTER — Encounter (HOSPITAL_COMMUNITY): Payer: Self-pay | Admitting: Primary Care

## 2016-08-20 DIAGNOSIS — Z515 Encounter for palliative care: Secondary | ICD-10-CM

## 2016-08-20 DIAGNOSIS — Z7189 Other specified counseling: Secondary | ICD-10-CM

## 2016-08-20 DIAGNOSIS — R6520 Severe sepsis without septic shock: Secondary | ICD-10-CM

## 2016-08-20 LAB — BASIC METABOLIC PANEL
ANION GAP: 11 (ref 5–15)
BUN: 40 mg/dL — ABNORMAL HIGH (ref 6–20)
CALCIUM: 8.2 mg/dL — AB (ref 8.9–10.3)
CO2: 28 mmol/L (ref 22–32)
Chloride: 96 mmol/L — ABNORMAL LOW (ref 101–111)
Creatinine, Ser: 1.23 mg/dL (ref 0.61–1.24)
GFR, EST NON AFRICAN AMERICAN: 53 mL/min — AB (ref >60)
Glucose, Bld: 117 mg/dL — ABNORMAL HIGH (ref 65–99)
Potassium: 3 mmol/L — ABNORMAL LOW (ref 3.5–5.1)
SODIUM: 135 mmol/L (ref 135–145)

## 2016-08-20 LAB — CBC
HCT: 31.3 % — ABNORMAL LOW (ref 39.0–52.0)
Hemoglobin: 10.7 g/dL — ABNORMAL LOW (ref 13.0–17.0)
MCH: 29 pg (ref 26.0–34.0)
MCHC: 34.2 g/dL (ref 30.0–36.0)
MCV: 84.8 fL (ref 78.0–100.0)
Platelets: 226 10*3/uL (ref 150–400)
RBC: 3.69 MIL/uL — AB (ref 4.22–5.81)
RDW: 16.1 % — AB (ref 11.5–15.5)
WBC: 31.7 10*3/uL — AB (ref 4.0–10.5)

## 2016-08-20 LAB — GLUCOSE, CAPILLARY
GLUCOSE-CAPILLARY: 98 mg/dL (ref 65–99)
GLUCOSE-CAPILLARY: 93 mg/dL (ref 65–99)

## 2016-08-20 MED ORDER — CHLORHEXIDINE GLUCONATE CLOTH 2 % EX PADS
6.0000 | MEDICATED_PAD | Freq: Every day | CUTANEOUS | Status: DC
  Administered 2016-08-20 – 2016-08-25 (×6): 6 via TOPICAL

## 2016-08-20 MED ORDER — SODIUM CHLORIDE 0.9 % IR SOLN: 500.0000 mg | Freq: Four times a day (QID) | Status: DC

## 2016-08-20 MED ORDER — POTASSIUM CHLORIDE IN NACL 40-0.9 MEQ/L-% IV SOLN
INTRAVENOUS | Status: DC
  Administered 2016-08-20 – 2016-08-24 (×8): 100 mL/h via INTRAVENOUS

## 2016-08-20 MED ORDER — METRONIDAZOLE IN NACL 5-0.79 MG/ML-% IV SOLN
500.0000 mg | Freq: Three times a day (TID) | INTRAVENOUS | Status: DC
  Administered 2016-08-20 – 2016-08-21 (×4): 500 mg via INTRAVENOUS
  Filled 2016-08-20 (×4): qty 100

## 2016-08-20 MED ORDER — NOREPINEPHRINE BITARTRATE 1 MG/ML IV SOLN
INTRAVENOUS | Status: AC
  Filled 2016-08-20: qty 4

## 2016-08-20 NOTE — Plan of Care (Signed)
Problem: Skin Integrity: Goal: Risk for impaired skin integrity will decrease Outcome: Progressing Turning patient every 2 hours. Patient understands the need for this.

## 2016-08-20 NOTE — Progress Notes (Addendum)
PROGRESS NOTE                                                                                                                                                                                                             Patient Demographics:    Jon [name], is a 83 y.o. male, DOB - 03/18/1932, MPN:[id number]  Admit date - 08/18/2016   Admitting Physician Meredeth Ide, MD  Outpatient Primary MD for the patient is Milana Obey, MD  LOS - 1  Outpatient Specialists: Cardiology Dr. Loney Hering, GI Dr Darrick Penna.  Chief Complaint  Patient presents with  . Emesis       Brief Narrative   83 y.o. male, with a past medical history significant for PAF On warfarin , tachybradycardia syndrome status post pacemaker, chronic diastolic CHF, neuropathy, OSA, HTN, anxiety, s/p diverting colostomy & indwelling Foley due to Sacral Ulcer, and ambulatory at bedside, bedbound, patient was brought to the hospital due to several episodes of coffee-ground emesis, patient with known colostomy has not been draining much for few days, CT abdomen pelvis with markedly distended stomach possible gastric outlet obstruction versus severe gastroparesis, as well multiple stones in the urinary bladder possible cystitis, patient was found to have septic shock, required to ICU and started on pressors.   Subjective:    Jon [name] today has, No headache, No chest pain, No abdominal pain or Shortness of breath, report generalized weakness and fatigue.   Assessment  & Plan :    Active Problems:   Hypotension   UGI bleed   Pressure injury of skin   Gastric distention   Heme positive stool   Septic shock (HCC)   Septic shock - Hypotensive, with leukocytosis, and if continued elevated lactic acid, requiring IV pressors. - Received IV fluids, currently with hypoxia, so no further fluid resuscitation secondary to volume overload, will insert PICC line and check CVP to guide fluid  resuscitation. - Continue with IV Levophed for map of 65, PICC  line inserted, continue to wean Levophed, patient with low CVP yesterday, improving with IV fluids, CVP is 6-7 today, his hypotension is combination between sepsis and volume depletion. - Follow on blood cultures, so far no growth to date - Chest x-ray with evidence of right lower lobe pneumonia, most likely aspiration pneumonia, already on vac and Zosyn - Sacral  pressure ulcer appears to be healing nicely with no evidence of infection - Sepsis-most likely related to urinary source, and THC AP, continue with broad-spectrum IV vancomycin and Zosyn, follow on urine cultures, foley catheter has been changed. - Patient with no colostomy output, no diarrhea, he was C. difficile PCR +3 month ago, even how toxic is he will start empirically on IV Flagyl, with low threshold to stop.  Coffee-ground emesis - This is most likely related to his hepatitis, as per your EGD to 16 significant for esophagitis - GI input greatly appreciated, yet for EGD, monitor H&H closely and transfuse as needed, continue with IV Protonix 40 twice a day.  Gastric outlet obstruction - NGT inserted, with high output, 2800 mL output over last 24 hours, would consider TPN next 24 hours if remains with significant NG output.  Acute hypoxic respiratory failure - Currently on 10 L high flow nasal cannula, and history of chronic diastolic CHF, but no significant volume overload on physical exam, his x-ray today with evidence of right lower lobe pneumonia most likely aspiration pneumonia from vomiting .  Aspiration pneumonia - Continue with vancomycin and Zosyn  Chronic diastolic CHF - Recent echo on 41/324411/2017 with EF 65%, grade 2 diastolic dysfunction.  Atrial fibrillation/tachybradycardia syndrome - Patient with known history of A. Fib, on warfarin supratherapeutic INR presentation of 3.14. - Continue to hold warfarin for presumed GI bleed, continue to hold sotalol  for hypertension, goes and RVR will need to start amiodarone amiodarone and digoxin.  History of hypertension - Lisinopril and sotalol given septic shock  Coagulopathy - Both therapeutic INR on admission 3.1 ,received  vitamin K and FFP, recheck INR in a.m.   Hypokalemia - Repleted as added to IV fluids, check magnesium and phosphorus in a.m.Marland Kitchen.  Protein calorie malnutrition - May consider TPN  Pressure ulcer - Appears to be healing nicely, almost closed no discharge or evidence of infection on physical exam  Hyperglycemia - Check hemoglobin A1c, CBG every 6 time X4  Code Status : Full  Family Communication  : Son at bedside  Disposition Plan  : remains in ICU.  Consults  :  GI  Procedures  : None  DVT Prophylaxis  :   SCDs   Lab Results  Component Value Date   PLT 226 08/20/2016    Antibiotics  :    Anti-infectives    Start     Dose/Rate Route Frequency Ordered Stop   08/20/16 1100  metroNIDAZOLE (FLAGYL) IVPB 500 mg     500 mg 100 mL/hr over 60 Minutes Intravenous Every 8 hours 08/20/16 1052     08/20/16 0745  vancomycin (VANCOCIN) 500 mg in sodium chloride irrigation 0.9 % 100 mL ENEMA  Status:  Discontinued     500 mg Rectal Every 6 hours 08/20/16 0739 08/20/16 0739   08/19/16 0500  vancomycin (VANCOCIN) IVPB 1000 mg/200 mL premix     1,000 mg 200 mL/hr over 60 Minutes Intravenous Every 24 hours 08/18/16 1845     08/18/16 2300  piperacillin-tazobactam (ZOSYN) IVPB 3.375 g     3.375 g 12.5 mL/hr over 240 Minutes Intravenous Every 8 hours 08/18/16 1845     08/18/16 1700  piperacillin-tazobactam (ZOSYN) IVPB 3.375 g     3.375 g 100 mL/hr over 30 Minutes Intravenous  Once 08/18/16 1652 08/18/16 1729   08/18/16 1700  vancomycin (VANCOCIN) IVPB 1000 mg/200 mL premix     1,000 mg 200 mL/hr over 60 Minutes Intravenous  Once 08/18/16  1652 08/18/16 1833        Objective:   Vitals:   08/20/16 0749 08/20/16 0800 08/20/16 0900 08/20/16 1000  BP: 92/67 98/62 95/64   98/65  Pulse:  89 77 73  Resp: 18 19 14 16   Temp: 97.6 F (36.4 C)     TempSrc: Oral     SpO2: 100% 94% 100% 100%  Weight:      Height:        Wt Readings from Last 3 Encounters:  08/18/16 56.3 kg (124 lb 1.9 oz)  05/11/16 62 kg (136 lb 11 oz)  05/05/16 68.2 kg (150 lb 5.7 oz)     Intake/Output Summary (Last 24 hours) at 08/20/16 1055 Last data filed at 08/20/16 1000  Gross per 24 hour  Intake          3526.79 ml  Output             1500 ml  Net          2026.79 ml     Physical Exam  Awake Alert, Oriented X 3, Frail, chronically ill-appearing Disheveled, long nails Supple Neck,No JVD, .  Symmetrical Chest wall movement, diminished air movement bilaterally, no wheezing Irreg,No Gallops,Rubs or new Murmurs, No Parasternal Heave +ve B.Sounds, Abd Soft, No tenderness, No rebound - guarding or rigidity, has colostomy bag which is empty Long nails, poor skin condition, hyperkeratosis, saccular pressure ulcer which is unstageable as it is healing and closed, no evidence of discharge or infection    Data Review:    CBC  Recent Labs Lab 08/18/16 1521 08/19/16 0317 08/19/16 1751 08/20/16 0415  WBC 23.2* 31.2* 34.4* 31.7*  HGB 15.8 13.4 11.1* 10.7*  HCT 45.9 39.3 32.4* 31.3*  PLT 379 350 209 226  MCV 84.1 84.5 84.4 84.8  MCH 28.9 28.8 28.9 29.0  MCHC 34.4 34.1 34.3 34.2  RDW 15.6* 15.8* 15.9* 16.1*  LYMPHSABS 0.9  --   --   --   MONOABS 0.9  --   --   --   EOSABS 0.0  --   --   --   BASOSABS 0.0  --   --   --     Chemistries   Recent Labs Lab 08/18/16 1521 08/19/16 0317 08/20/16 0415  NA 136 139 135  K 4.9 4.2 3.0*  CL 88* 93* 96*  CO2 36* 32 28  GLUCOSE 201* 173* 117*  BUN 31* 32* 40*  CREATININE 1.34* 1.41* 1.23  CALCIUM 9.6 9.3 8.2*  AST 27 25  --   ALT 11* 12*  --   ALKPHOS 74 61  --   BILITOT 0.9 0.8  --    ------------------------------------------------------------------------------------------------------------------ No results for  input(s): CHOL, HDL, LDLCALC, TRIG, CHOLHDL, LDLDIRECT in the last 72 hours.  No results found for: HGBA1C ------------------------------------------------------------------------------------------------------------------ No results for input(s): TSH, T4TOTAL, T3FREE, THYROIDAB in the last 72 hours.  Invalid input(s): FREET3 ------------------------------------------------------------------------------------------------------------------ No results for input(s): VITAMINB12, FOLATE, FERRITIN, TIBC, IRON, RETICCTPCT in the last 72 hours.  Coagulation profile  Recent Labs Lab 08/18/16 1521 08/19/16 0317  INR 3.14 3.00    No results for input(s): DDIMER in the last 72 hours.  Cardiac Enzymes No results for input(s): CKMB, TROPONINI, MYOGLOBIN in the last 168 hours.  Invalid input(s): CK ------------------------------------------------------------------------------------------------------------------ No results found for: BNP  Inpatient Medications  Scheduled Meds: . Chlorhexidine Gluconate Cloth  6 each Topical Q0600  . metronidazole  500 mg Intravenous Q8H  . pantoprazole (PROTONIX) IV  40 mg  Intravenous Q12H  . piperacillin-tazobactam (ZOSYN)  IV  3.375 g Intravenous Q8H  . vancomycin  1,000 mg Intravenous Q24H   Continuous Infusions: . 0.9 % NaCl with KCl 40 mEq / L 100 mL/hr (08/20/16 0831)  . norepinephrine (LEVOPHED) Adult infusion 8 mcg/min (08/20/16 0635)   PRN Meds:.acetaminophen **OR** acetaminophen, promethazine  Micro Results Recent Results (from the past 240 hour(s))  Blood Culture (routine x 2)     Status: None (Preliminary result)   Collection Time: 08/18/16  3:23 PM  Result Value Ref Range Status   Specimen Description LEFT ANTECUBITAL  Final   Special Requests BOTTLES DRAWN AEROBIC AND ANAEROBIC 6CC EACH  Final   Culture NO GROWTH 2 DAYS  Final   Report Status PENDING  Incomplete  Blood Culture (routine x 2)     Status: None (Preliminary result)    Collection Time: 08/18/16  3:53 PM  Result Value Ref Range Status   Specimen Description LEFT ANTECUBITAL  Final   Special Requests BOTTLES DRAWN AEROBIC AND ANAEROBIC 8CC EACH  Final   Culture NO GROWTH 2 DAYS  Final   Report Status PENDING  Incomplete  MRSA PCR Screening     Status: Abnormal   Collection Time: 08/18/16  8:19 PM  Result Value Ref Range Status   MRSA by PCR POSITIVE (A) NEGATIVE Final    Comment:        The GeneXpert MRSA Assay (FDA approved for NASAL specimens only), is one component of a comprehensive MRSA colonization surveillance program. It is not intended to diagnose MRSA infection nor to guide or monitor treatment for MRSA infections. RESULT CALLED TO, READ BACK BY AND VERIFIED WITH:  HEARN,J @ 2342 ON 08/18/16 BY JUW     Radiology Reports Ct Abdomen Pelvis Wo Contrast  Result Date: 08/18/2016 CLINICAL DATA:  Weakness, vomiting, and hypotension. EXAM: CT ABDOMEN AND PELVIS WITHOUT CONTRAST TECHNIQUE: Multidetector CT imaging of the abdomen and pelvis was performed following the standard protocol without IV contrast. COMPARISON:  CT scans since June 15, 2014 FINDINGS: Lower chest: The small left pleural effusion is decreased in size in the interval. The right-sided pleural effusion has resolved. There is fluid in the distal esophagus. Coronary artery calcifications are noted. Mild dependent atelectasis. No acute abnormalities in the lung bases. Hepatobiliary: Cholelithiasis is identified. The liver is unchanged. Pancreas: Unremarkable. No pancreatic ductal dilatation or surrounding inflammatory changes. Spleen: Normal in size without focal abnormality. Adrenals/Urinary Tract: Multiple simple and hyperdense renal cysts are seen, unchanged. The largest is on the right measuring at least 11 cm in transverse dimension. Vascular calcifications are associated with the kidneys. There are also renal stones. The stone in the right renal pelvis is larger in the interval  measuring 17 by 9 mm. There is no resulting hydronephrosis but there is mild increased attenuation of fat adjacent to the right renal pelvis. Remainder of the right ureter is normal in caliber. No left-sided stones. The left ureter is normal. There is a Foley catheter in the bladder. Multiple stones are seen primarily dependently in the bladder. There is mild stranding adjacent to the bladder which could be seen with cystitis. This finding is similar in the interval however. Stomach/Bowel: The stomach is markedly distended. This finding is worsened in the interval. 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 The duodenum itself is normal in caliber. The remainder of the small bowel is normal in caliber as well. The patient is status post partial colectomy.  There is a left lower quadrant ostomy. The remainder of the colon is unremarkable. Scattered diverticuli without diverticulitis. No evidence of appendicitis. Vascular/Lymphatic: Atherosclerotic change is seen in the non aneurysmal aorta. No adenopathy. Reproductive: Prostate is unremarkable. Other: Mild increased attenuation in the fat of the pericolic gutters is stable as is the mild increased attenuation anterior to the sacrum. Musculoskeletal: No interval bony changes identified. IMPRESSION: 1. Markedly distended stomach, worsened in the interval. This could be seen with gastric outlet obstruction or severe gastroparesis. 2. 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 3. Cholelithiasis. 4. Multiple renal cysts. The stone in the right renal pelvis has increased in the interval and there is now mild increased attenuation of fat adjacent to the right renal pelvis which could be due to intermittent obstruction. No hydronephrosis on today's study. 5. Multiple stones in the bladder. Mild fat stranding adjacent to the bladder could be due to cystitis. Recommend correlation  with urinalysis to assess for UTI. 6. Atherosclerosis. 7. Diverticulosis. Electronically Signed   By: Gerome Sam III M.D   On: 08/18/2016 17:58   Dg Chest Port 1 View  Result Date: 08/19/2016 CLINICAL DATA:  Hypoxia. EXAM: PORTABLE CHEST 1 VIEW COMPARISON:  08/18/2016 and 05/10/2016 FINDINGS: NG tube tip is in the left upper quadrant the abdomen. Pacemaker in place. There is a patchy infiltrate at the right lung base. Left lung is clear. Heart size and vascularity are normal. Calcification in the arch of the aorta. No acute bone abnormality. IMPRESSION: New patchy infiltrate at the right lung base. Aortic atherosclerosis. Electronically Signed   By: Francene Boyers M.D.   On: 08/19/2016 09:48   Dg Chest Port 1 View  Result Date: 08/18/2016 CLINICAL DATA:  Hypotension and vomiting today.  05/10/2016 EXAM: PORTABLE CHEST 1 VIEW COMPARISON:  None. FINDINGS: Left-sided transvenous pacemaker leads to the right atrium and right ventricle. Heart is mildly enlarged. Aorta is tortuous and partially calcified. Lungs are clear. No pulmonary edema. No free intraperitoneal air beneath the diaphragm. IMPRESSION: Stable cardiomegaly.  No evidence for acute pulmonary abnormality. Electronically Signed   By: Norva Pavlov M.D.   On: 08/18/2016 15:30   Dg Chest Port 1v Same Day  Result Date: 08/19/2016 CLINICAL DATA:  PICC line placement EXAM: PORTABLE CHEST 1 VIEW COMPARISON:  08/19/2016 FINDINGS: Left chest wall pacer device is noted with lead in the right atrial appendage and right ventricle. Normal heart size. Aortic atherosclerosis noted. The right arm PICC line tip is at the cavoatrial junction. Patchy infiltrate within the right base is unchanged from previous exam. Left lung is clear. IMPRESSION: 1. Right arm PICC line tip is at the cavoatrial junction. 2. Persistent right base infiltrate. Electronically Signed   By: Signa Kell M.D.   On: 08/19/2016 12:02   Dg Abd 2 Views  Result Date:  08/18/2016 CLINICAL DATA:  Nausea and vomiting. History of gastritis and hypertension. EXAM: ABDOMEN - 2 VIEW COMPARISON:  05/04/2016 FINDINGS: Two supine views. The more cephalad imaged excludes the far left-sided abdomen. Nasogastric tube has been removed. Moderate-to-marked gaseous distension of the stomach. No gross free intraperitoneal air. No bowel distension. Osteopenia. Right hip osteoarthritis. IMPRESSION: Gaseous distension of the stomach, nonspecific.  CT pending. Electronically Signed   By: Jeronimo Greaves M.D.   On: 08/18/2016 16:57    Critical care time 45 minutes   Briony Parveen M.D on 08/20/2016 at 10:55 AM  Between 7am to 7pm - Pager - [phone]  After 7pm go to [email or web address] - password [facility]  [facility] -  Office  [phone]

## 2016-08-20 NOTE — Progress Notes (Signed)
Subjective: Laying in bed with eyes closed. Easily arousable but fatigued. Whispering his throat is sore with NG tube. No abdominal pain. NG tube with dark brown/green output. 400 ml from NG tube overnight, 2400 yesterday. Remains on levophed.   Objective: Vital signs in last 24 hours: Temp:  [97.4 F (36.3 C)-98.1 F (36.7 C)] 97.6 F (36.4 C) (03/06 0749) Pulse Rate:  [64-82] 73 (03/06 0630) Resp:  [10-25] 18 (03/06 0749) BP: (83-123)/(56-101) 92/67 (03/06 0749) SpO2:  [93 %-100 %] 100 % (03/06 0749)   General:   Resting with eyes closed, appears uncomfortable, chronically ill  Head:  Normocephalic and atraumatic. Abdomen:  Bowel sounds present, soft, non-tender, non-distended. Ostomy intact, no output noted in bag  Msk:  Thin upper and lower extremities  Extremities:  Without edema. Neurologic:  Oriented to person, place, situation  Skin:  Dry, scaly   Intake/Output from previous day: 03/05 0701 - 03/06 0700 In: 3226.6 [I.V.:2666.6; IV Piggyback:550] Out: 3200 [Urine:400; Emesis/NG output:2800] Intake/Output this shift: No intake/output data recorded.  Lab Results:  Recent Labs  08/19/16 0317 08/19/16 1751 08/20/16 0415  WBC 31.2* 34.4* 31.7*  HGB 13.4 11.1* 10.7*  HCT 39.3 32.4* 31.3*  PLT 350 209 226   BMET  Recent Labs  08/18/16 1521 08/19/16 0317 08/20/16 0415  NA 136 139 135  K 4.9 4.2 3.0*  CL 88* 93* 96*  CO2 36* 32 28  GLUCOSE 201* 173* 117*  BUN 31* 32* 40*  CREATININE 1.34* 1.41* 1.23  CALCIUM 9.6 9.3 8.2*   LFT  Recent Labs  08/18/16 1521 08/19/16 0317  PROT 6.9 6.5  ALBUMIN 3.7 3.5  AST 27 25  ALT 11* 12*  ALKPHOS 74 61  BILITOT 0.9 0.8   PT/INR  Recent Labs  08/18/16 1521 08/19/16 0317  LABPROT 33.0* 31.8*  INR 3.14 3.00     Studies/Results: Ct Abdomen Pelvis Wo Contrast  Result Date: 08/18/2016 CLINICAL DATA:  Weakness, vomiting, and hypotension. EXAM: CT ABDOMEN AND PELVIS WITHOUT CONTRAST TECHNIQUE:  Multidetector CT imaging of the abdomen and pelvis was performed following the standard protocol without IV contrast. COMPARISON:  CT scans since June 15, 2014 FINDINGS: Lower chest: The small left pleural effusion is decreased in size in the interval. The right-sided pleural effusion has resolved. There is fluid in the distal esophagus. Coronary artery calcifications are noted. Mild dependent atelectasis. No acute abnormalities in the lung bases. Hepatobiliary: Cholelithiasis is identified. The liver is unchanged. Pancreas: Unremarkable. No pancreatic ductal dilatation or surrounding inflammatory changes. Spleen: Normal in size without focal abnormality. Adrenals/Urinary Tract: Multiple simple and hyperdense renal cysts are seen, unchanged. The largest is on the right measuring at least 11 cm in transverse dimension. Vascular calcifications are associated with the kidneys. There are also renal stones. The stone in the right renal pelvis is larger in the interval measuring 17 by 9 mm. There is no resulting hydronephrosis but there is mild increased attenuation of fat adjacent to the right renal pelvis. Remainder of the right ureter is normal in caliber. No left-sided stones. The left ureter is normal. There is a Foley catheter in the bladder. Multiple stones are seen primarily dependently in the bladder. There is mild stranding adjacent to the bladder which could be seen with cystitis. This finding is similar in the interval however. Stomach/Bowel: The stomach is markedly distended. This finding is worsened in the interval. 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 The duodenum itself  is normal in caliber. The remainder of the small bowel is normal in caliber as well. The patient is status post partial colectomy. There is a left lower quadrant ostomy. The remainder of the colon is unremarkable. Scattered diverticuli without diverticulitis. No evidence of appendicitis. Vascular/Lymphatic:  Atherosclerotic change is seen in the non aneurysmal aorta. No adenopathy. Reproductive: Prostate is unremarkable. Other: Mild increased attenuation in the fat of the pericolic gutters is stable as is the mild increased attenuation anterior to the sacrum. Musculoskeletal: No interval bony changes identified. IMPRESSION: 1. Markedly distended stomach, worsened in the interval. This could be seen with gastric outlet obstruction or severe gastroparesis. 2. 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 3. Cholelithiasis. 4. Multiple renal cysts. The stone in the right renal pelvis has increased in the interval and there is now mild increased attenuation of fat adjacent to the right renal pelvis which could be due to intermittent obstruction. No hydronephrosis on today's study. 5. Multiple stones in the bladder. Mild fat stranding adjacent to the bladder could be due to cystitis. Recommend correlation with urinalysis to assess for UTI. 6. Atherosclerosis. 7. Diverticulosis. Electronically Signed   By: Gerome Sam III M.D   On: 08/18/2016 17:58   Dg Chest Port 1 View  Result Date: 08/19/2016 CLINICAL DATA:  Hypoxia. EXAM: PORTABLE CHEST 1 VIEW COMPARISON:  08/18/2016 and 05/10/2016 FINDINGS: NG tube tip is in the left upper quadrant the abdomen. Pacemaker in place. There is a patchy infiltrate at the right lung base. Left lung is clear. Heart size and vascularity are normal. Calcification in the arch of the aorta. No acute bone abnormality. IMPRESSION: New patchy infiltrate at the right lung base. Aortic atherosclerosis. Electronically Signed   By: Francene Boyers M.D.   On: 08/19/2016 09:48   Dg Chest Port 1 View  Result Date: 08/18/2016 CLINICAL DATA:  Hypotension and vomiting today.  05/10/2016 EXAM: PORTABLE CHEST 1 VIEW COMPARISON:  None. FINDINGS: Left-sided transvenous pacemaker leads to the right atrium and right ventricle. Heart is  mildly enlarged. Aorta is tortuous and partially calcified. Lungs are clear. No pulmonary edema. No free intraperitoneal air beneath the diaphragm. IMPRESSION: Stable cardiomegaly.  No evidence for acute pulmonary abnormality. Electronically Signed   By: Norva Pavlov M.D.   On: 08/18/2016 15:30   Dg Chest Port 1v Same Day  Result Date: 08/19/2016 CLINICAL DATA:  PICC line placement EXAM: PORTABLE CHEST 1 VIEW COMPARISON:  08/19/2016 FINDINGS: Left chest wall pacer device is noted with lead in the right atrial appendage and right ventricle. Normal heart size. Aortic atherosclerosis noted. The right arm PICC line tip is at the cavoatrial junction. Patchy infiltrate within the right base is unchanged from previous exam. Left lung is clear. IMPRESSION: 1. Right arm PICC line tip is at the cavoatrial junction. 2. Persistent right base infiltrate. Electronically Signed   By: Signa Kell M.D.   On: 08/19/2016 12:02   Dg Abd 2 Views  Result Date: 08/18/2016 CLINICAL DATA:  Nausea and vomiting. History of gastritis and hypertension. EXAM: ABDOMEN - 2 VIEW COMPARISON:  05/04/2016 FINDINGS: Two supine views. The more cephalad imaged excludes the far left-sided abdomen. Nasogastric tube has been removed. Moderate-to-marked gaseous distension of the stomach. No gross free intraperitoneal air. No bowel distension. Osteopenia. Right hip osteoarthritis. IMPRESSION: Gaseous distension of the stomach, nonspecific.  CT pending. Electronically Signed   By: Hosie Spangle.D.  On: 08/18/2016 16:57    Assessment: 83 year old male admitted with multiple medical issues, with GI consulted due to coffee-ground emesis. Likely secondary to esophagitis. 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 Not a candidate for EGD/conscious sedation due to critical illness; however, if he improves and stabilizes could consider EGD with pyloric channel dilation. Palliative  care consulted. Sepsis secondary to UTI/PNA. No ostomy output documented. 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 However, he is at risk due to multiple health deficits. Flagyl IV started by attending. Overall prognosis remains poor. Will follow peripherally but could consider EGD if clinically improves to undergo sedation/EGD.   Plan: PPI IV BID Palliative care consulted yesterday Will follow peripherally as he continues his stay   Gelene MinkAnna W. Boone, ANP-BC [facility]    LOS: 1 day    08/20/2016, 8:54 AM

## 2016-08-20 NOTE — Progress Notes (Signed)
Per Dr. Randol KernElgergawy I have been titrating patient O2 today. At this point I have gotten him down to 2L and he is tolerating this well with his O2 saturations reading 99%.

## 2016-08-20 NOTE — Consult Note (Signed)
Consultation Note Date: 08/20/2016   Patient Name: Jon [name]  DOB: 1932-05-19  MRN: [id number]  Age / Sex: 83 y.o., male  PCP: Jon MorganSteve Knowlton, MD Referring Physician: Starleen Armsawood S Elgergawy, MD  Reason for Consultation: Establishing goals of care and Psychosocial/spiritual support  HPI/Patient Profile: 83 y.o. male  with past medical history of Bilateral hydronephrosis, bladder calculi, gastritis, bleeding ulcer, essential hypertension, multiple rib fractures, neuropathy, obstructive sleep apnea does not use CPAP, PAF, Tachy Brady syndrome, varicose veins, history of stage V decubitus with subsequent ostomy and permanent indwelling Foley catheter, basically bedbound at the stage admitted on 08/18/2016 with upper G.I. bleed, a fib.   Clinical Assessment and Goals of Care: Mr. Jon [name] is resting quietly in bed. He aknowledges me when I touch his hand.  He is unable to open his eyes when I ask him to look at me. He states "my eyes won't open".  He denies questions or concerns at this time, and agrees for me to talk with his son Jon [name], who is at bedside at this time.  Jon [name] states that his church has been a big support through his father's illness. They live in "the family home". Jon [name] shares that his brother died in May, his mother had tube feeding for a swallowing problem for 12 to 15 years, and she also died within the last 2 years (July 3). Jon [name] goes on to share that his mother's sister, who lived next door to them his whole life, also has died within the last couple of years.  Jon [name] tells a story of decline with his father. He states that his wound suddenly developed in December 2015. They were unsure of the cause, but [name] needed a colostomy and indwelling permanent Foley cath for wound cleanliness.   Jon [name] shares that his father returned home September 2016.  [facility] has [facility] since  then.  Jon [name] and home health RN provide ostomy care. Jon [name] states his father is unable to provide for his own bathing or dressing, and spends most of his day in the bed (they have a hospital bed).  They were working on getting a Nurse, adulthoyer lift.  We talk about [name] current health concerns. I share my worry over increased white blood cells, but reassure Jon [name] that we're doing everything we can for his father. We talk about his albumin, which is normal at this time. I share a diagram of the chronic illness pathway, and we discuss normal changes in functional status as people become ill and age. We talk about code status. Jon [name] states that his father had elected DNR during his last hospitalization, but changed to this when he returned to his own home. I share with Jon [name] the realities of CPR, my worries about rib fracture and even further functional decline, and the medical teams recommendation to treat the treatable, but allow a natural death.  PMT will continue to follow, with planned meeting tomorrow.   Healthcare power of attorney NEXT OF KIN - son Jon [name] is  surrogate decision-maker. No other children or family.   SUMMARY OF RECOMMENDATIONS   continue to treat the treatable at this point, continue code status and end-of-life discussions.  Code Status/Advance Care Planning:  Full code - we discussed the realities of CPR and intubation, I share my worry concerning rib fractures and quality of life. I share medical team recommendation to "allow a natural death", but continue to treat the treatable.  Symptom Management:   Per hospitalist  Palliative Prophylaxis:   Palliative Wound Care and Turn Reposition  Additional Recommendations (Limitations, Scope, Preferences):  No limits at this time  Psycho-social/Spiritual:   Desire for further Chaplaincy support:no  Additional Recommendations: Caregiving  Support/Resources and Education on Hospice  Prognosis:   < 6 months, would not be  surprising based on 3 hospitalizations no last 6 months, functional decline, sepsis with white blood cells in the 30s, bedbound state.    Discharge Planning: Active with [facility] home[facility] at this time, will discuss hospice services during this hospitalization      Primary Diagnoses: Present on Admission: . Hypotension . UGI bleed . Septic shock (HCC)   I have reviewed the medical record, interviewed the patient and family, and examined the patient. The following aspects are pertinent.  Past Medical History:  Diagnosis Date  . Abnormality of gait   . Arthritis   . B12 deficiency   . Bilateral hydronephrosis   . Bladder calculi   . Bleeding ulcer   . Dysrhythmia   . Essential hypertension   . Essential tremor   . Gastritis   . Macular degeneration of left eye   . Memory loss   . Multiple rib fractures   . Neuropathy (HCC)   . OSA (obstructive sleep apnea)    Does not use CPAP  . PAF (paroxysmal atrial fibrillation) (HCC)   . Presence of permanent cardiac pacemaker   . Tachycardia-bradycardia syndrome (HCC)    [employer] PPM - Dr. Royann [name]  . Varicose veins    Bilateral   Social History   Social History  . Marital status: Married    Spouse name: N/A  . Number of children: 2  . Years of education: COLLEGE   Occupational History  . RETIRED    Social History Main Topics  . Smoking status: Former Smoker    Packs/day: 0.75    Years: 25.00    Types: Cigarettes  . Smokeless tobacco: Never Used     Comment: QUIT 35 YRS AGO  . Alcohol use No  . Drug use: No  . Sexual activity: No   Other Topics Concern  . None   Social History Narrative  . None   Family History  Problem Relation Age of Onset  . Aortic aneurysm Mother   . Parkinsonism Mother   . Heart disease Father   . Esophageal cancer Sister   . Colon cancer Neg Hx    Scheduled Meds: . Chlorhexidine Gluconate Cloth  6 each Topical Q0600  . metronidazole  500 mg Intravenous Q8H  . pantoprazole  (PROTONIX) IV  40 mg Intravenous Q12H  . piperacillin-tazobactam (ZOSYN)  IV  3.375 g Intravenous Q8H  . vancomycin  1,000 mg Intravenous Q24H   Continuous Infusions: . 0.9 % NaCl with KCl 40 mEq / L 100 mL/hr (08/20/16 0831)  . norepinephrine (LEVOPHED) Adult infusion 8 mcg/min (08/20/16 0635)   PRN Meds:.acetaminophen **OR** acetaminophen, promethazine Medications Prior to Admission:  Prior to Admission medications   Medication Sig Start Date End Date Taking? Authorizing Provider  ALPRAZolam (XANAX) 1 MG tablet Take 0.5 tablets (0.5 mg total) by mouth 2 (two) times daily as needed for anxiety. Patient taking differently: Take 0.5 mg by mouth 5 (five) times daily as needed for anxiety.  06/18/14  Yes Hollice Espy, MD  diphenhydrAMINE (BENADRYL) 25 MG tablet Take 25 mg by mouth every 6 (six) hours as needed for itching.   Yes Historical Provider, MD  HYDROcodone-acetaminophen (NORCO) 10-325 MG tablet Take 1 tablet by mouth every 6 (six) hours as needed for moderate pain.  05/04/15  Yes Historical Provider, MD  lisinopril (PRINIVIL,ZESTRIL) 10 MG tablet Take 10 mg by mouth daily.   Yes Historical Provider, MD  Multiple Vitamins-Minerals (ICAPS AREDS FORMULA PO) Take 1 tablet by mouth daily.   Yes Historical Provider, MD  nystatin (MYCOSTATIN/NYSTOP) powder Apply 1 application topically daily. 02/20/16  Yes Historical Provider, MD  pantoprazole (PROTONIX) 40 MG tablet Take 1 tablet (40 mg total) by mouth 2 (two) times daily before a meal. 05/18/15  Yes Erick Blinks, MD  potassium chloride SA (K-DUR,KLOR-CON) 20 MEQ tablet Take 1 tablet by mouth 2 (two) times daily. 05/08/16  Yes Historical Provider, MD  sotalol (BETAPACE) 160 MG tablet Take 1 tablet (160 mg total) by mouth 2 (two) times daily. Need appointment for future refills 06/06/15  Yes Mihai Croitoru, MD  Tetrahydrozoline HCl (EYE DROPS OP) Apply 2 drops to eye daily as needed (irritation).   Yes Historical Provider, MD  warfarin  (COUMADIN) 2.5 MG tablet Take 2.5-5 mg by mouth daily. Take two tablets on Tuesdays and Thursdays and one on all other days   Yes Historical Provider, MD   Allergies  Allergen Reactions  . Iohexol      Desc: PT STATES THROAT/FACE SWELLING W/ IVP DYE IN 1990. PT HAS NOT HAD ANY EXAMS DONE W/DYE SINCE AND HAS NEVER BEEN PRE MEDICATED. PER PT, Onset Date: [id number]   . Shellfish Allergy   . Dilantin [Phenytoin Sodium Extended] Rash  . Tegretol [Carbamazepine] Rash   Review of Systems  Unable to perform ROS: Acuity of condition    Physical Exam  Constitutional: No distress.  Appears frail and then, chronically ill. Able to make needs known, communicate effectively  HENT:  Head: Normocephalic and atraumatic.  Cardiovascular: Normal rate and regular rhythm.   Pulmonary/Chest: Effort normal. No respiratory distress.  Abdominal: Soft. He exhibits no distension.  Neurological: He is alert.  Unable to open eyes when I asked, not asked orientation today  Skin: Skin is warm and dry.  Nursing note and vitals reviewed.   Vital Signs: BP 98/65   Pulse 73   Temp 98.7 F (37.1 C) (Oral)   Resp 16   Ht 5\' 10"  (1.778 m)   Wt 56.3 kg (124 lb 1.9 oz)   SpO2 100%   BMI 17.81 kg/m  Pain Assessment: No/denies pain   Pain Score: 5    SpO2: SpO2: 100 % O2 Device:SpO2: 100 % O2 Flow Rate: .O2 Flow Rate (L/min): 9 L/min  IO: Intake/output summary:  Intake/Output Summary (Last 24 hours) at 08/20/16 1251 Last data filed at 08/20/16 1000  Gross per 24 hour  Intake          3175.74 ml  Output             1450 ml  Net          1725.74 ml    LBM:   Baseline Weight: Weight: 61.7 kg (136 lb) Most recent weight: Weight: 56.3 kg (  124 lb 1.9 oz)     Palliative Assessment/Data:   Flowsheet Rows   Flowsheet Row Most Recent Value  Intake Tab  Referral Department  -- [GI]  Unit at Time of Referral  ICU  Palliative Care Primary Diagnosis  Sepsis/Infectious Disease  Date Notified  08/19/16    Palliative Care Type  New Palliative care  Reason for referral  Clarify Goals of Care  Date of Admission  08/18/16  Date first seen by Palliative Care  08/20/16  # of days Palliative referral response time  1 Day(s)  # of days IP prior to Palliative referral  1  Clinical Assessment  Palliative Performance Scale Score  20%  Pain Max last 24 hours  Not able to report  Pain Min Last 24 hours  Not able to report  Dyspnea Max Last 24 Hours  Not able to report  Dyspnea Min Last 24 hours  Not able to report  Psychosocial & Spiritual Assessment  Palliative Care Outcomes  Patient/Family meeting held?  Yes  Who was at the meeting?  patient and son Jon Cooler  Palliative Care Outcomes  Provided advance care planning, Provided psychosocial or spiritual support, Clarified goals of care  Palliative Care follow-up planned  -- [follow up while at [facility]]      Time In: 1130 Time Out: 1220 Time Total: 50 minutes Greater than 50%  of this time was spent counseling and coordinating care related to the above assessment and plan.  Signed by: Katheran Awe, NP   Please contact Palliative Medicine Team phone at [phone] for questions and concerns.  For individual provider: See Loretha Stapler

## 2016-08-21 DIAGNOSIS — R112 Nausea with vomiting, unspecified: Secondary | ICD-10-CM

## 2016-08-21 DIAGNOSIS — K920 Hematemesis: Secondary | ICD-10-CM

## 2016-08-21 LAB — CBC
HEMATOCRIT: 28.5 % — AB (ref 39.0–52.0)
HEMOGLOBIN: 9.6 g/dL — AB (ref 13.0–17.0)
MCH: 28.7 pg (ref 26.0–34.0)
MCHC: 33.7 g/dL (ref 30.0–36.0)
MCV: 85.3 fL (ref 78.0–100.0)
Platelets: 150 10*3/uL (ref 150–400)
RBC: 3.34 MIL/uL — AB (ref 4.22–5.81)
RDW: 16.2 % — AB (ref 11.5–15.5)
WBC: 18.5 10*3/uL — AB (ref 4.0–10.5)

## 2016-08-21 LAB — BASIC METABOLIC PANEL
ANION GAP: 6 (ref 5–15)
BUN: 26 mg/dL — ABNORMAL HIGH (ref 6–20)
CO2: 27 mmol/L (ref 22–32)
Calcium: 8 mg/dL — ABNORMAL LOW (ref 8.9–10.3)
Chloride: 104 mmol/L (ref 101–111)
Creatinine, Ser: 0.86 mg/dL (ref 0.61–1.24)
GFR calc non Af Amer: >60 mL/min (ref >60)
Glucose, Bld: 92 mg/dL (ref 65–99)
POTASSIUM: 3.7 mmol/L (ref 3.5–5.1)
Sodium: 137 mmol/L (ref 135–145)

## 2016-08-21 LAB — URINE CULTURE

## 2016-08-21 LAB — GLUCOSE, CAPILLARY
GLUCOSE-CAPILLARY: 98 mg/dL (ref 65–99)
Glucose-Capillary: 72 mg/dL (ref 65–99)
Glucose-Capillary: 108 mg/dL — ABNORMAL HIGH (ref 65–99)

## 2016-08-21 LAB — PROTIME-INR
INR: 1.46
Prothrombin Time: 17.9 seconds — ABNORMAL HIGH (ref 11.4–15.2)

## 2016-08-21 LAB — MAGNESIUM: Magnesium: 1.5 mg/dL — ABNORMAL LOW (ref 1.7–2.4)

## 2016-08-21 LAB — PHOSPHORUS: PHOSPHORUS: 2.3 mg/dL — AB (ref 2.5–4.6)

## 2016-08-21 MED ORDER — NOREPINEPHRINE BITARTRATE 1 MG/ML IV SOLN
INTRAVENOUS | Status: AC
  Filled 2016-08-21: qty 4

## 2016-08-21 MED ORDER — MUPIROCIN 2 % EX OINT
1.0000 "application " | TOPICAL_OINTMENT | Freq: Two times a day (BID) | CUTANEOUS | Status: DC
  Administered 2016-08-21 – 2016-08-24 (×8): 1 via NASAL
  Filled 2016-08-21: qty 22

## 2016-08-21 NOTE — Evaluation (Signed)
Clinical/Bedside Swallow Evaluation Patient Details  Name: Jon CutterWilliam P [name] MRN: [id number] Date of Birth: [birth year]  Today's Date: 08/21/2016 Time: SLP Start Time (ACUTE ONLY): 1320 SLP Stop Time (ACUTE ONLY): 1400 SLP Time Calculation (min) (ACUTE ONLY): 40 min  Past Medical History:  Past Medical History:  Diagnosis Date  . Abnormality of gait   . Arthritis   . B12 deficiency   . Bilateral hydronephrosis   . Bladder calculi   . Bleeding ulcer   . Dysrhythmia   . Essential hypertension   . Essential tremor   . Gastritis   . Macular degeneration of left eye   . Memory loss   . Multiple rib fractures   . Neuropathy (HCC)   . OSA (obstructive sleep apnea)    Does not use CPAP  . PAF (paroxysmal atrial fibrillation) (HCC)   . Presence of permanent cardiac pacemaker   . Tachycardia-bradycardia syndrome (HCC)    [employer] PPM - Dr. Royann Shiversroitoru  . Varicose veins    Bilateral   Past Surgical History:  Past Surgical History:  Procedure Laterality Date  . BRAIN SURGERY    . CATARACT EXTRACTION Bilateral 2013  . COLON SURGERY    . COLOSTOMY N/A 07/11/2014   Procedure: DIVERTING COLOSTOMY;  Surgeon: Dalia HeadingMark A Jenkins, MD;  Location: [facility];  Service: General;  Laterality: N/A;  wound class: clean contaminated  . ESOPHAGOGASTRODUODENOSCOPY N/A 05/16/2015   Dr. Darrick PennaFields: few linear erosions/ulcerastions in distal esophagus, non-erosive gastritis, no duodenal abnormalities. Path with chronic gastritis, negative H.pylori.   . INCISION AND DRAINAGE OF WOUND N/A 07/11/2014   Procedure: DEBRIDEMENT OF SACRUM;  Surgeon: Dalia HeadingMark A Jenkins, MD;  Location: [facility];  Service: General;  Laterality: N/A;  . INSERT / REPLACE / REMOVE PACEMAKER    . MENINGIOMA REMOVAL  1992   POST RT FRONTAL   . PACEMAKER INSERTION  2006  . PERMANENT PACEMAKER GENERATOR CHANGE N/A 08/13/2012   Procedure: PERMANENT PACEMAKER GENERATOR CHANGE;  Surgeon: Thurmon FairMihai Croitoru, MD;  Location: [facility];  Service: Cardiovascular;   Laterality: N/A;   HPI:  83 y.o. male, with a past medical history significant for PAF On warfarin , tachybradycardia syndrome status post pacemaker, chronic diastolic CHF, neuropathy, OSA, HTN, anxiety, s/p diverting colostomy & indwelling Foley due to Sacral Ulcer, and ambulatory at bedside, bedbound, patient was brought to the hospital due to several episodes of coffee-ground emesis, patient with known colostomy has not been draining much for few days, CT abdomen pelvis with markedly distended stomach possible gastric outlet obstruction versus severe gastroparesis, as well multiple stones in the urinary bladder possible cystitis, patient was found to have septic shock, required to ICU and started on pressors. Pt had NG placed (now removed for suction). BSE ordered. Pt with RLL PNA likely from emesis and possible aspiration prior to admission.    Assessment / Plan / Recommendation Clinical Impression  Pt seen at bedside for clinical swallow evaluation. Pt received reclined and leaning to the right in bed (note chest x-ray with RLL infiltrate). After repositioning, oral motor examination completed and revealed bilateral labial and lingual weakness likely due to deconditioning. Pt with poor dentition with evidence of decay, however Pt denies discomfort. He tells me that his throat is sore "from the tube". Pt assessed with ice chips, thin water via cup and straw sips, NTL, HTL, puree, mech soft, and regular textures. Pt with seemingly prompt swallow. Occasional delayed cough after thin liquids (improved performance with SLP controlled single, cup sips and  worse with Pt controlled straw and sequential cup sips). Pt with one overt strong coughing episode after consuming dry cracker and following with thin water. SLP provided moderate verbal cues to take single sips and swallow 2x for each presentation. Pt noted to consume NTL via cup sips without incident. Will initiate conservative diet of D1/puree and NTL  (nectar-thick) via cup sips with 100% supervision for all eating/drinking. SLP will follow during acute stay for diet tolerance, need for objective assessment, upgrades as appropriate. Above to Pt, family, and RN Nadine Counts). OK for po meds whole with NTL or in puree.   SLP Visit Diagnosis: Dysphagia, unspecified (R13.10)    Aspiration Risk  Moderate aspiration risk    Diet Recommendation Dysphagia 1 (Puree);Nectar-thick liquid;Ice chips PRN after oral care   Liquid Administration via: Cup;No straw Medication Administration: Whole meds with liquid Supervision: Patient able to self feed;Full supervision/cueing for compensatory strategies Compensations: Slow rate;Small sips/bites;Multiple dry swallows after each bite/sip;Clear throat intermittently;Effortful swallow Postural Changes: Seated upright at 90 degrees;Remain upright for at least 30 minutes after po intake    Other  Recommendations Oral Care Recommendations: Oral care BID;Oral care before and after PO;Staff/trained caregiver to provide oral care Other Recommendations: Prohibited food (jello, ice cream, thin soups);Remove water pitcher;Clarify dietary restrictions   Follow up Recommendations Skilled Nursing facility      Frequency and Duration min 2x/week  1 week       Prognosis Prognosis for Safe Diet Advancement: Fair      Swallow Study   General Date of Onset: 08/18/16 HPI: 83 y.o. male, with a past medical history significant for PAF On warfarin , tachybradycardia syndrome status post pacemaker, chronic diastolic CHF, neuropathy, OSA, HTN, anxiety, s/p diverting colostomy & indwelling Foley due to Sacral Ulcer, and ambulatory at bedside, bedbound, patient was brought to the hospital due to several episodes of coffee-ground emesis, patient with known colostomy has not been draining much for few days, CT abdomen pelvis with markedly distended stomach possible gastric outlet obstruction versus severe gastroparesis, as well multiple  stones in the urinary bladder possible cystitis, patient was found to have septic shock, required to ICU and started on pressors. Pt had NG placed (now removed for suction). BSE ordered. Pt with RLL PNA likely from emesis and possible aspiration prior to admission.  Type of Study: Bedside Swallow Evaluation Previous Swallow Assessment: None on record Diet Prior to this Study: NPO Temperature Spikes Noted: No Respiratory Status: Nasal cannula History of Recent Intubation: No Behavior/Cognition: Alert;Cooperative;Pleasant mood Oral Cavity Assessment: Within Functional Limits (lips are cracked) Oral Care Completed by SLP: Yes Oral Cavity - Dentition: Poor condition (evidence of decay) Vision: Functional for self-feeding Self-Feeding Abilities: Able to feed self;Needs set up Patient Positioning: Upright in bed (Pt with right lean preference) Baseline Vocal Quality: Hoarse Volitional Cough: Strong Volitional Swallow: Able to elicit    Oral/Motor/Sensory Function Overall Oral Motor/Sensory Function: Moderate impairment Facial ROM: Within Functional Limits Facial Symmetry: Within Functional Limits Facial Strength: Reduced right;Reduced left Facial Sensation: Within Functional Limits Lingual ROM: Within Functional Limits Lingual Symmetry: Within Functional Limits Lingual Strength: Reduced Lingual Sensation: Within Functional Limits Velum: Within Functional Limits Mandible: Within Functional Limits   Ice Chips Ice chips: Within functional limits Presentation: Spoon   Thin Liquid Thin Liquid: Impaired Presentation: Cup;Straw;Self Fed Pharyngeal  Phase Impairments: Wet Vocal Quality;Throat Clearing - Delayed;Cough - Delayed    Nectar Thick Nectar Thick Liquid: Within functional limits Presentation: Cup;Self Fed   Honey Thick Honey Thick Liquid:  Within functional limits Presentation: Spoon   Puree Puree: Within functional limits Presentation: Spoon   Solid   Thank you,  Havery Moros, CCC-SLP [phone]    Solid: Impaired Presentation: Spoon Oral Phase Functional Implications: Oral residue Pharyngeal Phase Impairments: Cough - Immediate;Cough - Delayed        PORTER,DABNEY 08/21/2016,2:23 PM

## 2016-08-21 NOTE — Progress Notes (Signed)
Pharmacy Antibiotic Note  Jon [name] is a 83 y.o. male admitted on 08/18/2016 with sepsis.  Pharmacy has been consulted for Vancomycin and Zosyn dosing.  Plan: Vancomycin 1000mg  IV every 24 hours.  Goal trough 15-20 mcg/mL. Zosyn 3.375g IV q8h (4 hour infusion).  F/U cxs and clinical progress Monitor V/S, labs, and levels as indicated  Height: 5\' 10"  (177.8 cm) Weight: 133 lb 2.5 oz (60.4 kg) IBW/kg (Calculated) : 73  Temp (24hrs), Avg:98.4 F (36.9 C), Min:98.2 F (36.8 C), Max:98.7 F (37.1 C)   Recent Labs Lab 08/18/16 1521 08/18/16 1537 08/18/16 2009 08/19/16 0317 08/19/16 1243 08/19/16 1517 08/19/16 1751 08/20/16 0415 08/21/16 0414  WBC 23.2*  --   --  31.2*  --   --  34.4* 31.7* 18.5*  CREATININE 1.34*  --   --  1.41*  --   --   --  1.23 0.86  LATICACIDVEN  --  3.45* 2.98*  --  3.1* 2.1*  --   --   --     Estimated Creatinine Clearance: 56.6 mL/min (by C-G formula based on SCr of 0.86 mg/dL).    Allergies  Allergen Reactions  . Iohexol      Desc: PT STATES THROAT/FACE SWELLING W/ IVP DYE IN 1990. PT HAS NOT HAD ANY EXAMS DONE W/DYE SINCE AND HAS NEVER BEEN PRE MEDICATED. PER PT, Onset Date: [id number]   . Shellfish Allergy   . Dilantin [Phenytoin Sodium Extended] Rash  . Tegretol [Carbamazepine] Rash   Antimicrobials this admission: Vancomycin 3/4 >>  Zosyn 3/4>>  Dose adjustments this admission: N/A  Microbiology results: 3/4 BCx: pending 3/4 UCx: pending 05/02/2016 MRSA PCR: positive  Thank you for allowing pharmacy to be a part of this patient's care.  Valrie HartScott Vennela Jutte, PharmD Clinical Pharmacist Pager:  [phone] 08/21/2016   08/21/2016 10:06 AM

## 2016-08-21 NOTE — Progress Notes (Signed)
Subjective:  Feels somewhat better. Glad the NG tube was removed. Wants to eat. Mouth dry.   Objective: Vital signs in last 24 hours: Temp:  [98.2 F (36.8 C)-98.7 F (37.1 C)] 98.5 F (36.9 C) (03/07 0717) Pulse Rate:  [73-95] 88 (03/07 0700) Resp:  [0-27] 16 (03/07 0700) BP: (88-120)/(57-89) 116/89 (03/07 0700) SpO2:  [95 %-100 %] 99 % (03/07 0700) Weight:  [133 lb 2.5 oz (60.4 kg)] 133 lb 2.5 oz (60.4 kg) (03/07 0500)   General:   Alert, in NAD Head:  Normocephalic and atraumatic. Eyes:  Sclera clear, no icterus.  Abdomen:  Soft, nontender and nondistended.   Normal bowel sounds, without guarding, and without rebound.  No output in ostomy bag.  Extremities:  Without clubbing, deformity or edema. Neurologic:  Alert and  oriented x4;  grossly normal neurologically. Psych:  Alert and cooperative. Normal mood and affect.  Intake/Output from previous day: 03/06 0701 - 03/07 0700 In: 2531.5 [I.V.:2181.5; IV Piggyback:350] Out: 1200 [Urine:1200] Intake/Output this shift: No intake/output data recorded.  Lab Results: CBC  Recent Labs  08/19/16 1751 08/20/16 0415 08/21/16 0414  WBC 34.4* 31.7* 18.5*  HGB 11.1* 10.7* 9.6*  HCT 32.4* 31.3* 28.5*  MCV 84.4 84.8 85.3  PLT 209 226 150   BMET  Recent Labs  08/19/16 0317 08/20/16 0415 08/21/16 0414  NA 139 135 137  K 4.2 3.0* 3.7  CL 93* 96* 104  CO2 32 28 27  GLUCOSE 173* 117* 92  BUN 32* 40* 26*  CREATININE 1.41* 1.23 0.86  CALCIUM 9.3 8.2* 8.0*   LFTs  Recent Labs  08/18/16 1521 08/19/16 0317  BILITOT 0.9 0.8  ALKPHOS 74 61  AST 27 25  ALT 11* 12*  PROT 6.9 6.5  ALBUMIN 3.7 3.5   No results for input(s): LIPASE in the last 72 hours. PT/INR  Recent Labs  08/18/16 1521 08/19/16 0317 08/21/16 0414  LABPROT 33.0* 31.8* 17.9*  INR 3.14 3.00 1.46      Imaging Studies: Ct Abdomen Pelvis Wo Contrast  Result Date: 08/18/2016 CLINICAL DATA:  Weakness, vomiting, and hypotension. EXAM: CT ABDOMEN AND  PELVIS WITHOUT CONTRAST TECHNIQUE: Multidetector CT imaging of the abdomen and pelvis was performed following the standard protocol without IV contrast. COMPARISON:  CT scans since June 15, 2014 FINDINGS: Lower chest: The small left pleural effusion is decreased in size in the interval. The right-sided pleural effusion has resolved. There is fluid in the distal esophagus. Coronary artery calcifications are noted. Mild dependent atelectasis. No acute abnormalities in the lung bases. Hepatobiliary: Cholelithiasis is identified. The liver is unchanged. Pancreas: Unremarkable. No pancreatic ductal dilatation or surrounding inflammatory changes. Spleen: Normal in size without focal abnormality. Adrenals/Urinary Tract: Multiple simple and hyperdense renal cysts are seen, unchanged. The largest is on the right measuring at least 11 cm in transverse dimension. Vascular calcifications are associated with the kidneys. There are also renal stones. The stone in the right renal pelvis is larger in the interval measuring 17 by 9 mm. There is no resulting hydronephrosis but there is mild increased attenuation of fat adjacent to the right renal pelvis. Remainder of the right ureter is normal in caliber. No left-sided stones. The left ureter is normal. There is a Foley catheter in the bladder. Multiple stones are seen primarily dependently in the bladder. There is mild stranding adjacent to the bladder which could be seen with cystitis. This finding is similar in the interval however. Stomach/Bowel: The stomach is markedly distended. This finding  is worsened in the interval. 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 The duodenum itself is normal in caliber. The remainder of the small bowel is normal in caliber as well. The patient is status post partial colectomy. There is a left lower quadrant ostomy. The remainder of the colon is unremarkable. Scattered diverticuli without diverticulitis. No evidence of  appendicitis. Vascular/Lymphatic: Atherosclerotic change is seen in the non aneurysmal aorta. No adenopathy. Reproductive: Prostate is unremarkable. Other: Mild increased attenuation in the fat of the pericolic gutters is stable as is the mild increased attenuation anterior to the sacrum. Musculoskeletal: No interval bony changes identified. IMPRESSION: 1. Markedly distended stomach, worsened in the interval. This could be seen with gastric outlet obstruction or severe gastroparesis. 2. 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 3. Cholelithiasis. 4. Multiple renal cysts. The stone in the right renal pelvis has increased in the interval and there is now mild increased attenuation of fat adjacent to the right renal pelvis which could be due to intermittent obstruction. No hydronephrosis on today's study. 5. Multiple stones in the bladder. Mild fat stranding adjacent to the bladder could be due to cystitis. Recommend correlation with urinalysis to assess for UTI. 6. Atherosclerosis. 7. Diverticulosis. Electronically Signed   By: Gerome Samavid  Williams III M.D   On: 08/18/2016 17:58   Dg Chest Port 1 View  Result Date: 08/19/2016 CLINICAL DATA:  Hypoxia. EXAM: PORTABLE CHEST 1 VIEW COMPARISON:  08/18/2016 and 05/10/2016 FINDINGS: NG tube tip is in the left upper quadrant the abdomen. Pacemaker in place. There is a patchy infiltrate at the right lung base. Left lung is clear. Heart size and vascularity are normal. Calcification in the arch of the aorta. No acute bone abnormality. IMPRESSION: New patchy infiltrate at the right lung base. Aortic atherosclerosis. Electronically Signed   By: Francene BoyersJames  Maxwell M.D.   On: 08/19/2016 09:48   Dg Chest Port 1 View  Result Date: 08/18/2016 CLINICAL DATA:  Hypotension and vomiting today.  05/10/2016 EXAM: PORTABLE CHEST 1 VIEW COMPARISON:  None. FINDINGS: Left-sided transvenous pacemaker leads to the right  atrium and right ventricle. Heart is mildly enlarged. Aorta is tortuous and partially calcified. Lungs are clear. No pulmonary edema. No free intraperitoneal air beneath the diaphragm. IMPRESSION: Stable cardiomegaly.  No evidence for acute pulmonary abnormality. Electronically Signed   By: Norva PavlovElizabeth  Brown M.D.   On: 08/18/2016 15:30   Dg Chest Port 1v Same Day  Result Date: 08/19/2016 CLINICAL DATA:  PICC line placement EXAM: PORTABLE CHEST 1 VIEW COMPARISON:  08/19/2016 FINDINGS: Left chest wall pacer device is noted with lead in the right atrial appendage and right ventricle. Normal heart size. Aortic atherosclerosis noted. The right arm PICC line tip is at the cavoatrial junction. Patchy infiltrate within the right base is unchanged from previous exam. Left lung is clear. IMPRESSION: 1. Right arm PICC line tip is at the cavoatrial junction. 2. Persistent right base infiltrate. Electronically Signed   By: Signa Kellaylor  Stroud M.D.   On: 08/19/2016 12:02   Dg Abd 2 Views  Result Date: 08/18/2016 CLINICAL DATA:  Nausea and vomiting. History of gastritis and hypertension. EXAM: ABDOMEN - 2 VIEW COMPARISON:  05/04/2016 FINDINGS: Two supine views. The more cephalad imaged excludes the far left-sided abdomen. Nasogastric tube has been removed. Moderate-to-marked gaseous distension of the stomach. No gross free intraperitoneal air. No bowel distension. Osteopenia. Right hip  osteoarthritis. IMPRESSION: Gaseous distension of the stomach, nonspecific.  CT pending. Electronically Signed   By: Jeronimo Greaves M.D.   On: 08/18/2016 16:57  [2 weeks]   Assessment:  83 year old male admitted with multiple medical issues, with GI consulted due to coffee-ground emesis. Likely secondary to esophagitis. 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 Not a candidate for EGD/conscious sedation due to critical illness; however, if he  improves and stabilizes could consider EGD with pyloric channel dilation. Palliative care consulted. Sepsis secondary to UTI/PNA. No ostomy output documented. 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 However, he is at risk due to multiple health deficits. Flagyl IV started by attending.   Consider EGD if clinically improves to undergo sedation/EGD to rule out partial gastric outlet obstruction due to duodenal stricture and to evaluate chronic ugi symptoms, weight loss.   More alert today. NG tube removed. No N/V. Speech therapy bedside evaluation pending.   Plan: 1. F/U speech eval. 2. Continue PPI.   Leanna Battles. Dixon Boos [facility] [phone] 3/7/20189:54 AM     LOS: 2 days

## 2016-08-21 NOTE — Progress Notes (Signed)
Daily Progress Note   Patient Name: Jon [name]       Date: 08/21/2016 DOB: May 11, 1932  Age: 83 y.o. MRN#: [id number] Attending Physician: Micael Hampshire Acost* Primary Care Physician: Milana Obey, MD Admit Date: 08/18/2016  Reason for Consultation/Follow-up: Establishing goals of care and Psychosocial/spiritual support  Subjective: Jon [name] is lying quietly in bed. He makes eye contact as I enter the room. He is able to follow commands today, making his needs known. Present at bedside today is son Tawanna Cooler. Tawanna Cooler states that he has been staying at the hospital except for brief moments to go home. We talk about Jon [name] proved condition, improved blood work. I share with Tawanna Cooler that I feel he is safe to go home and get some rest, provide care for himself. As were talking more family members come to visit.  No questions or concerns at this time. I share with Tawanna Cooler that I will return later in the day to continue our discussions. I returned later in the afternoon. Both Jon [name] and Tawanna Cooler are sleeping very soundly. They do not awaken as I rustle around the room putting on gowns and accessing the computer system. I will return tomorrow to discuss code status, and the benefits of hospice in detail.  Length of Stay: 2  Current Medications: Scheduled Meds:  . Chlorhexidine Gluconate Cloth  6 each Topical Q0600  . mupirocin ointment  1 application Nasal BID  . pantoprazole (PROTONIX) IV  40 mg Intravenous Q12H  . piperacillin-tazobactam (ZOSYN)  IV  3.375 g Intravenous Q8H  . vancomycin  1,000 mg Intravenous Q24H    Continuous Infusions: . 0.9 % NaCl with KCl 40 mEq / L 100 mL/hr at 08/21/16 1046  . norepinephrine (LEVOPHED) Adult infusion 2.5 mcg/min (08/21/16 1107)    PRN  Meds: acetaminophen **OR** acetaminophen, promethazine  Physical Exam  Constitutional: No distress.  Spontaneously opens eyes, makes and keeps eye contact, calm, cooperative.  HENT:  Head: Normocephalic and atraumatic.  Cardiovascular: Normal rate and regular rhythm.   Pulmonary/Chest: Effort normal. No respiratory distress.  Abdominal: Soft. He exhibits no distension.  concave abdomen, ostomy  Musculoskeletal:  Very thin and frail  Neurological: He is alert.  Skin: Skin is warm and dry.  Nursing note and vitals reviewed.  Vital Signs: BP 116/89   Pulse 88   Temp 97.8 F (36.6 C)   Resp 16   Ht 5\' 10"  (1.778 m)   Wt 60.4 kg (133 lb 2.5 oz)   SpO2 99%   BMI 19.11 kg/m  SpO2: SpO2: 99 % O2 Device: O2 Device: High Flow Nasal Cannula O2 Flow Rate: O2 Flow Rate (L/min): 2 L/min  Intake/output summary:  Intake/Output Summary (Last 24 hours) at 08/21/16 1409 Last data filed at 08/21/16 1237  Gross per 24 hour  Intake          3475.37 ml  Output              550 ml  Net          2925.37 ml   LBM:   Baseline Weight: Weight: 61.7 kg (136 lb) Most recent weight: Weight: 60.4 kg (133 lb 2.5 oz)       Palliative Assessment/Data:    Flowsheet Rows   Flowsheet Row Most Recent Value  Intake Tab  Referral Department  -- [GI]  Unit at Time of Referral  ICU  Palliative Care Primary Diagnosis  Sepsis/Infectious Disease  Date Notified  08/19/16  Palliative Care Type  New Palliative care  Reason for referral  Clarify Goals of Care  Date of Admission  08/18/16  Date first seen by Palliative Care  08/20/16  # of days Palliative referral response time  1 Day(s)  # of days IP prior to Palliative referral  1  Clinical Assessment  Palliative Performance Scale Score  20%  Pain Max last 24 hours  Not able to report  Pain Min Last 24 hours  Not able to report  Dyspnea Max Last 24 Hours  Not able to report  Dyspnea Min Last 24 hours  Not able to report  Psychosocial &  Spiritual Assessment  Palliative Care Outcomes  Patient/Family meeting held?  Yes  Who was at the meeting?  patient and son Tawanna Coolerodd  Palliative Care Outcomes  Provided advance care planning, Provided psychosocial or spiritual support, Clarified goals of care  Palliative Care follow-up planned  -- [follow up while at [facility]]      Patient Active Problem List   Diagnosis Date Noted  . N&V (nausea and vomiting)   . Goals of care, counseling/discussion   . DNR (do not resuscitate) discussion   . Palliative care encounter   . Pressure injury of skin 08/19/2016  . Septic shock (HCC) 08/19/2016  . Gastric distention   . Heme positive stool   . UGI bleed 08/18/2016  . Sacral decubitus ulcer, stage IV (HCC) 2020/07/2515  . Epistaxis 05/10/2016  . Diarrhea 05/02/2016  . Prolonged QT interval   . Paroxysmal atrial fibrillation (HCC)   . Tachycardia-bradycardia syndrome (HCC)   . Cardiac pacemaker in situ   . Hematemesis 05/15/2015  . Gastrointestinal hemorrhage 05/15/2015  . Malnutrition of moderate degree (HCC) 07/11/2014  . Perianal infection 07/06/2014  . Decubitus ulcer 07/06/2014  . Sacral ulcer (HCC) 07/06/2014  . Anemia of chronic disease 06/16/2014  . Sepsis (HCC) 06/15/2014  . Lower urinary tract infectious disease 06/15/2014  . Acute kidney injury (HCC) 06/15/2014  . Bilateral hydronephrosis 06/15/2014  . Bladder calculi 06/15/2014  . Rhabdomyolysis 06/15/2014  . Multiple rib fractures 06/15/2014  . Hypotension 06/15/2014  . S/P placement of cardiac pacemaker 06/15/2014  . Dementia 06/15/2014  . AKI (acute kidney injury) (HCC) 06/15/2014  . Knee pain   . Dehydration 06/09/2014  . Hypertension 06/09/2014  .  Abnormality of gait 05/03/2013  . Essential and other specified forms of tremor 05/03/2013  . Polyneuropathy in other diseases classified elsewhere (HCC) 05/03/2013  . Atrial fibrillation (HCC) 09/07/2012  . Long term current use of anticoagulant therapy 09/07/2012     Palliative Care Assessment & Plan   Patient Profile: 83 y.o. male  with past medical history of Bilateral hydronephrosis, bladder calculi, gastritis, bleeding ulcer, essential hypertension, multiple rib fractures, neuropathy, obstructive sleep apnea does not use CPAP, PAF, Tachy Brady syndrome, varicose veins, history of stage V decubitus with subsequent ostomy and permanent indwelling Foley catheter, basically bedbound at the stage admitted on 08/18/2016 with upper G.I. bleed, a fib.   Assessment: G.I. Bleed; hemoglobin stable in the 9's.  frailty, functional declines; current with advanced home health services. Hospital bed in the home, new wheelchair ramp built recently, per son Tawanna Cooler Jon [name] spends most of his day in bed.  Recommendations/Plan:  Continue to treat the treatable, continued code status and end-of-life discussions, at this point, continue to plan for return home with advanced home health service  Goals of Care and Additional Recommendations:  Limitations on Scope of Treatment: No limitations on scope of treatment at this time.  Code Status:    Code Status Orders        Start     Ordered   08/18/16 2057  Full code  Continuous     08/18/16 2056    Code Status History    Date Active Date Inactive Code Status Order ID Comments User Context   05/10/2016  3:56 PM 05/13/2016  6:28 PM Full Code [id number]  Henderson Cloud, MD ED   05/02/2016 12:48 PM 05/06/2016  5:29 PM DNR [id number]  Leroy Sea, MD Inpatient   05/15/2015  4:03 PM 05/18/2015  6:01 PM Full Code [id number]  Standley Brooking, MD ED   07/11/2014 11:59 AM 07/12/2014  7:32 PM Full Code [id number]  Dalia Heading, MD Inpatient   07/06/2014  9:30 PM 07/11/2014 11:59 AM Full Code [id number]  Doree Albee, MD ED   06/15/2014 10:41 AM 06/18/2014  6:56 PM Full Code [id number]  Standley Brooking, MD Inpatient   06/09/2014  4:50 PM 06/11/2014  9:19 PM Full Code [id number]  Wilson Singer, MD ED        Prognosis:   < 6 months < 6 months, would not be surprising based on 3 hospitalizations no last 6 months, functional decline, hospitalization with sepsis, bedbound state.   Discharge Planning:  To be determined, current patient with advanced home health services. PMT will discuss the benefits of hospice during next family meeting.  Care plan was discussed with nursing staff, case manager, social worker, and Dr. Viann Fish.  Thank you for allowing the Palliative Medicine Team to assist in the care of this patient.   Time In: 1025 Time Out: 1045 Total Time 20 minutes Prolonged Time Billed  no       Greater than 50%  of this time was spent counseling and coordinating care related to the above assessment and plan.  Katheran Awe, NP  Please contact Palliative Medicine Team phone at [phone] for questions and concerns.

## 2016-08-21 NOTE — Progress Notes (Signed)
PROGRESS NOTE                                                                                                                                                                                                             Patient Demographics:    Jon [name], is a 83 y.o. male, DOB - [birth year], ZOX:[id number]  Admit date - 08/18/2016   Admitting Physician Meredeth Ide, MD  Outpatient Primary MD for the patient is Milana Obey, MD   Outpatient Specialists: Cardiology Dr. Loney Hering, GI Dr Darrick Penna.  Chief Complaint  Patient presents with  . Emesis       Brief Narrative   83 y.o. male, with a past medical history significant for PAF On warfarin , tachybradycardia syndrome status post pacemaker, chronic diastolic CHF, neuropathy, OSA, HTN, anxiety, s/p diverting colostomy & indwelling Foley due to Sacral Ulcer, and ambulatory at bedside, bedbound, patient was brought to the hospital due to several episodes of coffee-ground emesis, patient with known colostomy has not been draining much for few days, CT abdomen pelvis with markedly distended stomach possible gastric outlet obstruction versus severe gastroparesis, as well multiple stones in the urinary bladder possible cystitis, patient was found to have septic shock, required to ICU and started on pressors.   Subjective:    [name] today has, No headache, No chest pain, No abdominal pain or Shortness of breath, report generalized weakness and fatigue.   Assessment  & Plan :    Active Problems:   Hypotension   UGI bleed   Pressure injury of skin   Gastric distention   Heme positive stool   Septic shock (HCC)   Goals of care, counseling/discussion   DNR (do not resuscitate) discussion   Palliative care encounter   N&V (nausea and vomiting)   Septic shock - Hypotension improved, will try and wean off levophed today. - Follow on blood cultures, so far no growth on day 3. - Chest x-ray  with evidence of right lower lobe pneumonia, most likely aspiration pneumonia, already on vac and Zosyn. Continue Vanc as MRSA PCR is positive. - Sacral  pressure ulcer appears to be healing nicely with no evidence of infection - Sepsis-most likely related to urinary source and aspiration PNA, follow on urine cultures, foley catheter has been changed. -significant improvement overnight in leukocytosis.  Gastric outlet  obstruction - NGT removed. No N/V. -Appreciate GI input: to consider EGD once no longer on pressors. Patient most likely has gastroparesis. EGD in 11/16 without pyloric stenosis. -Start diet pending ST recommendations.  Acute hypoxic respiratory failure -Likely due to aspiration PNA, -Improved, continue to wean oxygen as tolerated.  Aspiration pneumonia - Continue with vancomycin and Zosyn  Chronic diastolic CHF - Recent echo on 40/9811 with EF 65%, grade 2 diastolic dysfunction. -No signs of volume overload at present.  Atrial fibrillation/tachybradycardia syndrome - Patient with known history of A. Fib, on warfarin supratherapeutic INR presentation of 3.14. - Continue to hold warfarin for presumed GI bleed, continue to hold sotalol for hypertension.  History of hypertension - Lisinopril and sotalol given septic shock  Coagulopathy - Both therapeutic INR on admission 3.1 ,received  vitamin K and FFP, INR 1.46 on 3/7.  Hypokalemia - Replaced.  Protein calorie malnutrition -Start diet pending ST recs.  Pressure ulcer - Appears to be healing nicely, almost closed no discharge or evidence of infection on physical exam  Hyperglycemia - Check hemoglobin A1c, CBG every 6 time X4  Code Status : Full  Family Communication  : Son at bedside  Disposition Plan  : remains in ICU, try to wean pressors today.  Consults  :  GI  Procedures  : None  DVT Prophylaxis  :   SCDs   Lab Results  Component Value Date   PLT 150 08/21/2016    Antibiotics  :     Anti-infectives    Start     Dose/Rate Route Frequency Ordered Stop   08/20/16 1100  metroNIDAZOLE (FLAGYL) IVPB 500 mg  Status:  Discontinued     500 mg 100 mL/hr over 60 Minutes Intravenous Every 8 hours 08/20/16 1052 08/21/16 1159   08/20/16 0745  vancomycin (VANCOCIN) 500 mg in sodium chloride irrigation 0.9 % 100 mL ENEMA  Status:  Discontinued     500 mg Rectal Every 6 hours 08/20/16 0739 08/20/16 0739   08/19/16 0500  vancomycin (VANCOCIN) IVPB 1000 mg/200 mL premix     1,000 mg 200 mL/hr over 60 Minutes Intravenous Every 24 hours 08/18/16 1845     08/18/16 2300  piperacillin-tazobactam (ZOSYN) IVPB 3.375 g     3.375 g 12.5 mL/hr over 240 Minutes Intravenous Every 8 hours 08/18/16 1845     08/18/16 1700  piperacillin-tazobactam (ZOSYN) IVPB 3.375 g     3.375 g 100 mL/hr over 30 Minutes Intravenous  Once 08/18/16 1652 08/18/16 1729   08/18/16 1700  vancomycin (VANCOCIN) IVPB 1000 mg/200 mL premix     1,000 mg 200 mL/hr over 60 Minutes Intravenous  Once 08/18/16 1652 08/18/16 1833        Objective:   Vitals:   08/21/16 0645 08/21/16 0700 08/21/16 0717 08/21/16 1113  BP: 104/74 116/89    Pulse: 78 88    Resp: 15 16    Temp:   98.5 F (36.9 C) 97.8 F (36.6 C)  TempSrc:   Oral   SpO2: 100% 99%    Weight:      Height:        Wt Readings from Last 3 Encounters:  08/21/16 60.4 kg (133 lb 2.5 oz)  05/11/16 62 kg (136 lb 11 oz)  05/05/16 68.2 kg (150 lb 5.7 oz)     Intake/Output Summary (Last 24 hours) at 08/21/16 1516 Last data filed at 08/21/16 1457  Gross per 24 hour  Intake  3475.37 ml  Output             1100 ml  Net          2375.37 ml     Physical Exam  Awake Alert, Oriented X 3, Frail, chronically ill-appearing Disheveled, long nails Supple Neck,No JVD, .  Symmetrical Chest wall movement, diminished air movement bilaterally, no wheezing Irreg,No Gallops,Rubs or new Murmurs, No Parasternal Heave +ve B.Sounds, Abd Soft, No tenderness, No  rebound - guarding or rigidity, has colostomy bag which is empty Long nails, poor skin condition, hyperkeratosis, saccular pressure ulcer which is unstageable as it is healing and closed, no evidence of discharge or infection    Data Review:    CBC  Recent Labs Lab 08/18/16 1521 08/19/16 0317 08/19/16 1751 08/20/16 0415 08/21/16 0414  WBC 23.2* 31.2* 34.4* 31.7* 18.5*  HGB 15.8 13.4 11.1* 10.7* 9.6*  HCT 45.9 39.3 32.4* 31.3* 28.5*  PLT 379 350 209 226 150  MCV 84.1 84.5 84.4 84.8 85.3  MCH 28.9 28.8 28.9 29.0 28.7  MCHC 34.4 34.1 34.3 34.2 33.7  RDW 15.6* 15.8* 15.9* 16.1* 16.2*  LYMPHSABS 0.9  --   --   --   --   MONOABS 0.9  --   --   --   --   EOSABS 0.0  --   --   --   --   BASOSABS 0.0  --   --   --   --     Chemistries   Recent Labs Lab 08/18/16 1521 08/19/16 0317 08/20/16 0415 08/21/16 0414  NA 136 139 135 137  K 4.9 4.2 3.0* 3.7  CL 88* 93* 96* 104  CO2 36* 32 28 27  GLUCOSE 201* 173* 117* 92  BUN 31* 32* 40* 26*  CREATININE 1.34* 1.41* 1.23 0.86  CALCIUM 9.6 9.3 8.2* 8.0*  MG  --   --   --  1.5*  AST 27 25  --   --   ALT 11* 12*  --   --   ALKPHOS 74 61  --   --   BILITOT 0.9 0.8  --   --    ------------------------------------------------------------------------------------------------------------------ No results for input(s): CHOL, HDL, LDLCALC, TRIG, CHOLHDL, LDLDIRECT in the last 72 hours.  No results found for: HGBA1C ------------------------------------------------------------------------------------------------------------------ No results for input(s): TSH, T4TOTAL, T3FREE, THYROIDAB in the last 72 hours.  Invalid input(s): FREET3 ------------------------------------------------------------------------------------------------------------------ No results for input(s): VITAMINB12, FOLATE, FERRITIN, TIBC, IRON, RETICCTPCT in the last 72 hours.  Coagulation profile  Recent Labs Lab 08/18/16 1521 08/19/16 0317 08/21/16 0414  INR  3.14 3.00 1.46    No results for input(s): DDIMER in the last 72 hours.  Cardiac Enzymes No results for input(s): CKMB, TROPONINI, MYOGLOBIN in the last 168 hours.  Invalid input(s): CK ------------------------------------------------------------------------------------------------------------------ No results found for: BNP  Inpatient Medications  Scheduled Meds: . Chlorhexidine Gluconate Cloth  6 each Topical Q0600  . mupirocin ointment  1 application Nasal BID  . pantoprazole (PROTONIX) IV  40 mg Intravenous Q12H  . piperacillin-tazobactam (ZOSYN)  IV  3.375 g Intravenous Q8H  . vancomycin  1,000 mg Intravenous Q24H   Continuous Infusions: . 0.9 % NaCl with KCl 40 mEq / L 100 mL/hr at 08/21/16 1046  . norepinephrine (LEVOPHED) Adult infusion 2.5 mcg/min (08/21/16 1107)   PRN Meds:.acetaminophen **OR** acetaminophen, promethazine  Micro Results Recent Results (from the past 240 hour(s))  Blood Culture (routine x 2)     Status: None (Preliminary result)  Collection Time: 08/18/16  3:23 PM  Result Value Ref Range Status   Specimen Description LEFT ANTECUBITAL  Final   Special Requests BOTTLES DRAWN AEROBIC AND ANAEROBIC 6CC EACH  Final   Culture NO GROWTH 3 DAYS  Final   Report Status PENDING  Incomplete  Blood Culture (routine x 2)     Status: None (Preliminary result)   Collection Time: 08/18/16  3:53 PM  Result Value Ref Range Status   Specimen Description LEFT ANTECUBITAL  Final   Special Requests BOTTLES DRAWN AEROBIC AND ANAEROBIC 8CC EACH  Final   Culture NO GROWTH 3 DAYS  Final   Report Status PENDING  Incomplete  MRSA PCR Screening     Status: Abnormal   Collection Time: 08/18/16  8:19 PM  Result Value Ref Range Status   MRSA by PCR POSITIVE (A) NEGATIVE Final    Comment:        The GeneXpert MRSA Assay (FDA approved for NASAL specimens only), is one component of a comprehensive MRSA colonization surveillance program. It is not intended to diagnose  MRSA infection nor to guide or monitor treatment for MRSA infections. RESULT CALLED TO, READ BACK BY AND VERIFIED WITH:  HEARN,J @ 2342 ON 08/18/16 BY JUW   Urine culture     Status: Abnormal   Collection Time: 08/19/16 12:00 PM  Result Value Ref Range Status   Specimen Description URINE, CATHETERIZED  Final   Special Requests NONE  Final   Culture MULTIPLE SPECIES PRESENT, SUGGEST RECOLLECTION (A)  Final   Report Status 08/21/2016 FINAL  Final    Radiology Reports Ct Abdomen Pelvis Wo Contrast  Result Date: 08/18/2016 CLINICAL DATA:  Weakness, vomiting, and hypotension. EXAM: CT ABDOMEN AND PELVIS WITHOUT CONTRAST TECHNIQUE: Multidetector CT imaging of the abdomen and pelvis was performed following the standard protocol without IV contrast. COMPARISON:  CT scans since June 15, 2014 FINDINGS: Lower chest: The small left pleural effusion is decreased in size in the interval. The right-sided pleural effusion has resolved. There is fluid in the distal esophagus. Coronary artery calcifications are noted. Mild dependent atelectasis. No acute abnormalities in the lung bases. Hepatobiliary: Cholelithiasis is identified. The liver is unchanged. Pancreas: Unremarkable. No pancreatic ductal dilatation or surrounding inflammatory changes. Spleen: Normal in size without focal abnormality. Adrenals/Urinary Tract: Multiple simple and hyperdense renal cysts are seen, unchanged. The largest is on the right measuring at least 11 cm in transverse dimension. Vascular calcifications are associated with the kidneys. There are also renal stones. The stone in the right renal pelvis is larger in the interval measuring 17 by 9 mm. There is no resulting hydronephrosis but there is mild increased attenuation of fat adjacent to the right renal pelvis. Remainder of the right ureter is normal in caliber. No left-sided stones. The left ureter is normal. There is a Foley catheter in the bladder. Multiple stones are seen  primarily dependently in the bladder. There is mild stranding adjacent to the bladder which could be seen with cystitis. This finding is similar in the interval however. Stomach/Bowel: The stomach is markedly distended. This finding is worsened in the interval. 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 The duodenum itself is normal in caliber. The remainder of the small bowel is normal in caliber as well. The patient is status post partial colectomy. There is a left lower quadrant ostomy. The remainder of the colon is unremarkable. Scattered diverticuli without diverticulitis. No evidence of appendicitis. Vascular/Lymphatic: Atherosclerotic change is  seen in the non aneurysmal aorta. No adenopathy. Reproductive: Prostate is unremarkable. Other: Mild increased attenuation in the fat of the pericolic gutters is stable as is the mild increased attenuation anterior to the sacrum. Musculoskeletal: No interval bony changes identified. IMPRESSION: 1. Markedly distended stomach, worsened in the interval. This could be seen with gastric outlet obstruction or severe gastroparesis. 2. 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 3. Cholelithiasis. 4. Multiple renal cysts. The stone in the right renal pelvis has increased in the interval and there is now mild increased attenuation of fat adjacent to the right renal pelvis which could be due to intermittent obstruction. No hydronephrosis on today's study. 5. Multiple stones in the bladder. Mild fat stranding adjacent to the bladder could be due to cystitis. Recommend correlation with urinalysis to assess for UTI. 6. Atherosclerosis. 7. Diverticulosis. Electronically Signed   By: Gerome Samavid  Williams III M.D   On: 08/18/2016 17:58   Dg Chest Port 1 View  Result Date: 08/19/2016 CLINICAL DATA:  Hypoxia. EXAM: PORTABLE CHEST 1 VIEW COMPARISON:  08/18/2016 and 05/10/2016 FINDINGS: NG tube  tip is in the left upper quadrant the abdomen. Pacemaker in place. There is a patchy infiltrate at the right lung base. Left lung is clear. Heart size and vascularity are normal. Calcification in the arch of the aorta. No acute bone abnormality. IMPRESSION: New patchy infiltrate at the right lung base. Aortic atherosclerosis. Electronically Signed   By: Francene BoyersJames  Maxwell M.D.   On: 08/19/2016 09:48   Dg Chest Port 1 View  Result Date: 08/18/2016 CLINICAL DATA:  Hypotension and vomiting today.  05/10/2016 EXAM: PORTABLE CHEST 1 VIEW COMPARISON:  None. FINDINGS: Left-sided transvenous pacemaker leads to the right atrium and right ventricle. Heart is mildly enlarged. Aorta is tortuous and partially calcified. Lungs are clear. No pulmonary edema. No free intraperitoneal air beneath the diaphragm. IMPRESSION: Stable cardiomegaly.  No evidence for acute pulmonary abnormality. Electronically Signed   By: Norva PavlovElizabeth  Brown M.D.   On: 08/18/2016 15:30   Dg Chest Port 1v Same Day  Result Date: 08/19/2016 CLINICAL DATA:  PICC line placement EXAM: PORTABLE CHEST 1 VIEW COMPARISON:  08/19/2016 FINDINGS: Left chest wall pacer device is noted with lead in the right atrial appendage and right ventricle. Normal heart size. Aortic atherosclerosis noted. The right arm PICC line tip is at the cavoatrial junction. Patchy infiltrate within the right base is unchanged from previous exam. Left lung is clear. IMPRESSION: 1. Right arm PICC line tip is at the cavoatrial junction. 2. Persistent right base infiltrate. Electronically Signed   By: Signa Kellaylor  Stroud M.D.   On: 08/19/2016 12:02   Dg Abd 2 Views  Result Date: 08/18/2016 CLINICAL DATA:  Nausea and vomiting. History of gastritis and hypertension. EXAM: ABDOMEN - 2 VIEW COMPARISON:  05/04/2016 FINDINGS: Two supine views. The more cephalad imaged excludes the far left-sided abdomen. Nasogastric tube has been removed. Moderate-to-marked gaseous distension of the stomach. No gross free  intraperitoneal air. No bowel distension. Osteopenia. Right hip osteoarthritis. IMPRESSION: Gaseous distension of the stomach, nonspecific.  CT pending. Electronically Signed   By: Jeronimo GreavesKyle  Talbot M.D.   On: 08/18/2016 16:57    Critical care time 45 minutes   Chaya JanHERNANDEZ ACOSTA,Rebekkah Powless M.D on 08/21/2016 at 3:16 PM  Between 7am to 7pm - Pager - [phone]  After 7pm go to [email or web address] - password [email or web address]  [facility] -  Office  [phone]

## 2016-08-22 ENCOUNTER — Inpatient Hospital Stay (HOSPITAL_COMMUNITY): Payer: Medicare Other

## 2016-08-22 DIAGNOSIS — D72829 Elevated white blood cell count, unspecified: Secondary | ICD-10-CM

## 2016-08-22 LAB — CBC
HEMATOCRIT: 29.1 % — AB (ref 39.0–52.0)
HEMOGLOBIN: 9.6 g/dL — AB (ref 13.0–17.0)
MCH: 28.3 pg (ref 26.0–34.0)
MCHC: 33 g/dL (ref 30.0–36.0)
MCV: 85.8 fL (ref 78.0–100.0)
Platelets: 120 10*3/uL — ABNORMAL LOW (ref 150–400)
RBC: 3.39 MIL/uL — AB (ref 4.22–5.81)
RDW: 16.5 % — ABNORMAL HIGH (ref 11.5–15.5)
WBC: 11.5 10*3/uL — ABNORMAL HIGH (ref 4.0–10.5)

## 2016-08-22 LAB — HEMOGLOBIN A1C
Hgb A1c MFr Bld: 5.3 % (ref 4.8–5.6)
MEAN PLASMA GLUCOSE: 105 mg/dL

## 2016-08-22 LAB — BASIC METABOLIC PANEL
ANION GAP: 5 (ref 5–15)
BUN: 18 mg/dL (ref 6–20)
CALCIUM: 8.1 mg/dL — AB (ref 8.9–10.3)
CO2: 24 mmol/L (ref 22–32)
Chloride: 109 mmol/L (ref 101–111)
Creatinine, Ser: 0.91 mg/dL (ref 0.61–1.24)
GFR calc Af Amer: >60 mL/min (ref >60)
GLUCOSE: 90 mg/dL (ref 65–99)
POTASSIUM: 4.7 mmol/L (ref 3.5–5.1)
Sodium: 138 mmol/L (ref 135–145)

## 2016-08-22 LAB — GLUCOSE, CAPILLARY
GLUCOSE-CAPILLARY: 128 mg/dL — AB (ref 65–99)
GLUCOSE-CAPILLARY: 84 mg/dL (ref 65–99)
GLUCOSE-CAPILLARY: 91 mg/dL (ref 65–99)

## 2016-08-22 MED ORDER — RESOURCE THICKENUP CLEAR PO POWD
ORAL | Status: DC | PRN
  Filled 2016-08-22: qty 125

## 2016-08-22 NOTE — Progress Notes (Signed)
Daily Progress Note   Patient Name: Jon CutterWilliam P [name]       Date: 08/22/2016 DOB: 1932-01-02  Age: 83 y.o. MRN#: [id number] Attending Physician: Micael HampshireEstela Y Hernandez Acost* Primary Care Physician: Milana ObeyStephen D Knowlton, MD Admit Date: 08/18/2016  Reason for Consultation/Follow-up: Disposition, Establishing goals of care and Psychosocial/spiritual support  Subjective: Jon [name] is lying quietly in bed. He greets me making and keeping eye contact as I enter. Present at bedside today is his son Tawanna Coolerodd. Jon [name] is able to communicate effectively, making his needs known and following commands. He is able to lift both arms to shoulder level but not higher. He has limited mobility in his legs. We talk about his quality of life, his functional status prior to coming into the hospital this time. Jon [name] states that he has been basically bedbound since September 2016. He does state that he has a good quality of life, he enjoys watching television.   Jon [name] shares his recent losses, Including the loss of his son in July, the loss of his wife (both feel that she gave up after the loss of her son), and his sister-in-law who lived beside them for many years. We talk about his chronic illnesses including the likelihood of recurrent UTIs, and the possibility of recurrent pneumonia. We also talk about 3 hospitalizations in the last 6 months. I share my worry over continued functional decline.   We talk about healthcare power of attorney, Jon [name] names Tawanna Coolerodd as his Management consultantdecision maker. He states that he has no one else, and that if he did not have Tawanna Coolerodd he would have to go into a nursing home. He acknowledges that providing care is a lot of work for [employer]. Jon [name] talks about his church family and how they  help.  I asked Jon [name] if Tawanna Coolerodd knows what he does want and what he does not want. Minster lamberts states that he would not want a to defeat him if he could not eat for himself. I share with Jon [name] said that Tawanna Coolerodd had let us know that during his previous hospitalization he had elected to "allow a natural death", but that when he got home things looked different. I asked Jon [name] if his heart naturally stops, would he want us to try to restart it, if his breathing  got low would he want a tube into his lung to breathe for him?  Jon [name] shakes his head no and states that he does not want those things, that we should let him go. I share with Jon [name] that we will continue to treat the treatable, that this does not mean he will not get treatment. Tawanna Cooler hears his father's choices.  I bring up the concept of hospice in home. I share the benefits including an aide to help with bathing, nursing staff, social workers and chaplains.  Tawanna Cooler states that they have a home health nurse through [facility], they have the availability of an aide, but they are not using the service at this time. Mr. Lalla Brothers states that he has experience with hospice. I share that I wanted let them know about this was a free benefit, already paid for in Medicare, that may help them in the future. Neither Tawanna Cooler nor Mr. Kader have questions at this time. I share that I will come and see them tomorrow.  Length of Stay: 3  Current Medications: Scheduled Meds:  . Chlorhexidine Gluconate Cloth  6 each Topical Q0600  . mupirocin ointment  1 application Nasal BID  . pantoprazole (PROTONIX) IV  40 mg Intravenous Q12H  . piperacillin-tazobactam (ZOSYN)  IV  3.375 g Intravenous Q8H  . vancomycin  1,000 mg Intravenous Q24H    Continuous Infusions: . 0.9 % NaCl with KCl 40 mEq / L 100 mL/hr at 08/22/16 1000  . norepinephrine (LEVOPHED) Adult infusion Stopped (08/22/16 0000)    PRN Meds: acetaminophen **OR**  acetaminophen, promethazine, RESOURCE THICKENUP CLEAR  Physical Exam  Constitutional: No distress.  Chronically ill appearing, makes and keeps eye contact. Calm, cooperative  HENT:  Head: Normocephalic and atraumatic.  Cardiovascular: Normal rate and regular rhythm.   Great 120s at times  Pulmonary/Chest: Effort normal. No respiratory distress.  Abdominal: Soft.  Flat stomach, ostomy  Musculoskeletal: He exhibits no edema.  Neurological: He is alert.  Skin: Skin is warm and dry.  Nursing note and vitals reviewed.           Vital Signs: BP 108/90   Pulse (!) 126   Temp 98.6 F (37 C) (Oral)   Resp (!) 24   Ht 5\' 10"  (1.778 m)   Wt 61.3 kg (135 lb 2.3 oz)   SpO2 100%   BMI 19.39 kg/m  SpO2: SpO2: 100 % O2 Device: O2 Device: High Flow Nasal Cannula O2 Flow Rate: O2 Flow Rate (L/min): 2 L/min  Intake/output summary:  Intake/Output Summary (Last 24 hours) at 08/22/16 1121 Last data filed at 08/22/16 1000  Gross per 24 hour  Intake          2254.33 ml  Output              900 ml  Net          1354.33 ml   LBM: Last BM Date: 08/22/16 Baseline Weight: Weight: 61.7 kg (136 lb) Most recent weight: Weight: 61.3 kg (135 lb 2.3 oz)       Palliative Assessment/Data:    Flowsheet Rows   Flowsheet Row Most Recent Value  Intake Tab  Referral Department  -- [GI]  Unit at Time of Referral  ICU  Palliative Care Primary Diagnosis  Sepsis/Infectious Disease  Date Notified  08/19/16  Palliative Care Type  New Palliative care  Reason for referral  Clarify Goals of Care  Date of Admission  08/18/16  Date first seen by Palliative  Care  08/20/16  # of days Palliative referral response time  1 Day(s)  # of days IP prior to Palliative referral  1  Clinical Assessment  Palliative Performance Scale Score  20%  Pain Max last 24 hours  Not able to report  Pain Min Last 24 hours  Not able to report  Dyspnea Max Last 24 Hours  Not able to report  Dyspnea Min Last 24 hours  Not able to  report  Psychosocial & Spiritual Assessment  Palliative Care Outcomes  Patient/Family meeting held?  Yes  Who was at the meeting?  patient and son Tawanna Cooler  Palliative Care Outcomes  Provided advance care planning, Provided psychosocial or spiritual support, Clarified goals of care  Palliative Care follow-up planned  -- [follow up while at [facility]]      Patient Active Problem List   Diagnosis Date Noted  . N&V (nausea and vomiting)   . Goals of care, counseling/discussion   . DNR (do not resuscitate) discussion   . Palliative care encounter   . Pressure injury of skin 08/19/2016  . Septic shock (HCC) 08/19/2016  . Gastric distention   . Heme positive stool   . UGI bleed 08/18/2016  . Sacral decubitus ulcer, stage IV (HCC) 2020/10/1315  . Epistaxis 05/10/2016  . Diarrhea 05/02/2016  . Prolonged QT interval   . Paroxysmal atrial fibrillation (HCC)   . Tachycardia-bradycardia syndrome (HCC)   . Cardiac pacemaker in situ   . Hematemesis 05/15/2015  . Gastrointestinal hemorrhage 05/15/2015  . Malnutrition of moderate degree (HCC) 07/11/2014  . Perianal infection 07/06/2014  . Decubitus ulcer 07/06/2014  . Sacral ulcer (HCC) 07/06/2014  . Anemia of chronic disease 06/16/2014  . Sepsis (HCC) 06/15/2014  . Lower urinary tract infectious disease 06/15/2014  . Acute kidney injury (HCC) 06/15/2014  . Bilateral hydronephrosis 06/15/2014  . Bladder calculi 06/15/2014  . Rhabdomyolysis 06/15/2014  . Multiple rib fractures 06/15/2014  . Hypotension 06/15/2014  . S/P placement of cardiac pacemaker 06/15/2014  . Dementia 06/15/2014  . AKI (acute kidney injury) (HCC) 06/15/2014  . Knee pain   . Dehydration 06/09/2014  . Hypertension 06/09/2014  . Abnormality of gait 05/03/2013  . Essential and other specified forms of tremor 05/03/2013  . Polyneuropathy in other diseases classified elsewhere (HCC) 05/03/2013  . Atrial fibrillation (HCC) 09/07/2012  . Long term current use of anticoagulant  therapy 09/07/2012    Palliative Care Assessment & Plan   Patient Profile: 83 y.o.malewith past medical history of Bilateral hydronephrosis, bladder calculi, gastritis, bleeding ulcer, essential hypertension, multiple rib fractures, neuropathy, obstructive sleep apnea does not use CPAP, PAF, Tachy Brady syndrome, varicose veins, history of stage V decubitus with subsequent ostomy and permanent indwelling Foley catheter, basically bedbound at the stage admitted on 3/4/2018with upper G.I. bleed, a fib.   Assessment: G.I. Bleed; hemoglobin stable in the 9's.  frailty, functional declines; [facility]. Hospital bed in the home, new wheelchair ramp built recently, per son Tawanna Cooler Jon [name] spends his day in bed. He states that he feels he has a good quality of life, he enjoys watching television. Have a Hoyer lift, his goal is to improve his functional ability.  Recommendations/Plan:  Continue to treat the treatable, continued end-of-life discussions, at this point, continue to plan for return home with [facility].   Goals of Care and Additional Recommendations:  Limitations on Scope of Treatment: Treat the treatable but no extraordinary measures such as CPR or intubation  Code Status:    Code Status Orders        Start     Ordered   08/22/16 1122  Do not attempt resuscitation (DNR)  Continuous    Question Answer Comment  In the event of cardiac or respiratory ARREST Do not call a "code blue"   In the event of cardiac or respiratory ARREST Do not perform Intubation, CPR, defibrillation or ACLS   In the event of cardiac or respiratory ARREST Use medication by any route, position, wound care, and other measures to relive pain and suffering. May use oxygen, suction and manual treatment of airway obstruction as needed for comfort.      08/22/16 1121    Code Status History    Date Active Date Inactive Code Status Order ID Comments User  Context   08/18/2016  8:56 PM 08/22/2016 11:21 AM Full Code [id number]  Meredeth Ide, MD Inpatient   05/10/2016  3:56 PM 05/13/2016  6:28 PM Full Code [id number]  Henderson Cloud, MD ED   05/02/2016 12:48 PM 05/06/2016  5:29 PM DNR [id number]  Leroy Sea, MD Inpatient   05/15/2015  4:03 PM 05/18/2015  6:01 PM Full Code [id number]  Standley Brooking, MD ED   07/11/2014 11:59 AM 07/12/2014  7:32 PM Full Code [id number]  Dalia Heading, MD Inpatient   07/06/2014  9:30 PM 07/11/2014 11:59 AM Full Code [id number]  Doree Albee, MD ED   06/15/2014 10:41 AM 06/18/2014  6:56 PM Full Code [id number]  Standley Brooking, MD Inpatient   06/09/2014  4:50 PM 06/11/2014  9:19 PM Full Code [id number]  Wilson Singer, MD ED       Prognosis:   < 6 months would not be surprising based on 3 hospitalizations no last 6 months, functional decline, hospitalization with sepsis, bedbound state.   Discharge Planning:  [facility].   The benefits of hospice were discussed today.  Care plan was discussed with nursing staff, case manager, social worker, and Dr. Viann Fish.  Thank you for allowing the Palliative Medicine Team to assist in the care of this patient.   Time In: 1100 Time Out: 1140 Total Time 40 minutes Prolonged Time Billed  no       Greater than 50%  of this time was spent counseling and coordinating care related to the above assessment and plan.  Katheran Awe, NP  Please contact Palliative Medicine Team phone at [phone] for questions and concerns.

## 2016-08-22 NOTE — Progress Notes (Signed)
PROGRESS NOTE                                                                                                                                                                                                             Patient Demographics:    Jon [name], is a 83 y.o. male, DOB - [birth year], ZOX:[id number]  Admit date - 08/18/2016   Admitting Physician Meredeth Ide, MD  Outpatient Primary MD for the patient is Milana Obey, MD   Outpatient Specialists: Cardiology Dr. Loney Hering, GI Dr Darrick Penna.  Chief Complaint  Patient presents with  . Emesis       Brief Narrative   83 y.o. male, with a past medical history significant for PAF On warfarin , tachybradycardia syndrome status post pacemaker, chronic diastolic CHF, neuropathy, OSA, HTN, anxiety, s/p diverting colostomy & indwelling Foley due to Sacral Ulcer, and ambulatory at bedside, bedbound, patient was brought to the hospital due to several episodes of coffee-ground emesis, patient with known colostomy has not been draining much for few days, CT abdomen pelvis with markedly distended stomach possible gastric outlet obstruction versus severe gastroparesis, as well multiple stones in the urinary bladder possible cystitis, patient was found to have septic shock, required to ICU and started on pressors.   Subjective:    Jon [name] today has, No headache, No chest pain, No abdominal pain or Shortness of breath, report generalized weakness and fatigue.   Assessment  & Plan :    Active Problems:   Hypotension   UGI bleed   Pressure injury of skin   Gastric distention   Heme positive stool   Septic shock (HCC)   Goals of care, counseling/discussion   DNR (do not resuscitate) discussion   Palliative care encounter   N&V (nausea and vomiting)   Septic shock - Hypotension improved, Has been weaned off pressors. - Follow on blood cultures, so far no growth on day 4. - Chest x-ray with  evidence of right lower lobe pneumonia, most likely aspiration pneumonia, already on vac and Zosyn. Continue Vanc as MRSA PCR is positive. - Sacral  pressure ulcer appears to be healing nicely with no evidence of infection - Sepsis-most likely related to urinary source and aspiration PNA, follow on urine cultures, foley catheter has been changed. -significant improvement overnight in leukocytosis, now down to 11.5 from  a high of 34.4.  Gastric outlet obstruction - NGT removed. No N/V. -Appreciate GI input: to consider EGD once no longer on pressors. Patient most likely has gastroparesis. EGD in 11/16 without pyloric stenosis. -Has been started on a dysphagia 1 diet with nectar thick liquids per speech therapy recommendations. -GI still to determine whether he will have an EGD prior to discharge to rule out partial gastric outlet obstruction.  Acute hypoxic respiratory failure -Likely due to aspiration PNA, -Improved, continue to wean oxygen as tolerated.  Aspiration pneumonia - Continue with vancomycin and Zosyn (continue vancomycin due to positive MRSA PCR).  Chronic diastolic CHF - Recent echo on 91/4782 with EF 65%, grade 2 diastolic dysfunction. -No signs of volume overload at present.  Atrial fibrillation/tachybradycardia syndrome - Patient with known history of A. Fib, on warfarin supratherapeutic INR presentation of 3.14. - Continue to hold warfarin for presumed GI bleed, continue to hold sotalol for hypertension.  History of hypertension - Lisinopril and sotalol given septic shock  Coagulopathy - Both therapeutic INR on admission 3.1 ,received  vitamin K and FFP, INR 1.46 on 3/7.  Hypokalemia - Replaced.  Protein calorie malnutrition -Start diet pending ST recs.  Pressure ulcer - Appears to be healing nicely, almost closed no discharge or evidence of infection on physical exam  Hyperglycemia - Check hemoglobin A1c, CBG every 6 time X4  Code Status : Full  Family  Communication  : Son at bedside  Disposition Plan  : remains in ICU, try to wean pressors today.  Consults  :  GI  Procedures  : None  DVT Prophylaxis  :   SCDs   Lab Results  Component Value Date   PLT 120 (L) 08/22/2016    Antibiotics  :    Anti-infectives    Start     Dose/Rate Route Frequency Ordered Stop   08/20/16 1100  metroNIDAZOLE (FLAGYL) IVPB 500 mg  Status:  Discontinued     500 mg 100 mL/hr over 60 Minutes Intravenous Every 8 hours 08/20/16 1052 08/21/16 1159   08/20/16 0745  vancomycin (VANCOCIN) 500 mg in sodium chloride irrigation 0.9 % 100 mL ENEMA  Status:  Discontinued     500 mg Rectal Every 6 hours 08/20/16 0739 08/20/16 0739   08/19/16 0500  vancomycin (VANCOCIN) IVPB 1000 mg/200 mL premix     1,000 mg 200 mL/hr over 60 Minutes Intravenous Every 24 hours 08/18/16 1845     08/18/16 2300  piperacillin-tazobactam (ZOSYN) IVPB 3.375 g     3.375 g 12.5 mL/hr over 240 Minutes Intravenous Every 8 hours 08/18/16 1845     08/18/16 1700  piperacillin-tazobactam (ZOSYN) IVPB 3.375 g     3.375 g 100 mL/hr over 30 Minutes Intravenous  Once 08/18/16 1652 08/18/16 1729   08/18/16 1700  vancomycin (VANCOCIN) IVPB 1000 mg/200 mL premix     1,000 mg 200 mL/hr over 60 Minutes Intravenous  Once 08/18/16 1652 08/18/16 1833        Objective:   Vitals:   08/22/16 1252 08/22/16 1400 08/22/16 1500 08/22/16 1600  BP:  (!) 118/105 110/81 115/82  Pulse:  (!) 112 (!) 117 (!) 125  Resp:  20 (!) 26 (!) 21  Temp: 98.2 F (36.8 C)     TempSrc: Oral     SpO2:  100% 100% 100%  Weight:      Height:        Wt Readings from Last 3 Encounters:  08/22/16 61.3 kg (135 lb 2.3 oz)  05/11/16 62 kg (136 lb 11 oz)  05/05/16 68.2 kg (150 lb 5.7 oz)     Intake/Output Summary (Last 24 hours) at 08/22/16 1724 Last data filed at 08/22/16 1500  Gross per 24 hour  Intake           2774.5 ml  Output              350 ml  Net           2424.5 ml     Physical Exam  Awake Alert,  Oriented X 3, Frail, chronically ill-appearing Disheveled, long nails Supple Neck,No JVD, .  Symmetrical Chest wall movement, diminished air movement bilaterally, no wheezing Irreg,No Gallops,Rubs or new Murmurs, No Parasternal Heave +ve B.Sounds, Abd Soft, No tenderness, No rebound - guarding or rigidity, has colostomy bag which is empty Long nails, poor skin condition, hyperkeratosis, saccular pressure ulcer which is unstageable as it is healing and closed, no evidence of discharge or infection    Data Review:    CBC  Recent Labs Lab 08/18/16 1521 08/19/16 0317 08/19/16 1751 08/20/16 0415 08/21/16 0414 08/22/16 0419  WBC 23.2* 31.2* 34.4* 31.7* 18.5* 11.5*  HGB 15.8 13.4 11.1* 10.7* 9.6* 9.6*  HCT 45.9 39.3 32.4* 31.3* 28.5* 29.1*  PLT 379 350 209 226 150 120*  MCV 84.1 84.5 84.4 84.8 85.3 85.8  MCH 28.9 28.8 28.9 29.0 28.7 28.3  MCHC 34.4 34.1 34.3 34.2 33.7 33.0  RDW 15.6* 15.8* 15.9* 16.1* 16.2* 16.5*  LYMPHSABS 0.9  --   --   --   --   --   MONOABS 0.9  --   --   --   --   --   EOSABS 0.0  --   --   --   --   --   BASOSABS 0.0  --   --   --   --   --     Chemistries   Recent Labs Lab 08/18/16 1521 08/19/16 0317 08/20/16 0415 08/21/16 0414 08/22/16 0419  NA 136 139 135 137 138  K 4.9 4.2 3.0* 3.7 4.7  CL 88* 93* 96* 104 109  CO2 36* 32 28 27 24   GLUCOSE 201* 173* 117* 92 90  BUN 31* 32* 40* 26* 18  CREATININE 1.34* 1.41* 1.23 0.86 0.91  CALCIUM 9.6 9.3 8.2* 8.0* 8.1*  MG  --   --   --  1.5*  --   AST 27 25  --   --   --   ALT 11* 12*  --   --   --   ALKPHOS 74 61  --   --   --   BILITOT 0.9 0.8  --   --   --    ------------------------------------------------------------------------------------------------------------------ No results for input(s): CHOL, HDL, LDLCALC, TRIG, CHOLHDL, LDLDIRECT in the last 72 hours.  Lab Results  Component Value Date   HGBA1C 5.3 08/20/2016    ------------------------------------------------------------------------------------------------------------------ No results for input(s): TSH, T4TOTAL, T3FREE, THYROIDAB in the last 72 hours.  Invalid input(s): FREET3 ------------------------------------------------------------------------------------------------------------------ No results for input(s): VITAMINB12, FOLATE, FERRITIN, TIBC, IRON, RETICCTPCT in the last 72 hours.  Coagulation profile  Recent Labs Lab 08/18/16 1521 08/19/16 0317 08/21/16 0414  INR 3.14 3.00 1.46    No results for input(s): DDIMER in the last 72 hours.  Cardiac Enzymes No results for input(s): CKMB, TROPONINI, MYOGLOBIN in the last 168 hours.  Invalid input(s): CK ------------------------------------------------------------------------------------------------------------------ No results found for: BNP  Inpatient Medications  Scheduled Meds: .  Chlorhexidine Gluconate Cloth  6 each Topical Q0600  . mupirocin ointment  1 application Nasal BID  . pantoprazole (PROTONIX) IV  40 mg Intravenous Q12H  . piperacillin-tazobactam (ZOSYN)  IV  3.375 g Intravenous Q8H  . vancomycin  1,000 mg Intravenous Q24H   Continuous Infusions: . 0.9 % NaCl with KCl 40 mEq / L 100 mL/hr (08/22/16 1455)  . norepinephrine (LEVOPHED) Adult infusion Stopped (08/22/16 0000)   PRN Meds:.acetaminophen **OR** acetaminophen, promethazine, RESOURCE THICKENUP CLEAR  Micro Results Recent Results (from the past 240 hour(s))  Blood Culture (routine x 2)     Status: None (Preliminary result)   Collection Time: 08/18/16  3:23 PM  Result Value Ref Range Status   Specimen Description LEFT ANTECUBITAL  Final   Special Requests BOTTLES DRAWN AEROBIC AND ANAEROBIC 6CC EACH  Final   Culture NO GROWTH 4 DAYS  Final   Report Status PENDING  Incomplete  Blood Culture (routine x 2)     Status: None (Preliminary result)   Collection Time: 08/18/16  3:53 PM  Result Value Ref Range  Status   Specimen Description LEFT ANTECUBITAL  Final   Special Requests BOTTLES DRAWN AEROBIC AND ANAEROBIC 8CC EACH  Final   Culture NO GROWTH 4 DAYS  Final   Report Status PENDING  Incomplete  MRSA PCR Screening     Status: Abnormal   Collection Time: 08/18/16  8:19 PM  Result Value Ref Range Status   MRSA by PCR POSITIVE (A) NEGATIVE Final    Comment:        The GeneXpert MRSA Assay (FDA approved for NASAL specimens only), is one component of a comprehensive MRSA colonization surveillance program. It is not intended to diagnose MRSA infection nor to guide or monitor treatment for MRSA infections. RESULT CALLED TO, READ BACK BY AND VERIFIED WITH:  HEARN,J @ 2342 ON 08/18/16 BY JUW   Urine culture     Status: Abnormal   Collection Time: 08/19/16 12:00 PM  Result Value Ref Range Status   Specimen Description URINE, CATHETERIZED  Final   Special Requests NONE  Final   Culture MULTIPLE SPECIES PRESENT, SUGGEST RECOLLECTION (A)  Final   Report Status 08/21/2016 FINAL  Final    Radiology Reports Ct Abdomen Pelvis Wo Contrast  Result Date: 08/18/2016 CLINICAL DATA:  Weakness, vomiting, and hypotension. EXAM: CT ABDOMEN AND PELVIS WITHOUT CONTRAST TECHNIQUE: Multidetector CT imaging of the abdomen and pelvis was performed following the standard protocol without IV contrast. COMPARISON:  CT scans since June 15, 2014 FINDINGS: Lower chest: The small left pleural effusion is decreased in size in the interval. The right-sided pleural effusion has resolved. There is fluid in the distal esophagus. Coronary artery calcifications are noted. Mild dependent atelectasis. No acute abnormalities in the lung bases. Hepatobiliary: Cholelithiasis is identified. The liver is unchanged. Pancreas: Unremarkable. No pancreatic ductal dilatation or surrounding inflammatory changes. Spleen: Normal in size without focal abnormality. Adrenals/Urinary Tract: Multiple simple and hyperdense renal cysts are seen,  unchanged. The largest is on the right measuring at least 11 cm in transverse dimension. Vascular calcifications are associated with the kidneys. There are also renal stones. The stone in the right renal pelvis is larger in the interval measuring 17 by 9 mm. There is no resulting hydronephrosis but there is mild increased attenuation of fat adjacent to the right renal pelvis. Remainder of the right ureter is normal in caliber. No left-sided stones. The left ureter is normal. There is a Foley catheter in the bladder.  Multiple stones are seen primarily dependently in the bladder. There is mild stranding adjacent to the bladder which could be seen with cystitis. This finding is similar in the interval however. Stomach/Bowel: The stomach is markedly distended. This finding is worsened in the interval. 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 The duodenum itself is normal in caliber. The remainder of the small bowel is normal in caliber as well. The patient is status post partial colectomy. There is a left lower quadrant ostomy. The remainder of the colon is unremarkable. Scattered diverticuli without diverticulitis. No evidence of appendicitis. Vascular/Lymphatic: Atherosclerotic change is seen in the non aneurysmal aorta. No adenopathy. Reproductive: Prostate is unremarkable. Other: Mild increased attenuation in the fat of the pericolic gutters is stable as is the mild increased attenuation anterior to the sacrum. Musculoskeletal: No interval bony changes identified. IMPRESSION: 1. Markedly distended stomach, worsened in the interval. This could be seen with gastric outlet obstruction or severe gastroparesis. 2. 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 3. Cholelithiasis. 4. Multiple renal cysts. The stone in the right renal pelvis has increased in the interval and there is now mild increased attenuation of fat adjacent  to the right renal pelvis which could be due to intermittent obstruction. No hydronephrosis on today's study. 5. Multiple stones in the bladder. Mild fat stranding adjacent to the bladder could be due to cystitis. Recommend correlation with urinalysis to assess for UTI. 6. Atherosclerosis. 7. Diverticulosis. Electronically Signed   By: Gerome Sam III M.D   On: 08/18/2016 17:58   Dg Chest Port 1 View  Result Date: 08/19/2016 CLINICAL DATA:  Hypoxia. EXAM: PORTABLE CHEST 1 VIEW COMPARISON:  08/18/2016 and 05/10/2016 FINDINGS: NG tube tip is in the left upper quadrant the abdomen. Pacemaker in place. There is a patchy infiltrate at the right lung base. Left lung is clear. Heart size and vascularity are normal. Calcification in the arch of the aorta. No acute bone abnormality. IMPRESSION: New patchy infiltrate at the right lung base. Aortic atherosclerosis. Electronically Signed   By: Francene Boyers M.D.   On: 08/19/2016 09:48   Dg Chest Port 1 View  Result Date: 08/18/2016 CLINICAL DATA:  Hypotension and vomiting today.  05/10/2016 EXAM: PORTABLE CHEST 1 VIEW COMPARISON:  None. FINDINGS: Left-sided transvenous pacemaker leads to the right atrium and right ventricle. Heart is mildly enlarged. Aorta is tortuous and partially calcified. Lungs are clear. No pulmonary edema. No free intraperitoneal air beneath the diaphragm. IMPRESSION: Stable cardiomegaly.  No evidence for acute pulmonary abnormality. Electronically Signed   By: Norva Pavlov M.D.   On: 08/18/2016 15:30   Dg Chest Port 1v Same Day  Result Date: 08/19/2016 CLINICAL DATA:  PICC line placement EXAM: PORTABLE CHEST 1 VIEW COMPARISON:  08/19/2016 FINDINGS: Left chest wall pacer device is noted with lead in the right atrial appendage and right ventricle. Normal heart size. Aortic atherosclerosis noted. The right arm PICC line tip is at the cavoatrial junction. Patchy infiltrate within the right base is unchanged from previous exam. Left lung is  clear. IMPRESSION: 1. Right arm PICC line tip is at the cavoatrial junction. 2. Persistent right base infiltrate. Electronically Signed   By: Signa Kell M.D.   On: 08/19/2016 12:02   Dg Abd 2 Views  Result Date: 08/18/2016 CLINICAL DATA:  Nausea and vomiting. History of gastritis and hypertension. EXAM: ABDOMEN - 2  VIEW COMPARISON:  05/04/2016 FINDINGS: Two supine views. The more cephalad imaged excludes the far left-sided abdomen. Nasogastric tube has been removed. Moderate-to-marked gaseous distension of the stomach. No gross free intraperitoneal air. No bowel distension. Osteopenia. Right hip osteoarthritis. IMPRESSION: Gaseous distension of the stomach, nonspecific.  CT pending. Electronically Signed   By: Jeronimo Greaves M.D.   On: 08/18/2016 16:57    Critical care time 45 minutes   Chaya Jan M.D on 08/22/2016 at 5:24 PM  Between 7am to 7pm - Pager - [phone]  After 7pm go to [email or web address] - password [email or web address]  [facility] -  Office  [phone]

## 2016-08-22 NOTE — Progress Notes (Signed)
REVIEWED.SPEECH RECS ON CHART. ORDER ENTERED FOR DIET. CONTINUE TO MONITOR SYMPTOMS. PT ON LEVOPHED AS OF 330 PM. NO ENDOSCOPY AT THIS TIME.     Subjective:  Feeling better. Doesn't like dysphagia 1 diet with nectar-thick liquids. Wants to drink with a straw to keep food out of beard. No other complaints.   Objective: Vital signs in last 24 hours: Temp:  [97.1 F (36.2 C)-99 F (37.2 C)] 98.6 F (37 C) (03/08 0710) Pulse Rate:  [81-102] 102 (03/08 0500) Resp:  [15-22] 15 (03/08 0710) BP: (89-117)/(65-77) 96/73 (03/08 0700) SpO2:  [100 %] 100 % (03/08 0600) Weight:  [135 lb 2.3 oz (61.3 kg)] 135 lb 2.3 oz (61.3 kg) (03/08 0500)   General:   Alert,  Well-developed, well-nourished, pleasant and cooperative in NAD Head:  Normocephalic and atraumatic. Eyes:  Sclera clear, no icterus. Abdomen:  Soft, nontender and nondistended.  Normal bowel sounds, without guarding, and without rebound.  No output in ostomy bag.  Extremities:  Without clubbing, deformity or edema. Neurologic:  Alert and  oriented x4;  grossly normal neurologically. Skin:  Intact without significant lesions or rashes. Psych:  Alert and cooperative. Normal mood and affect.  Intake/Output from previous day: 03/07 0701 - 03/08 0700 In: 3108.4 [I.V.:2745.9; IV Piggyback:362.5] Out: 900 [Urine:900] Intake/Output this shift: No intake/output data recorded.  Lab Results: CBC  Recent Labs  08/20/16 0415 08/21/16 0414 08/22/16 0419  WBC 31.7* 18.5* 11.5*  HGB 10.7* 9.6* 9.6*  HCT 31.3* 28.5* 29.1*  MCV 84.8 85.3 85.8  PLT 226 150 120*   BMET  Recent Labs  08/20/16 0415 08/21/16 0414 08/22/16 0419  NA 135 137 138  K 3.0* 3.7 4.7  CL 96* 104 109  CO2 28 27 24   GLUCOSE 117* 92 90  BUN 40* 26* 18  CREATININE 1.23 0.86 0.91  CALCIUM 8.2* 8.0* 8.1*   LFTs No results for input(s): BILITOT, BILIDIR, IBILI, ALKPHOS, AST, ALT, PROT, ALBUMIN in the last 72 hours. No results for input(s): LIPASE in the last  72 hours. PT/INR  Recent Labs  08/21/16 0414  LABPROT 17.9*  INR 1.46      Imaging Studies: Ct Abdomen Pelvis Wo Contrast  Result Date: 08/18/2016 CLINICAL DATA:  Weakness, vomiting, and hypotension. EXAM: CT ABDOMEN AND PELVIS WITHOUT CONTRAST TECHNIQUE: Multidetector CT imaging of the abdomen and pelvis was performed following the standard protocol without IV contrast. COMPARISON:  CT scans since June 15, 2014 FINDINGS: Lower chest: The small left pleural effusion is decreased in size in the interval. The right-sided pleural effusion has resolved. There is fluid in the distal esophagus. Coronary artery calcifications are noted. Mild dependent atelectasis. No acute abnormalities in the lung bases. Hepatobiliary: Cholelithiasis is identified. The liver is unchanged. Pancreas: Unremarkable. No pancreatic ductal dilatation or surrounding inflammatory changes. Spleen: Normal in size without focal abnormality. Adrenals/Urinary Tract: Multiple simple and hyperdense renal cysts are seen, unchanged. The largest is on the right measuring at least 11 cm in transverse dimension. Vascular calcifications are associated with the kidneys. There are also renal stones. The stone in the right renal pelvis is larger in the interval measuring 17 by 9 mm. There is no resulting hydronephrosis but there is mild increased attenuation of fat adjacent to the right renal pelvis. Remainder of the right ureter is normal in caliber. No left-sided stones. The left ureter is normal. There is a Foley catheter in the bladder. Multiple stones are seen primarily dependently in the bladder. There is mild stranding adjacent  to the bladder which could be seen with cystitis. This finding is similar in the interval however. Stomach/Bowel: The stomach is markedly distended. This finding is worsened in the interval. 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 The duodenum itself is normal in caliber. The remainder of  the small bowel is normal in caliber as well. The patient is status post partial colectomy. There is a left lower quadrant ostomy. The remainder of the colon is unremarkable. Scattered diverticuli without diverticulitis. No evidence of appendicitis. Vascular/Lymphatic: Atherosclerotic change is seen in the non aneurysmal aorta. No adenopathy. Reproductive: Prostate is unremarkable. Other: Mild increased attenuation in the fat of the pericolic gutters is stable as is the mild increased attenuation anterior to the sacrum. Musculoskeletal: No interval bony changes identified. IMPRESSION: 1. Markedly distended stomach, worsened in the interval. This could be seen with gastric outlet obstruction or severe gastroparesis. 2. 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 3. Cholelithiasis. 4. Multiple renal cysts. The stone in the right renal pelvis has increased in the interval and there is now mild increased attenuation of fat adjacent to the right renal pelvis which could be due to intermittent obstruction. No hydronephrosis on today's study. 5. Multiple stones in the bladder. Mild fat stranding adjacent to the bladder could be due to cystitis. Recommend correlation with urinalysis to assess for UTI. 6. Atherosclerosis. 7. Diverticulosis. Electronically Signed   By: Gerome Samavid  Williams III M.D   On: 08/18/2016 17:58   Dg Chest Port 1 View  Result Date: 08/19/2016 CLINICAL DATA:  Hypoxia. EXAM: PORTABLE CHEST 1 VIEW COMPARISON:  08/18/2016 and 05/10/2016 FINDINGS: NG tube tip is in the left upper quadrant the abdomen. Pacemaker in place. There is a patchy infiltrate at the right lung base. Left lung is clear. Heart size and vascularity are normal. Calcification in the arch of the aorta. No acute bone abnormality. IMPRESSION: New patchy infiltrate at the right lung base. Aortic atherosclerosis. Electronically Signed   By: Francene BoyersJames  Maxwell M.D.   On:  08/19/2016 09:48   Dg Chest Port 1 View  Result Date: 08/18/2016 CLINICAL DATA:  Hypotension and vomiting today.  05/10/2016 EXAM: PORTABLE CHEST 1 VIEW COMPARISON:  None. FINDINGS: Left-sided transvenous pacemaker leads to the right atrium and right ventricle. Heart is mildly enlarged. Aorta is tortuous and partially calcified. Lungs are clear. No pulmonary edema. No free intraperitoneal air beneath the diaphragm. IMPRESSION: Stable cardiomegaly.  No evidence for acute pulmonary abnormality. Electronically Signed   By: Norva PavlovElizabeth  Brown M.D.   On: 08/18/2016 15:30   Dg Chest Port 1v Same Day  Result Date: 08/19/2016 CLINICAL DATA:  PICC line placement EXAM: PORTABLE CHEST 1 VIEW COMPARISON:  08/19/2016 FINDINGS: Left chest wall pacer device is noted with lead in the right atrial appendage and right ventricle. Normal heart size. Aortic atherosclerosis noted. The right arm PICC line tip is at the cavoatrial junction. Patchy infiltrate within the right base is unchanged from previous exam. Left lung is clear. IMPRESSION: 1. Right arm PICC line tip is at the cavoatrial junction. 2. Persistent right base infiltrate. Electronically Signed   By: Signa Kellaylor  Stroud M.D.   On: 08/19/2016 12:02   Dg Abd 2 Views  Result Date: 08/18/2016 CLINICAL DATA:  Nausea and vomiting. History of gastritis and hypertension. EXAM: ABDOMEN - 2 VIEW COMPARISON:  05/04/2016 FINDINGS: Two supine views. The more cephalad imaged excludes the  far left-sided abdomen. Nasogastric tube has been removed. Moderate-to-marked gaseous distension of the stomach. No gross free intraperitoneal air. No bowel distension. Osteopenia. Right hip osteoarthritis. IMPRESSION: Gaseous distension of the stomach, nonspecific.  CT pending. Electronically Signed   By: Jeronimo Greaves M.D.   On: 08/18/2016 16:57  [2 weeks]   Assessment: 83 year old male admitted with multiple medical issues, with GI consulted due to coffee-ground emesis. Likely secondary to  esophagitis. 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 Initially not a candidate for EGD/conscious sedation due to critical illness; however, consider egd prior to discharge. Palliative care consulted. Sepsis secondary to UTI/PNA. No ostomy output documented. 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 However, he is at risk due to multiple health deficits. Flagyl IV started by attending.   Plan: 1. Consider EGD prior to discharge (?tomorrow) to rule out partial gastric outlet obstruction due to duodenal stricture and to evaluate chronic ugi symptoms, weight loss.  2. Continue PPI.  3. Will continue to follow with you.   Leanna Battles. [facility] [phone] 3/8/201810:20 AM       LOS: 3 days

## 2016-08-22 NOTE — Care Management Note (Signed)
Case Management Note  Patient Details  Name: Jon CutterWilliam P [name] MRN: [id number] Date of Birth: November 19, 1931  Subjective/Objective:  Patient adm with UTI/PNA sepsis. He lives with his son, who cares for him. [facility]. He has been bedbound for 2 years. He has WC, hospital bed, and hoyer lift. He has been weaned to 2L oxygen, no home oxygen.                   Action/Plan: Will need HH resumption orders. CM will follow for other needs.    Expected Discharge Date:  08/22/16               Expected Discharge Plan:  Home w Home Health Services  In-House Referral:  Hospice / Palliative Care  Discharge planning Services  CM Consult  Post Acute Care Choice:  Home Health, Resumption of Svcs/PTA Provider Choice offered to:  Patient, Adult Children  DME Arranged:    DME Agency:     HH Arranged:  RN, Disease Management, Nurse's Aide HH Agency:  [facility]  Status of Service:  In process, will continue to follow  If discussed at Long Length of Stay Meetings, dates discussed:    Additional Comments:  Decklan Mau, [name], RN 08/22/2016, 12:01 PM

## 2016-08-22 NOTE — Care Management Note (Signed)
Case Management Note  Patient Details  Name: Jon CutterWilliam P [name] MRN: [id number] Date of Birth: May 30, 1932  Subjective/Objective:    Patient adm with UTI/PNA sepsis. He lives with son, who provides care. Patient has been bedbound for 2 years, [facility] He has WC, hospital b               Action/Plan:   Expected Discharge Date:  08/22/16               Expected Discharge Plan:  Home w Home Health Services  In-House Referral:  Hospice / Palliative Care  Discharge planning Services  CM Consult  Post Acute Care Choice:  Home Health, Resumption of Svcs/PTA Provider Choice offered to:  Patient, Adult Children  DME Arranged:    DME Agency:     HH Arranged:  RN, Disease Management, Nurse's Aide HH Agency:  [facility]  Status of Service:  In process, will continue to follow  If discussed at Long Length of Stay Meetings, dates discussed:    Additional Comments:  [name], [name], RN 08/22/2016, 11:59 AM

## 2016-08-22 NOTE — Progress Notes (Signed)
Modified Barium Swallow Progress Note  Patient Details  Name: Jon [name] MRN: [id number] Date of Birth: Feb 18, 1932  Today's Date: 08/22/2016  Modified Barium Swallow completed.  Full report located under Chart Review in the Imaging Section.  Brief recommendations include the following:  Clinical Impression  Pt assessed in the lateral position with barium tinged thin, puree, regular textures, barium tablet with thin, and nectar-thick liquids. Pt presents with moderate oropharyngeal phase dysphagia characterized by delayed oral transit, impaired lingual movement, delayed AP transit, piecemeal deglutition, delay in swallow initiation with swallow trigger after spilling to pyriforms across all liquids and mech soft textures, decreased tongue base retraction, decreased epiglottic deflection, and decreased laryngeal vestibule closure resulting in significant vallecular and pyriform residue after the swallow. Pt with penetration of thin liquids from vallecular residue trickling down into laryngeal vestibule after the swallow. Pt does produce throat clear when liquids reach vocal folds (not always completely cleared from laryngeal vestibule, but does clear from vocal folds). Pt also demonstrated penetration of thin liquids before the swallow and trace aspiration after the swallow when taking straw sips of thin liquid with barium tablet (this appeared to be removed, however Pt's shoulders limit viewing). Pt is at moderate to mod/severe risk for aspiration given the amount of vallecular and pyriform residue post swallow across consistencies and textures (primarily due to weakness of oropharyngeal structures). Cued cough and cues to repeat/dry swallow were effective in reducing pharyngeal residue, however not completely eliminated. Pt would also be at risk for aspiration of his saliva/secretions. Recommend puree vs D2/chopped (per GI) and thin liquids with strict aspiration and reflux precautions: Pt to sit  fully upright for all eating/drinking, swallow 2-3x for each bite/sip, cough/clear throat every few bites/sips and repeat swallow. Suspect that Pt will likely cough and present with wet vocal quality after meals due to residuals in pharynx. He will need cues to "keep swallowing". Pt may have local inflammation near pyriforms (difficult to visualize) and pt reports sore throat from NG recently removed. Also noted likely osteophyte protruding into pharyngeal space near C5-6, however this did not lead to aspiration. SLP to follow during acute setting. Recommend follow up dysphagia therapy to focus on strengthening oropharyngeal structures if Pt willing/able to particpate.   Swallow Evaluation Recommendations       SLP Diet Recommendations: Dysphagia 2 (Fine chop) solids;Thin liquid;Dysphagia 1 (Puree) solids   Liquid Administration via: Cup;Straw   Medication Administration: Whole meds with puree (or crushed with puree)   Supervision: Patient able to self feed;Full supervision/cueing for compensatory strategies;Staff to assist with self feeding   Compensations: Slow rate;Small sips/bites;Multiple dry swallows after each bite/sip;Clear throat intermittently;Effortful swallow (cough periodically)   Postural Changes: Remain semi-upright after after feeds/meals (Comment);Seated upright at 90 degrees   Oral Care Recommendations: Oral care BID;Staff/trained caregiver to provide oral care   Other Recommendations: Clarify dietary restrictions   Thank you,  Havery MorosDabney Jalynne Persico, CCC-SLP [phone]  Dezhane Staten 08/22/2016,7:33 PM

## 2016-08-23 DIAGNOSIS — D72829 Elevated white blood cell count, unspecified: Secondary | ICD-10-CM

## 2016-08-23 DIAGNOSIS — Z7189 Other specified counseling: Secondary | ICD-10-CM

## 2016-08-23 DIAGNOSIS — I959 Hypotension, unspecified: Secondary | ICD-10-CM

## 2016-08-23 DIAGNOSIS — A419 Sepsis, unspecified organism: Principal | ICD-10-CM

## 2016-08-23 DIAGNOSIS — I4891 Unspecified atrial fibrillation: Secondary | ICD-10-CM

## 2016-08-23 DIAGNOSIS — R6521 Severe sepsis with septic shock: Secondary | ICD-10-CM

## 2016-08-23 LAB — CBC
HCT: 27.5 % — ABNORMAL LOW (ref 39.0–52.0)
Hemoglobin: 9.1 g/dL — ABNORMAL LOW (ref 13.0–17.0)
MCH: 28.5 pg (ref 26.0–34.0)
MCHC: 33.1 g/dL (ref 30.0–36.0)
MCV: 86.2 fL (ref 78.0–100.0)
PLATELETS: 124 10*3/uL — AB (ref 150–400)
RBC: 3.19 MIL/uL — AB (ref 4.22–5.81)
RDW: 16.7 % — ABNORMAL HIGH (ref 11.5–15.5)
WBC: 10.3 10*3/uL (ref 4.0–10.5)

## 2016-08-23 LAB — CULTURE, BLOOD (ROUTINE X 2)
CULTURE: NO GROWTH
Culture: NO GROWTH

## 2016-08-23 LAB — BASIC METABOLIC PANEL
Anion gap: 6 (ref 5–15)
BUN: 14 mg/dL (ref 6–20)
CALCIUM: 8 mg/dL — AB (ref 8.9–10.3)
CO2: 21 mmol/L — ABNORMAL LOW (ref 22–32)
CREATININE: 0.69 mg/dL (ref 0.61–1.24)
Chloride: 112 mmol/L — ABNORMAL HIGH (ref 101–111)
GFR calc non Af Amer: >60 mL/min (ref >60)
Glucose, Bld: 142 mg/dL — ABNORMAL HIGH (ref 65–99)
Potassium: 4.6 mmol/L (ref 3.5–5.1)
SODIUM: 139 mmol/L (ref 135–145)

## 2016-08-23 LAB — GLUCOSE, CAPILLARY
GLUCOSE-CAPILLARY: 114 mg/dL — AB (ref 65–99)
Glucose-Capillary: 129 mg/dL — ABNORMAL HIGH (ref 65–99)

## 2016-08-23 MED ORDER — POLYETHYLENE GLYCOL 3350 17 G PO PACK
17.0000 g | PACK | Freq: Every day | ORAL | Status: DC
  Administered 2016-08-23: 17 g via ORAL
  Filled 2016-08-23 (×2): qty 1

## 2016-08-23 MED ORDER — LINACLOTIDE 72 MCG PO CAPS
72.0000 ug | ORAL_CAPSULE | Freq: Every day | ORAL | Status: DC
  Administered 2016-08-24: 72 ug via ORAL
  Filled 2016-08-23 (×4): qty 1

## 2016-08-23 MED ORDER — LINACLOTIDE 72 MCG PO CAPS: 72.0000 ug | ORAL_CAPSULE | Freq: Every day | ORAL | Status: DC

## 2016-08-23 MED ORDER — SOTALOL HCL 80 MG PO TABS
160.0000 mg | ORAL_TABLET | Freq: Two times a day (BID) | ORAL | Status: DC
  Administered 2016-08-23 – 2016-08-25 (×5): 160 mg via ORAL
  Filled 2016-08-23 (×7): qty 2

## 2016-08-23 MED ORDER — ENSURE ENLIVE PO LIQD
237.0000 mL | Freq: Two times a day (BID) | ORAL | Status: DC
  Administered 2016-08-23 – 2016-08-25 (×3): 237 mL via ORAL

## 2016-08-23 NOTE — Progress Notes (Signed)
Subjective: Feeling better today. Says he has chronic AFib. No abdominal pain, N/V. No more rectal bleeding noted. Complaining of hard stools out of his ostomy. Doesn't like nectar-thick liquids, likes ensure. No other GI complaints.  Objective: Vital signs in last 24 hours: Temp:  [98.1 F (36.7 C)-98.7 F (37.1 C)] 98.7 F (37.1 C) (03/09 0400) Pulse Rate:  [96-132] 128 (03/09 0700) Resp:  [17-27] 22 (03/09 0700) BP: (91-140)/(72-105) 132/88 (03/09 0700) SpO2:  [97 %-100 %] 100 % (03/09 1000) Last BM Date: 08/22/16 General:   Alert and oriented, pleasant Head:  Normocephalic and atraumatic. Eyes:  No icterus, sclera clear. Conjuctiva pink.  Heart:  Irregularly irregular, tachycardia 120-135 during visit, no murmurs noted.  Lungs: Clear to auscultation bilaterally, without wheezing, rales, or rhonchi. On 2 lpm Wilkin Abdomen:  Bowel sounds present, soft, non-tender, non-distended. No rebound or guarding. Ostomy bag noted with hard balls of stool in the bag.  Msk:  Symmetrical without gross deformities. Pulses:  Normal pulses noted. Extremities:  Without clubbing or edema. Neurologic:  Alert and  oriented x4;  grossly normal neurologically. Psych:  Alert and cooperative. Normal mood and affect.  Intake/Output from previous day: 03/08 0701 - 03/09 0700 In: 3042.5 [P.O.:480; I.V.:2400; IV Piggyback:162.5] Out: 1400 [Urine:1400] Intake/Output this shift: No intake/output data recorded.  Lab Results:  Recent Labs  08/21/16 0414 08/22/16 0419 08/23/16 0402  WBC 18.5* 11.5* 10.3  HGB 9.6* 9.6* 9.1*  HCT 28.5* 29.1* 27.5*  PLT 150 120* 124*   BMET  Recent Labs  08/21/16 0414 08/22/16 0419 08/23/16 0402  NA 137 138 139  K 3.7 4.7 4.6  CL 104 109 112*  CO2 27 24 21*  GLUCOSE 92 90 142*  BUN 26* 18 14  CREATININE 0.86 0.91 0.69  CALCIUM 8.0* 8.1* 8.0*   LFT No results for input(s): PROT, ALBUMIN, AST, ALT, ALKPHOS, BILITOT, BILIDIR, IBILI in the last 72  hours. PT/INR  Recent Labs  08/21/16 0414  LABPROT 17.9*  INR 1.46   Hepatitis Panel No results for input(s): HEPBSAG, HCVAB, HEPAIGM, HEPBIGM in the last 72 hours.   Studies/Results: Dg Swallowing Func-speech Pathology  Result Date: 08/22/2016 Objective Swallowing Evaluation: Type of Study: MBS-Modified Barium Swallow Study Patient Details Name: Jon [name] MRN: [id number] Date of Birth: Oct 23, 1931 Today's Date: 08/22/2016 Time: SLP Start Time (ACUTE ONLY): 1100-SLP Stop Time (ACUTE ONLY): 1145 SLP Time Calculation (min) (ACUTE ONLY): 45 min Past Medical History: Past Medical History: Diagnosis Date . Abnormality of gait  . Arthritis  . B12 deficiency  . Bilateral hydronephrosis  . Bladder calculi  . Bleeding ulcer  . Dysrhythmia  . Essential hypertension  . Essential tremor  . Gastritis  . Macular degeneration of left eye  . Memory loss  . Multiple rib fractures  . Neuropathy (HCC)  . OSA (obstructive sleep apnea)   Does not use CPAP . PAF (paroxysmal atrial fibrillation) (HCC)  . Presence of permanent cardiac pacemaker  . Tachycardia-bradycardia syndrome (HCC)   [employer] PPM - Dr. Royann Shivers . Varicose veins   Bilateral Past Surgical History: Past Surgical History: Procedure Laterality Date . BRAIN SURGERY   . CATARACT EXTRACTION Bilateral 2013 . COLON SURGERY   . COLOSTOMY N/A 07/11/2014  Procedure: DIVERTING COLOSTOMY;  Surgeon: Dalia Heading, MD;  Location: [facility];  Service: General;  Laterality: N/A;  wound class: clean contaminated . ESOPHAGOGASTRODUODENOSCOPY N/A 05/16/2015  Dr. Darrick Penna: few linear erosions/ulcerastions in distal esophagus, non-erosive gastritis, no duodenal abnormalities. Path with  chronic gastritis, negative H.pylori.  . INCISION AND DRAINAGE OF WOUND N/A 07/11/2014  Procedure: DEBRIDEMENT OF SACRUM;  Surgeon: Dalia Heading, MD;  Location: [facility];  Service: General;  Laterality: N/A; . INSERT / REPLACE / REMOVE PACEMAKER   . MENINGIOMA REMOVAL  1992  POST RT FRONTAL  .  PACEMAKER INSERTION  2006 . PERMANENT PACEMAKER GENERATOR CHANGE N/A 08/13/2012  Procedure: PERMANENT PACEMAKER GENERATOR CHANGE;  Surgeon: Thurmon Fair, MD;  Location: [facility];  Service: Cardiovascular;  Laterality: N/A; HPI: 83 y.o. male, with a past medical history significant for PAF On warfarin , tachybradycardia syndrome status post pacemaker, chronic diastolic CHF, neuropathy, OSA, HTN, anxiety, s/p diverting colostomy & indwelling Foley due to Sacral Ulcer, and ambulatory at bedside, bedbound, patient was brought to the hospital due to several episodes of coffee-ground emesis, patient with known colostomy has not been draining much for few days, CT abdomen pelvis with markedly distended stomach possible gastric outlet obstruction versus severe gastroparesis, as well multiple stones in the urinary bladder possible cystitis, patient was found to have septic shock, required to ICU and started on pressors. Pt had NG placed (now removed for suction). BSE ordered. Pt with RLL PNA likely from emesis and possible aspiration prior to admission.  Subjective: "ok" Assessment / Plan / Recommendation CHL IP CLINICAL IMPRESSIONS 08/22/2016 Clinical Impression Pt assessed in the lateral position with barium tinged thin, puree, regular textures, barium tablet with thin, and nectar-thick liquids. Pt presents with moderate oropharyngeal phase dysphagia characterized by delayed oral transit, impaired lingual movement, delayed AP transit, piecemeal deglutition, delay in swallow initiation with swallow trigger after spilling to pyriforms across all liquids and mech soft textures, decreased tongue base retraction, decreased epiglottic deflection, and decreased laryngeal vestibule closure resulting in significant vallecular and pyriform residue after the swallow. Pt with penetration of thin liquids from vallecular residue trickling down into laryngeal vestibule after the swallow. Pt does produce throat clear when liquids reach  vocal folds (not always completely cleared from laryngeal vestibule, but does clear from vocal folds). Pt also demonstrated penetration of thin liquids before the swallow and trace aspiration after the swallow when taking straw sips of thin liquid with barium tablet (this appeared to be removed, however Pt's shoulders limit viewing). Pt is at moderate to mod/severe risk for aspiration given the amount of vallecular and pyriform residue post swallow across consistencies and textures (primarily due to weakness of oropharyngeal structures). Cued cough and cues to repeat/dry swallow were effective in reducing pharyngeal residue, however not completely eliminated. Pt would also be at risk for aspiration of his saliva/secretions. Recommend puree vs D2/chopped (per GI) and thin liquids with strict aspiration and reflux precautions: Pt to sit fully upright for all eating/drinking, swallow 2-3x for each bite/sip, cough/clear throat every few bites/sips and repeat swallow. Suspect that Pt will likely cough and present with wet vocal quality after meals due to residuals in pharynx. He will need cues to "keep swallowing". Pt may have local inflammation near pyriforms (difficult to visualize) and pt reports sore throat from NG recently removed. Also noted likely osteophyte protruding into pharyngeal space near C5-6, however this did not lead to aspiration. SLP to follow during acute setting. Recommend follow up dysphagia therapy to focus on strengthening oropharyngeal structures if Pt willing/able to particpate. SLP Visit Diagnosis Dysphagia, oropharyngeal phase (R13.12) Attention and concentration deficit following -- Frontal lobe and executive function deficit following -- Impact on safety and function Moderate aspiration risk   CHL IP TREATMENT  RECOMMENDATION 08/22/2016 Treatment Recommendations Therapy as outlined in treatment plan below   Prognosis 08/22/2016 Prognosis for Safe Diet Advancement Fair Barriers to Reach Goals  Severity of deficits Barriers/Prognosis Comment -- CHL IP DIET RECOMMENDATION 08/22/2016 SLP Diet Recommendations Dysphagia 2 (Fine chop) solids;Thin liquid;Dysphagia 1 (Puree) solids Liquid Administration via Cup;Straw Medication Administration Whole meds with puree Compensations Slow rate;Small sips/bites;Multiple dry swallows after each bite/sip;Clear throat intermittently;Effortful swallow Postural Changes Remain semi-upright after after feeds/meals (Comment);Seated upright at 90 degrees   CHL IP OTHER RECOMMENDATIONS 08/22/2016 Recommended Consults -- Oral Care Recommendations Oral care BID;Staff/trained caregiver to provide oral care Other Recommendations Clarify dietary restrictions   CH[facility] IP FOLLOW UP RECOMMENDATIONS 08/22/2016 [facility] D[facility] 08/22/2016 Speech Therapy Frequency (ACUTE ONLY) min 2x/week Treatment Duration 1 week      CHL IP ORAL PHASE 08/22/2016 Oral Phase Impaired Oral - Pudding Teaspoon -- Oral - Pudding Cup -- Oral - Honey Teaspoon -- Oral - Honey Cup -- Oral - Nectar Teaspoon -- Oral - Nectar Cup -- Oral - Nectar Straw -- Oral - Thin Teaspoon -- Oral - Thin Cup -- Oral - Thin Straw -- Oral - Puree -- Oral - Mech Soft -- Oral - Regular Weak lingual manipulation;Impaired mastication;Piecemeal swallowing;Delayed oral transit Oral - Multi-Consistency -- Oral - Pill -- Oral Phase - Comment --  CHL IP PHARYNGEAL PHASE 08/22/2016 Pharyngeal Phase Impaired Pharyngeal- Pudding Teaspoon -- Pharyngeal -- Pharyngeal- Pudding Cup -- Pharyngeal -- Pharyngeal- Honey Teaspoon -- Pharyngeal -- Pharyngeal- Honey Cup -- Pharyngeal -- Pharyngeal- Nectar Teaspoon -- Pharyngeal -- Pharyngeal- Nectar Cup -- Pharyngeal -- Pharyngeal- Nectar Straw Delayed swallow initiation-vallecula;Reduced epiglottic inversion;Reduced tongue base retraction;Penetration/Apiration after swallow;Pharyngeal residue - valleculae;Pharyngeal residue - pyriform;Lateral channel  residue Pharyngeal Material enters airway, remains ABOVE vocal cords then ejected out;Material does not enter airway Pharyngeal- Thin Teaspoon -- Pharyngeal -- Pharyngeal- Thin Cup Delayed swallow initiation-vallecula;Delayed swallow initiation-pyriform sinuses;Reduced epiglottic inversion;Reduced tongue base retraction;Penetration/Aspiration during swallow;Pharyngeal residue - valleculae;Pharyngeal residue - pyriform;Lateral channel residue Pharyngeal Material does not enter airway;Material enters airway, remains ABOVE vocal cords then ejected out;Material enters airway, remains ABOVE vocal cords and not ejected out Pharyngeal- Thin Straw Delayed swallow initiation-vallecula;Delayed swallow initiation-pyriform sinuses;Reduced epiglottic inversion;Reduced tongue base retraction;Penetration/Aspiration during swallow;Pharyngeal residue - valleculae;Pharyngeal residue - pyriform;Lateral channel residue Pharyngeal Material enters airway, remains ABOVE vocal cords then ejected out;Material does not enter airway Pharyngeal- Puree Delayed swallow initiation-vallecula;Reduced epiglottic inversion;Reduced tongue base retraction;Pharyngeal residue - valleculae;Pharyngeal residue - pyriform Pharyngeal -- Pharyngeal- Mechanical Soft -- Pharyngeal -- Pharyngeal- Regular Delayed swallow initiation-pyriform sinuses;Reduced epiglottic inversion;Reduced tongue base retraction;Pharyngeal residue - valleculae;Pharyngeal residue - pyriform Pharyngeal -- Pharyngeal- Multi-consistency -- Pharyngeal -- Pharyngeal- Pill Delayed swallow initiation-vallecula;Reduced epiglottic inversion;Reduced airway/laryngeal closure;Reduced tongue base retraction;Penetration/Aspiration before swallow;Penetration/Apiration after swallow;Trace aspiration;Pharyngeal residue - valleculae;Pharyngeal residue - pyriform;Lateral channel residue Pharyngeal Material enters airway, passes BELOW cords then ejected out Pharyngeal Comment --  CHL IP CERVICAL ESOPHAGEAL  PHASE 08/22/2016 Cervical Esophageal Phase (No Data) Pudding Teaspoon -- Pudding Cup -- Honey Teaspoon -- Honey Cup -- Nectar Teaspoon -- Nectar Cup -- Nectar Straw -- Thin Teaspoon -- Thin Cup -- Thin Straw -- Puree -- Mechanical Soft -- Regular -- Multi-consistency -- Pill -- Cervical Esophageal Comment -- No flowsheet data found. Thank you, Havery MorosDabney Porter, CCC-SLP [phone] PORTER,DABNEY 08/22/2016, 7:56 PM               Assessment: 83 year old male admitted with multiple medical issues, with GI consulted due to coffee-ground emesis.  Likely secondary to esophagitis. 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 Initially not a candidate for EGD/conscious sedation due to critical illness; however, consider egd prior to discharge. Palliative care consulted. Sepsis secondary to UTI/aspiration PNA. No ostomy output documented initially. 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 However, he is at risk due to multiple health deficits. Flagyl IV started by attending.   Today he has noted ostomy output of hard balls of stool likely constipatiion. Off pressors, remains in AFib with RVR HR 120-135 recently. No further GI bleed per hematemesis/coffee ground emesis  noted by patient, family, or staff. No hematochezia/melena. No black or red stools noted in ostomy bag during exam. Not an endoscopic candidate at this time given hemodynamic issues with tachycardia. Breathing seems improved. CBC appears stable (9.1 today from 9.6 the past 2 days).  Plan: 1. Continue to monitor for recurrent GI bleed. 2. Follow H/H 3. May need IP EGD before d/c when patient is more stable 4. Agree with palliative care ongoing discussions 5. Will add Miralax to help with stool softening 6. Supportive measures   Thank you for allowing [place] to participate in the care of Jon Felling, DNP, AGNP-C Adult & Gerontological Nurse Practitioner [facility]     LOS: 4 days    08/23/2016, 10:01 AM

## 2016-08-23 NOTE — Progress Notes (Signed)
PROGRESS NOTE                                                                                                                                                                                                             Patient Demographics:    Jon [name], is a 83 y.o. male, DOB - 1931/12/11, ZOX:[id number]  Admit date - 08/18/2016   Admitting Physician Meredeth Ide, MD  Outpatient Primary MD for the patient is Milana Obey, MD   Outpatient Specialists: Cardiology Dr. Loney Hering, GI Dr Darrick Penna.  Chief Complaint  Patient presents with  . Emesis       Brief Narrative   83 y.o. male, with a past medical history significant for PAF On warfarin , tachybradycardia syndrome status post pacemaker, chronic diastolic CHF, neuropathy, OSA, HTN, anxiety, s/p diverting colostomy & indwelling Foley due to Sacral Ulcer, and ambulatory at bedside, bedbound, patient was brought to the hospital due to several episodes of coffee-ground emesis, patient with known colostomy has not been draining much for few days, CT abdomen pelvis with markedly distended stomach possible gastric outlet obstruction versus severe gastroparesis, as well multiple stones in the urinary bladder possible cystitis, patient was found to have septic shock, required to ICU and started on pressors.   Subjective:    Jon [name] today has, No headache, No chest pain, No abdominal pain or Shortness of breath, report generalized weakness and fatigue.   Assessment  & Plan :    Active Problems:   Hypotension   UGI bleed   Pressure injury of skin   Gastric distention   Heme positive stool   Septic shock (HCC)   Goals of care, counseling/discussion   DNR (do not resuscitate) discussion   Palliative care encounter   N&V (nausea and vomiting)   Leukocytosis   Septic shock - Hypotension improved, Has been weaned off pressors. - Follow on blood cultures, so far no growth on day 5. -  Chest x-ray with evidence of right lower lobe pneumonia, most likely aspiration pneumonia, already on vac and Zosyn. Continue Vanc as MRSA PCR is positive. - Sacral  pressure ulcer appears to be healing nicely with no evidence of infection - Sepsis-most likely related to urinary source and aspiration PNA, follow on urine cultures, foley catheter has been changed. -significant improvement overnight in leukocytosis, now down  to 10.3 from a high of 34.4.  Gastric outlet obstruction - NGT removed. No N/V. -Appreciate GI input: to consider EGD once no longer on pressors. Patient most likely has gastroparesis. EGD in 11/16 without pyloric stenosis. -Has been started on a dysphagia 1 diet with nectar thick liquids per speech therapy recommendations. -Per GI, no EGD this admission.  Acute hypoxic respiratory failure -Likely due to aspiration PNA, -Improved, continue to wean oxygen as tolerated.  Aspiration pneumonia - Continue with vancomycin and Zosyn (continue vancomycin due to positive MRSA PCR). -Will likely transition to PO abx in am.  Chronic diastolic CHF - Recent echo on 19/1478 with EF 65%, grade 2 diastolic dysfunction. -No signs of volume overload at present.  Atrial fibrillation/tachybradycardia syndrome - Patient with known history of A. Fib, on warfarin supratherapeutic INR on presentation of 3.14. - Restart sotalol as HR now in the 120-130s and BP can tolerate.  History of hypertension - BP improved; now off pressors; resume sotalol given elevated HRs.  Coagulopathy - Both therapeutic INR on admission 3.1 ,received  vitamin K and FFP, INR 1.46 on 3/7.  Hypokalemia - Replaced.  Protein calorie malnutrition -Start diet pending ST recs.  Pressure ulcer - Appears to be healing nicely, almost closed no discharge or evidence of infection on physical exam  Hyperglycemia - Check hemoglobin A1c, CBG every 6 time X4  Code Status : Full  Family Communication  : Son at  bedside  Disposition Plan  : remains in ICU, try to wean pressors today.  Consults  :  GI  Procedures  : None  DVT Prophylaxis  :   SCDs   Lab Results  Component Value Date   PLT 124 (L) 08/23/2016    Antibiotics  :    Anti-infectives    Start     Dose/Rate Route Frequency Ordered Stop   08/20/16 1100  metroNIDAZOLE (FLAGYL) IVPB 500 mg  Status:  Discontinued     500 mg 100 mL/hr over 60 Minutes Intravenous Every 8 hours 08/20/16 1052 08/21/16 1159   08/20/16 0745  vancomycin (VANCOCIN) 500 mg in sodium chloride irrigation 0.9 % 100 mL ENEMA  Status:  Discontinued     500 mg Rectal Every 6 hours 08/20/16 0739 08/20/16 0739   08/19/16 0500  vancomycin (VANCOCIN) IVPB 1000 mg/200 mL premix     1,000 mg 200 mL/hr over 60 Minutes Intravenous Every 24 hours 08/18/16 1845     08/18/16 2300  piperacillin-tazobactam (ZOSYN) IVPB 3.375 g     3.375 g 12.5 mL/hr over 240 Minutes Intravenous Every 8 hours 08/18/16 1845     08/18/16 1700  piperacillin-tazobactam (ZOSYN) IVPB 3.375 g     3.375 g 100 mL/hr over 30 Minutes Intravenous  Once 08/18/16 1652 08/18/16 1729   08/18/16 1700  vancomycin (VANCOCIN) IVPB 1000 mg/200 mL premix     1,000 mg 200 mL/hr over 60 Minutes Intravenous  Once 08/18/16 1652 08/18/16 1833        Objective:   Vitals:   08/23/16 1200 08/23/16 1300 08/23/16 1500 08/23/16 1540  BP: 118/88 120/87 110/87   Pulse: (!) 116  (!) 119 (!) 124  Resp: (!) 21 20 (!) 23   Temp:      TempSrc:      SpO2: 100%  100%   Weight:      Height:        Wt Readings from Last 3 Encounters:  08/22/16 61.3 kg (135 lb 2.3 oz)  05/11/16 62 kg (  136 lb 11 oz)  05/05/16 68.2 kg (150 lb 5.7 oz)     Intake/Output Summary (Last 24 hours) at 08/23/16 1744 Last data filed at 08/23/16 1531  Gross per 24 hour  Intake          2414.17 ml  Output             1400 ml  Net          1014.17 ml     Physical Exam  Awake Alert, Oriented X 3, Frail, chronically ill-appearing  Disheveled, long nails Supple Neck,No JVD, .  Symmetrical Chest wall movement, diminished air movement bilaterally, no wheezing Irreg,No Gallops,Rubs or new Murmurs, No Parasternal Heave +ve B.Sounds, Abd Soft, No tenderness, No rebound - guarding or rigidity, has colostomy bag which is empty Long nails, poor skin condition, hyperkeratosis, saccular pressure ulcer which is unstageable as it is healing and closed, no evidence of discharge or infection    Data Review:    CBC  Recent Labs Lab 08/18/16 1521  08/19/16 1751 08/20/16 0415 08/21/16 0414 08/22/16 0419 08/23/16 0402  WBC 23.2*  < > 34.4* 31.7* 18.5* 11.5* 10.3  HGB 15.8  < > 11.1* 10.7* 9.6* 9.6* 9.1*  HCT 45.9  < > 32.4* 31.3* 28.5* 29.1* 27.5*  PLT 379  < > 209 226 150 120* 124*  MCV 84.1  < > 84.4 84.8 85.3 85.8 86.2  MCH 28.9  < > 28.9 29.0 28.7 28.3 28.5  MCHC 34.4  < > 34.3 34.2 33.7 33.0 33.1  RDW 15.6*  < > 15.9* 16.1* 16.2* 16.5* 16.7*  LYMPHSABS 0.9  --   --   --   --   --   --   MONOABS 0.9  --   --   --   --   --   --   EOSABS 0.0  --   --   --   --   --   --   BASOSABS 0.0  --   --   --   --   --   --   < > = values in this interval not displayed.  Chemistries   Recent Labs Lab 08/18/16 1521 08/19/16 0317 08/20/16 0415 08/21/16 0414 08/22/16 0419 08/23/16 0402  NA 136 139 135 137 138 139  K 4.9 4.2 3.0* 3.7 4.7 4.6  CL 88* 93* 96* 104 109 112*  CO2 36* 32 28 27 24  21*  GLUCOSE 201* 173* 117* 92 90 142*  BUN 31* 32* 40* 26* 18 14  CREATININE 1.34* 1.41* 1.23 0.86 0.91 0.69  CALCIUM 9.6 9.3 8.2* 8.0* 8.1* 8.0*  MG  --   --   --  1.5*  --   --   AST 27 25  --   --   --   --   ALT 11* 12*  --   --   --   --   ALKPHOS 74 61  --   --   --   --   BILITOT 0.9 0.8  --   --   --   --    ------------------------------------------------------------------------------------------------------------------ No results for input(s): CHOL, HDL, LDLCALC, TRIG, CHOLHDL, LDLDIRECT in the last 72 hours.  Lab  Results  Component Value Date   HGBA1C 5.3 08/20/2016   ------------------------------------------------------------------------------------------------------------------ No results for input(s): TSH, T4TOTAL, T3FREE, THYROIDAB in the last 72 hours.  Invalid input(s): FREET3 ------------------------------------------------------------------------------------------------------------------ No results for input(s): VITAMINB12, FOLATE, FERRITIN, TIBC, IRON, RETICCTPCT in the last 72  hours.  Coagulation profile  Recent Labs Lab 08/18/16 1521 08/19/16 0317 08/21/16 0414  INR 3.14 3.00 1.46    No results for input(s): DDIMER in the last 72 hours.  Cardiac Enzymes No results for input(s): CKMB, TROPONINI, MYOGLOBIN in the last 168 hours.  Invalid input(s): CK ------------------------------------------------------------------------------------------------------------------ No results found for: BNP  Inpatient Medications  Scheduled Meds: . Chlorhexidine Gluconate Cloth  6 each Topical Q0600  . feeding supplement (ENSURE ENLIVE)  237 mL Oral BID BM  . linaclotide  72 mcg Oral QAC breakfast  . mupirocin ointment  1 application Nasal BID  . pantoprazole (PROTONIX) IV  40 mg Intravenous Q12H  . piperacillin-tazobactam (ZOSYN)  IV  3.375 g Intravenous Q8H  . polyethylene glycol  17 g Oral Daily  . sotalol  160 mg Oral Q12H  . vancomycin  1,000 mg Intravenous Q24H   Continuous Infusions: . 0.9 % NaCl with KCl 40 mEq / L 100 mL/hr (08/23/16 1531)  . norepinephrine (LEVOPHED) Adult infusion Stopped (08/22/16 0000)   PRN Meds:.acetaminophen **OR** acetaminophen, promethazine, RESOURCE THICKENUP CLEAR  Micro Results Recent Results (from the past 240 hour(s))  Blood Culture (routine x 2)     Status: None   Collection Time: 08/18/16  3:23 PM  Result Value Ref Range Status   Specimen Description LEFT ANTECUBITAL  Final   Special Requests BOTTLES DRAWN AEROBIC AND ANAEROBIC 6CC EACH   Final   Culture NO GROWTH 5 DAYS  Final   Report Status 08/23/2016 FINAL  Final  Blood Culture (routine x 2)     Status: None   Collection Time: 08/18/16  3:53 PM  Result Value Ref Range Status   Specimen Description LEFT ANTECUBITAL  Final   Special Requests BOTTLES DRAWN AEROBIC AND ANAEROBIC 8CC EACH  Final   Culture NO GROWTH 5 DAYS  Final   Report Status 08/23/2016 FINAL  Final  MRSA PCR Screening     Status: Abnormal   Collection Time: 08/18/16  8:19 PM  Result Value Ref Range Status   MRSA by PCR POSITIVE (A) NEGATIVE Final    Comment:        The GeneXpert MRSA Assay (FDA approved for NASAL specimens only), is one component of a comprehensive MRSA colonization surveillance program. It is not intended to diagnose MRSA infection nor to guide or monitor treatment for MRSA infections. RESULT CALLED TO, READ BACK BY AND VERIFIED WITH:  HEARN,J @ 2342 ON 08/18/16 BY JUW   Urine culture     Status: Abnormal   Collection Time: 08/19/16 12:00 PM  Result Value Ref Range Status   Specimen Description URINE, CATHETERIZED  Final   Special Requests NONE  Final   Culture MULTIPLE SPECIES PRESENT, SUGGEST RECOLLECTION (A)  Final   Report Status 08/21/2016 FINAL  Final    Radiology Reports Ct Abdomen Pelvis Wo Contrast  Result Date: 08/18/2016 CLINICAL DATA:  Weakness, vomiting, and hypotension. EXAM: CT ABDOMEN AND PELVIS WITHOUT CONTRAST TECHNIQUE: Multidetector CT imaging of the abdomen and pelvis was performed following the standard protocol without IV contrast. COMPARISON:  CT scans since June 15, 2014 FINDINGS: Lower chest: The small left pleural effusion is decreased in size in the interval. The right-sided pleural effusion has resolved. There is fluid in the distal esophagus. Coronary artery calcifications are noted. Mild dependent atelectasis. No acute abnormalities in the lung bases. Hepatobiliary: Cholelithiasis is identified. The liver is unchanged. Pancreas: Unremarkable.  No pancreatic ductal dilatation or surrounding inflammatory changes. Spleen: Normal in size without  focal abnormality. Adrenals/Urinary Tract: Multiple simple and hyperdense renal cysts are seen, unchanged. The largest is on the right measuring at least 11 cm in transverse dimension. Vascular calcifications are associated with the kidneys. There are also renal stones. The stone in the right renal pelvis is larger in the interval measuring 17 by 9 mm. There is no resulting hydronephrosis but there is mild increased attenuation of fat adjacent to the right renal pelvis. Remainder of the right ureter is normal in caliber. No left-sided stones. The left ureter is normal. There is a Foley catheter in the bladder. Multiple stones are seen primarily dependently in the bladder. There is mild stranding adjacent to the bladder which could be seen with cystitis. This finding is similar in the interval however. Stomach/Bowel: The stomach is markedly distended. This finding is worsened in the interval. 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 The duodenum itself is normal in caliber. The remainder of the small bowel is normal in caliber as well. The patient is status post partial colectomy. There is a left lower quadrant ostomy. The remainder of the colon is unremarkable. Scattered diverticuli without diverticulitis. No evidence of appendicitis. Vascular/Lymphatic: Atherosclerotic change is seen in the non aneurysmal aorta. No adenopathy. Reproductive: Prostate is unremarkable. Other: Mild increased attenuation in the fat of the pericolic gutters is stable as is the mild increased attenuation anterior to the sacrum. Musculoskeletal: No interval bony changes identified. IMPRESSION: 1. Markedly distended stomach, worsened in the interval. This could be seen with gastric outlet obstruction or severe gastroparesis. 2. 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 This is nonspecific but  may represent a gist tumor. 3. Cholelithiasis. 4. Multiple renal cysts. The stone in the right renal pelvis has increased in the interval and there is now mild increased attenuation of fat adjacent to the right renal pelvis which could be due to intermittent obstruction. No hydronephrosis on today's study. 5. Multiple stones in the bladder. Mild fat stranding adjacent to the bladder could be due to cystitis. Recommend correlation with urinalysis to assess for UTI. 6. Atherosclerosis. 7. Diverticulosis. Electronically Signed   By: Gerome Sam III M.D   On: 08/18/2016 17:58   Dg Chest Port 1 View  Result Date: 08/19/2016 CLINICAL DATA:  Hypoxia. EXAM: PORTABLE CHEST 1 VIEW COMPARISON:  08/18/2016 and 05/10/2016 FINDINGS: NG tube tip is in the left upper quadrant the abdomen. Pacemaker in place. There is a patchy infiltrate at the right lung base. Left lung is clear. Heart size and vascularity are normal. Calcification in the arch of the aorta. No acute bone abnormality. IMPRESSION: New patchy infiltrate at the right lung base. Aortic atherosclerosis. Electronically Signed   By: Francene Boyers M.D.   On: 08/19/2016 09:48   Dg Chest Port 1 View  Result Date: 08/18/2016 CLINICAL DATA:  Hypotension and vomiting today.  05/10/2016 EXAM: PORTABLE CHEST 1 VIEW COMPARISON:  None. FINDINGS: Left-sided transvenous pacemaker leads to the right atrium and right ventricle. Heart is mildly enlarged. Aorta is tortuous and partially calcified. Lungs are clear. No pulmonary edema. No free intraperitoneal air beneath the diaphragm. IMPRESSION: Stable cardiomegaly.  No evidence for acute pulmonary abnormality. Electronically Signed   By: Norva Pavlov M.D.   On: 08/18/2016 15:30   Dg Chest Port 1v Same Day  Result Date: 08/19/2016 CLINICAL DATA:  PICC line placement EXAM: PORTABLE CHEST 1 VIEW COMPARISON:  08/19/2016 FINDINGS: Left chest wall pacer device is  noted with lead in the right atrial appendage and right  ventricle. Normal heart size. Aortic atherosclerosis noted. The right arm PICC line tip is at the cavoatrial junction. Patchy infiltrate within the right base is unchanged from previous exam. Left lung is clear. IMPRESSION: 1. Right arm PICC line tip is at the cavoatrial junction. 2. Persistent right base infiltrate. Electronically Signed   By: Signa Kell M.D.   On: 08/19/2016 12:02   Dg Abd 2 Views  Result Date: 08/18/2016 CLINICAL DATA:  Nausea and vomiting. History of gastritis and hypertension. EXAM: ABDOMEN - 2 VIEW COMPARISON:  05/04/2016 FINDINGS: Two supine views. The more cephalad imaged excludes the far left-sided abdomen. Nasogastric tube has been removed. Moderate-to-marked gaseous distension of the stomach. No gross free intraperitoneal air. No bowel distension. Osteopenia. Right hip osteoarthritis. IMPRESSION: Gaseous distension of the stomach, nonspecific.  CT pending. Electronically Signed   By: Jeronimo Greaves M.D.   On: 08/18/2016 16:57   Dg Swallowing Func-speech Pathology  Result Date: 08/22/2016 Objective Swallowing Evaluation: Type of Study: MBS-Modified Barium Swallow Study Patient Details Name: YAKUB LODES MRN: [id number] Date of Birth: 01-06-1932 Today's Date: 08/22/2016 Time: SLP Start Time (ACUTE ONLY): 1100-SLP Stop Time (ACUTE ONLY): 1145 SLP Time Calculation (min) (ACUTE ONLY): 45 min Past Medical History: Past Medical History: Diagnosis Date . Abnormality of gait  . Arthritis  . B12 deficiency  . Bilateral hydronephrosis  . Bladder calculi  . Bleeding ulcer  . Dysrhythmia  . Essential hypertension  . Essential tremor  . Gastritis  . Macular degeneration of left eye  . Memory loss  . Multiple rib fractures  . Neuropathy (HCC)  . OSA (obstructive sleep apnea)   Does not use CPAP . PAF (paroxysmal atrial fibrillation) (HCC)  . Presence of permanent cardiac pacemaker  . Tachycardia-bradycardia syndrome (HCC)   [employer] PPM - Dr. Royann Shivers . Varicose veins   Bilateral Past  Surgical History: Past Surgical History: Procedure Laterality Date . BRAIN SURGERY   . CATARACT EXTRACTION Bilateral 2013 . COLON SURGERY   . COLOSTOMY N/A 07/11/2014  Procedure: DIVERTING COLOSTOMY;  Surgeon: Dalia Heading, MD;  Location: [facility];  Service: General;  Laterality: N/A;  wound class: clean contaminated . ESOPHAGOGASTRODUODENOSCOPY N/A 05/16/2015  Dr. Darrick Penna: few linear erosions/ulcerastions in distal esophagus, non-erosive gastritis, no duodenal abnormalities. Path with chronic gastritis, negative H.pylori.  . INCISION AND DRAINAGE OF WOUND N/A 07/11/2014  Procedure: DEBRIDEMENT OF SACRUM;  Surgeon: Dalia Heading, MD;  Location: [facility];  Service: General;  Laterality: N/A; . INSERT / REPLACE / REMOVE PACEMAKER   . MENINGIOMA REMOVAL  1992  POST RT FRONTAL  . PACEMAKER INSERTION  2006 . PERMANENT PACEMAKER GENERATOR CHANGE N/A 08/13/2012  Procedure: PERMANENT PACEMAKER GENERATOR CHANGE;  Surgeon: Thurmon Fair, MD;  Location: [facility];  Service: Cardiovascular;  Laterality: N/A; HPI: 83 y.o. male, with a past medical history significant for PAF On warfarin , tachybradycardia syndrome status post pacemaker, chronic diastolic CHF, neuropathy, OSA, HTN, anxiety, s/p diverting colostomy & indwelling Foley due to Sacral Ulcer, and ambulatory at bedside, bedbound, patient was brought to the hospital due to several episodes of coffee-ground emesis, patient with known colostomy has not been draining much for few days, CT abdomen pelvis with markedly distended stomach possible gastric outlet obstruction versus severe gastroparesis, as well multiple stones in the urinary bladder possible cystitis, patient was found to have septic shock, required to ICU and started on pressors. Pt had NG placed (now removed for  suction). BSE ordered. Pt with RLL PNA likely from emesis and possible aspiration prior to admission.  Subjective: "ok" Assessment / Plan / Recommendation CHL IP CLINICAL IMPRESSIONS 08/22/2016 Clinical  Impression Pt assessed in the lateral position with barium tinged thin, puree, regular textures, barium tablet with thin, and nectar-thick liquids. Pt presents with moderate oropharyngeal phase dysphagia characterized by delayed oral transit, impaired lingual movement, delayed AP transit, piecemeal deglutition, delay in swallow initiation with swallow trigger after spilling to pyriforms across all liquids and mech soft textures, decreased tongue base retraction, decreased epiglottic deflection, and decreased laryngeal vestibule closure resulting in significant vallecular and pyriform residue after the swallow. Pt with penetration of thin liquids from vallecular residue trickling down into laryngeal vestibule after the swallow. Pt does produce throat clear when liquids reach vocal folds (not always completely cleared from laryngeal vestibule, but does clear from vocal folds). Pt also demonstrated penetration of thin liquids before the swallow and trace aspiration after the swallow when taking straw sips of thin liquid with barium tablet (this appeared to be removed, however Pt's shoulders limit viewing). Pt is at moderate to mod/severe risk for aspiration given the amount of vallecular and pyriform residue post swallow across consistencies and textures (primarily due to weakness of oropharyngeal structures). Cued cough and cues to repeat/dry swallow were effective in reducing pharyngeal residue, however not completely eliminated. Pt would also be at risk for aspiration of his saliva/secretions. Recommend puree vs D2/chopped (per GI) and thin liquids with strict aspiration and reflux precautions: Pt to sit fully upright for all eating/drinking, swallow 2-3x for each bite/sip, cough/clear throat every few bites/sips and repeat swallow. Suspect that Pt will likely cough and present with wet vocal quality after meals due to residuals in pharynx. He will need cues to "keep swallowing". Pt may have local inflammation near  pyriforms (difficult to visualize) and pt reports sore throat from NG recently removed. Also noted likely osteophyte protruding into pharyngeal space near C5-6, however this did not lead to aspiration. SLP to follow during acute setting. Recommend follow up dysphagia therapy to focus on strengthening oropharyngeal structures if Pt willing/able to particpate. SLP Visit Diagnosis Dysphagia, oropharyngeal phase (R13.12) Attention and concentration deficit following -- Frontal lobe and executive function deficit following -- Impact on safety and function Moderate aspiration risk   CHL IP TREATMENT RECOMMENDATION 08/22/2016 Treatment Recommendations Therapy as outlined in treatment plan below   Prognosis 08/22/2016 Prognosis for Safe Diet Advancement Fair Barriers to Reach Goals Severity of deficits Barriers/Prognosis Comment -- CHL IP DIET RECOMMENDATION 08/22/2016 SLP Diet Recommendations Dysphagia 2 (Fine chop) solids;Thin liquid;Dysphagia 1 (Puree) solids Liquid Administration via Cup;Straw Medication Administration Whole meds with puree Compensations Slow rate;Small sips/bites;Multiple dry swallows after each bite/sip;Clear throat intermittently;Effortful swallow Postural Changes Remain semi-upright after after feeds/meals (Comment);Seated upright at 90 degrees   CHL IP OTHER RECOMMENDATIONS 08/22/2016 Recommended Consults -- Oral Care Recommendations Oral care BID;Staff/trained caregiver to provide oral care Other Recommendations Clarify dietary restrictions   CHL IP FOLLOW UP RECOMMENDATIONS 08/22/2016 Follow up [facility]ations [facility] D[facility] 08/22/2016 Speech Therapy Frequency (ACUTE ONLY) min 2x/week Treatment Duration 1 week      CHL IP ORAL PHASE 08/22/2016 Oral Phase Impaired Oral - Pudding Teaspoon -- Oral - Pudding Cup -- Oral - Honey Teaspoon -- Oral - Honey Cup -- Oral - Nectar Teaspoon -- Oral - Nectar Cup -- Oral - Nectar Straw -- Oral - Thin Teaspoon -- Oral - Thin Cup --  Oral - Thin Straw -- Oral - Puree -- Oral - Mech Soft -- Oral - Regular Weak lingual manipulation;Impaired mastication;Piecemeal swallowing;Delayed oral transit Oral - Multi-Consistency -- Oral - Pill -- Oral Phase - Comment --  CHL IP PHARYNGEAL PHASE 08/22/2016 Pharyngeal Phase Impaired Pharyngeal- Pudding Teaspoon -- Pharyngeal -- Pharyngeal- Pudding Cup -- Pharyngeal -- Pharyngeal- Honey Teaspoon -- Pharyngeal -- Pharyngeal- Honey Cup -- Pharyngeal -- Pharyngeal- Nectar Teaspoon -- Pharyngeal -- Pharyngeal- Nectar Cup -- Pharyngeal -- Pharyngeal- Nectar Straw Delayed swallow initiation-vallecula;Reduced epiglottic inversion;Reduced tongue base retraction;Penetration/Apiration after swallow;Pharyngeal residue - valleculae;Pharyngeal residue - pyriform;Lateral channel residue Pharyngeal Material enters airway, remains ABOVE vocal cords then ejected out;Material does not enter airway Pharyngeal- Thin Teaspoon -- Pharyngeal -- Pharyngeal- Thin Cup Delayed swallow initiation-vallecula;Delayed swallow initiation-pyriform sinuses;Reduced epiglottic inversion;Reduced tongue base retraction;Penetration/Aspiration during swallow;Pharyngeal residue - valleculae;Pharyngeal residue - pyriform;Lateral channel residue Pharyngeal Material does not enter airway;Material enters airway, remains ABOVE vocal cords then ejected out;Material enters airway, remains ABOVE vocal cords and not ejected out Pharyngeal- Thin Straw Delayed swallow initiation-vallecula;Delayed swallow initiation-pyriform sinuses;Reduced epiglottic inversion;Reduced tongue base retraction;Penetration/Aspiration during swallow;Pharyngeal residue - valleculae;Pharyngeal residue - pyriform;Lateral channel residue Pharyngeal Material enters airway, remains ABOVE vocal cords then ejected out;Material does not enter airway Pharyngeal- Puree Delayed swallow initiation-vallecula;Reduced epiglottic inversion;Reduced tongue base retraction;Pharyngeal residue -  valleculae;Pharyngeal residue - pyriform Pharyngeal -- Pharyngeal- Mechanical Soft -- Pharyngeal -- Pharyngeal- Regular Delayed swallow initiation-pyriform sinuses;Reduced epiglottic inversion;Reduced tongue base retraction;Pharyngeal residue - valleculae;Pharyngeal residue - pyriform Pharyngeal -- Pharyngeal- Multi-consistency -- Pharyngeal -- Pharyngeal- Pill Delayed swallow initiation-vallecula;Reduced epiglottic inversion;Reduced airway/laryngeal closure;Reduced tongue base retraction;Penetration/Aspiration before swallow;Penetration/Apiration after swallow;Trace aspiration;Pharyngeal residue - valleculae;Pharyngeal residue - pyriform;Lateral channel residue Pharyngeal Material enters airway, passes BELOW cords then ejected out Pharyngeal Comment --  CHL IP CERVICAL ESOPHAGEAL PHASE 08/22/2016 Cervical Esophageal Phase (No Data) Pudding Teaspoon -- Pudding Cup -- Honey Teaspoon -- Honey Cup -- Nectar Teaspoon -- Nectar Cup -- Nectar Straw -- Thin Teaspoon -- Thin Cup -- Thin Straw -- Puree -- Mechanical Soft -- Regular -- Multi-consistency -- Pill -- Cervical Esophageal Comment -- No flowsheet data found. Thank you, Havery Moros, CCC-SLP [phone] PORTER,DABNEY 08/22/2016, 7:56 PM               Critical care time 45 minutes   Chaya Jan M.D on 08/23/2016 at 5:44 PM  Between 7am to 7pm - Pager - [phone]  After 7pm go to [email or web address] - password [facility]  [facility] -  Office  [phone]

## 2016-08-23 NOTE — Evaluation (Signed)
Physical Therapy Evaluation Patient Details Name: Jon CutterWilliam P [name] MRN: [id number] DOB: 1932-03-10 Today's Date: 08/23/2016   History of Present Illness  83 y.o. male, With history of gastritis, paroxysmal atrial fibrillation, tachycardia-bradycardia syndrome, status post pacemaker placement, hypertension, status post diverting colostomy and indwelling Foley due to sacral ulcer was brought to hospital with several episodes of coffee-ground emesis at home. As per patient's son he has noticed that ostomy has not been draining as much over past few days.  CT abdomen pelvis with markedly distended stomach possible gastric outlet obstruction versus severe gastroparesis, as well multiple stones in the urinary bladder possible cystitis, patient was found to have septic shock, required to ICU and started on pressors.    Clinical Impression  Pt received in bed, son present, and pt is agreeable to PT evaluation.  Pt expressed that he has been bedbound since September 2015.  His son is his full time caregiver, and assists him with dressing and bathing.  They have a hospital bed, and a hoyer lift at home.  Pt states he has not been out of bed because of the wounds on his buttocks that they were trying to have heal.  During PT evaluation today, he required total A +2 for rolling each direction for skin check and to reposition bed pads.  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  Pt expressed strong desire to have HHPT come to the house and assist him with getting to the chair with the hoyer lift.  At this point, his mobility will not likely improve much, however will recommend a 1 week trial of PT while in the acute care setting.  Upon d/c he will need continued 24/7 supervision/assistance, and would benefit from HHPT to ensure proper use of hoyer lift, and transfers if he is able at home.     Follow  Up Recommendations SNF;Supervision/Assistance - 24 hour;Home health PT    Equipment Recommendations  None recommended by PT    Recommendations for Other Services       Precautions / Restrictions Precautions Precautions: Fall Precaution Comments: due to immobility Restrictions Weight Bearing Restrictions: No      Mobility  Bed Mobility Overal bed mobility: Needs Assistance Bed Mobility: Rolling Rolling: Total assist;+2 for physical assistance         General bed mobility comments: Pt assisted with rolling each direction to check pressure sores on his buttocks/back.  They are currently covered with a bandage.  Assisted with repositioning of bed pads.  Pt requires total A for supine scoot.  Pt's son assisted with mobility.   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  Has been at the house since Sept 2015.  Son assists him with rolling.    ADL's / Homemaking Assistance Needed: Assistance with dressing and bathing.  Normally pt can feed himself, however his son has assisted him some while in the hospital.         Hand Dominance   Dominant Hand: Right    Extremity/Trunk Assessment   Upper Extremity Assessment Upper Extremity Assessment: LUE deficits/detail;RUE deficits/detail;Generalized weakness RUE Deficits / Details: shoulder AROM is limited to roughly 90*   and strength is grossly 3/5.  pt is unable to make a full fist.  RUE Coordination: decreased fine motor;decreased gross motor LUE Deficits / Details: hand is edematous, shoulder AROM is limited to roughly 90*  and strength is grossly 3/5.  Pt is unable to make a full fist LUE Coordination: decreased fine motor;decreased gross motor    Lower Extremity Assessment Lower Extremity Assessment: LLE deficits/detail;RLE deficits/detail;Generalized weakness RLE Deficits / Details: Ankle and foot noted to be contracted in plantar flexion and inversion with pressure relief boots and bandages donned.  Knee flexion is limited due to pain with arthritis - able to achieve roughly 40* of knee flexion.  Hip flexion strength: 2-/5, knee flexion3/5 in available range, knee extension 3/5 RLE: Unable to fully assess due to pain RLE Coordination: decreased fine motor;decreased gross motor LLE Deficits / Details: Ankle and foot noted to be contracted in plantar flexion and inversion with pressure relief boots and bandages donned.  Knee flexion is limited due to pain with arthritis - able to achieve roughly 60* of knee flexion.  Hip flexion strength: 2-/5, knee flexion3/5 in available range, knee extension 3/5 LLE: Unable to fully assess due to pain LLE Coordination: decreased fine motor;decreased gross motor       Communication   Communication: No difficulties  Cognition Arousal/Alertness: Awake/alert Behavior During Therapy: WFL for tasks assessed/performed Overall Cognitive Status: Within Functional Limits for tasks assessed                      General Comments      Exercises     Assessment/Plan    PT Assessment  (PT trial for 1 week)  PT Problem List         PT Treatment Interventions      PT Goals (Current goals can be found in the Care Plan section)  Acute Rehab PT Goals Patient Stated Goal: Pt states he wants to go home and work with HHPT and get up into the w/c with his hoyer lift.   PT Goal Formulation: With patient/family Time For Goal Achievement: 08/30/16 Potential to Achieve Goals: Poor    Frequency     Barriers to discharge        Co-evaluation               End of Session   Activity Tolerance: Patient tolerated treatment well Patient left: in bed;with call bell/phone within reach;with nursing/sitter in room;with family/visitor present Nurse Communication: Mobility status;Need for lift equipment (Maxi move) PT Visit Diagnosis: Muscle weakness (generalized) (M62.81)    Functional Assessment Tool Used: AM-PAC 6 Clicks Basic Mobility;Clinical judgement Functional Limitation: Mobility: Walking and moving around Mobility: Walking and Moving Around Current  Status [phone]): 100 percent impaired, limited or restricted Mobility: Walking and Moving Around Goal Status [phone]): At least 80 percent but less than 100 percent impaired, limited or restricted    Time: 1510-1539 PT Time Calculation (min) (ACUTE ONLY): 29 min   Charges:   PT Evaluation $PT Eval Moderate Complexity: 1 Procedure PT Treatments $Therapeutic Activity: 8-22 mins   PT G Codes:   PT G-Codes **NOT FOR INPATIENT CLASS** Functional Assessment Tool Used: AM-PAC 6 Clicks Basic Mobility;Clinical judgement Functional Limitation: Mobility: Walking and moving around Mobility: Walking and Moving Around Current Status (L2440): 100 percent impaired, limited or restricted Mobility: Walking and Moving Around Goal Status (N0272): At least 80 percent but less than 100 percent impaired, limited or restricted     Beth Pearley [place], PT, DPT X: [phone]

## 2016-08-24 LAB — CBC WITH DIFFERENTIAL/PLATELET
BASOS PCT: 0 %
Basophils Absolute: 0 10*3/uL (ref 0.0–0.1)
EOS ABS: 0.2 10*3/uL (ref 0.0–0.7)
Eosinophils Relative: 2 %
HCT: 27.3 % — ABNORMAL LOW (ref 39.0–52.0)
HEMOGLOBIN: 9.2 g/dL — AB (ref 13.0–17.0)
Lymphocytes Relative: 8 %
Lymphs Abs: 0.7 10*3/uL (ref 0.7–4.0)
MCH: 28.8 pg (ref 26.0–34.0)
MCHC: 33.7 g/dL (ref 30.0–36.0)
MCV: 85.3 fL (ref 78.0–100.0)
Monocytes Absolute: 0.7 10*3/uL (ref 0.1–1.0)
Monocytes Relative: 8 %
NEUTROS PCT: 82 %
Neutro Abs: 6.7 10*3/uL (ref 1.7–7.7)
Platelets: 132 10*3/uL — ABNORMAL LOW (ref 150–400)
RBC: 3.2 MIL/uL — AB (ref 4.22–5.81)
RDW: 16.6 % — ABNORMAL HIGH (ref 11.5–15.5)
WBC: 8.3 10*3/uL (ref 4.0–10.5)

## 2016-08-24 LAB — BASIC METABOLIC PANEL
Anion gap: 5 (ref 5–15)
BUN: 12 mg/dL (ref 6–20)
CHLORIDE: 110 mmol/L (ref 101–111)
CO2: 22 mmol/L (ref 22–32)
CREATININE: 0.59 mg/dL — AB (ref 0.61–1.24)
Calcium: 8 mg/dL — ABNORMAL LOW (ref 8.9–10.3)
GFR calc non Af Amer: >60 mL/min (ref >60)
Glucose, Bld: 94 mg/dL (ref 65–99)
POTASSIUM: 5.2 mmol/L — AB (ref 3.5–5.1)
SODIUM: 137 mmol/L (ref 135–145)

## 2016-08-24 MED ORDER — CLINDAMYCIN PHOSPHATE 600 MG/50ML IV SOLN
600.0000 mg | Freq: Three times a day (TID) | INTRAVENOUS | Status: DC
  Administered 2016-08-24 – 2016-08-25 (×3): 600 mg via INTRAVENOUS
  Filled 2016-08-24 (×7): qty 50

## 2016-08-24 MED ORDER — LINACLOTIDE 72 MCG PO CAPS
72.0000 ug | ORAL_CAPSULE | ORAL | Status: DC
  Filled 2016-08-24: qty 1

## 2016-08-24 MED ORDER — SODIUM CHLORIDE 0.9 % IV SOLN
INTRAVENOUS | Status: DC
  Administered 2016-08-24 (×2): via INTRAVENOUS

## 2016-08-24 MED ORDER — KETOROLAC TROMETHAMINE 15 MG/ML IJ SOLN
15.0000 mg | Freq: Once | INTRAMUSCULAR | Status: AC
  Administered 2016-08-24: 15 mg via INTRAVENOUS
  Filled 2016-08-24: qty 1

## 2016-08-24 NOTE — Progress Notes (Signed)
PROGRESS NOTE                                                                                                                                                                                                             Patient Demographics:    Jon [name], is a 83 y.o. male, DOB - 03/05/1932, ZOX:[id number]  Admit date - 08/18/2016   Admitting Physician Meredeth Ide, MD  Outpatient Primary MD for the patient is Milana Obey, MD   Outpatient Specialists: Cardiology Dr. Loney Hering, GI Dr Darrick Penna.  Chief Complaint  Patient presents with  . Emesis       Brief Narrative   83 y.o. male, with a past medical history significant for PAF On warfarin , tachybradycardia syndrome status post pacemaker, chronic diastolic CHF, neuropathy, OSA, HTN, anxiety, s/p diverting colostomy & indwelling Foley due to Sacral Ulcer, and ambulatory at bedside, bedbound, patient was brought to the hospital due to several episodes of coffee-ground emesis, patient with known colostomy has not been draining much for few days, CT abdomen pelvis with markedly distended stomach possible gastric outlet obstruction versus severe gastroparesis, as well multiple stones in the urinary bladder possible cystitis, patient was found to have septic shock, required to ICU and started on pressors.   Subjective:    Jon [name] today has, No headache, No chest pain, No abdominal pain or Shortness of breath, report generalized weakness and fatigue.   Assessment  & Plan :    Active Problems:   Hypotension   UGI bleed   Pressure injury of skin   Gastric distention   Heme positive stool   Septic shock (HCC)   Goals of care, counseling/discussion   DNR (do not resuscitate) discussion   Palliative care encounter   N&V (nausea and vomiting)   Leukocytosis   Septic shock - Hypotension resolved, Has been weaned off pressors. - Follow on blood cultures, no growth on day 5 (final). -  Chest x-ray with evidence of right lower lobe pneumonia, most likely aspiration pneumonia. -Transition to PO clindamycin for 5 more days. - Sacral  pressure ulcer appears to be healing nicely with no evidence of infection - Sepsis-most likely related to urinary source and aspiration PNA, follow on urine cultures, foley catheter has been changed. -significant improvement overnight in leukocytosis, now down to 8.3 from a high  of 34.4.  Gastric outlet obstruction - NGT removed. No N/V. -Appreciate GI input: to consider EGD once no longer on pressors. Patient most likely has gastroparesis. EGD in 11/16 without pyloric stenosis. -Has been started on a dysphagia 1 diet with nectar thick liquids per speech therapy recommendations. -Per GI, no EGD this admission.  Acute hypoxic respiratory failure -Likely due to aspiration PNA, -Improved, continue to wean oxygen as tolerated.  Aspiration pneumonia - Transition to PO clinda for 5 more days.  Chronic diastolic CHF - Recent echo on 16/1096 with EF 65%, grade 2 diastolic dysfunction. -No signs of volume overload at present.  Atrial fibrillation/tachybradycardia syndrome - Patient with known history of A. Fib, on warfarin supratherapeutic INR on presentation of 3.14. - Restart sotalol as HR now in the 120-130s and BP can tolerate.  History of hypertension - BP improved; now off pressors; resume sotalol given elevated HRs.  Coagulopathy - Both therapeutic INR on admission 3.1 ,received  vitamin K and FFP, INR 1.46 on 3/7.  Hypokalemia - Replaced.  Protein calorie malnutrition -Start diet pending ST recs.  Pressure ulcer, Stage 1 - Appears to be healing nicely, almost closed no discharge or evidence of infection on physical exam  Hyperglycemia - Check hemoglobin A1c, CBG every 6 time X4  Code Status : Full  Family Communication  : Son at bedside  Disposition Plan  : remains in ICU, try to wean pressors today.  Consults  :   GI  Procedures  : None  DVT Prophylaxis  :   SCDs   Lab Results  Component Value Date   PLT 132 (L) 08/24/2016    Antibiotics  :    Anti-infectives    Start     Dose/Rate Route Frequency Ordered Stop   08/20/16 1100  metroNIDAZOLE (FLAGYL) IVPB 500 mg  Status:  Discontinued     500 mg 100 mL/hr over 60 Minutes Intravenous Every 8 hours 08/20/16 1052 08/21/16 1159   08/20/16 0745  vancomycin (VANCOCIN) 500 mg in sodium chloride irrigation 0.9 % 100 mL ENEMA  Status:  Discontinued     500 mg Rectal Every 6 hours 08/20/16 0739 08/20/16 0739   08/19/16 0500  vancomycin (VANCOCIN) IVPB 1000 mg/200 mL premix     1,000 mg 200 mL/hr over 60 Minutes Intravenous Every 24 hours 08/18/16 1845     08/18/16 2300  piperacillin-tazobactam (ZOSYN) IVPB 3.375 g     3.375 g 12.5 mL/hr over 240 Minutes Intravenous Every 8 hours 08/18/16 1845     08/18/16 1700  piperacillin-tazobactam (ZOSYN) IVPB 3.375 g     3.375 g 100 mL/hr over 30 Minutes Intravenous  Once 08/18/16 1652 08/18/16 1729   08/18/16 1700  vancomycin (VANCOCIN) IVPB 1000 mg/200 mL premix     1,000 mg 200 mL/hr over 60 Minutes Intravenous  Once 08/18/16 1652 08/18/16 1833        Objective:   Vitals:   08/24/16 1330 08/24/16 1400 08/24/16 1430 08/24/16 1500  BP: 98/82 98/73 102/77   Pulse: 100 93 88 91  Resp: 19 19 16  (!) 22  Temp:      TempSrc:      SpO2: 100% 100% 100% 98%  Weight:      Height:        Wt Readings from Last 3 Encounters:  08/24/16 61.3 kg (135 lb 2.3 oz)  05/11/16 62 kg (136 lb 11 oz)  05/05/16 68.2 kg (150 lb 5.7 oz)     Intake/Output Summary (Last  24 hours) at 08/24/16 1653 Last data filed at 08/24/16 1500  Gross per 24 hour  Intake          2418.34 ml  Output             3150 ml  Net          -731.66 ml     Physical Exam  Awake Alert, Oriented X 3, Frail, chronically ill-appearing Disheveled, long nails Supple Neck,No JVD, .  Symmetrical Chest wall movement, diminished air movement  bilaterally, no wheezing Irreg,No Gallops,Rubs or new Murmurs, No Parasternal Heave +ve B.Sounds, Abd Soft, No tenderness, No rebound - guarding or rigidity, has colostomy bag which is empty Long nails, poor skin condition, hyperkeratosis, saccular pressure ulcer which is unstageable as it is healing and closed, no evidence of discharge or infection    Data Review:    CBC  Recent Labs Lab 08/18/16 1521  08/20/16 0415 08/21/16 0414 08/22/16 0419 08/23/16 0402 08/24/16 0445  WBC 23.2*  < > 31.7* 18.5* 11.5* 10.3 8.3  HGB 15.8  < > 10.7* 9.6* 9.6* 9.1* 9.2*  HCT 45.9  < > 31.3* 28.5* 29.1* 27.5* 27.3*  PLT 379  < > 226 150 120* 124* 132*  MCV 84.1  < > 84.8 85.3 85.8 86.2 85.3  MCH 28.9  < > 29.0 28.7 28.3 28.5 28.8  MCHC 34.4  < > 34.2 33.7 33.0 33.1 33.7  RDW 15.6*  < > 16.1* 16.2* 16.5* 16.7* 16.6*  LYMPHSABS 0.9  --   --   --   --   --  0.7  MONOABS 0.9  --   --   --   --   --  0.7  EOSABS 0.0  --   --   --   --   --  0.2  BASOSABS 0.0  --   --   --   --   --  0.0  < > = values in this interval not displayed.  Chemistries   Recent Labs Lab 08/18/16 1521 08/19/16 0317 08/20/16 0415 08/21/16 0414 08/22/16 0419 08/23/16 0402 08/24/16 0445  NA 136 139 135 137 138 139 137  K 4.9 4.2 3.0* 3.7 4.7 4.6 5.2*  CL 88* 93* 96* 104 109 112* 110  CO2 36* 32 28 27 24  21* 22  GLUCOSE 201* 173* 117* 92 90 142* 94  BUN 31* 32* 40* 26* 18 14 12   CREATININE 1.34* 1.41* 1.23 0.86 0.91 0.69 0.59*  CALCIUM 9.6 9.3 8.2* 8.0* 8.1* 8.0* 8.0*  MG  --   --   --  1.5*  --   --   --   AST 27 25  --   --   --   --   --   ALT 11* 12*  --   --   --   --   --   ALKPHOS 74 61  --   --   --   --   --   BILITOT 0.9 0.8  --   --   --   --   --    ------------------------------------------------------------------------------------------------------------------ No results for input(s): CHOL, HDL, LDLCALC, TRIG, CHOLHDL, LDLDIRECT in the last 72 hours.  Lab Results  Component Value Date    HGBA1C 5.3 08/20/2016   ------------------------------------------------------------------------------------------------------------------ No results for input(s): TSH, T4TOTAL, T3FREE, THYROIDAB in the last 72 hours.  Invalid input(s): FREET3 ------------------------------------------------------------------------------------------------------------------ No results for input(s): VITAMINB12, FOLATE, FERRITIN, TIBC, IRON, RETICCTPCT in the last 72 hours.  Coagulation profile  Recent Labs Lab 08/18/16 1521 08/19/16 0317 08/21/16 0414  INR 3.14 3.00 1.46    No results for input(s): DDIMER in the last 72 hours.  Cardiac Enzymes No results for input(s): CKMB, TROPONINI, MYOGLOBIN in the last 168 hours.  Invalid input(s): CK ------------------------------------------------------------------------------------------------------------------ No results found for: BNP  Inpatient Medications  Scheduled Meds: . Chlorhexidine Gluconate Cloth  6 each Topical Q0600  . feeding supplement (ENSURE ENLIVE)  237 mL Oral BID BM  . [START ON 08/26/2016] linaclotide  72 mcg Oral QODAY  . mupirocin ointment  1 application Nasal BID  . piperacillin-tazobactam (ZOSYN)  IV  3.375 g Intravenous Q8H  . polyethylene glycol  17 g Oral Daily  . sotalol  160 mg Oral Q12H  . vancomycin  1,000 mg Intravenous Q24H   Continuous Infusions: . sodium chloride 75 mL/hr at 08/24/16 0920   PRN Meds:.acetaminophen **OR** acetaminophen, promethazine, RESOURCE THICKENUP CLEAR  Micro Results Recent Results (from the past 240 hour(s))  Blood Culture (routine x 2)     Status: None   Collection Time: 08/18/16  3:23 PM  Result Value Ref Range Status   Specimen Description LEFT ANTECUBITAL  Final   Special Requests BOTTLES DRAWN AEROBIC AND ANAEROBIC 6CC EACH  Final   Culture NO GROWTH 5 DAYS  Final   Report Status 08/23/2016 FINAL  Final  Blood Culture (routine x 2)     Status: None   Collection Time: 08/18/16   3:53 PM  Result Value Ref Range Status   Specimen Description LEFT ANTECUBITAL  Final   Special Requests BOTTLES DRAWN AEROBIC AND ANAEROBIC 8CC EACH  Final   Culture NO GROWTH 5 DAYS  Final   Report Status 08/23/2016 FINAL  Final  MRSA PCR Screening     Status: Abnormal   Collection Time: 08/18/16  8:19 PM  Result Value Ref Range Status   MRSA by PCR POSITIVE (A) NEGATIVE Final    Comment:        The GeneXpert MRSA Assay (FDA approved for NASAL specimens only), is one component of a comprehensive MRSA colonization surveillance program. It is not intended to diagnose MRSA infection nor to guide or monitor treatment for MRSA infections. RESULT CALLED TO, READ BACK BY AND VERIFIED WITH:  HEARN,J @ 2342 ON 08/18/16 BY JUW   Urine culture     Status: Abnormal   Collection Time: 08/19/16 12:00 PM  Result Value Ref Range Status   Specimen Description URINE, CATHETERIZED  Final   Special Requests NONE  Final   Culture MULTIPLE SPECIES PRESENT, SUGGEST RECOLLECTION (A)  Final   Report Status 08/21/2016 FINAL  Final    Radiology Reports Ct Abdomen Pelvis Wo Contrast  Result Date: 08/18/2016 CLINICAL DATA:  Weakness, vomiting, and hypotension. EXAM: CT ABDOMEN AND PELVIS WITHOUT CONTRAST TECHNIQUE: Multidetector CT imaging of the abdomen and pelvis was performed following the standard protocol without IV contrast. COMPARISON:  CT scans since June 15, 2014 FINDINGS: Lower chest: The small left pleural effusion is decreased in size in the interval. The right-sided pleural effusion has resolved. There is fluid in the distal esophagus. Coronary artery calcifications are noted. Mild dependent atelectasis. No acute abnormalities in the lung bases. Hepatobiliary: Cholelithiasis is identified. The liver is unchanged. Pancreas: Unremarkable. No pancreatic ductal dilatation or surrounding inflammatory changes. Spleen: Normal in size without focal abnormality. Adrenals/Urinary Tract: Multiple simple  and hyperdense renal cysts are seen, unchanged. The largest is on the right measuring at least 11 cm in  transverse dimension. Vascular calcifications are associated with the kidneys. There are also renal stones. The stone in the right renal pelvis is larger in the interval measuring 17 by 9 mm. There is no resulting hydronephrosis but there is mild increased attenuation of fat adjacent to the right renal pelvis. Remainder of the right ureter is normal in caliber. No left-sided stones. The left ureter is normal. There is a Foley catheter in the bladder. Multiple stones are seen primarily dependently in the bladder. There is mild stranding adjacent to the bladder which could be seen with cystitis. This finding is similar in the interval however. Stomach/Bowel: The stomach is markedly distended. This finding is worsened in the interval. 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 The duodenum itself is normal in caliber. The remainder of the small bowel is normal in caliber as well. The patient is status post partial colectomy. There is a left lower quadrant ostomy. The remainder of the colon is unremarkable. Scattered diverticuli without diverticulitis. No evidence of appendicitis. Vascular/Lymphatic: Atherosclerotic change is seen in the non aneurysmal aorta. No adenopathy. Reproductive: Prostate is unremarkable. Other: Mild increased attenuation in the fat of the pericolic gutters is stable as is the mild increased attenuation anterior to the sacrum. Musculoskeletal: No interval bony changes identified. IMPRESSION: 1. Markedly distended stomach, worsened in the interval. This could be seen with gastric outlet obstruction or severe gastroparesis. 2. 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 3. Cholelithiasis. 4. Multiple renal cysts. The stone in the right renal pelvis has increased in the interval and there is now mild  increased attenuation of fat adjacent to the right renal pelvis which could be due to intermittent obstruction. No hydronephrosis on today's study. 5. Multiple stones in the bladder. Mild fat stranding adjacent to the bladder could be due to cystitis. Recommend correlation with urinalysis to assess for UTI. 6. Atherosclerosis. 7. Diverticulosis. Electronically Signed   By: Gerome Sam III M.D   On: 08/18/2016 17:58   Dg Chest Port 1 View  Result Date: 08/19/2016 CLINICAL DATA:  Hypoxia. EXAM: PORTABLE CHEST 1 VIEW COMPARISON:  08/18/2016 and 05/10/2016 FINDINGS: NG tube tip is in the left upper quadrant the abdomen. Pacemaker in place. There is a patchy infiltrate at the right lung base. Left lung is clear. Heart size and vascularity are normal. Calcification in the arch of the aorta. No acute bone abnormality. IMPRESSION: New patchy infiltrate at the right lung base. Aortic atherosclerosis. Electronically Signed   By: Francene Boyers M.D.   On: 08/19/2016 09:48   Dg Chest Port 1 View  Result Date: 08/18/2016 CLINICAL DATA:  Hypotension and vomiting today.  05/10/2016 EXAM: PORTABLE CHEST 1 VIEW COMPARISON:  None. FINDINGS: Left-sided transvenous pacemaker leads to the right atrium and right ventricle. Heart is mildly enlarged. Aorta is tortuous and partially calcified. Lungs are clear. No pulmonary edema. No free intraperitoneal air beneath the diaphragm. IMPRESSION: Stable cardiomegaly.  No evidence for acute pulmonary abnormality. Electronically Signed   By: Norva Pavlov M.D.   On: 08/18/2016 15:30   Dg Chest Port 1v Same Day  Result Date: 08/19/2016 CLINICAL DATA:  PICC line placement EXAM: PORTABLE CHEST 1 VIEW COMPARISON:  08/19/2016 FINDINGS: Left chest wall pacer device is noted with lead in the right atrial appendage and right ventricle. Normal heart size. Aortic atherosclerosis noted. The right arm PICC line tip is at  the cavoatrial junction. Patchy infiltrate within the right base is  unchanged from previous exam. Left lung is clear. IMPRESSION: 1. Right arm PICC line tip is at the cavoatrial junction. 2. Persistent right base infiltrate. Electronically Signed   By: Signa Kellaylor  Stroud M.D.   On: 08/19/2016 12:02   Dg Abd 2 Views  Result Date: 08/18/2016 CLINICAL DATA:  Nausea and vomiting. History of gastritis and hypertension. EXAM: ABDOMEN - 2 VIEW COMPARISON:  05/04/2016 FINDINGS: Two supine views. The more cephalad imaged excludes the far left-sided abdomen. Nasogastric tube has been removed. Moderate-to-marked gaseous distension of the stomach. No gross free intraperitoneal air. No bowel distension. Osteopenia. Right hip osteoarthritis. IMPRESSION: Gaseous distension of the stomach, nonspecific.  CT pending. Electronically Signed   By: Jeronimo GreavesKyle  Talbot M.D.   On: 08/18/2016 16:57   Dg Swallowing Func-speech Pathology  Result Date: 08/22/2016 Objective Swallowing Evaluation: Type of Study: MBS-Modified Barium Swallow Study Patient Details Name: [name] Murri MRN: [id number] Date of Birth: Sep 01, 1931 Today's Date: 08/22/2016 Time: SLP Start Time (ACUTE ONLY): 1100-SLP Stop Time (ACUTE ONLY): 1145 SLP Time Calculation (min) (ACUTE ONLY): 45 min Past Medical History: Past Medical History: Diagnosis Date . Abnormality of gait  . Arthritis  . B12 deficiency  . Bilateral hydronephrosis  . Bladder calculi  . Bleeding ulcer  . Dysrhythmia  . Essential hypertension  . Essential tremor  . Gastritis  . Macular degeneration of left eye  . Memory loss  . Multiple rib fractures  . Neuropathy (HCC)  . OSA (obstructive sleep apnea)   Does not use CPAP . PAF (paroxysmal atrial fibrillation) (HCC)  . Presence of permanent cardiac pacemaker  . Tachycardia-bradycardia syndrome (HCC)   [employer] PPM - Dr. Royann Shiversroitoru . Varicose veins   Bilateral Past Surgical History: Past Surgical History: Procedure Laterality Date . BRAIN SURGERY   . CATARACT EXTRACTION Bilateral 2013 . COLON SURGERY   . COLOSTOMY N/A 07/11/2014   Procedure: DIVERTING COLOSTOMY;  Surgeon: Dalia HeadingMark A Jenkins, MD;  Location: [facility];  Service: General;  Laterality: N/A;  wound class: clean contaminated . ESOPHAGOGASTRODUODENOSCOPY N/A 05/16/2015  Dr. Darrick PennaFields: few linear erosions/ulcerastions in distal esophagus, non-erosive gastritis, no duodenal abnormalities. Path with chronic gastritis, negative H.pylori.  . INCISION AND DRAINAGE OF WOUND N/A 07/11/2014  Procedure: DEBRIDEMENT OF SACRUM;  Surgeon: Dalia HeadingMark A Jenkins, MD;  Location: [facility];  Service: General;  Laterality: N/A; . INSERT / REPLACE / REMOVE PACEMAKER   . MENINGIOMA REMOVAL  1992  POST RT FRONTAL  . PACEMAKER INSERTION  2006 . PERMANENT PACEMAKER GENERATOR CHANGE N/A 08/13/2012  Procedure: PERMANENT PACEMAKER GENERATOR CHANGE;  Surgeon: Thurmon FairMihai Croitoru, MD;  Location: [facility];  Service: Cardiovascular;  Laterality: N/A; HPI: 83 y.o. male, with a past medical history significant for PAF On warfarin , tachybradycardia syndrome status post pacemaker, chronic diastolic CHF, neuropathy, OSA, HTN, anxiety, s/p diverting colostomy & indwelling Foley due to Sacral Ulcer, and ambulatory at bedside, bedbound, patient was brought to the hospital due to several episodes of coffee-ground emesis, patient with known colostomy has not been draining much for few days, CT abdomen pelvis with markedly distended stomach possible gastric outlet obstruction versus severe gastroparesis, as well multiple stones in the urinary bladder possible cystitis, patient was found to have septic shock, required to ICU and started on pressors. Pt had NG placed (now removed for suction). BSE ordered. Pt with RLL PNA likely from emesis and possible aspiration prior to admission.  Subjective: "ok" Assessment / Plan / Recommendation CHL  IP CLINICAL IMPRESSIONS 08/22/2016 Clinical Impression Pt assessed in the lateral position with barium tinged thin, puree, regular textures, barium tablet with thin, and nectar-thick liquids. Pt presents with  moderate oropharyngeal phase dysphagia characterized by delayed oral transit, impaired lingual movement, delayed AP transit, piecemeal deglutition, delay in swallow initiation with swallow trigger after spilling to pyriforms across all liquids and mech soft textures, decreased tongue base retraction, decreased epiglottic deflection, and decreased laryngeal vestibule closure resulting in significant vallecular and pyriform residue after the swallow. Pt with penetration of thin liquids from vallecular residue trickling down into laryngeal vestibule after the swallow. Pt does produce throat clear when liquids reach vocal folds (not always completely cleared from laryngeal vestibule, but does clear from vocal folds). Pt also demonstrated penetration of thin liquids before the swallow and trace aspiration after the swallow when taking straw sips of thin liquid with barium tablet (this appeared to be removed, however Pt's shoulders limit viewing). Pt is at moderate to mod/severe risk for aspiration given the amount of vallecular and pyriform residue post swallow across consistencies and textures (primarily due to weakness of oropharyngeal structures). Cued cough and cues to repeat/dry swallow were effective in reducing pharyngeal residue, however not completely eliminated. Pt would also be at risk for aspiration of his saliva/secretions. Recommend puree vs D2/chopped (per GI) and thin liquids with strict aspiration and reflux precautions: Pt to sit fully upright for all eating/drinking, swallow 2-3x for each bite/sip, cough/clear throat every few bites/sips and repeat swallow. Suspect that Pt will likely cough and present with wet vocal quality after meals due to residuals in pharynx. He will need cues to "keep swallowing". Pt may have local inflammation near pyriforms (difficult to visualize) and pt reports sore throat from NG recently removed. Also noted likely osteophyte protruding into pharyngeal space near C5-6,  however this did not lead to aspiration. SLP to follow during acute setting. Recommend follow up dysphagia therapy to focus on strengthening oropharyngeal structures if Pt willing/able to particpate. SLP Visit Diagnosis Dysphagia, oropharyngeal phase (R13.12) Attention and concentration deficit following -- Frontal lobe and executive function deficit following -- Impact on safety and function Moderate aspiration risk   CHL IP TREATMENT RECOMMENDATION 08/22/2016 Treatment Recommendations Therapy as outlined in treatment plan below   Prognosis 08/22/2016 Prognosis for Safe Diet Advancement Fair Barriers to Reach Goals Severity of deficits Barriers/Prognosis Comment -- CHL IP DIET RECOMMENDATION 08/22/2016 SLP Diet Recommendations Dysphagia 2 (Fine chop) solids;Thin liquid;Dysphagia 1 (Puree) solids Liquid Administration via Cup;Straw Medication Administration Whole meds with puree Compensations Slow rate;Small sips/bites;Multiple dry swallows after each bite/sip;Clear throat intermittently;Effortful swallow Postural Changes Remain semi-upright after after feeds/meals (Comment);Seated upright at 90 degrees   CHL IP OTHER RECOMMENDATIONS 08/22/2016 Recommended Consults -- Oral Care Recommendations Oral care BID;Staff/trained caregiver to provide oral care Other Recommendations Clarify dietary restrictions   CH[facility] IP FOLLOW UP RECOMMENDATIONS 08/22/2016 Follow up Recommendations Skilled Nursing facility   [facility] IP FREQUENCY AND DURATION 08/22/2016 Speech Therapy Frequency (ACUTE ONLY) min 2x/week Treatment Duration 1 week      CHL IP ORAL PHASE 08/22/2016 Oral Phase Impaired Oral - Pudding Teaspoon -- Oral - Pudding Cup -- Oral - Honey Teaspoon -- Oral - Honey Cup -- Oral - Nectar Teaspoon -- Oral - Nectar Cup -- Oral - Nectar Straw -- Oral - Thin Teaspoon -- Oral - Thin Cup -- Oral - Thin Straw -- Oral - Puree -- Oral - Mech Soft -- Oral - Regular Weak lingual manipulation;Impaired mastication;Piecemeal swallowing;Delayed oral transit  Oral - Multi-Consistency -- Oral - Pill -- Oral Phase - Comment --  CHL IP PHARYNGEAL PHASE 08/22/2016 Pharyngeal Phase Impaired Pharyngeal- Pudding Teaspoon -- Pharyngeal -- Pharyngeal- Pudding Cup -- Pharyngeal -- Pharyngeal- Honey Teaspoon -- Pharyngeal -- Pharyngeal- Honey Cup -- Pharyngeal -- Pharyngeal- Nectar Teaspoon -- Pharyngeal -- Pharyngeal- Nectar Cup -- Pharyngeal -- Pharyngeal- Nectar Straw Delayed swallow initiation-vallecula;Reduced epiglottic inversion;Reduced tongue base retraction;Penetration/Apiration after swallow;Pharyngeal residue - valleculae;Pharyngeal residue - pyriform;Lateral channel residue Pharyngeal Material enters airway, remains ABOVE vocal cords then ejected out;Material does not enter airway Pharyngeal- Thin Teaspoon -- Pharyngeal -- Pharyngeal- Thin Cup Delayed swallow initiation-vallecula;Delayed swallow initiation-pyriform sinuses;Reduced epiglottic inversion;Reduced tongue base retraction;Penetration/Aspiration during swallow;Pharyngeal residue - valleculae;Pharyngeal residue - pyriform;Lateral channel residue Pharyngeal Material does not enter airway;Material enters airway, remains ABOVE vocal cords then ejected out;Material enters airway, remains ABOVE vocal cords and not ejected out Pharyngeal- Thin Straw Delayed swallow initiation-vallecula;Delayed swallow initiation-pyriform sinuses;Reduced epiglottic inversion;Reduced tongue base retraction;Penetration/Aspiration during swallow;Pharyngeal residue - valleculae;Pharyngeal residue - pyriform;Lateral channel residue Pharyngeal Material enters airway, remains ABOVE vocal cords then ejected out;Material does not enter airway Pharyngeal- Puree Delayed swallow initiation-vallecula;Reduced epiglottic inversion;Reduced tongue base retraction;Pharyngeal residue - valleculae;Pharyngeal residue - pyriform Pharyngeal -- Pharyngeal- Mechanical Soft -- Pharyngeal -- Pharyngeal- Regular Delayed swallow initiation-pyriform sinuses;Reduced  epiglottic inversion;Reduced tongue base retraction;Pharyngeal residue - valleculae;Pharyngeal residue - pyriform Pharyngeal -- Pharyngeal- Multi-consistency -- Pharyngeal -- Pharyngeal- Pill Delayed swallow initiation-vallecula;Reduced epiglottic inversion;Reduced airway/laryngeal closure;Reduced tongue base retraction;Penetration/Aspiration before swallow;Penetration/Apiration after swallow;Trace aspiration;Pharyngeal residue - valleculae;Pharyngeal residue - pyriform;Lateral channel residue Pharyngeal Material enters airway, passes BELOW cords then ejected out Pharyngeal Comment --  CHL IP CERVICAL ESOPHAGEAL PHASE 08/22/2016 Cervical Esophageal Phase (No Data) Pudding Teaspoon -- Pudding Cup -- Honey Teaspoon -- Honey Cup -- Nectar Teaspoon -- Nectar Cup -- Nectar Straw -- Thin Teaspoon -- Thin Cup -- Thin Straw -- Puree -- Mechanical Soft -- Regular -- Multi-consistency -- Pill -- Cervical Esophageal Comment -- No flowsheet data found. Thank you, Havery Moros, CCC-SLP [phone] PORTER,DABNEY 08/22/2016, 7:56 PM               Critical care time 45 minutes   Chaya Jan M.D on 08/24/2016 at 4:53 PM  Between 7am to 7pm - Pager - [phone]  After 7pm go to [email or web address] - password [facility]  [facility] -  Office  [phone]

## 2016-08-24 NOTE — Progress Notes (Signed)
Patient transported to 339. Patient tolerated well, vital signs stable and pt placed on remote tele. Report given to RN.

## 2016-08-24 NOTE — Progress Notes (Signed)
Pt stating he is having back pain and Tylenol did not work. Dr. Katrinka BlazingSmith on call paged and made aware. Waiting for call back.

## 2016-08-24 NOTE — Progress Notes (Signed)
Patient had a great night and slept well. Complained of nausea once around 0515. Gave phenergan PRN dose. Nausea decreased. Checked colostomy and it was filled with stool and overflowing. Stoma is pink and red and clean. Colostomy bag was changed. Son stated this occasionally happens. Son states patient will go a few days with minimum output and then it will all of a sudden overflow. Patient has many bruises and skin tears on BLE which requires foams.  Will continue to monitor patient  Genelle Balameron D Madoline Bhatt, RN

## 2016-08-24 NOTE — Progress Notes (Signed)
Patient ID: Jon CutterWilliam P [name], male   DOB: 30-Sep-1931, 83 y.o.   MRN: [id number]   Assessment/Plan: ADMITTED WITH COFFEE GROUND EMESIS AND UROSEPSIS REQUIRING PRESSORS. CLINICALLY IMPROVED. SON CONCERNED ABOUT STOOL BEING ROUND AND HARD TO PASS. GIVEN LINZESS 72 MCG & HAD LARGE WATERY STOOL. ON DYSPHAGIA 1 DIET. TOLERATING POs.  PLAN: 1. LINZESS 72 MCG QOD. MAY NEED TO TITRATE DOSE AS OUTPATIENT TO 31 MCG DAILY. 2. HOLD MIRALAX. 3. CONTINUE TO MONITOR SYMPTOMS. 4. NO INDICATION FOR ENDOSCOPY AT THIS TIME.    Subjective: Since I last evaluated the patient HE IS TOLERATING HIS DYSPHAGIA 1 DIET. OCCASIONAL NAUSEA. Marland Kitchen.NO ABDOMINAL PAIN OR VOMITING. NO BRBPR OR MELENA.  Objective: Vital signs in last 24 hours: Vitals:   08/24/16 0700 08/24/16 0800  BP: 115/78 110/86  Pulse:  (!) 101  Resp: 20 20  Temp:     General appearance: alert, cooperative and no distress Resp: clear to auscultation bilaterally Cardio: regular rate and rhythm GI: soft, non-tender; bowel sounds normal; OSTOMY IN LLQ WITH LIQUID BROWN STOOL IN BAG.  Lab Results:  Hb 9.6-9.2  Studies/Results: No results found.  Medications: I have reviewed the patient's current medications.   LOS: 5 days   Jon [name] 11/25/2013, 2:23 PM

## 2016-08-24 NOTE — Progress Notes (Signed)
Son at bedside. Patient did not eat breakfast and refused insure. MD advised of poor appetite, loose stools and nausea. MD placed order for transfer to tele. Patient vitals stable at this time.

## 2016-08-25 LAB — BASIC METABOLIC PANEL
Anion gap: 4 — ABNORMAL LOW (ref 5–15)
BUN: 12 mg/dL (ref 6–20)
CHLORIDE: 107 mmol/L (ref 101–111)
CO2: 23 mmol/L (ref 22–32)
CREATININE: 0.59 mg/dL — AB (ref 0.61–1.24)
Calcium: 7.8 mg/dL — ABNORMAL LOW (ref 8.9–10.3)
GFR calc non Af Amer: >60 mL/min (ref >60)
GLUCOSE: 84 mg/dL (ref 65–99)
Potassium: 4.3 mmol/L (ref 3.5–5.1)
Sodium: 134 mmol/L — ABNORMAL LOW (ref 135–145)

## 2016-08-25 LAB — CBC
HCT: 26.3 % — ABNORMAL LOW (ref 39.0–52.0)
Hemoglobin: 8.7 g/dL — ABNORMAL LOW (ref 13.0–17.0)
MCH: 27.9 pg (ref 26.0–34.0)
MCHC: 33.1 g/dL (ref 30.0–36.0)
MCV: 84.3 fL (ref 78.0–100.0)
Platelets: 144 10*3/uL — ABNORMAL LOW (ref 150–400)
RBC: 3.12 MIL/uL — AB (ref 4.22–5.81)
RDW: 16.6 % — ABNORMAL HIGH (ref 11.5–15.5)
WBC: 5.5 10*3/uL (ref 4.0–10.5)

## 2016-08-25 LAB — VANCOMYCIN, TROUGH: VANCOMYCIN TR: 20 ug/mL (ref 15–20)

## 2016-08-25 MED ORDER — AMOXICILLIN-POT CLAVULANATE 875-125 MG PO TABS: 1.0000 | ORAL_TABLET | Freq: Two times a day (BID) | ORAL | 0 refills | Status: DC

## 2016-08-25 MED ORDER — CLINDAMYCIN PHOSPHATE 600 MG/50ML IV SOLN
INTRAVENOUS | Status: AC
  Filled 2016-08-25: qty 50

## 2016-08-25 MED ORDER — AMOXICILLIN-POT CLAVULANATE 875-125 MG PO TABS
1.0000 | ORAL_TABLET | Freq: Two times a day (BID) | ORAL | Status: DC
  Administered 2016-08-25: 1 via ORAL
  Filled 2016-08-25: qty 1

## 2016-08-25 NOTE — Progress Notes (Signed)
RN offered to empty or change pts colostomy bag, pt refused and told me to leave it for now. Adv pt bag was not full but could be emptied.  Pt stated to leave it for now because sometimes its a mess.

## 2016-08-25 NOTE — Progress Notes (Signed)
Patient ID: [name] Zeis, male   DOB: 25-Jan-1932, 83 y.o.   MRN: [id number]   Assessment/Plan: ADMITTED WITH COFFEE GROUND EMESIS & UROSEPSIS. LIQUID BROWN STOOL IN BAG AFTER LINZESS x1 72 MCG  PLAN: 1. CONSIDER CHANGING CLINDAMYCIN TO AUGMENTIN TO PREVENT C DIFF. 2. NO NEED FOR PRIOR H2B AT THIS TIME 3. HOLD LINZESS. EXPLAINED HOW TO REDUCE DOSE TO SON ON DISCHARGE. OPEN LINZESS CAPSULE. PLACE GRANULES IN 4 TEASPOONS OF WATER. STIR IT FOR 30 SECONDS. TAKE 2 TSP OF THE WATER DAILY. NO NEED TO TAKE THE GRANULES THE MEDICINE IS IN THE WATER. IT MAY CAUSE EXPLOSIVE DIARRHEA.    Subjective: Since I last evaluated the patient HE IS TOLERATING POs. NO BRBPR OR MELENA. NO NAUSEA OR VOMITING.  Objective: Vital signs in last 24 hours: Vitals:   08/24/16 2356 08/25/16 0635  BP: 90/64 (!) 100/56  Pulse: (!) 58 92  Resp: 18 18  Temp: 97.5 F (36.4 C) 97.8 F (36.6 C)   General appearance: alert, cooperative and no distress Resp: clear to auscultation bilaterally Cardio: regular rate and rhythm GI: soft, non-tender; bowel sounds normal;   Lab Results:  K 4.3 Hb 8.7 Cr 0.59  Studies/Results: No results found.  Medications: I have reviewed the patient's current medications.   LOS: 5 days   Jonette EvaSandi Marlys Stegmaier 11/25/2013, 2:23 PM

## 2016-08-25 NOTE — Progress Notes (Addendum)
Patient discharging home where he lives with his son.  EMS called to transport.  Reviewed DC instructions and medications with son.  Educated on Abx use and emphasized importance of completing entire dose.  Educated on how to do foley care properly to prevent UTI.  PICC removed - WNL, instructed to leave pressure dressing for 24 hours.  Patient and son verbalized understanding of instructions.  No questions at this time.  Patient in NAD waiting on arrival of transport.  [facility] notified to resume services

## 2016-08-25 NOTE — Plan of Care (Signed)
Problem: Skin Integrity: Goal: Risk for impaired skin integrity will decrease Outcome: Adequate for Discharge Educated patient and family on importance of turning and repositioning to prevent skin breakdown

## 2016-08-25 NOTE — Plan of Care (Signed)
Problem: Bowel/Gastric: Goal: Will not experience complications related to bowel motility Outcome: Adequate for Discharge Changes to medications made per MD to prevent loose stools - educated on with DC instructions

## 2016-08-25 NOTE — Discharge Summary (Signed)
Physician Discharge Summary  Jon [name]:[id number] DOB: 1932-06-10 DOA: 08/18/2016  PCP: Milana Obey, MD  Admit date: 08/18/2016 Discharge date: 08/25/2016  Time spent: 45 minutes  Recommendations for Outpatient Follow-up:  -Will be discharged home today. -Advised to follow up with PCP in 2 weeks.   Discharge Diagnoses:  Active Problems:   Hypotension   UGI bleed   Pressure injury of skin   Gastric distention   Heme positive stool   Septic shock (HCC)   Goals of care, counseling/discussion   DNR (do not resuscitate) discussion   Palliative care encounter   N&V (nausea and vomiting)   Leukocytosis   Discharge Condition: Stable and improved  Filed Weights   08/21/16 0500 08/22/16 0500 08/24/16 0400  Weight: 60.4 kg (133 lb 2.5 oz) 61.3 kg (135 lb 2.3 oz) 61.3 kg (135 lb 2.3 oz)    History of present illness:  As per Dr. Sharl Ma on 3/4: Jon [name]  is a 83 y.o. male, With history of gastritis, paroxysmal atrial fibrillation, tachycardia-bradycardia syndrome, status post pacemaker placement, hypertension, status post diverting colostomy and indwelling Foley due to sacral ulcer was brought to hospital with several episodes of coffee-ground emesis at home. As per patient's son he has noticed that ostomy has not been draining as much over past few days. Patient denies abdominal pain. No chest pain or shortness of breath.  In the ED patient was found to be hypotensive with systolic blood pressure in 80s, there was a concern for possible UTI and also was started on vancomycin and Zosyn empirically. Hemoglobin has been stable at 15.8. Patient takes Coumadin for atrial fibrillation, today INR is 3.14.  Patient given 1 unit of FFP, vitamin K 5 mg IV 1. Started on IV Protonix. Phenergan when necessary for nausea vomiting. CT scan of the abdomen pelvis showed markedly distended stomach, multiple stones in the urinary bladder. GI consulted by ED  physician  Hospital Course:   Septic shock - Hypotension resolved, Has been weaned off pressors. - Follow on blood cultures, no growth on day 5 (final). - Chest x-ray with evidence of right lower lobe pneumonia, most likely aspiration pneumonia. -Transition to PO augmentin for 4 more days on DC. - Sacral  pressure ulcer appears to be healing nicely with no evidence of infection - Sepsis-most likely related toaspiration PNA,  -significant improvement overnight in leukocytosis, now down to 8.3 from a high of 34.4.  Gastric outlet obstruction - NGT removed. No N/V. -Appreciate GI input: to consider EGD once no longer on pressors. Patient most likely has gastroparesis. EGD in 11/16 without pyloric stenosis. -Has been started on a dysphagia 1 diet with nectar thick liquids per speech therapy recommendations. -Per GI, no EGD this admission.  Acute hypoxic respiratory failure -Likely due to aspiration PNA, -Improved, continue to wean oxygen as tolerated.  Aspiration pneumonia - Transition to PO augmentin for 4 more days.  Chronic diastolic CHF - Recent echo on 44/0102 with EF 65%, grade 2 diastolic dysfunction. -No signs of volume overload at present.  Atrial fibrillation/tachybradycardia syndrome - Patient with known history of A. Fib, on warfarin supratherapeutic INR on presentation of 3.14. - Restart sotalol as HR now in the 120-130s and BP can tolerate. -Anticoagulation has been held for now per patient's wish. I also think he is a relatively poor candidate regardless given his frailty puts him at a higher risk for falls.  History of hypertension - BP improved; now off pressors; resume sotalol given  elevated HRs.  Coagulopathy - Both therapeutic INR on admission 3.1 ,received  vitamin K and FFP, INR 1.46 on 3/7.  Hypokalemia - Replaced.  Protein calorie malnutrition -Ensure, dietitian following.  Pressure ulcer, Stage 1 - Appears to be healing nicely, almost  closed no discharge or evidence of infection on physical exam   Procedures:  None   Consultations:  GI  Discharge Instructions  Discharge Instructions    Increase activity slowly    Complete by:  As directed      Allergies as of 08/25/2016      Reactions   Iohexol     Desc: PT STATES THROAT/FACE SWELLING W/ IVP DYE IN 1990. PT HAS NOT HAD ANY EXAMS DONE W/DYE SINCE AND HAS NEVER BEEN PRE MEDICATED. PER PT, Onset Date: [id number]   Shellfish Allergy    Dilantin [phenytoin Sodium Extended] Rash   Tegretol [carbamazepine] Rash      Medication List    STOP taking these medications   diphenhydrAMINE 25 MG tablet Commonly known as:  BENADRYL   pantoprazole 40 MG tablet Commonly known as:  PROTONIX   potassium chloride SA 20 MEQ tablet Commonly known as:  K-DUR,KLOR-CON   warfarin 2.5 MG tablet Commonly known as:  COUMADIN     TAKE these medications   ALPRAZolam 1 MG tablet Commonly known as:  XANAX Take 0.5 tablets (0.5 mg total) by mouth 2 (two) times daily as needed for anxiety. What changed:  when to take this   amoxicillin-clavulanate 875-125 MG tablet Commonly known as:  AUGMENTIN Take 1 tablet by mouth every 12 (twelve) hours.   EYE DROPS OP Apply 2 drops to eye daily as needed (irritation).   HYDROcodone-acetaminophen 10-325 MG tablet Commonly known as:  NORCO Take 1 tablet by mouth every 6 (six) hours as needed for moderate pain.   ICAPS AREDS FORMULA PO Take 1 tablet by mouth daily.   lisinopril 10 MG tablet Commonly known as:  PRINIVIL,ZESTRIL Take 10 mg by mouth daily.   nystatin powder Commonly known as:  MYCOSTATIN/NYSTOP Apply 1 application topically daily.   sotalol 160 MG tablet Commonly known as:  BETAPACE Take 1 tablet (160 mg total) by mouth 2 (two) times daily. Need appointment for future refills      Allergies  Allergen Reactions  . Iohexol      Desc: PT STATES THROAT/FACE SWELLING W/ IVP DYE IN 1990. PT HAS NOT HAD ANY  EXAMS DONE W/DYE SINCE AND HAS NEVER BEEN PRE MEDICATED. PER PT, Onset Date: [id number]   . Shellfish Allergy   . Dilantin [Phenytoin Sodium Extended] Rash  . Tegretol [Carbamazepine] Rash   Follow-up Information    Milana Obey, MD. Schedule an appointment as soon as possible for a visit in 2 week(s).   Specialty:  Family Medicine Contact information: [address] [place] [address] [phone]            The results of significant diagnostics from this hospitalization (including imaging, microbiology, ancillary and laboratory) are listed below for reference.    Significant Diagnostic Studies: Ct Abdomen Pelvis Wo Contrast  Result Date: 08/18/2016 CLINICAL DATA:  Weakness, vomiting, and hypotension. EXAM: CT ABDOMEN AND PELVIS WITHOUT CONTRAST TECHNIQUE: Multidetector CT imaging of the abdomen and pelvis was performed following the standard protocol without IV contrast. COMPARISON:  CT scans since June 15, 2014 FINDINGS: Lower chest: The small left pleural effusion is decreased in size in the interval. The right-sided pleural effusion has resolved. There is fluid  in the distal esophagus. Coronary artery calcifications are noted. Mild dependent atelectasis. No acute abnormalities in the lung bases. Hepatobiliary: Cholelithiasis is identified. The liver is unchanged. Pancreas: Unremarkable. No pancreatic ductal dilatation or surrounding inflammatory changes. Spleen: Normal in size without focal abnormality. Adrenals/Urinary Tract: Multiple simple and hyperdense renal cysts are seen, unchanged. The largest is on the right measuring at least 11 cm in transverse dimension. Vascular calcifications are associated with the kidneys. There are also renal stones. The stone in the right renal pelvis is larger in the interval measuring 17 by 9 mm. There is no resulting hydronephrosis but there is mild increased attenuation of fat adjacent to the right renal pelvis. Remainder of the right  ureter is normal in caliber. No left-sided stones. The left ureter is normal. There is a Foley catheter in the bladder. Multiple stones are seen primarily dependently in the bladder. There is mild stranding adjacent to the bladder which could be seen with cystitis. This finding is similar in the interval however. Stomach/Bowel: The stomach is markedly distended. This finding is worsened in the interval. 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 The duodenum itself is normal in caliber. The remainder of the small bowel is normal in caliber as well. The patient is status post partial colectomy. There is a left lower quadrant ostomy. The remainder of the colon is unremarkable. Scattered diverticuli without diverticulitis. No evidence of appendicitis. Vascular/Lymphatic: Atherosclerotic change is seen in the non aneurysmal aorta. No adenopathy. Reproductive: Prostate is unremarkable. Other: Mild increased attenuation in the fat of the pericolic gutters is stable as is the mild increased attenuation anterior to the sacrum. Musculoskeletal: No interval bony changes identified. IMPRESSION: 1. Markedly distended stomach, worsened in the interval. This could be seen with gastric outlet obstruction or severe gastroparesis. 2. 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 3. Cholelithiasis. 4. Multiple renal cysts. The stone in the right renal pelvis has increased in the interval and there is now mild increased attenuation of fat adjacent to the right renal pelvis which could be due to intermittent obstruction. No hydronephrosis on today's study. 5. Multiple stones in the bladder. Mild fat stranding adjacent to the bladder could be due to cystitis. Recommend correlation with urinalysis to assess for UTI. 6. Atherosclerosis. 7. Diverticulosis. Electronically Signed   By: Gerome Sam III M.D   On: 08/18/2016 17:58   Dg Chest  Port 1 View  Result Date: 08/19/2016 CLINICAL DATA:  Hypoxia. EXAM: PORTABLE CHEST 1 VIEW COMPARISON:  08/18/2016 and 05/10/2016 FINDINGS: NG tube tip is in the left upper quadrant the abdomen. Pacemaker in place. There is a patchy infiltrate at the right lung base. Left lung is clear. Heart size and vascularity are normal. Calcification in the arch of the aorta. No acute bone abnormality. IMPRESSION: New patchy infiltrate at the right lung base. Aortic atherosclerosis. Electronically Signed   By: Francene Boyers M.D.   On: 08/19/2016 09:48   Dg Chest Port 1 View  Result Date: 08/18/2016 CLINICAL DATA:  Hypotension and vomiting today.  05/10/2016 EXAM: PORTABLE CHEST 1 VIEW COMPARISON:  None. FINDINGS: Left-sided transvenous pacemaker leads to the right atrium and right ventricle. Heart is mildly enlarged. Aorta is tortuous and partially calcified. Lungs are clear. No pulmonary edema. No free intraperitoneal air beneath the diaphragm. IMPRESSION: Stable cardiomegaly.  No evidence for acute pulmonary abnormality. Electronically Signed  By: Norva PavlovElizabeth  Brown M.D.   On: 08/18/2016 15:30   Dg Chest Port 1v Same Day  Result Date: 08/19/2016 CLINICAL DATA:  PICC line placement EXAM: PORTABLE CHEST 1 VIEW COMPARISON:  08/19/2016 FINDINGS: Left chest wall pacer device is noted with lead in the right atrial appendage and right ventricle. Normal heart size. Aortic atherosclerosis noted. The right arm PICC line tip is at the cavoatrial junction. Patchy infiltrate within the right base is unchanged from previous exam. Left lung is clear. IMPRESSION: 1. Right arm PICC line tip is at the cavoatrial junction. 2. Persistent right base infiltrate. Electronically Signed   By: Signa Kellaylor  Stroud M.D.   On: 08/19/2016 12:02   Dg Abd 2 Views  Result Date: 08/18/2016 CLINICAL DATA:  Nausea and vomiting. History of gastritis and hypertension. EXAM: ABDOMEN - 2 VIEW COMPARISON:  05/04/2016 FINDINGS: Two supine views. The more cephalad  imaged excludes the far left-sided abdomen. Nasogastric tube has been removed. Moderate-to-marked gaseous distension of the stomach. No gross free intraperitoneal air. No bowel distension. Osteopenia. Right hip osteoarthritis. IMPRESSION: Gaseous distension of the stomach, nonspecific.  CT pending. Electronically Signed   By: Jeronimo GreavesKyle  Talbot M.D.   On: 08/18/2016 16:57   Dg Swallowing Func-speech Pathology  Result Date: 08/22/2016 Objective Swallowing Evaluation: Type of Study: MBS-Modified Barium Swallow Study Patient Details Name: Jon [name] MRN: [id number] Date of Birth: Aug 20, 1931 Today's Date: 08/22/2016 Time: SLP Start Time (ACUTE ONLY): 1100-SLP Stop Time (ACUTE ONLY): 1145 SLP Time Calculation (min) (ACUTE ONLY): 45 min Past Medical History: Past Medical History: Diagnosis Date . Abnormality of gait  . Arthritis  . B12 deficiency  . Bilateral hydronephrosis  . Bladder calculi  . Bleeding ulcer  . Dysrhythmia  . Essential hypertension  . Essential tremor  . Gastritis  . Macular degeneration of left eye  . Memory loss  . Multiple rib fractures  . Neuropathy (HCC)  . OSA (obstructive sleep apnea)   Does not use CPAP . PAF (paroxysmal atrial fibrillation) (HCC)  . Presence of permanent cardiac pacemaker  . Tachycardia-bradycardia syndrome (HCC)   [employer] PPM - Dr. Royann Shiversroitoru . Varicose veins   Bilateral Past Surgical History: Past Surgical History: Procedure Laterality Date . BRAIN SURGERY   . CATARACT EXTRACTION Bilateral 2013 . COLON SURGERY   . COLOSTOMY N/A 07/11/2014  Procedure: DIVERTING COLOSTOMY;  Surgeon: Dalia HeadingMark A Jenkins, MD;  Location: [facility];  Service: General;  Laterality: N/A;  wound class: clean contaminated . ESOPHAGOGASTRODUODENOSCOPY N/A 05/16/2015  Dr. Darrick PennaFields: few linear erosions/ulcerastions in distal esophagus, non-erosive gastritis, no duodenal abnormalities. Path with chronic gastritis, negative H.pylori.  . INCISION AND DRAINAGE OF WOUND N/A 07/11/2014  Procedure: DEBRIDEMENT OF SACRUM;   Surgeon: Dalia HeadingMark A Jenkins, MD;  Location: [facility];  Service: General;  Laterality: N/A; . INSERT / REPLACE / REMOVE PACEMAKER   . MENINGIOMA REMOVAL  1992  POST RT FRONTAL  . PACEMAKER INSERTION  2006 . PERMANENT PACEMAKER GENERATOR CHANGE N/A 08/13/2012  Procedure: PERMANENT PACEMAKER GENERATOR CHANGE;  Surgeon: Thurmon FairMihai Croitoru, MD;  Location: [facility];  Service: Cardiovascular;  Laterality: N/A; HPI: 83 y.o. male, with a past medical history significant for PAF On warfarin , tachybradycardia syndrome status post pacemaker, chronic diastolic CHF, neuropathy, OSA, HTN, anxiety, s/p diverting colostomy & indwelling Foley due to Sacral Ulcer, and ambulatory at bedside, bedbound, patient was brought to the hospital due to several episodes of coffee-ground emesis, patient with known colostomy has not been draining much for few days, CT abdomen  pelvis with markedly distended stomach possible gastric outlet obstruction versus severe gastroparesis, as well multiple stones in the urinary bladder possible cystitis, patient was found to have septic shock, required to ICU and started on pressors. Pt had NG placed (now removed for suction). BSE ordered. Pt with RLL PNA likely from emesis and possible aspiration prior to admission.  Subjective: "ok" Assessment / Plan / Recommendation CHL IP CLINICAL IMPRESSIONS 08/22/2016 Clinical Impression Pt assessed in the lateral position with barium tinged thin, puree, regular textures, barium tablet with thin, and nectar-thick liquids. Pt presents with moderate oropharyngeal phase dysphagia characterized by delayed oral transit, impaired lingual movement, delayed AP transit, piecemeal deglutition, delay in swallow initiation with swallow trigger after spilling to pyriforms across all liquids and mech soft textures, decreased tongue base retraction, decreased epiglottic deflection, and decreased laryngeal vestibule closure resulting in significant vallecular and pyriform residue after the  swallow. Pt with penetration of thin liquids from vallecular residue trickling down into laryngeal vestibule after the swallow. Pt does produce throat clear when liquids reach vocal folds (not always completely cleared from laryngeal vestibule, but does clear from vocal folds). Pt also demonstrated penetration of thin liquids before the swallow and trace aspiration after the swallow when taking straw sips of thin liquid with barium tablet (this appeared to be removed, however Pt's shoulders limit viewing). Pt is at moderate to mod/severe risk for aspiration given the amount of vallecular and pyriform residue post swallow across consistencies and textures (primarily due to weakness of oropharyngeal structures). Cued cough and cues to repeat/dry swallow were effective in reducing pharyngeal residue, however not completely eliminated. Pt would also be at risk for aspiration of his saliva/secretions. Recommend puree vs D2/chopped (per GI) and thin liquids with strict aspiration and reflux precautions: Pt to sit fully upright for all eating/drinking, swallow 2-3x for each bite/sip, cough/clear throat every few bites/sips and repeat swallow. Suspect that Pt will likely cough and present with wet vocal quality after meals due to residuals in pharynx. He will need cues to "keep swallowing". Pt may have local inflammation near pyriforms (difficult to visualize) and pt reports sore throat from NG recently removed. Also noted likely osteophyte protruding into pharyngeal space near C5-6, however this did not lead to aspiration. SLP to follow during acute setting. Recommend follow up dysphagia therapy to focus on strengthening oropharyngeal structures if Pt willing/able to particpate. SLP Visit Diagnosis Dysphagia, oropharyngeal phase (R13.12) Attention and concentration deficit following -- Frontal lobe and executive function deficit following -- Impact on safety and function Moderate aspiration risk   CHL IP TREATMENT  RECOMMENDATION 08/22/2016 Treatment Recommendations Therapy as outlined in treatment plan below   Prognosis 08/22/2016 Prognosis for Safe Diet Advancement Fair Barriers to Reach Goals Severity of deficits Barriers/Prognosis Comment -- [facility] IP DIET RECOMMENDATION 08/22/2016 SLP Diet Recommendations Dysphagia 2 (Fine chop) solids;Thin liquid;Dysphagia 1 (Puree) solids Liquid Administration via Cup;Straw Medication Administration Whole meds with puree Compensations Slow rate;Small sips/bites;Multiple dry swallows after each bite/sip;Clear throat intermittently;Effortful swallow Postural Changes Remain semi-upright after after feeds/meals (Comment);Seated upright at 90 degrees   CHL IP OTHER RECOMMENDATIONS 08/22/2016 Recommended Consults -- Oral Care Recommendations Oral care BID;Staff/trained caregiver to provide oral care Other Recommendations Clarify dietary restrictions   [facility] IP FOLLOW UP RECOMMENDATIONS 08/22/2016 Follow up Recommendations Skilled Nursing facility   [facility] IP FREQUENCY AND DURATION 08/22/2016 Speech Therapy Frequency (ACUTE ONLY) min 2x/week Treatment Duration 1 week      CH[facility] IP ORAL PHASE 08/22/2016 Oral Phase Impaired Oral -  Pudding Teaspoon -- Oral - Pudding Cup -- Oral - Honey Teaspoon -- Oral - Honey Cup -- Oral - Nectar Teaspoon -- Oral - Nectar Cup -- Oral - Nectar Straw -- Oral - Thin Teaspoon -- Oral - Thin Cup -- Oral - Thin Straw -- Oral - Puree -- Oral - Mech Soft -- Oral - Regular Weak lingual manipulation;Impaired mastication;Piecemeal swallowing;Delayed oral transit Oral - Multi-Consistency -- Oral - Pill -- Oral Phase - Comment --  CHL IP PHARYNGEAL PHASE 08/22/2016 Pharyngeal Phase Impaired Pharyngeal- Pudding Teaspoon -- Pharyngeal -- Pharyngeal- Pudding Cup -- Pharyngeal -- Pharyngeal- Honey Teaspoon -- Pharyngeal -- Pharyngeal- Honey Cup -- Pharyngeal -- Pharyngeal- Nectar Teaspoon -- Pharyngeal -- Pharyngeal- Nectar Cup -- Pharyngeal -- Pharyngeal- Nectar Straw Delayed swallow  initiation-vallecula;Reduced epiglottic inversion;Reduced tongue base retraction;Penetration/Apiration after swallow;Pharyngeal residue - valleculae;Pharyngeal residue - pyriform;Lateral channel residue Pharyngeal Material enters airway, remains ABOVE vocal cords then ejected out;Material does not enter airway Pharyngeal- Thin Teaspoon -- Pharyngeal -- Pharyngeal- Thin Cup Delayed swallow initiation-vallecula;Delayed swallow initiation-pyriform sinuses;Reduced epiglottic inversion;Reduced tongue base retraction;Penetration/Aspiration during swallow;Pharyngeal residue - valleculae;Pharyngeal residue - pyriform;Lateral channel residue Pharyngeal Material does not enter airway;Material enters airway, remains ABOVE vocal cords then ejected out;Material enters airway, remains ABOVE vocal cords and not ejected out Pharyngeal- Thin Straw Delayed swallow initiation-vallecula;Delayed swallow initiation-pyriform sinuses;Reduced epiglottic inversion;Reduced tongue base retraction;Penetration/Aspiration during swallow;Pharyngeal residue - valleculae;Pharyngeal residue - pyriform;Lateral channel residue Pharyngeal Material enters airway, remains ABOVE vocal cords then ejected out;Material does not enter airway Pharyngeal- Puree Delayed swallow initiation-vallecula;Reduced epiglottic inversion;Reduced tongue base retraction;Pharyngeal residue - valleculae;Pharyngeal residue - pyriform Pharyngeal -- Pharyngeal- Mechanical Soft -- Pharyngeal -- Pharyngeal- Regular Delayed swallow initiation-pyriform sinuses;Reduced epiglottic inversion;Reduced tongue base retraction;Pharyngeal residue - valleculae;Pharyngeal residue - pyriform Pharyngeal -- Pharyngeal- Multi-consistency -- Pharyngeal -- Pharyngeal- Pill Delayed swallow initiation-vallecula;Reduced epiglottic inversion;Reduced airway/laryngeal closure;Reduced tongue base retraction;Penetration/Aspiration before swallow;Penetration/Apiration after swallow;Trace aspiration;Pharyngeal  residue - valleculae;Pharyngeal residue - pyriform;Lateral channel residue Pharyngeal Material enters airway, passes BELOW cords then ejected out Pharyngeal Comment --  CHL IP CERVICAL ESOPHAGEAL PHASE 08/22/2016 Cervical Esophageal Phase (No Data) Pudding Teaspoon -- Pudding Cup -- Honey Teaspoon -- Honey Cup -- Nectar Teaspoon -- Nectar Cup -- Nectar Straw -- Thin Teaspoon -- Thin Cup -- Thin Straw -- Puree -- Mechanical Soft -- Regular -- Multi-consistency -- Pill -- Cervical Esophageal Comment -- No flowsheet data found. Thank you, Havery Moros, CCC-SLP [phone] PORTER,DABNEY 08/22/2016, 7:56 PM               Microbiology: Recent Results (from the past 240 hour(s))  Blood Culture (routine x 2)     Status: None   Collection Time: 08/18/16  3:23 PM  Result Value Ref Range Status   Specimen Description LEFT ANTECUBITAL  Final   Special Requests BOTTLES DRAWN AEROBIC AND ANAEROBIC [facility] EACH  Final   Culture NO GROWTH 5 DAYS  Final   Report Status 08/23/2016 FINAL  Final  Blood Culture (routine x 2)     Status: None   Collection Time: 08/18/16  3:53 PM  Result Value Ref Range Status   Specimen Description LEFT ANTECUBITAL  Final   Special Requests BOTTLES DRAWN AEROBIC AND ANAEROBIC 8CC EACH  Final   Culture NO GROWTH 5 DAYS  Final   Report Status 08/23/2016 FINAL  Final  MRSA PCR Screening     Status: Abnormal   Collection Time: 08/18/16  8:19 PM  Result Value Ref Range Status   MRSA by PCR POSITIVE (A) NEGATIVE Final  Comment:        The GeneXpert MRSA Assay (FDA approved for NASAL specimens only), is one component of a comprehensive MRSA colonization surveillance program. It is not intended to diagnose MRSA infection nor to guide or monitor treatment for MRSA infections. RESULT CALLED TO, READ BACK BY AND VERIFIED WITH:  HEARN,J @ 2342 ON 08/18/16 BY JUW   Urine culture     Status: Abnormal   Collection Time: 08/19/16 12:00 PM  Result Value Ref Range Status   Specimen  Description URINE, CATHETERIZED  Final   Special Requests NONE  Final   Culture MULTIPLE SPECIES PRESENT, SUGGEST RECOLLECTION (A)  Final   Report Status 08/21/2016 FINAL  Final     Labs: Basic Metabolic Panel:  Recent Labs Lab 08/21/16 0414 08/22/16 0419 08/23/16 0402 08/24/16 0445 08/25/16 0520  NA 137 138 139 137 134*  K 3.7 4.7 4.6 5.2* 4.3  CL 104 109 112* 110 107  CO2 27 24 21* 22 23  GLUCOSE 92 90 142* 94 84  BUN 26* 18 14 12 12   CREATININE 0.86 0.91 0.69 0.59* 0.59*  CALCIUM 8.0* 8.1* 8.0* 8.0* 7.8*  MG 1.5*  --   --   --   --   PHOS 2.3*  --   --   --   --    Liver Function Tests:  Recent Labs Lab 08/19/16 0317  AST 25  ALT 12*  ALKPHOS 61  BILITOT 0.8  PROT 6.5  ALBUMIN 3.5   No results for input(s): LIPASE, AMYLASE in the last 168 hours. No results for input(s): AMMONIA in the last 168 hours. CBC:  Recent Labs Lab 08/21/16 0414 08/22/16 0419 08/23/16 0402 08/24/16 0445 08/25/16 0520  WBC 18.5* 11.5* 10.3 8.3 5.5  NEUTROABS  --   --   --  6.7  --   HGB 9.6* 9.6* 9.1* 9.2* 8.7*  HCT 28.5* 29.1* 27.5* 27.3* 26.3*  MCV 85.3 85.8 86.2 85.3 84.3  PLT 150 120* 124* 132* 144*   Cardiac Enzymes: No results for input(s): CKTOTAL, CKMB, CKMBINDEX, TROPONINI in the last 168 hours. BNP: BNP (last 3 results) No results for input(s): BNP in the last 8760 hours.  ProBNP (last 3 results) No results for input(s): PROBNP in the last 8760 hours.  CBG:  Recent Labs Lab 08/22/16 0018 08/22/16 1249 08/22/16 1835 08/23/16 0051 08/23/16 0645  GLUCAP 91 84 128* 114* 129*       Signed:  HERNANDEZ ACOSTA,ESTELA  [facility] Pager: [phone] 08/25/2016, 5:28 PM

## 2016-09-03 ENCOUNTER — Ambulatory Visit (INDEPENDENT_AMBULATORY_CARE_PROVIDER_SITE_OTHER): Payer: Medicare Other | Admitting: *Deleted

## 2016-09-03 DIAGNOSIS — I480 Paroxysmal atrial fibrillation: Secondary | ICD-10-CM

## 2016-09-04 ENCOUNTER — Encounter: Payer: Self-pay | Admitting: Cardiology

## 2016-09-04 NOTE — Progress Notes (Signed)
Remote pacemaker transmission.   

## 2016-09-05 LAB — CUP PACEART REMOTE DEVICE CHECK
Battery Impedance: 429 Ohm
Brady Statistic AS VS Percent: 8 %
Date Time Interrogation Session: 20180321015939
Implantable Lead Implant Date: 20060815
Implantable Lead Location: 753860
Implantable Pulse Generator Implant Date: 20140227
Lead Channel Impedance Value: 547 Ohm
Lead Channel Pacing Threshold Amplitude: 0.875 V
Lead Channel Pacing Threshold Pulse Width: 0.4 ms
Lead Channel Setting Pacing Amplitude: 2.5 V
Lead Channel Setting Pacing Pulse Width: 0.4 ms
Lead Channel Setting Sensing Sensitivity: 2 mV
MDC IDC LEAD IMPLANT DT: 20060815
MDC IDC LEAD LOCATION: 753859
MDC IDC MSMT BATTERY REMAINING LONGEVITY: 74 mo
MDC IDC MSMT BATTERY VOLTAGE: 2.78 V
MDC IDC MSMT LEADCHNL RA IMPEDANCE VALUE: 357 Ohm
MDC IDC MSMT LEADCHNL RA PACING THRESHOLD AMPLITUDE: 1.625 V
MDC IDC MSMT LEADCHNL RA PACING THRESHOLD PULSEWIDTH: 0.4 ms
MDC IDC SET LEADCHNL RA PACING AMPLITUDE: 3.25 V
MDC IDC STAT BRADY AP VP PERCENT: 4 %
MDC IDC STAT BRADY AP VS PERCENT: 85 %
MDC IDC STAT BRADY AS VP PERCENT: 3 %

## 2016-09-11 ENCOUNTER — Telehealth: Payer: Self-pay | Admitting: Cardiovascular Disease

## 2016-09-11 NOTE — Telephone Encounter (Signed)
Closed Encounter  °

## 2016-12-04 ENCOUNTER — Encounter: Payer: Medicare Other | Admitting: *Deleted

## 2016-12-06 ENCOUNTER — Encounter: Payer: Self-pay | Admitting: Cardiology

## 2016-12-27 ENCOUNTER — Other Ambulatory Visit (HOSPITAL_COMMUNITY)
Admission: RE | Admit: 2016-12-27 | Discharge: 2016-12-27 | Disposition: A | Discharge status: Home / Self Care | Payer: Medicare Other | Source: Other Acute Inpatient Hospital | Attending: Family Medicine | Admitting: Family Medicine

## 2016-12-27 DIAGNOSIS — F419 Anxiety disorder, unspecified: Secondary | ICD-10-CM | POA: Diagnosis present

## 2016-12-27 DIAGNOSIS — I10 Essential (primary) hypertension: Secondary | ICD-10-CM | POA: Diagnosis present

## 2016-12-27 DIAGNOSIS — Z7901 Long term (current) use of anticoagulants: Secondary | ICD-10-CM | POA: Insufficient documentation

## 2016-12-27 LAB — COMPREHENSIVE METABOLIC PANEL
ALT: 9 U/L — AB (ref 17–63)
AST: 17 U/L (ref 15–41)
Albumin: 3.3 g/dL — ABNORMAL LOW (ref 3.5–5.0)
Alkaline Phosphatase: 65 U/L (ref 38–126)
Anion gap: 9 (ref 5–15)
BUN: 17 mg/dL (ref 6–20)
CHLORIDE: 93 mmol/L — AB (ref 101–111)
CO2: 25 mmol/L (ref 22–32)
CREATININE: 0.66 mg/dL (ref 0.61–1.24)
Calcium: 8.6 mg/dL — ABNORMAL LOW (ref 8.9–10.3)
GFR calc Af Amer: >60 mL/min (ref >60)
GFR calc non Af Amer: >60 mL/min (ref >60)
Glucose, Bld: 90 mg/dL (ref 65–99)
Potassium: 4 mmol/L (ref 3.5–5.1)
SODIUM: 127 mmol/L — AB (ref 135–145)
Total Bilirubin: 0.8 mg/dL (ref 0.3–1.2)
Total Protein: 6.5 g/dL (ref 6.5–8.1)

## 2016-12-27 LAB — LIPID PANEL
Cholesterol: 136 mg/dL (ref 0–200)
HDL: 33 mg/dL — ABNORMAL LOW (ref >40)
LDL CALC: 86 mg/dL (ref 0–99)
Total CHOL/HDL Ratio: 4.1 RATIO
Triglycerides: 83 mg/dL (ref <150)
VLDL: 17 mg/dL (ref 0–40)

## 2016-12-27 LAB — CBC WITH DIFFERENTIAL/PLATELET
Basophils Absolute: 0 10*3/uL (ref 0.0–0.1)
Basophils Relative: 0 %
EOS ABS: 0.2 10*3/uL (ref 0.0–0.7)
EOS PCT: 4 %
HCT: 37.8 % — ABNORMAL LOW (ref 39.0–52.0)
Hemoglobin: 12.9 g/dL — ABNORMAL LOW (ref 13.0–17.0)
LYMPHS PCT: 24 %
Lymphs Abs: 1.4 10*3/uL (ref 0.7–4.0)
MCH: 28.7 pg (ref 26.0–34.0)
MCHC: 34.1 g/dL (ref 30.0–36.0)
MCV: 84 fL (ref 78.0–100.0)
Monocytes Absolute: 0.5 10*3/uL (ref 0.1–1.0)
Monocytes Relative: 9 %
NEUTROS PCT: 63 %
Neutro Abs: 3.8 10*3/uL (ref 1.7–7.7)
PLATELETS: 204 10*3/uL (ref 150–400)
RBC: 4.5 MIL/uL (ref 4.22–5.81)
RDW: 14.6 % (ref 11.5–15.5)
WBC: 6 10*3/uL (ref 4.0–10.5)

## 2017-01-22 ENCOUNTER — Other Ambulatory Visit (HOSPITAL_COMMUNITY)
Admission: RE | Admit: 2017-01-22 | Discharge: 2017-01-22 | Disposition: A | Discharge status: Home / Self Care | Payer: Medicare Other | Source: Other Acute Inpatient Hospital | Attending: Family Medicine | Admitting: Family Medicine

## 2017-01-22 DIAGNOSIS — N390 Urinary tract infection, site not specified: Secondary | ICD-10-CM | POA: Insufficient documentation

## 2017-01-22 LAB — URINALYSIS, ROUTINE W REFLEX MICROSCOPIC
Bilirubin Urine: NEGATIVE
Glucose, UA: NEGATIVE mg/dL
Ketones, ur: NEGATIVE mg/dL
Nitrite: NEGATIVE
Specific Gravity, Urine: 1.01 (ref 1.005–1.030)
pH: 8 (ref 5.0–8.0)

## 2017-03-12 ENCOUNTER — Other Ambulatory Visit (HOSPITAL_COMMUNITY)
Admission: AD | Admit: 2017-03-12 | Discharge: 2017-03-12 | Disposition: A | Discharge status: Home / Self Care | Payer: Medicare Other | Source: Skilled Nursing Facility | Attending: Family Medicine | Admitting: Family Medicine

## 2017-03-12 DIAGNOSIS — Z87440 Personal history of urinary (tract) infections: Secondary | ICD-10-CM | POA: Diagnosis present

## 2017-03-12 LAB — URINALYSIS, ROUTINE W REFLEX MICROSCOPIC
BILIRUBIN URINE: NEGATIVE
Glucose, UA: NEGATIVE mg/dL
HGB URINE DIPSTICK: NEGATIVE
KETONES UR: NEGATIVE mg/dL
Nitrite: NEGATIVE
Protein, ur: >=300 mg/dL — AB
SPECIFIC GRAVITY, URINE: 1.014 (ref 1.005–1.030)
SQUAMOUS EPITHELIAL / LPF: NONE SEEN
pH: 7 (ref 5.0–8.0)

## 2017-03-15 LAB — URINE CULTURE

## 2017-05-01 IMAGING — CT CT ABD-PELV W/O CM
2 of 4 series · 15 of 46 positions shown, 17 images · non-contrast
Comparison: CT scans since June 15, 2014

CLINICAL DATA: Weakness, vomiting, and hypotension.

EXAM:
CT ABDOMEN AND PELVIS WITHOUT CONTRAST
TECHNIQUE: Multidetector CT imaging of the abdomen and pelvis was performed
following the standard protocol without IV contrast.

[Series 2: axial st · axial · 0.75mm/px · z∈[-240,+155]mm · 12 of 95 slices shown, 14 images]
[im 8/95  soft-tissue]
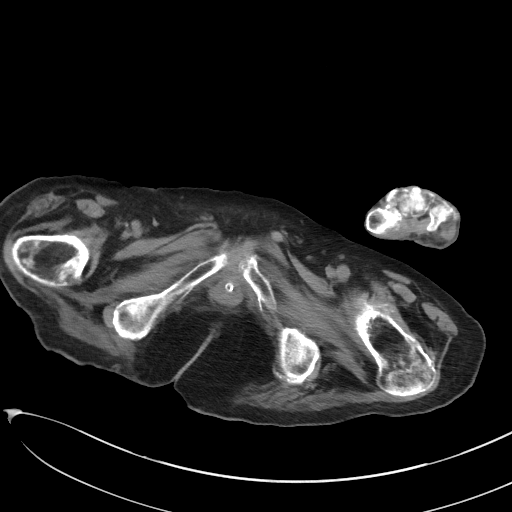
[im 8/95  bone]
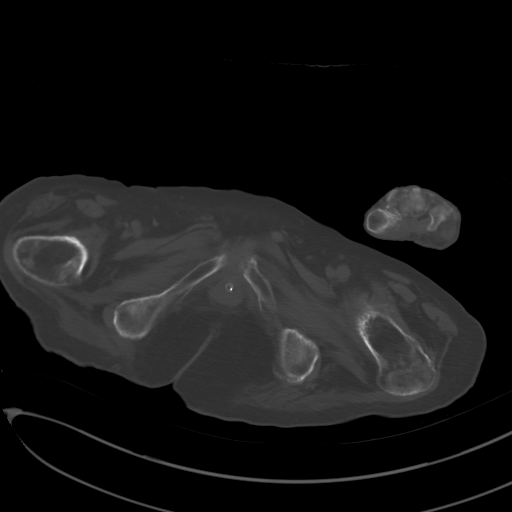
[im 15/95  soft-tissue]
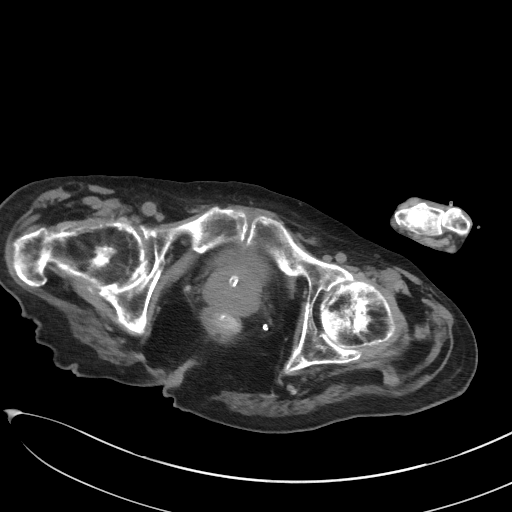
[im 22/95  soft-tissue]
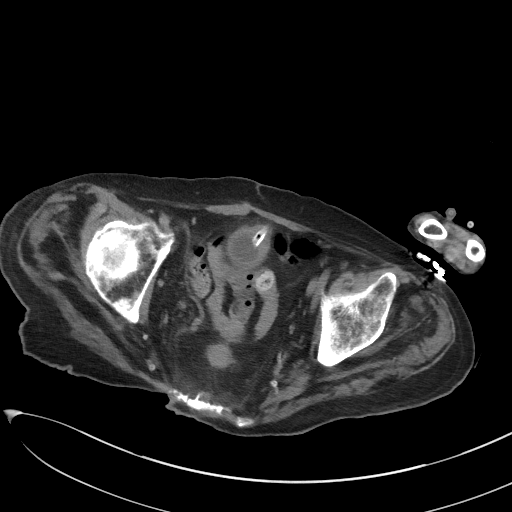
[im 29/95  soft-tissue]
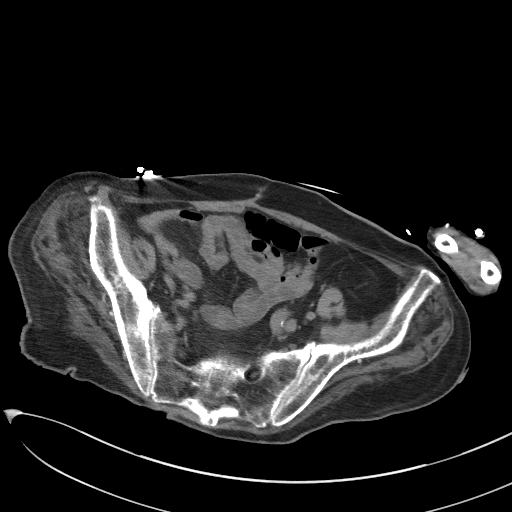
[im 37/95  soft-tissue]
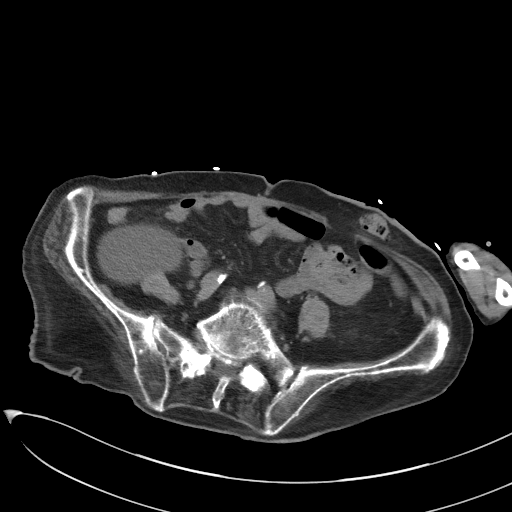
[im 44/95  soft-tissue]
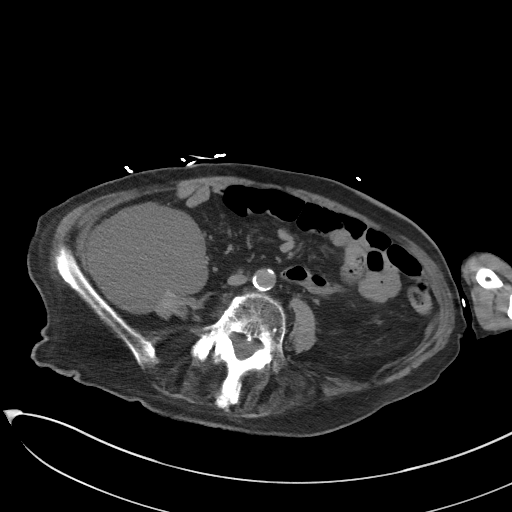
[im 51/95  soft-tissue]
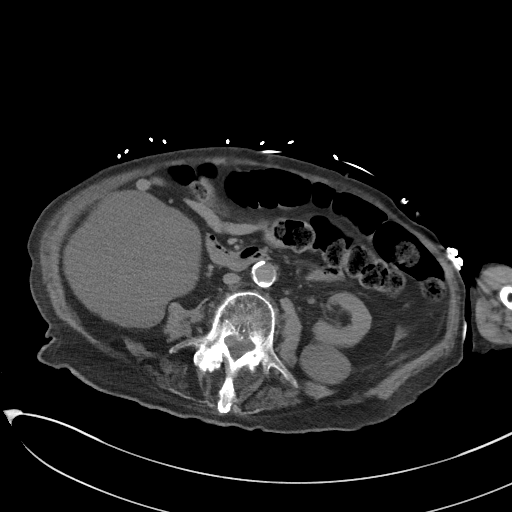
[im 58/95  soft-tissue]
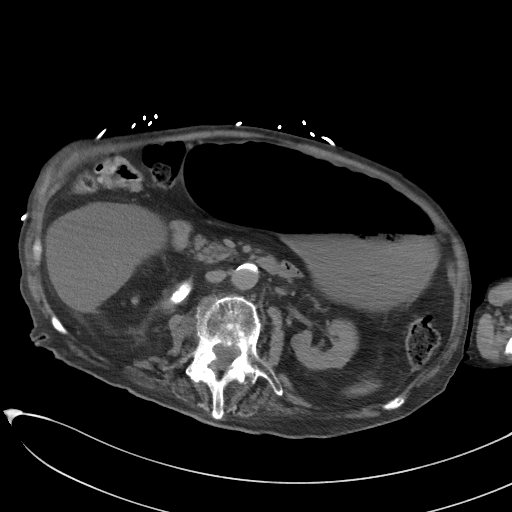
[im 66/95  soft-tissue]
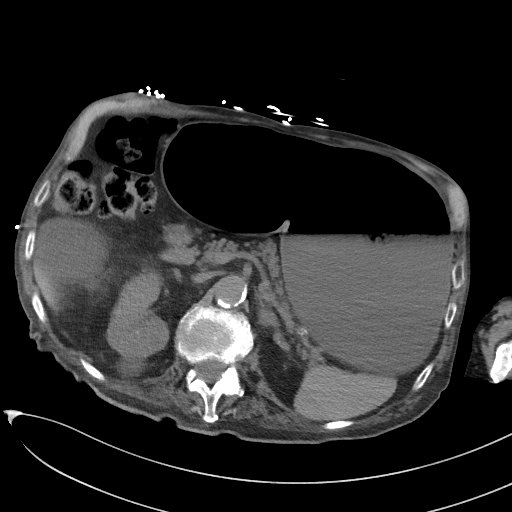
[im 66/95  bone]
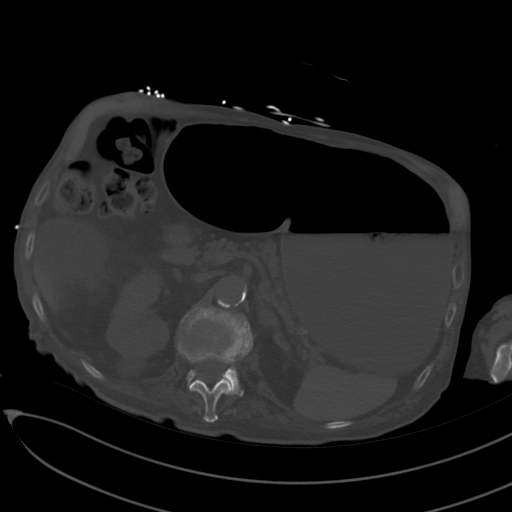
[im 73/95  soft-tissue]
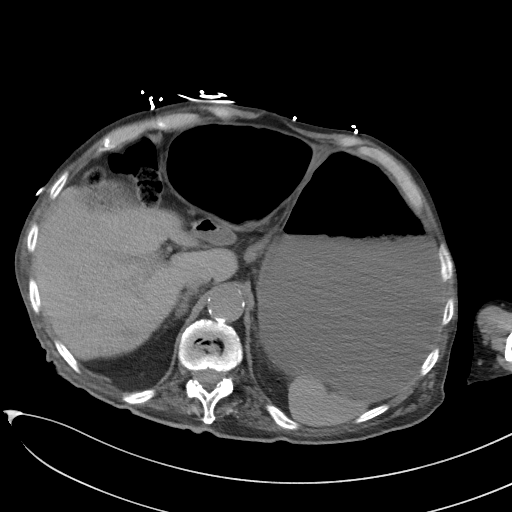
[im 80/95  soft-tissue]
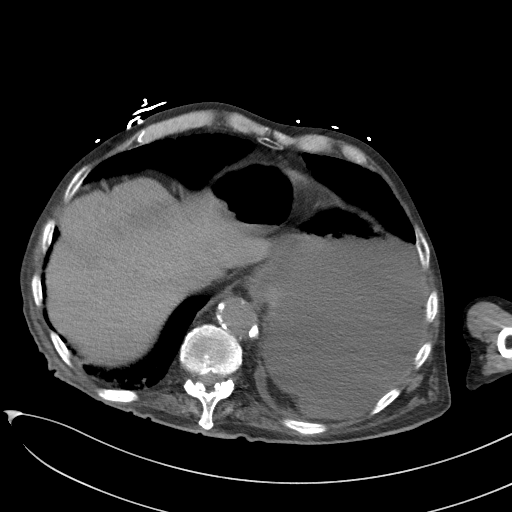
[im 87/95  soft-tissue]
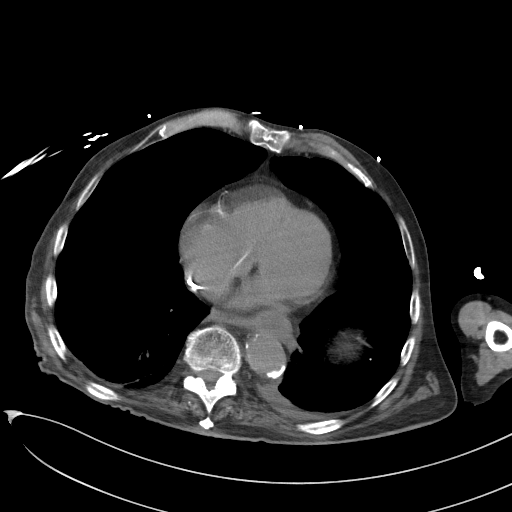

[Series 4: coronal st · coronal · 0.74mm/px · 3 of 80 slices shown]
[im 27/80  soft-tissue]
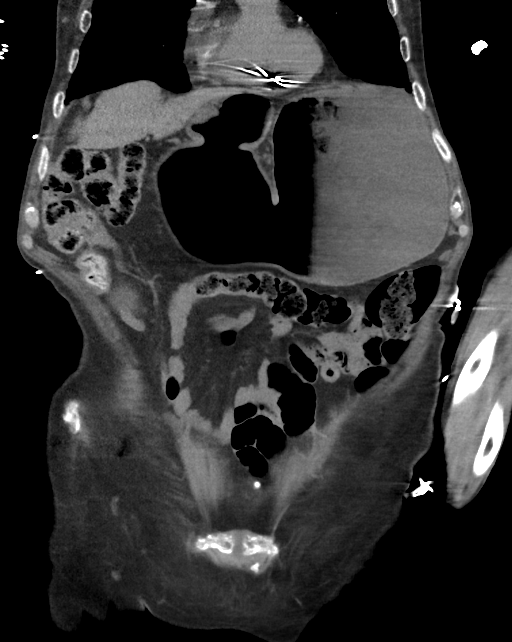
[im 36/80  soft-tissue]
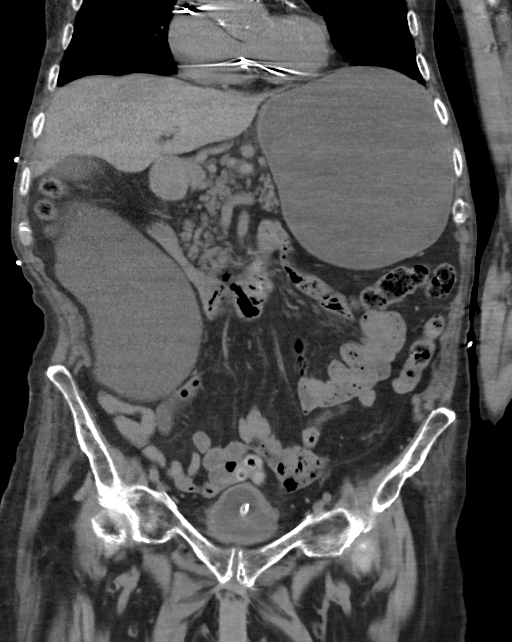
[im 44/80  soft-tissue]
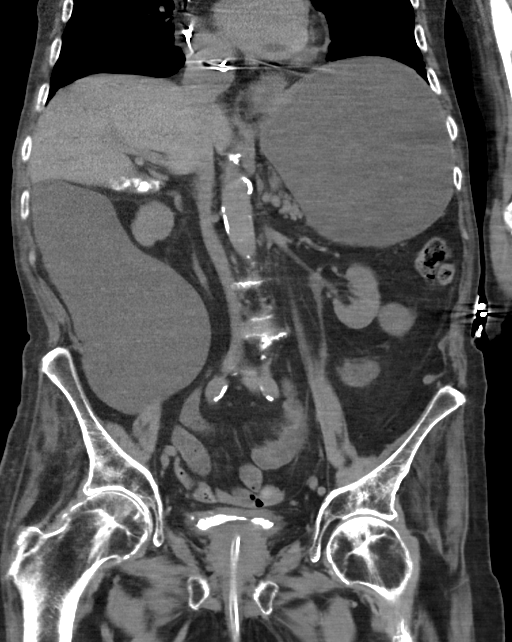

[15 of 46 positions shown; findings below may reference images not displayed]

FINDINGS: Lower chest: The small left pleural effusion is decreased in size in
the interval. The right-sided pleural effusion has resolved. There
is fluid in the distal esophagus. Coronary artery calcifications are
noted. Mild dependent atelectasis. No acute abnormalities in the
lung bases.

Hepatobiliary: Cholelithiasis is identified. The liver is unchanged.

Pancreas: Unremarkable. No pancreatic ductal dilatation or
surrounding inflammatory changes.

Spleen: Normal in size without focal abnormality.

Adrenals/Urinary Tract: Multiple simple and hyperdense renal cysts
are seen, unchanged. The largest is on the right measuring at least
11 cm in transverse dimension. Vascular calcifications are
associated with the kidneys. There are also renal stones. The stone
in the right renal pelvis is larger in the interval measuring 17 by
9 mm. There is no resulting hydronephrosis but there is mild
increased attenuation of fat adjacent to the right renal pelvis.
Remainder of the right ureter is normal in caliber. No left-sided
stones. The left ureter is normal. There is a Foley catheter in the
bladder. Multiple stones are seen primarily dependently in the
bladder. There is mild stranding adjacent to the bladder which could
be seen with cystitis. This finding is similar in the interval
however.

Stomach/Bowel: The stomach is markedly distended. This finding is
worsened in the interval. The 3 cm mass adjacent to the duodenal
bulb is stable since 7097. The duodenum itself is normal in caliber.
The remainder of the small bowel is normal in caliber as well. The
patient is status post partial colectomy. There is a left lower
quadrant ostomy. The remainder of the colon is unremarkable.
Scattered diverticuli without diverticulitis. No evidence of
appendicitis.

Vascular/Lymphatic: Atherosclerotic change is seen in the non
aneurysmal aorta. No adenopathy.

Reproductive: Prostate is unremarkable.

Other: Mild increased attenuation in the fat of the pericolic
gutters is stable as is the mild increased attenuation anterior to
the sacrum.

Musculoskeletal: No interval bony changes identified.
IMPRESSION: 1. Markedly distended stomach, worsened in the interval. This could
be seen with gastric outlet obstruction or severe gastroparesis.
2. The 3 cm mass adjacent to the duodenal bulb is stable since 7097.
This is nonspecific but may represent a gist tumor.
3. Cholelithiasis.
4. Multiple renal cysts. The stone in the right renal pelvis has
increased in the interval and there is now mild increased
attenuation of fat adjacent to the right renal pelvis which could be
due to intermittent obstruction. No hydronephrosis on today's study.
5. Multiple stones in the bladder. Mild fat stranding adjacent to
the bladder could be due to cystitis. Recommend correlation with
urinalysis to assess for UTI.
6. Atherosclerosis.
7. Diverticulosis.

## 2017-05-27 ENCOUNTER — Encounter (HOSPITAL_COMMUNITY): Payer: Self-pay | Admitting: Cardiology

## 2017-05-27 ENCOUNTER — Emergency Department (HOSPITAL_COMMUNITY): Payer: Medicare Other

## 2017-05-27 ENCOUNTER — Inpatient Hospital Stay (HOSPITAL_COMMUNITY)
Admission: EM | Admit: 2017-05-27 | Discharge: 2017-06-05 | DRG: 871 | Disposition: A | Discharge status: Hospice | Payer: Medicare Other | Attending: Internal Medicine | Admitting: Internal Medicine

## 2017-05-27 ENCOUNTER — Other Ambulatory Visit: Payer: Self-pay

## 2017-05-27 DIAGNOSIS — I959 Hypotension, unspecified: Secondary | ICD-10-CM | POA: Diagnosis not present

## 2017-05-27 DIAGNOSIS — R0609 Other forms of dyspnea: Secondary | ICD-10-CM | POA: Diagnosis not present

## 2017-05-27 DIAGNOSIS — R6520 Severe sepsis without septic shock: Secondary | ICD-10-CM | POA: Diagnosis present

## 2017-05-27 DIAGNOSIS — F0390 Unspecified dementia without behavioral disturbance: Secondary | ICD-10-CM | POA: Diagnosis present

## 2017-05-27 DIAGNOSIS — L8993 Pressure ulcer of unspecified site, stage 3: Secondary | ICD-10-CM

## 2017-05-27 DIAGNOSIS — E876 Hypokalemia: Secondary | ICD-10-CM | POA: Diagnosis not present

## 2017-05-27 DIAGNOSIS — L98429 Non-pressure chronic ulcer of back with unspecified severity: Secondary | ICD-10-CM | POA: Diagnosis present

## 2017-05-27 DIAGNOSIS — E860 Dehydration: Secondary | ICD-10-CM | POA: Diagnosis not present

## 2017-05-27 DIAGNOSIS — G629 Polyneuropathy, unspecified: Secondary | ICD-10-CM | POA: Diagnosis present

## 2017-05-27 DIAGNOSIS — L89892 Pressure ulcer of other site, stage 2: Secondary | ICD-10-CM | POA: Diagnosis present

## 2017-05-27 DIAGNOSIS — E43 Unspecified severe protein-calorie malnutrition: Secondary | ICD-10-CM | POA: Diagnosis present

## 2017-05-27 DIAGNOSIS — E875 Hyperkalemia: Secondary | ICD-10-CM | POA: Diagnosis present

## 2017-05-27 DIAGNOSIS — L89210 Pressure ulcer of right hip, unstageable: Secondary | ICD-10-CM | POA: Diagnosis present

## 2017-05-27 DIAGNOSIS — L89154 Pressure ulcer of sacral region, stage 4: Secondary | ICD-10-CM | POA: Diagnosis present

## 2017-05-27 DIAGNOSIS — Z950 Presence of cardiac pacemaker: Secondary | ICD-10-CM | POA: Diagnosis not present

## 2017-05-27 DIAGNOSIS — I5032 Chronic diastolic (congestive) heart failure: Secondary | ICD-10-CM | POA: Diagnosis present

## 2017-05-27 DIAGNOSIS — Z5189 Encounter for other specified aftercare: Secondary | ICD-10-CM | POA: Diagnosis not present

## 2017-05-27 DIAGNOSIS — Z79899 Other long term (current) drug therapy: Secondary | ICD-10-CM

## 2017-05-27 DIAGNOSIS — Z7189 Other specified counseling: Secondary | ICD-10-CM | POA: Diagnosis not present

## 2017-05-27 DIAGNOSIS — R680 Hypothermia, not associated with low environmental temperature: Secondary | ICD-10-CM | POA: Diagnosis present

## 2017-05-27 DIAGNOSIS — E871 Hypo-osmolality and hyponatremia: Secondary | ICD-10-CM

## 2017-05-27 DIAGNOSIS — Z87891 Personal history of nicotine dependence: Secondary | ICD-10-CM

## 2017-05-27 DIAGNOSIS — D696 Thrombocytopenia, unspecified: Secondary | ICD-10-CM | POA: Diagnosis not present

## 2017-05-27 DIAGNOSIS — Z6822 Body mass index (BMI) 22.0-22.9, adult: Secondary | ICD-10-CM

## 2017-05-27 DIAGNOSIS — I110 Hypertensive heart disease with heart failure: Secondary | ICD-10-CM | POA: Diagnosis present

## 2017-05-27 DIAGNOSIS — L89514 Pressure ulcer of right ankle, stage 4: Secondary | ICD-10-CM | POA: Diagnosis present

## 2017-05-27 DIAGNOSIS — A419 Sepsis, unspecified organism: Secondary | ICD-10-CM | POA: Diagnosis present

## 2017-05-27 DIAGNOSIS — G9341 Metabolic encephalopathy: Secondary | ICD-10-CM | POA: Diagnosis present

## 2017-05-27 DIAGNOSIS — Z7401 Bed confinement status: Secondary | ICD-10-CM

## 2017-05-27 DIAGNOSIS — Z452 Encounter for adjustment and management of vascular access device: Secondary | ICD-10-CM

## 2017-05-27 DIAGNOSIS — R627 Adult failure to thrive: Secondary | ICD-10-CM | POA: Diagnosis present

## 2017-05-27 DIAGNOSIS — T68XXXD Hypothermia, subsequent encounter: Secondary | ICD-10-CM | POA: Diagnosis not present

## 2017-05-27 DIAGNOSIS — Z7901 Long term (current) use of anticoagulants: Secondary | ICD-10-CM

## 2017-05-27 DIAGNOSIS — E222 Syndrome of inappropriate secretion of antidiuretic hormone: Secondary | ICD-10-CM | POA: Diagnosis present

## 2017-05-27 DIAGNOSIS — H3530 Unspecified macular degeneration: Secondary | ICD-10-CM | POA: Diagnosis present

## 2017-05-27 DIAGNOSIS — R5383 Other fatigue: Secondary | ICD-10-CM | POA: Diagnosis not present

## 2017-05-27 DIAGNOSIS — L89894 Pressure ulcer of other site, stage 4: Secondary | ICD-10-CM | POA: Diagnosis present

## 2017-05-27 DIAGNOSIS — R531 Weakness: Secondary | ICD-10-CM

## 2017-05-27 DIAGNOSIS — Z933 Colostomy status: Secondary | ICD-10-CM

## 2017-05-27 DIAGNOSIS — I480 Paroxysmal atrial fibrillation: Secondary | ICD-10-CM | POA: Diagnosis present

## 2017-05-27 DIAGNOSIS — G4733 Obstructive sleep apnea (adult) (pediatric): Secondary | ICD-10-CM | POA: Diagnosis present

## 2017-05-27 DIAGNOSIS — T68XXXA Hypothermia, initial encounter: Secondary | ICD-10-CM | POA: Diagnosis present

## 2017-05-27 DIAGNOSIS — Z9841 Cataract extraction status, right eye: Secondary | ICD-10-CM

## 2017-05-27 DIAGNOSIS — N179 Acute kidney failure, unspecified: Secondary | ICD-10-CM | POA: Diagnosis present

## 2017-05-27 DIAGNOSIS — Z515 Encounter for palliative care: Secondary | ICD-10-CM | POA: Diagnosis present

## 2017-05-27 DIAGNOSIS — I361 Nonrheumatic tricuspid (valve) insufficiency: Secondary | ICD-10-CM | POA: Diagnosis not present

## 2017-05-27 DIAGNOSIS — Z66 Do not resuscitate: Secondary | ICD-10-CM | POA: Diagnosis present

## 2017-05-27 DIAGNOSIS — E861 Hypovolemia: Secondary | ICD-10-CM | POA: Diagnosis present

## 2017-05-27 DIAGNOSIS — R0600 Dyspnea, unspecified: Secondary | ICD-10-CM

## 2017-05-27 DIAGNOSIS — Z9842 Cataract extraction status, left eye: Secondary | ICD-10-CM

## 2017-05-27 DIAGNOSIS — R54 Age-related physical debility: Secondary | ICD-10-CM | POA: Diagnosis present

## 2017-05-27 DIAGNOSIS — L98424 Non-pressure chronic ulcer of back with necrosis of bone: Secondary | ICD-10-CM | POA: Diagnosis not present

## 2017-05-27 DIAGNOSIS — I4891 Unspecified atrial fibrillation: Secondary | ICD-10-CM | POA: Diagnosis not present

## 2017-05-27 LAB — CBC WITH DIFFERENTIAL/PLATELET
BASOS ABS: 0 10*3/uL (ref 0.0–0.1)
Basophils Relative: 0 %
EOS ABS: 0 10*3/uL (ref 0.0–0.7)
EOS PCT: 0 %
HCT: 31.1 % — ABNORMAL LOW (ref 39.0–52.0)
Hemoglobin: 10.4 g/dL — ABNORMAL LOW (ref 13.0–17.0)
LYMPHS ABS: 0.9 10*3/uL (ref 0.7–4.0)
Lymphocytes Relative: 3 %
MCH: 26.9 pg (ref 26.0–34.0)
MCHC: 33.4 g/dL (ref 30.0–36.0)
MCV: 80.4 fL (ref 78.0–100.0)
Monocytes Absolute: 0.3 10*3/uL (ref 0.1–1.0)
Monocytes Relative: 1 %
NEUTROS PCT: 96 %
Neutro Abs: 29.9 10*3/uL — ABNORMAL HIGH (ref 1.7–7.7)
PLATELETS: 216 10*3/uL (ref 150–400)
RBC: 3.87 MIL/uL — AB (ref 4.22–5.81)
RDW: 17 % — AB (ref 11.5–15.5)
WBC: 31.2 10*3/uL — AB (ref 4.0–10.5)

## 2017-05-27 LAB — COMPREHENSIVE METABOLIC PANEL
ALBUMIN: 2.6 g/dL — AB (ref 3.5–5.0)
ALT: 14 U/L — AB (ref 17–63)
AST: 19 U/L (ref 15–41)
Alkaline Phosphatase: 106 U/L (ref 38–126)
Anion gap: 13 (ref 5–15)
BUN: 39 mg/dL — AB (ref 6–20)
CHLORIDE: 82 mmol/L — AB (ref 101–111)
CO2: 19 mmol/L — AB (ref 22–32)
CREATININE: 1.09 mg/dL (ref 0.61–1.24)
Calcium: 8.4 mg/dL — ABNORMAL LOW (ref 8.9–10.3)
GFR calc Af Amer: >60 mL/min (ref >60)
GFR calc non Af Amer: >60 mL/min (ref >60)
GLUCOSE: 90 mg/dL (ref 65–99)
POTASSIUM: 5.4 mmol/L — AB (ref 3.5–5.1)
SODIUM: 114 mmol/L — AB (ref 135–145)
Total Bilirubin: 1.1 mg/dL (ref 0.3–1.2)
Total Protein: 5.9 g/dL — ABNORMAL LOW (ref 6.5–8.1)

## 2017-05-27 LAB — URINALYSIS, ROUTINE W REFLEX MICROSCOPIC
BACTERIA UA: NONE SEEN
Glucose, UA: NEGATIVE mg/dL
Hgb urine dipstick: NEGATIVE
KETONES UR: NEGATIVE mg/dL
Nitrite: NEGATIVE
PH: 8 (ref 5.0–8.0)
RBC / HPF: NONE SEEN RBC/hpf (ref 0–5)
SQUAMOUS EPITHELIAL / LPF: NONE SEEN
Specific Gravity, Urine: 1.023 (ref 1.005–1.030)
WBC UA: NONE SEEN WBC/hpf (ref 0–5)

## 2017-05-27 LAB — TROPONIN I: Troponin I: <0.03 ng/mL (ref <0.03)

## 2017-05-27 LAB — SEDIMENTATION RATE: Sed Rate: 32 mm/hr — ABNORMAL HIGH (ref 0–16)

## 2017-05-27 LAB — TSH: TSH: 1.711 u[IU]/mL (ref 0.350–4.500)

## 2017-05-27 MED ORDER — ALPRAZOLAM 0.5 MG PO TABS
0.5000 mg | ORAL_TABLET | Freq: Two times a day (BID) | ORAL | Status: DC | PRN
  Administered 2017-05-28 – 2017-06-01 (×3): 0.5 mg via ORAL
  Filled 2017-05-27 (×3): qty 1

## 2017-05-27 MED ORDER — SODIUM BICARBONATE 8.4 % IV SOLN
50.0000 meq | Freq: Once | INTRAVENOUS | Status: AC
  Administered 2017-05-27: 50 meq via INTRAVENOUS
  Filled 2017-05-27: qty 50

## 2017-05-27 MED ORDER — PRO-STAT SUGAR FREE PO LIQD
30.0000 mL | Freq: Two times a day (BID) | ORAL | Status: DC
  Administered 2017-05-28 – 2017-06-05 (×14): 30 mL via ORAL
  Filled 2017-05-27 (×14): qty 30

## 2017-05-27 MED ORDER — ENOXAPARIN SODIUM 40 MG/0.4ML ~~LOC~~ SOLN
40.0000 mg | SUBCUTANEOUS | Status: DC
  Administered 2017-05-28 – 2017-06-04 (×8): 40 mg via SUBCUTANEOUS
  Filled 2017-05-27 (×8): qty 0.4

## 2017-05-27 MED ORDER — ACETAMINOPHEN 325 MG PO TABS
650.0000 mg | ORAL_TABLET | Freq: Four times a day (QID) | ORAL | Status: DC | PRN
  Administered 2017-06-04: 650 mg via ORAL
  Filled 2017-05-27: qty 2

## 2017-05-27 MED ORDER — VANCOMYCIN HCL IN DEXTROSE 1-5 GM/200ML-% IV SOLN
1000.0000 mg | Freq: Once | INTRAVENOUS | Status: AC
  Administered 2017-05-27: 1000 mg via INTRAVENOUS
  Filled 2017-05-27: qty 200

## 2017-05-27 MED ORDER — SODIUM CHLORIDE 0.9 % IV SOLN
INTRAVENOUS | Status: AC
  Administered 2017-05-27 – 2017-05-28 (×2): via INTRAVENOUS

## 2017-05-27 MED ORDER — CALCIUM GLUCONATE 10 % IV SOLN
INTRAVENOUS | Status: AC
  Filled 2017-05-27: qty 10

## 2017-05-27 MED ORDER — TETRAHYDROZOLINE HCL 0.05 % OP SOLN
2.0000 [drp] | Freq: Every day | OPHTHALMIC | Status: DC | PRN
  Filled 2017-05-27: qty 15

## 2017-05-27 MED ORDER — NYSTATIN 100000 UNIT/GM EX POWD
Freq: Every day | CUTANEOUS | Status: DC
  Administered 2017-05-28 – 2017-06-05 (×9): via TOPICAL
  Filled 2017-05-27 (×2): qty 15

## 2017-05-27 MED ORDER — SODIUM CHLORIDE 0.9 % IV BOLUS (SEPSIS)
500.0000 mL | Freq: Once | INTRAVENOUS | Status: AC
  Administered 2017-05-27: 500 mL via INTRAVENOUS

## 2017-05-27 MED ORDER — ACETAMINOPHEN 650 MG RE SUPP: 650.0000 mg | Freq: Four times a day (QID) | RECTAL | Status: DC | PRN

## 2017-05-27 MED ORDER — CALCIUM GLUCONATE 10 % IV SOLN
1.0000 g | Freq: Once | INTRAVENOUS | Status: AC
  Administered 2017-05-27: 1 g via INTRAVENOUS
  Filled 2017-05-27: qty 10

## 2017-05-27 MED ORDER — PIPERACILLIN-TAZOBACTAM 3.375 G IVPB 30 MIN
3.3750 g | Freq: Once | INTRAVENOUS | Status: AC
  Administered 2017-05-27: 3.375 g via INTRAVENOUS
  Filled 2017-05-27: qty 50

## 2017-05-27 MED ORDER — SODIUM CHLORIDE 0.9 % IV BOLUS (SEPSIS)
2000.0000 mL | Freq: Once | INTRAVENOUS | Status: AC
  Administered 2017-05-27: 2000 mL via INTRAVENOUS

## 2017-05-27 MED ORDER — SODIUM POLYSTYRENE SULFONATE 15 GM/60ML PO SUSP
15.0000 g | Freq: Once | ORAL | Status: AC
  Administered 2017-05-28: 15 g via ORAL
  Filled 2017-05-27: qty 60

## 2017-05-27 NOTE — ED Notes (Signed)
PT HAS 16 FR FOLEY CATHETER IN PLACE ON ADMIT IT IS DRAINING CLOUDY URINE WITH SEDIMENT

## 2017-05-27 NOTE — ED Notes (Signed)
PT REFUSED TO HAVE RECTAL TEMPERATURE DONE

## 2017-05-27 NOTE — ED Triage Notes (Signed)
Home health nurse wanted pt transported to ER.  Pt hypotensive ,  Weak.  Pt also has several bed sores.

## 2017-05-27 NOTE — ED Provider Notes (Signed)
[facility] EMERGENCY DEPARTMENT Provider Note   CSN: [id number] Arrival date & time: 05/27/17  1525     History   Chief Complaint Chief Complaint  Patient presents with  . Weakness    HPI Jon [name] is a 83 y.o. male.  The history is provided by medical records. The history is limited by the condition of the patient.  Weakness  Primary symptoms include no focal weakness. This is a new problem. Episode onset: unsure. Progression since onset: worsening  There was no focality noted. There has been no fever. Associated symptoms include altered mental status and confusion. Pertinent negatives include no shortness of breath. There were no medications administered prior to arrival.    Past Medical History:  Diagnosis Date  . Abnormality of gait   . Arthritis   . B12 deficiency   . Bilateral hydronephrosis   . Bladder calculi   . Bleeding ulcer   . Dysrhythmia   . Essential hypertension   . Essential tremor   . Gastritis   . Macular degeneration of left eye   . Memory loss   . Multiple rib fractures   . Neuropathy   . OSA (obstructive sleep apnea)    Does not use CPAP  . PAF (paroxysmal atrial fibrillation) (HCC)   . Presence of permanent cardiac pacemaker   . Tachycardia-bradycardia syndrome (HCC)    [employer] PPM - Dr. Royann Shivers  . Varicose veins    Bilateral    Patient Active Problem List   Diagnosis Date Noted  . Weak 05/27/2017  . Lethargy 05/27/2017  . Leukocytosis   . N&V (nausea and vomiting)   . Goals of care, counseling/discussion   . DNR (do not resuscitate) discussion   . Palliative care encounter   . Pressure injury of skin 08/19/2016  . Septic shock (HCC) 08/19/2016  . Gastric distention   . Heme positive stool   . UGI bleed 08/18/2016  . Sacral decubitus ulcer, stage IV (HCC) 08/29/2015  . Epistaxis 05/10/2016  . Diarrhea 05/02/2016  . Prolonged QT interval   . Paroxysmal atrial fibrillation (HCC)   . Tachycardia-bradycardia  syndrome (HCC)   . Cardiac pacemaker in situ   . Hematemesis 05/15/2015  . Gastrointestinal hemorrhage 05/15/2015  . Malnutrition of moderate degree (HCC) 07/11/2014  . Perianal infection 07/06/2014  . Decubitus ulcer 07/06/2014  . Sacral ulcer (HCC) 07/06/2014  . Anemia of chronic disease 06/16/2014  . Sepsis (HCC) 06/15/2014  . Lower urinary tract infectious disease 06/15/2014  . Acute kidney injury (HCC) 06/15/2014  . Bilateral hydronephrosis 06/15/2014  . Bladder calculi 06/15/2014  . Rhabdomyolysis 06/15/2014  . Hyponatremia 06/15/2014  . Multiple rib fractures 06/15/2014  . Hypotension 06/15/2014  . S/P placement of cardiac pacemaker 06/15/2014  . Dementia 06/15/2014  . AKI (acute kidney injury) (HCC) 06/15/2014  . Knee pain   . Dehydration 06/09/2014  . Hypertension 06/09/2014  . Abnormality of gait 05/03/2013  . Essential and other specified forms of tremor 05/03/2013  . Polyneuropathy in other diseases classified elsewhere (HCC) 05/03/2013  . Atrial fibrillation (HCC) 09/07/2012  . Long term current use of anticoagulant therapy 09/07/2012    Past Surgical History:  Procedure Laterality Date  . BRAIN SURGERY    . CATARACT EXTRACTION Bilateral 2013  . COLON SURGERY    . COLOSTOMY N/A 07/11/2014   Procedure: DIVERTING COLOSTOMY;  Surgeon: Dalia Heading, MD;  Location: [facility] ORS;  Service: General;  Laterality: N/A;  wound class: clean contaminated  . ESOPHAGOGASTRODUODENOSCOPY  N/A 05/16/2015   Dr. Darrick PennaFields: few linear erosions/ulcerastions in distal esophagus, non-erosive gastritis, no duodenal abnormalities. Path with chronic gastritis, negative H.pylori.   . INCISION AND DRAINAGE OF WOUND N/A 07/11/2014   Procedure: DEBRIDEMENT OF SACRUM;  Surgeon: Dalia HeadingMark A Jenkins, MD;  Location: [facility] ORS;  Service: General;  Laterality: N/A;  . INSERT / REPLACE / REMOVE PACEMAKER    . MENINGIOMA REMOVAL  1992   POST RT FRONTAL   . PACEMAKER INSERTION  2006  . PERMANENT PACEMAKER  GENERATOR CHANGE N/A 08/13/2012   Procedure: PERMANENT PACEMAKER GENERATOR CHANGE;  Surgeon: Thurmon FairMihai Croitoru, MD;  Location: [facility] CATH LAB;  Service: Cardiovascular;  Laterality: N/A;       Home Medications    Prior to Admission medications   Medication Sig Start Date End Date Taking? Authorizing Provider  acetaminophen (TYLENOL) 500 MG tablet Take 500 mg by mouth every 6 (six) hours as needed for mild pain.   Yes [provider]  ALPRAZolam Prudy Feeler(XANAX) 1 MG tablet Take 0.5 tablets (0.5 mg total) by mouth 2 (two) times daily as needed for anxiety. Patient taking differently: Take 0.5 mg by mouth 5 (five) times daily as needed for anxiety.  06/18/14   Hollice EspyKrishnan, Sendil K, MD  lisinopril (PRINIVIL,ZESTRIL) 10 MG tablet Take 10 mg by mouth daily.    [provider]  Multiple Vitamins-Minerals (ICAPS AREDS FORMULA PO) Take 1 tablet by mouth daily.    [provider]  nystatin (MYCOSTATIN/NYSTOP) powder Apply 1 application topically daily. 02/20/16   [provider]  oxyCODONE-acetaminophen (PERCOCET) 10-325 MG tablet  05/20/17   [provider]  sotalol (BETAPACE) 160 MG tablet Take 1 tablet (160 mg total) by mouth 2 (two) times daily. Need appointment for future refills 06/06/15   Croitoru, Mihai, MD  Tetrahydrozoline HCl (EYE DROPS OP) Apply 2 drops to eye daily as needed (irritation).    [provider]    Family History Family History  Problem Relation Age of Onset  . Aortic aneurysm Mother   . Parkinsonism Mother   . Heart disease Father   . Esophageal cancer Sister   . Colon cancer Neg Hx     Social History Social History   Tobacco Use  . Smoking status: Former Smoker    Packs/day: 0.75    Years: 25.00    Pack years: 18.75    Types: Cigarettes  . Smokeless tobacco: Never Used  . Tobacco comment: QUIT 35 YRS AGO  Substance Use Topics  . Alcohol use: No  . Drug use: No     Allergies   Iohexol; Shellfish allergy; Dilantin  [phenytoin sodium extended]; and Tegretol [carbamazepine]   Review of Systems Review of Systems  Respiratory: Negative for shortness of breath.   Neurological: Positive for weakness. Negative for focal weakness.  Psychiatric/Behavioral: Positive for confusion.  All other systems reviewed and are negative.    Physical Exam Updated Vital Signs BP (!) 81/69   Pulse 83   Temp (S) 97.6 F (36.4 C) (Axillary) Comment (Src): Pt refused rectal   Resp 15   Ht 6' (1.829 m)   Wt 61.2 kg (135 lb)   SpO2 100%   BMI 18.31 kg/m   Physical Exam  Constitutional: He appears well-developed. He appears cachectic.  HENT:  Head: Normocephalic and atraumatic.  Dry chapped, red lips  Eyes: Conjunctivae and EOM are normal.  Sunken  Neck: Normal range of motion.  Cardiovascular: Normal rate.  Pulmonary/Chest: Effort normal. Tachypnea noted. No respiratory distress.  He has decreased breath sounds.  Abdominal: He exhibits no distension.  Scaphoid  Musculoskeletal: Normal range of motion.  Neurological: He is alert.  Skin: Skin is warm and dry.  Nursing note and vitals reviewed.    ED Treatments / Results  Labs (all labs ordered are listed, but only abnormal results are displayed) Labs Reviewed  CBC WITH DIFFERENTIAL/PLATELET - Abnormal; Notable for the following components:      Result Value   WBC 31.2 (*)    RBC 3.87 (*)    Hemoglobin 10.4 (*)    HCT 31.1 (*)    RDW 17.0 (*)    Neutro Abs 29.9 (*)    All other components within normal limits  COMPREHENSIVE METABOLIC PANEL - Abnormal; Notable for the following components:   Sodium 114 (*)    Potassium 5.4 (*)    Chloride 82 (*)    CO2 19 (*)    BUN 39 (*)    Calcium 8.4 (*)    Total Protein 5.9 (*)    Albumin 2.6 (*)    ALT 14 (*)    All other components within normal limits  URINALYSIS, ROUTINE W REFLEX MICROSCOPIC - Abnormal; Notable for the following components:   APPearance TURBID (*)    Bilirubin Urine SMALL (*)     Protein, ur >=300 (*)    Leukocytes, UA MODERATE (*)    All other components within normal limits  CULTURE, BLOOD (ROUTINE X 2)  CULTURE, BLOOD (ROUTINE X 2)  TROPONIN I    EKG  EKG Interpretation  Date/Time:  Tuesday May 27 2017 17:27:57 EST Ventricular Rate:  91 PR Interval:    QRS Duration: 96 QT Interval:  340 QTC Calculation: 419 R Axis:   77 Text Interpretation:  Atrial fibrillation Abnormal R-wave progression, early transition Nonspecific repol abnormality, diffuse leads No significant change since last tracing Confirmed by Marily MemosMesner, Paradise Vensel [phone]([id number]) on 05/27/2017 5:47:52 PM       Radiology Dg Chest 1 View  Result Date: 05/27/2017 CLINICAL DATA:  Low blood pressure. EXAM: CHEST 1 VIEW COMPARISON:  08/19/2016 . FINDINGS: Previously identified PICC line and NG tube normal chest x-ray 08/19/2016 have been removed. Cardiac pacer with lead tip over the right atrium and right ventricle. Heart size stable. Lung volumes with mild bibasilar atelectasis. Previously identified right base infiltrate has largely cleared. No pleural effusion or pneumothorax . IMPRESSION: 1. Cardiac pacer noted with lead tip over the right atrium and right ventricle. Heart size stable. 2. Low lung volumes with mild basilar atelectasis . Previously identified infiltrate right lung base has largely cleared. Electronically Signed   By: Maisie Fushomas  Register   On: 05/27/2017 16:26   Ct Head Wo Contrast  Result Date: 05/27/2017 CLINICAL DATA:  Hypotension and weakness today. EXAM: CT HEAD WITHOUT CONTRAST TECHNIQUE: Contiguous axial images were obtained from the base of the skull through the vertex without intravenous contrast. COMPARISON:  Head CT scan 06/09/2014. FINDINGS: Brain: There is cortical atrophy and chronic microvascular ischemic change. Encephalomalacia right frontal lobe subjacent to a craniotomy defect is unchanged. No evidence of acute abnormality including hemorrhage, infarct, mass lesion, mass effect,  midline shift or abnormal extra-axial fluid collection. No hydrocephalus or pneumocephalus. Vascular: No hyperdense vessel or unexpected calcification. Skull: No fracture.  Craniotomy defect noted. Sinuses/Orbits: Extensive mucosal thickening in the maxillary sinuses bilaterally is partially imaged and chronic. Other: None. IMPRESSION: No acute abnormality. Atrophy and chronic microvascular ischemic change. Encephalomalacia right frontal lobe subjacent to a craniotomy defect. Chronic  bilateral maxillary sinus disease. Electronically Signed   By: Drusilla Kanner M.D.   On: 05/27/2017 16:35    Procedures .Central Line Date/Time: 05/27/2017 8:42 PM Performed by: Marily Memos, MD Authorized by: Marily Memos, MD   Consent:    Consent obtained:  Written   Consent given by:  Guardian and healthcare agent   Risks discussed:  Arterial puncture, incorrect placement, nerve damage, bleeding and infection   Alternatives discussed:  No treatment, delayed treatment, alternative treatment and observation Pre-procedure details:    Hand hygiene: Hand hygiene performed prior to insertion     Sterile barrier technique: All elements of maximal sterile technique followed     Skin preparation:  2% chlorhexidine   Skin preparation agent: Skin preparation agent completely dried prior to procedure   Anesthesia (see MAR for exact dosages):    Anesthesia method:  Local infiltration   Local anesthetic:  Lidocaine 1% w/o epi Procedure details:    Location:  L femoral   Site selection rationale:  Difficult to move head so right not used. left with difficult to obtain anatomy.    Patient position:  Flat   Procedural supplies:  Triple lumen   Catheter size:  7 Fr   Landmarks identified: no     Ultrasound guidance: yes     Sterile ultrasound techniques: Sterile gel and sterile probe covers were used     Number of attempts:  1   Successful placement: yes   Post-procedure details:    Post-procedure:  Dressing  applied and line sutured   Assessment:  Blood return through all ports   Patient tolerance of procedure:  Tolerated well, no immediate complications    CRITICAL CARE Performed by: Marily Memos Total critical care time: 45 minutes Critical care time was exclusive of separately billable procedures and treating other patients. Critical care was necessary to treat or prevent imminent or life-threatening deterioration. Critical care was time spent personally by me on the following activities: development of treatment plan with patient and/or surrogate as well as nursing, discussions with consultants, evaluation of patient's response to treatment, examination of patient, obtaining history from patient or surrogate, ordering and performing treatments and interventions, ordering and review of laboratory studies, ordering and review of radiographic studies, pulse oximetry and re-evaluation of patient's condition.   Medications Ordered in ED Medications  sodium chloride 0.9 % bolus 2,000 mL (0 mLs Intravenous Stopped 05/27/17 1752)  vancomycin (VANCOCIN) IVPB 1000 mg/200 mL premix (0 mg Intravenous Stopped 05/27/17 1916)  piperacillin-tazobactam (ZOSYN) IVPB 3.375 g (0 g Intravenous Stopped 05/27/17 1744)  sodium chloride 0.9 % bolus 500 mL (0 mLs Intravenous Stopped 05/27/17 1940)     Initial Impression / Assessment and Plan / ED Course  I have reviewed the triage vital signs and the nursing notes.  Pertinent labs & imaging results that were available during my care of the patient were reviewed by me and considered in my medical decision making (see chart for details).  Suspect dehydration as he has chapped lips and appears chronically ill. Will give fluids, eval cardiac, renal and for infection. abx in case of infection as cause. Multiple worsening bed sores on shoulder and around rectum, possibly superimposed infections there too.     Labs c/w hyponatremia, leukocytosis. No UTI, PNA or other  obvious infection, possibly just dehydration? Either way needs observed for hyponatremia and further hydration.   Persistent low BP, discussed with hospitalist, will place cvc, start levophed and admit to stepdown.   Final Clinical  Impressions(s) / ED Diagnoses   Final diagnoses:  Hypotension, unspecified hypotension type  Hyponatremia  Pressure injury, stage 3, unspecified location [facility])    ED Discharge Orders    None       Breelle Hollywood, Barbara Cower, MD 05/27/17 2044

## 2017-05-27 NOTE — ED Notes (Signed)
Date and time results received: 05/27/17 1645 (use smartphrase ".now" to insert current time)  Test: NA  Critical Value: 114  Name of Provider Notified: MESNER  Orders Received? Or Actions Taken?:  NO NEW ORDERS

## 2017-05-27 NOTE — H&P (Addendum)
TRH H&P   Patient Demographics:    Jon [name], is a 83 y.o. male  MRN: [id number]   DOB - 01-Jun-1932  Admit Date - 05/27/2017  Outpatient Primary MD for the patient is Lemmie Evens, MD  Referring MD/NP/PA: Merrily Pew  Outpatient Specialists:   Patient coming from: home  Chief Complaint  Patient presents with  . Weakness      HPI:    Jon [name]  is a 83 y.o. male, w Dementia, hypertension, Pafib , tachy brady s/p pacer, Osa, Essential tremor, b12 deficiency apparently has been more lethargic and weak today.  Afebrile.  Pt was brought in to ED for evaluation by family .  His son states that the patient is a FULL CODE. Prior code status DNR  In ED, pt noted to be hypotensive.    CXR   IMPRESSION: 1. Cardiac pacer noted with lead tip over the right atrium and right ventricle. Heart size stable.  2. Low lung volumes with mild basilar atelectasis . Previously identified infiltrate right lung base has largely cleared.   ua negative  Wbc 31.2, Hgb 10,4 Plt 216 Na 114, K 5.4,  Bun 39, Creatinine 1.09 Alb 2.6 Ast 19, Alt 14 Trop <0.03  Pt will be admitted for sepsis and  Hypotension.    Review of systems:    In addition to the HPI above,  + sacral ulcer No Fever-chills, No Headache, No changes with Vision or hearing, No problems swallowing food or Liquids, No Chest pain, Cough or Shortness of Breath, No Abdominal pain, No Nausea or Vommitting, Bowel movements are regular, No Blood in stool or Urine, No dysuria,  No new joints pains-aches,  No new weakness, tingling, numbness in any extremity, No recent weight gain or loss, No polyuria, polydypsia or polyphagia, No significant Mental Stressors.  A full 10 point Review of Systems was done, except as stated above, all other Review of Systems were negative.   With Past History of the  following :    Past Medical History:  Diagnosis Date  . Abnormality of gait   . Arthritis   . B12 deficiency   . Bilateral hydronephrosis   . Bladder calculi   . Bleeding ulcer   . Dysrhythmia   . Essential hypertension   . Essential tremor   . Gastritis   . Macular degeneration of left eye   . Memory loss   . Multiple rib fractures   . Neuropathy   . OSA (obstructive sleep apnea)    Does not use CPAP  . PAF (paroxysmal atrial fibrillation) ([place])   . Presence of permanent cardiac pacemaker   . Tachycardia-bradycardia syndrome (HCC)    [employer] PPM - Dr. Sallyanne Kuster  . Varicose veins    Bilateral      Past Surgical History:  Procedure Laterality Date  . BRAIN SURGERY    . CATARACT EXTRACTION  Bilateral 2013  . COLON SURGERY    . COLOSTOMY N/A 07/11/2014   Procedure: DIVERTING COLOSTOMY;  Surgeon: Jamesetta So, MD;  Location: [facility];  Service: General;  Laterality: N/A;  wound class: clean contaminated  . ESOPHAGOGASTRODUODENOSCOPY N/A 05/16/2015   Dr. Oneida Alar: few linear erosions/ulcerastions in distal esophagus, non-erosive gastritis, no duodenal abnormalities. Path with chronic gastritis, negative H.pylori.   . INCISION AND DRAINAGE OF WOUND N/A 07/11/2014   Procedure: DEBRIDEMENT OF SACRUM;  Surgeon: Jamesetta So, MD;  Location: [facility];  Service: General;  Laterality: N/A;  . INSERT / REPLACE / REMOVE PACEMAKER    . MENINGIOMA REMOVAL  1992   POST RT FRONTAL   . PACEMAKER INSERTION  2006  . PERMANENT PACEMAKER GENERATOR CHANGE N/A 08/13/2012   Procedure: PERMANENT PACEMAKER GENERATOR CHANGE;  Surgeon: Sanda Klein, MD;  Location: [facility];  Service: Cardiovascular;  Laterality: N/A;      Social History:     Social History   Tobacco Use  . Smoking status: Former Smoker    Packs/day: 0.75    Years: 25.00    Pack years: 18.75    Types: Cigarettes  . Smokeless tobacco: Never Used  . Tobacco comment: QUIT 35 YRS AGO  Substance Use Topics  . Alcohol use:  No     Lives - at home  Mobility - unable to walk    Family History :     Family History  Problem Relation Age of Onset  . Aortic aneurysm Mother   . Parkinsonism Mother   . Heart disease Father   . Esophageal cancer Sister   . Colon cancer Neg Hx       Home Medications:   Prior to Admission medications   Medication Sig Start Date End Date Taking? Authorizing Provider  ALPRAZolam Duanne Moron) 1 MG tablet Take 0.5 tablets (0.5 mg total) by mouth 2 (two) times daily as needed for anxiety. Patient taking differently: Take 0.5 mg by mouth 5 (five) times daily as needed for anxiety.  06/18/14   Annita Brod, MD  amoxicillin-clavulanate (AUGMENTIN) 875-125 MG tablet Take 1 tablet by mouth every 12 (twelve) hours. 08/25/16   Isaac Bliss, Rayford Halsted, MD  HYDROcodone-acetaminophen [facility]) 10-325 MG tablet Take 1 tablet by mouth every 6 (six) hours as needed for moderate pain.  05/04/15   [provider]  lisinopril (PRINIVIL,ZESTRIL) 10 MG tablet Take 10 mg by mouth daily.    [provider]  Multiple Vitamins-Minerals (ICAPS AREDS FORMULA PO) Take 1 tablet by mouth daily.    [provider]  nystatin (MYCOSTATIN/NYSTOP) powder Apply 1 application topically daily. 02/20/16   [provider]  oxyCODONE-acetaminophen (PERCOCET) 10-325 MG tablet  05/20/17   [provider]  sotalol (BETAPACE) 160 MG tablet Take 1 tablet (160 mg total) by mouth 2 (two) times daily. Need appointment for future refills 06/06/15   Croitoru, Mihai, MD  Tetrahydrozoline HCl (EYE DROPS OP) Apply 2 drops to eye daily as needed (irritation).    [provider]  warfarin (COUMADIN) 2.5 MG tablet  04/26/17   [provider]     Allergies:     Allergies  Allergen Reactions  . Iohexol      Desc: PT STATES THROAT/FACE SWELLING W/ IVP DYE IN 1990. PT HAS NOT HAD ANY EXAMS DONE W/DYE SINCE AND HAS NEVER BEEN PRE MEDICATED. PER PT, Onset Date: [id number]   .  Shellfish Allergy   . Dilantin [Phenytoin Sodium Extended] Rash  .  Tegretol [Carbamazepine] Rash     Physical Exam:   Vitals  Blood pressure (!) 81/69, pulse 83, temperature 97.6 F (36.4 C), temperature source (S) Axillary, resp. rate 15, height 6' (1.829 m), weight 61.2 kg (135 lb), SpO2 100 %.   1. General  lying in bed in NAD,  2. Normal affect and insight, Not Suicidal or Homicidal, Awake Alert, Oriented X 2.  3. No F.N deficits, ALL C.Nerves Intact, Strength 5/5 all 4 extremities, Sensation intact all 4 extremities, Plantars down going.  4. Ears and Eyes appear Normal, Conjunctivae clear, PERRLA. Moist Oral Mucosa.  5. Supple Neck, No JVD, No cervical lymphadenopathy appriciated, No Carotid Bruits.  6. Symmetrical Chest wall movement, Good air movement bilaterally, CTAB.  7. RRR, No Gallops, Rubs or Murmurs, No Parasternal Heave.  8. Positive Bowel Sounds, Abdomen Soft, No tenderness, No organomegaly appriciated,No rebound -guarding or rigidity.  9.  No Cyanosis,  + sacral decubitus ulcer + skin ulcer on upper back.   10. Good muscle tone,  joints appear normal , no effusions, Normal ROM.  11. No Palpable Lymph Nodes in Neck or Axillae  + pacer, + colostomy   Data Review:    CBC Recent Labs  Lab 05/27/17 1547  WBC 31.2*  HGB 10.4*  HCT 31.1*  PLT 216  MCV 80.4  MCH 26.9  MCHC 33.4  RDW 17.0*  LYMPHSABS 0.9  MONOABS 0.3  EOSABS 0.0  BASOSABS 0.0   ------------------------------------------------------------------------------------------------------------------  Chemistries  Recent Labs  Lab 05/27/17 1547  NA 114*  K 5.4*  CL 82*  CO2 19*  GLUCOSE 90  BUN 39*  CREATININE 1.09  CALCIUM 8.4*  AST 19  ALT 14*  ALKPHOS 106  BILITOT 1.1   ------------------------------------------------------------------------------------------------------------------ estimated creatinine clearance is 44.4 mL/min (by C-G formula based on SCr of 1.09  mg/dL). ------------------------------------------------------------------------------------------------------------------ No results for input(s): TSH, T4TOTAL, T3FREE, THYROIDAB in the last 72 hours.  Invalid input(s): FREET3  Coagulation profile No results for input(s): INR, PROTIME in the last 168 hours. ------------------------------------------------------------------------------------------------------------------- No results for input(s): DDIMER in the last 72 hours. -------------------------------------------------------------------------------------------------------------------  Cardiac Enzymes Recent Labs  Lab 05/27/17 1547  TROPONINI <0.03   ------------------------------------------------------------------------------------------------------------------ No results found for: BNP   ---------------------------------------------------------------------------------------------------------------  Urinalysis    Component Value Date/Time   COLORURINE YELLOW 05/27/2017 1543   APPEARANCEUR TURBID (A) 05/27/2017 1543   LABSPEC 1.023 05/27/2017 1543   PHURINE 8.0 05/27/2017 1543   GLUCOSEU NEGATIVE 05/27/2017 1543   HGBUR NEGATIVE 05/27/2017 1543   BILIRUBINUR SMALL (A) 05/27/2017 1543   KETONESUR NEGATIVE 05/27/2017 1543   PROTEINUR >=300 (A) 05/27/2017 1543   UROBILINOGEN 0.2 03/14/2015 1515   NITRITE NEGATIVE 05/27/2017 1543   LEUKOCYTESUR MODERATE (A) 05/27/2017 1543    ----------------------------------------------------------------------------------------------------------------   Imaging Results:    Dg Chest 1 View  Result Date: 05/27/2017 CLINICAL DATA:  Low blood pressure. EXAM: CHEST 1 VIEW COMPARISON:  08/19/2016 . FINDINGS: Previously identified PICC line and NG tube normal chest x-ray 08/19/2016 have been removed. Cardiac pacer with lead tip over the right atrium and right ventricle. Heart size stable. Lung volumes with mild bibasilar atelectasis.  Previously identified right base infiltrate has largely cleared. No pleural effusion or pneumothorax . IMPRESSION: 1. Cardiac pacer noted with lead tip over the right atrium and right ventricle. Heart size stable. 2. Low lung volumes with mild basilar atelectasis . Previously identified infiltrate right lung base has largely cleared. Electronically Signed   By: Marcello Moores  Register   On: 05/27/2017 16:26  Ct Head Wo Contrast  Result Date: 05/27/2017 CLINICAL DATA:  Hypotension and weakness today. EXAM: CT HEAD WITHOUT CONTRAST TECHNIQUE: Contiguous axial images were obtained from the base of the skull through the vertex without intravenous contrast. COMPARISON:  Head CT scan 06/09/2014. FINDINGS: Brain: There is cortical atrophy and chronic microvascular ischemic change. Encephalomalacia right frontal lobe subjacent to a craniotomy defect is unchanged. No evidence of acute abnormality including hemorrhage, infarct, mass lesion, mass effect, midline shift or abnormal extra-axial fluid collection. No hydrocephalus or pneumocephalus. Vascular: No hyperdense vessel or unexpected calcification. Skull: No fracture.  Craniotomy defect noted. Sinuses/Orbits: Extensive mucosal thickening in the maxillary sinuses bilaterally is partially imaged and chronic. Other: None. IMPRESSION: No acute abnormality. Atrophy and chronic microvascular ischemic change. Encephalomalacia right frontal lobe subjacent to a craniotomy defect. Chronic bilateral maxillary sinus disease. Electronically Signed   By: Inge Rise M.D.   On: 05/27/2017 16:35      Assessment & Plan:    Principal Problem:   Weak Active Problems:   Sepsis (HCC)   Hyponatremia   Hypotension   Sacral ulcer (HCC)   Lethargy   Hyponatremia Check serum osm, cortisol, tsh Check urine osm, urine sodium Hydrate gently with ns iv  Sepsis unclear source (? Sacral decub) Blood culture x2 vanco iv pharmacy to dose, zosyn iv pharmacy to dose  Hypotension  secondary to ? Sepsis Trop I q6h x3 Check cardiac echo  Leukocytosis Repeat cbc in am  Hyperkalemia Calcium gluconate Sodium bicarb Kayexalate 15gm po x1 STOP lisinopril Check cmp in am  Severe protein calorie malnutrition prostat  Sacral decubitus ulcer Check ESR Wound culture Wound care RN to evaluate Consider further imaging r/o osteomyelitis  Pafib Cont Sotalol (hold sbp <100) Unclear why not on anticoagulation , ? Hx of Bleeding ulcer vs fall risk  Hypertension STOP lisinopril due to hypotension/hyperkalemia    DVT Prophylaxis lovenox,  SCDs   AM Labs Ordered, also please review Full Orders  Family Communication: Admission, patients condition and plan of care including tests being ordered have been discussed with the patient  who indicate understanding and agree with the plan and Code Status.  Code Status FULL CODE  Likely DC to  TBD  Condition GUARDED   Consults called:  none  Admission status: inpatient  Time spent in minutes : 45 critical care   Jani Gravel M.D on 05/27/2017 at 8:39 PM  Between 7am to 7pm - Pager - [phone]  . After 7pm go to [email or web address] - password [facility]  [facility] - Office  [phone]

## 2017-05-28 ENCOUNTER — Inpatient Hospital Stay (HOSPITAL_COMMUNITY): Payer: Medicare Other

## 2017-05-28 ENCOUNTER — Encounter (HOSPITAL_COMMUNITY): Payer: Self-pay | Admitting: Primary Care

## 2017-05-28 DIAGNOSIS — R531 Weakness: Secondary | ICD-10-CM

## 2017-05-28 DIAGNOSIS — Z515 Encounter for palliative care: Secondary | ICD-10-CM

## 2017-05-28 DIAGNOSIS — Z7189 Other specified counseling: Secondary | ICD-10-CM

## 2017-05-28 DIAGNOSIS — L8993 Pressure ulcer of unspecified site, stage 3: Secondary | ICD-10-CM

## 2017-05-28 DIAGNOSIS — I361 Nonrheumatic tricuspid (valve) insufficiency: Secondary | ICD-10-CM

## 2017-05-28 DIAGNOSIS — L98424 Non-pressure chronic ulcer of back with necrosis of bone: Secondary | ICD-10-CM

## 2017-05-28 LAB — ECHOCARDIOGRAM COMPLETE
Height: 66 in
Weight: 2070.56 oz

## 2017-05-28 LAB — COMPREHENSIVE METABOLIC PANEL
ALK PHOS: 83 U/L (ref 38–126)
ALT: 13 U/L — AB (ref 17–63)
AST: 16 U/L (ref 15–41)
Albumin: 2.1 g/dL — ABNORMAL LOW (ref 3.5–5.0)
Anion gap: 10 (ref 5–15)
BUN: 35 mg/dL — ABNORMAL HIGH (ref 6–20)
CALCIUM: 8.2 mg/dL — AB (ref 8.9–10.3)
CO2: 21 mmol/L — ABNORMAL LOW (ref 22–32)
CREATININE: 0.83 mg/dL (ref 0.61–1.24)
Chloride: 90 mmol/L — ABNORMAL LOW (ref 101–111)
Glucose, Bld: 104 mg/dL — ABNORMAL HIGH (ref 65–99)
Potassium: 4.2 mmol/L (ref 3.5–5.1)
Sodium: 121 mmol/L — ABNORMAL LOW (ref 135–145)
TOTAL PROTEIN: 4.9 g/dL — AB (ref 6.5–8.1)
Total Bilirubin: 0.7 mg/dL (ref 0.3–1.2)

## 2017-05-28 LAB — CBC
HCT: 27.1 % — ABNORMAL LOW (ref 39.0–52.0)
Hemoglobin: 9.1 g/dL — ABNORMAL LOW (ref 13.0–17.0)
MCH: 27.2 pg (ref 26.0–34.0)
MCHC: 33.6 g/dL (ref 30.0–36.0)
MCV: 80.9 fL (ref 78.0–100.0)
PLATELETS: 177 10*3/uL (ref 150–400)
RBC: 3.35 MIL/uL — AB (ref 4.22–5.81)
RDW: 17.1 % — ABNORMAL HIGH (ref 11.5–15.5)
WBC: 23.2 10*3/uL — AB (ref 4.0–10.5)

## 2017-05-28 LAB — CORTISOL: CORTISOL PLASMA: 44.4 ug/dL

## 2017-05-28 LAB — OSMOLALITY: Osmolality: 249 mOsm/kg — CL (ref 275–295)

## 2017-05-28 LAB — MRSA PCR SCREENING: MRSA BY PCR: POSITIVE — AB

## 2017-05-28 LAB — TROPONIN I: Troponin I: <0.03 ng/mL (ref <0.03)

## 2017-05-28 MED ORDER — ADULT MULTIVITAMIN LIQUID CH
15.0000 mL | Freq: Every day | ORAL | Status: DC
  Administered 2017-05-29 – 2017-05-31 (×3): 15 mL via ORAL
  Filled 2017-05-28 (×6): qty 15

## 2017-05-28 MED ORDER — ENSURE ENLIVE PO LIQD
237.0000 mL | Freq: Two times a day (BID) | ORAL | Status: DC
  Administered 2017-05-29 – 2017-05-30 (×2): 237 mL via ORAL

## 2017-05-28 MED ORDER — HYDROCERIN EX CREA
TOPICAL_CREAM | Freq: Two times a day (BID) | CUTANEOUS | Status: AC
  Administered 2017-05-28 (×2): via TOPICAL
  Filled 2017-05-28: qty 113

## 2017-05-28 MED ORDER — PIPERACILLIN-TAZOBACTAM 3.375 G IVPB
3.3750 g | Freq: Three times a day (TID) | INTRAVENOUS | Status: DC
  Administered 2017-05-28 – 2017-05-31 (×9): 3.375 g via INTRAVENOUS
  Filled 2017-05-28 (×7): qty 50

## 2017-05-28 MED ORDER — SODIUM CHLORIDE 0.9 % IV BOLUS (SEPSIS)
1000.0000 mL | Freq: Once | INTRAVENOUS | Status: AC
  Administered 2017-05-28: 1000 mL via INTRAVENOUS

## 2017-05-28 MED ORDER — PERFLUTREN LIPID MICROSPHERE
1.0000 mL | INTRAVENOUS | Status: AC | PRN
  Administered 2017-05-28: 2 mL via INTRAVENOUS
  Filled 2017-05-28: qty 10

## 2017-05-28 MED ORDER — JUVEN PO PACK
1.0000 | PACK | Freq: Two times a day (BID) | ORAL | Status: DC
Start: 2017-05-28 — End: 2017-06-05
  Administered 2017-05-29 – 2017-06-05 (×13): 1 via ORAL
  Filled 2017-05-28 (×19): qty 1

## 2017-05-28 MED ORDER — PIPERACILLIN-TAZOBACTAM 3.375 G IVPB
3.3750 g | Freq: Once | INTRAVENOUS | Status: AC
  Administered 2017-05-28: 3.375 g via INTRAVENOUS
  Filled 2017-05-28: qty 50

## 2017-05-28 MED ORDER — VANCOMYCIN HCL 500 MG IV SOLR
500.0000 mg | Freq: Two times a day (BID) | INTRAVENOUS | Status: DC
  Administered 2017-05-28 – 2017-05-30 (×6): 500 mg via INTRAVENOUS
  Filled 2017-05-28 (×11): qty 500

## 2017-05-28 NOTE — Progress Notes (Signed)
Initial Nutrition Assessment  DOCUMENTATION CODES:  Severe malnutrition in context of chronic illness (vs in context of social or environmental circumstances)  INTERVENTION:  Continue Ensure Enlive po BID, each supplement provides 350 kcal and 20 grams of protein  Continue Will order 30 mL Prostat BID, each supplement provides 100 kcal and 15 grams of protein.  If patient will accept, Juven modular to provide arginine, glutamine, Hmb to help promote tissue granulation  MVI with minerals to provide adequate substrate for wound healing. May benefit from further Zinc/Vit C supplementation given large amounts of drainage.   NUTRITION DIAGNOSIS:  Severe Malnutrition related to poor appetite, social / environmental circumstances (question adequate care at home), chronic illness (dementia may be contributing) as evidenced by severe muscle and severe fat depletion  GOAL:  Patient will meet greater than or equal to 90% of their needs  MONITOR:  PO intake, Supplement acceptance, Diet advancement, Labs, Weight trends, Skin, I & O's, GOC  REASON FOR ASSESSMENT:  Malnutrition Screening Tool    ASSESSMENT:  83 y/o PMHx Dementia, HTN, Afib, Tachy/brady s/p pacer, OSA, tremor, B12 deficiency. Brought to ED by family due to increased lethargy/weakness. Worked up for hypotension, hyperkalemia,  severe hyponatremia (114) and likely sepsis. Has multiple bed sores.   Patient is seen during bath. Patient responds to questions, though somewhat difficult to understand. Answers seemed to be appropriate. He says his UBW is ~130. He says he drinks Ensure. Rn notes patient has not had anything to eat or drink since admit. UOP was poor. Noted colostomy, bag is empty and has not had any output per RN. Abdomen mildly firm. Patient denies having any constipation or abd pain.   RD asked the patient if there was anything he would eat. He said bacon/eggs.   Given patient mentioned Ensure by name, will try this. He  would certainly benefit from MVI with minerals and prostat. Patient would be good candidate for Juven, however, unsure if patient is consuming enough oral intake for him to drink this.   Physical Exam: Moderate muscle wasting of temporalis, deltoids, clavicular musculature. Severe muscle wasting of gastrocnemius, quadriceps. Severe fat wasting of thorax and underarm reserves. Mild edema in hands. Noted several wounds/dry skin. Noted WOC recommended surgical consult if POC is aggressive care  There is minimal recent information in chart. He appears to have been 135 lbs in May and has not lost a significant amount of weight since then. PLease note the patient was weighing ~190 lbs 3 years ago.  Has lost 1/3 of his body weight since.   Will implements above listed supplements and monitor intake, GOC, wounds.   Labs: Na up to 121, K corrected, Renal labs improved, Albumin 2.1. WBC: 23.2 h/h:9.1/27.1 Meds: Ensure Enlive, Prostat, IVF, IV Abx  Recent Labs  Lab 05/27/17 1547 05/28/17 0523  NA 114* 121*  K 5.4* 4.2  CL 82* 90*  CO2 19* 21*  BUN 39* 35*  CREATININE 1.09 0.83  CALCIUM 8.4* 8.2*  GLUCOSE 90 104*   NUTRITION - FOCUSED PHYSICAL EXAM:   Most Recent Value  Orbital Region  No depletion  Upper Arm Region  Severe depletion  Thoracic and Lumbar Region  Severe depletion  Buccal Region  Mild depletion  Temple Region  Moderate depletion  Clavicle Bone Region  Moderate depletion  Clavicle and Acromion Bone Region  Moderate depletion  Scapular Bone Region  Unable to assess  Dorsal Hand  Unable to assess  Patellar Region  Severe depletion  Anterior  Thigh Region  Severe depletion  Posterior Calf Region  Severe depletion  Edema (RD Assessment)  Mild  Hair  Reviewed  Eyes  Reviewed  Mouth  Reviewed  Skin  Reviewed  Nails  Reviewed       Diet Order:  Diet Heart Room service appropriate? Yes; Fluid consistency: Thin  EDUCATION NEEDS:  No education needs have been identified at  this time  Skin:  Please see WOC note for specifics.  R hip: DTI w/o drainage Right IT PU stage 4 w/ mod drainage Sacral PU stage 4 with large amount drainage R scapulae: Skin tear, Partial thickness w/o drainage R thoracic Chest: Stage 4 w/ large amount drainage, directly, palpable rib, 40% necrotic tissue R Lateral ankle: 2x stage II PUs w/o drainage  Last BM:  Unknown  Height:  Ht Readings from Last 1 Encounters:  05/27/17 5\' 6"  (1.676 m)   Weight:  Wt Readings from Last 1 Encounters:  05/28/17 129 lb 6.6 oz (58.7 kg)   Wt Readings from Last 10 Encounters:  05/28/17 129 lb 6.6 oz (58.7 kg)  08/24/16 135 lb 2.3 oz (61.3 kg)  05/11/16 136 lb 11 oz (62 kg)  05/05/16 150 lb 5.7 oz (68.2 kg)  05/15/15 140 lb 10.5 oz (63.8 kg)  05/09/15 193 lb (87.5 kg)  07/08/14 193 lb 12.8 oz (87.9 kg)  06/16/14 189 lb 9.5 oz (86 kg)  06/09/14 179 lb 16 oz (81.6 kg)  08/26/13 186 lb 6.4 oz (84.6 kg)   Ideal Body Weight:  64.55 kg  BMI:  Body mass index is 20.89 kg/m.  Estimated Nutritional Needs:  Kcal:  2000-2200 (34-38 kcal/kg bw) Protein:  100-115 g pro (1.7-2g/kg bw) Fluid:  2-2.2 L fluid  Christophe LouisNathan Kacia Halley RD, LDN, CNSC Clinical Nutrition Pager: [phone] 05/28/2017 3:02 PM

## 2017-05-28 NOTE — Progress Notes (Signed)
*  PRELIMINARY RESULTS* Echocardiogram 2D Echocardiogram with definity has been performed.  Jon [name], Jon [name] 05/28/2017, 11:26 AM

## 2017-05-28 NOTE — Consult Note (Signed)
WOC Nurse wound consult note Reason for Consult: Numerous pressure injuries, sacral, right IT and right thoracic chest are stage 4.  Intertriginous dermatitis, IAD and overall poor hygiene with large amount of dry peeling skin on LEs and feet.Patent has been living at home with a family member. It is not known if patient has [facility], but it does not seem likely with wounds of this nature and stage. Nutritional consult is needed. Also recommend goals of care conference. Wound type:Pressure, moisture Pressure Injury POA: Yes Measurement: Right hip:  4cm x 2cm DTPI with no depth. Purple/marroon discoloration of skin. No exudate Right IT:  3.5cm x 7cm x 2cm with undermining to 5.5cm at 11 o'clock.  Stage 4.  Bone is directly palpable.  30% necrotic tissue, 70% red. Moderate amount of serosanguinous exudate. Sacral:  9cm x 11cm x 3cm with 3cm undermining from 7-3 o'clock. Stage 4.  60% red, 40% necrotic. Large amount of serous to serosanguinous exudate. Right scapulae:  4cm x 1cm x 0.2cm skin tear (partial thickness).  Red, moist wound bed. No exudate. Spine:  2cm round x 0.2cm Stage 2 Pressure Injury.  Red wound bed, scant serous exudate. Right Thoracic Chest:  7.5cm x 8.5cm x 2.5cm Stage 4 wound with rib directly palpable. 40 % necrotic, 60 % red, moist.  Large amount of serous to serosanguinous exudate. Right lateral ankle and bony prominence on foot:  Two Stage 2 pressure injuries, 1.5cm round x 0.2cm.  Pink, moist wound bed after large amount of skin and dried serum removed. Cleanse with NS, pat gently dry.  Cover with silicone foam dressing, place in [employer] Boot. Wound bed:As noted above Drainage (amount, consistency, odor) As noted above Periwound: very dry, peeling. Dressing procedure/placement/frequency:Paitent is on a therapeutic mattress with low air loss feature. This will be ordered when he leaves ICU and goes to floor as well. Bilateral Prevalon boots for pressure  redistribution have already been provided. Conservative care orders for saline dampened gauze used as a filler topped with dry gauze, ABD and secured with paper tape is ordered.  If you desire, surgical consult is recommended for more aggressive POC. Guidance for Nursing in terms of hygiene and moisturizing is provided via the Orders.   WOC Nurse ostomy consult note Stoma type/location: LLQ colostomy Stomal assessment/size: approximately 1 and 1/4 inches round, red, budded with os at center. Peristomal assessment: not seen today Treatment options for stomal/peristomal skin: skin barrier ring with next pouch change Output: firm brown stool pieces in pouch Ostomy pouching: 2pc. (2 and 3/4 inch pouching system is in place.  Stoma size is appropriate for 2 and 1/4 inch size system with next pouch change).  Supply [place] #s provided for staff via the orders. Education provided: None.  Patient is in ICU. Enrolled patient in [employer] Secure Start DC program: No  WOC nursing team will not follow, but will remain available to this patient, the nursing and medical teams.  Please re-consult if needed. Thanks, Jon MowLaurie Debora Stockdale, MSN, RN, GNP, Hans EdenCWOCN, CWON-[facility], FAAN  Pager# [phone]

## 2017-05-28 NOTE — Progress Notes (Signed)
Pt old foley catheter looked very nasty. Removed old foley catheter and tan, mucousy, pustulous, malodorous urine began pouring out of penis. Performed peri care and replaced 68F foley catheter. Flushed with 60cc normal saline and drained out pustulous urine. Would suggest having frequent cathether flushes to help clear out sediment and infection.

## 2017-05-28 NOTE — Progress Notes (Signed)
ANTIBIOTIC CONSULT NOTE follow up  Pharmacy Consult for Vancomycin and Zosyn Indication: Sepsis  Allergies  Allergen Reactions  . Iohexol      Desc: PT STATES THROAT/FACE SWELLING W/ IVP DYE IN 1990. PT HAS NOT HAD ANY EXAMS DONE W/DYE SINCE AND HAS NEVER BEEN PRE MEDICATED. PER PT, Onset Date: [id number]   . Shellfish Allergy   . Dilantin [Phenytoin Sodium Extended] Rash  . Tegretol [Carbamazepine] Rash   Patient Measurements: Height: 5\' 6"  (167.6 cm) Weight: 129 lb 6.6 oz (58.7 kg) IBW/kg (Calculated) : 63.8  Vital Signs: Temp: 97.3 F (36.3 C) (12/12 0757) Temp Source: Axillary (12/12 0757) BP: 79/61 (12/12 0645) Pulse Rate: 91 (12/12 0645)  Labs: Recent Labs    05/27/17 1547 05/28/17 0523  WBC 31.2* 23.2*  HGB 10.4* 9.1*  PLT 216 177  CREATININE 1.09 0.83    Estimated Creatinine Clearance: 56 mL/min (by C-G formula based on SCr of 0.83 mg/dL).  No results for input(s): VANCOTROUGH, VANCOPEAK, VANCORANDOM, GENTTROUGH, GENTPEAK, GENTRANDOM, TOBRATROUGH, TOBRAPEAK, TOBRARND, AMIKACINPEAK, AMIKACINTROU, AMIKACIN in the last 72 hours.   Microbiology: Recent Results (from the past 720 hour(s))  Blood culture (routine x 2)     Status: None (Preliminary result)   Collection Time: 05/27/17  8:02 PM  Result Value Ref Range Status   Specimen Description BLOOD RIGHT ARM  Final   Special Requests   Final    BOTTLES DRAWN AEROBIC AND ANAEROBIC Blood Culture results may not be optimal due to an inadequate volume of blood received in culture bottles   Culture NO GROWTH < 24 HOURS  Final   Report Status PENDING  Incomplete  Blood culture (routine x 2)     Status: None (Preliminary result)   Collection Time: 05/27/17  8:06 PM  Result Value Ref Range Status   Specimen Description BLOOD RIGHT HAND  Final   Special Requests   Final    BOTTLES DRAWN AEROBIC AND ANAEROBIC Blood Culture results may not be optimal due to an inadequate volume of blood received in culture bottles   Culture NO GROWTH < 24 HOURS  Final   Report Status PENDING  Incomplete  MRSA PCR Screening     Status: Abnormal   Collection Time: 05/27/17 10:51 PM  Result Value Ref Range Status   MRSA by PCR POSITIVE (A) NEGATIVE Final    Comment:        The GeneXpert MRSA Assay (FDA approved for NASAL specimens only), is one component of a comprehensive MRSA colonization surveillance program. It is not intended to diagnose MRSA infection nor to guide or monitor treatment for MRSA infections. RESULT CALLED TO, READ BACK BY AND VERIFIED WITH: ALLEN N. AT 0900 ON [id number] BY THOMPSON S.    Medical History: Past Medical History:  Diagnosis Date  . Abnormality of gait   . Arthritis   . B12 deficiency   . Bilateral hydronephrosis   . Bladder calculi   . Bleeding ulcer   . Dysrhythmia   . Essential hypertension   . Essential tremor   . Gastritis   . Macular degeneration of left eye   . Memory loss   . Multiple rib fractures   . Neuropathy   . OSA (obstructive sleep apnea)    Does not use CPAP  . PAF (paroxysmal atrial fibrillation) (HCC)   . Presence of permanent cardiac pacemaker   . Tachycardia-bradycardia syndrome (HCC)    [employer] PPM - Dr. Royann Shiversroitoru  . Varicose veins    Bilateral  Medications:  Vancomycin 1000 mg x 1 dose ordered by the EDP Zosyn 3.375 Gm IV x 1 dose ordered by the EDP  Assessment: 83 yo male seen in the ED for weakness and hypotension; WBCs are elevated to 31,000 >> 23.2 K.   Pt is admitted for possible sepsis. Pharmacy has been consulted for Vancomycin and Zosyn dosing.  Goal of Therapy:  Vancomycin troughs 15-20 mcg/ml Eradicate infection  Plan:  Vancomycin 500mg  IV q12hrs Check trough at steady state Zosyn 3.375gm IV q8h, EID Monitor labs, progress, c/s  Valrie HartHall, Shakirah Kirkey A, RPH 05/28/2017,10:05 AM

## 2017-05-28 NOTE — Clinical Social Work Note (Signed)
LCSW left a message for [facility] requesting return contact in regards to making a report per request of patient's attending. Report is being made due to the suspicion of inadequate care due to patient's extensive wounds.    Jayleen Scaglione, Juleen ChinaHeather D, LCSW

## 2017-05-28 NOTE — Clinical Social Work Note (Signed)
LCSW spoke with Toy BakerMelanie Hankins at [facility] and made a APS referral regarding patient.     Harlei Lehrmann, Juleen ChinaHeather D, LCSW

## 2017-05-28 NOTE — Progress Notes (Signed)
ANTIBIOTIC CONSULT NOTE-Preliminary  Pharmacy Consult for Vancomycin and Zosyn Indication: Sepsis  Allergies  Allergen Reactions  . Iohexol      Desc: PT STATES THROAT/FACE SWELLING W/ IVP DYE IN 1990. PT HAS NOT HAD ANY EXAMS DONE W/DYE SINCE AND HAS NEVER BEEN PRE MEDICATED. PER PT, Onset Date: [id number]   . Shellfish Allergy   . Dilantin [Phenytoin Sodium Extended] Rash  . Tegretol [Carbamazepine] Rash    Patient Measurements: Height: 5\' 6"  (167.6 cm) Weight: 129 lb 6.6 oz (58.7 kg) IBW/kg (Calculated) : 63.8  Vital Signs: Temp: 98 F (36.7 C) (12/11 2339) Temp Source: Axillary (12/11 2339) BP: 90/65 (12/11 2200) Pulse Rate: 95 (12/11 2200)  Labs: Recent Labs    05/27/17 1547  WBC 31.2*  HGB 10.4*  PLT 216  CREATININE 1.09    Estimated Creatinine Clearance: 42.6 mL/min (by C-G formula based on SCr of 1.09 mg/dL).  No results for input(s): VANCOTROUGH, VANCOPEAK, VANCORANDOM, GENTTROUGH, GENTPEAK, GENTRANDOM, TOBRATROUGH, TOBRAPEAK, TOBRARND, AMIKACINPEAK, AMIKACINTROU, AMIKACIN in the last 72 hours.   Microbiology: Recent Results (from the past 720 hour(s))  Blood culture (routine x 2)     Status: None (Preliminary result)   Collection Time: 05/27/17  8:02 PM  Result Value Ref Range Status   Specimen Description BLOOD RIGHT ARM  Final   Special Requests   Final    BOTTLES DRAWN AEROBIC AND ANAEROBIC Blood Culture results may not be optimal due to an inadequate volume of blood received in culture bottles   Culture PENDING  Incomplete   Report Status PENDING  Incomplete  Blood culture (routine x 2)     Status: None (Preliminary result)   Collection Time: 05/27/17  8:06 PM  Result Value Ref Range Status   Specimen Description BLOOD RIGHT HAND  Final   Special Requests   Final    BOTTLES DRAWN AEROBIC AND ANAEROBIC Blood Culture results may not be optimal due to an inadequate volume of blood received in culture bottles   Culture PENDING  Incomplete   Report  Status PENDING  Incomplete    Medical History: Past Medical History:  Diagnosis Date  . Abnormality of gait   . Arthritis   . B12 deficiency   . Bilateral hydronephrosis   . Bladder calculi   . Bleeding ulcer   . Dysrhythmia   . Essential hypertension   . Essential tremor   . Gastritis   . Macular degeneration of left eye   . Memory loss   . Multiple rib fractures   . Neuropathy   . OSA (obstructive sleep apnea)    Does not use CPAP  . PAF (paroxysmal atrial fibrillation) (HCC)   . Presence of permanent cardiac pacemaker   . Tachycardia-bradycardia syndrome (HCC)    [employer] PPM - Dr. Royann Shiversroitoru  . Varicose veins    Bilateral    Medications:  Vancomycin 1000 mg x 1 dose ordered by the EDP Zosyn 3.375 Gm IV x 1 dose ordered by the EDP  Assessment: 83 yo male seen in the ED for weakness and hypotension; WBCs are elevated to 31,000. Pt is admitted for possible sepsis. Pharmacy has been consulted for Vancomycin and Zosyn dosing.  Goal of Therapy:  Vancomycin troughs 15-20 mcg/ml Eradicate infection  Plan:  Preliminary review of pertinent patient information completed.  Protocol will be initiated with 1 dose of Zosyn 3.375 Gm IV to be given 6-8 hours after the initial ED dose.Pattricia Boss.  [place] clinical pharmacist will complete review during morning  rounds to assess patient and finalize treatment regimen if needed.  Arelia SneddonMason, Dynasti Kerman Anne, [facility] 05/28/2017,12:19 AM

## 2017-05-28 NOTE — Progress Notes (Signed)
PROGRESS NOTE    Jon [name]  ZOX:[id number] DOB: 1931/08/17 DOA: 05/27/2017 PCP: Gareth Morgan, MD     Brief Narrative:  Patient is an 83 year old man admitted from home on 12/11 due to weakness.  In the ED patient was noticed to be hypotensive, UA was negative, sodium was 114, patient was admitted with sepsis and hypotension.   Assessment & Plan:   Principal Problem:   Weak Active Problems:   Hypotension   Sepsis (HCC)   Hyponatremia   Sacral ulcer (HCC)   Lethargy   Hypotension -I think this is mainly due to dehydration/failure to thrive and less likely sepsis. -Blood pressure this morning was around 70-80 systolic. -I have given him a 1 L bolus, will continue to bolus him with fluids before giving pressors. -Consult palliative care as patient's prognosis remains quite poor. -For now we will consider broad-spectrum antibiotic therapy with vancomycin and Zosyn.  Acute metabolic encephalopathy -Due to hypotension, dehydration as well as possible sepsis. -Moans to voice, does not make eye contact or any other meaningful interaction.  Stage IV sacral decubitus ulcers -Multiple including sacrum and mid to lower back. -Have requested wound care consultation.  Severe protein caloric malnutrition -Nutrition consultation has been requested.  Hyponatremia -This is hypovolemic in nature. -Improving with IV fluids, continue.  Paroxysmal atrial fibrillation -Rate controlled at present.  History of hypertension -Now hypotensive all meds on hold.    DVT prophylaxis: Lovenox Code Status: Full code for now Family Communication: Discussed with son Tawanna Cooler at bedside Disposition Plan: Request palliative care consultation for goals of care discussion.  Strongly recommend hospice care in this patient and DNR given his very poor prognosis.  Son seems tangential, jumps from subject to subject and is unable to focus on conversation today.  We will continue to discuss these  issues with him on a daily basis.  This patient remains critically ill.  Consultants:   None  Procedures:   None  Antimicrobials:  Anti-infectives (From admission, onward)   Start     Dose/Rate Route Frequency Ordered Stop   05/28/17 1100  piperacillin-tazobactam (ZOSYN) IVPB 3.375 g     3.375 g 12.5 mL/hr over 240 Minutes Intravenous Every 8 hours 05/28/17 0743     05/28/17 1000  vancomycin (VANCOCIN) 500 mg in sodium chloride 0.9 % 100 mL IVPB     500 mg 100 mL/hr over 60 Minutes Intravenous Every 12 hours 05/28/17 0743     05/28/17 0030  piperacillin-tazobactam (ZOSYN) IVPB 3.375 g     3.375 g 12.5 mL/hr over 240 Minutes Intravenous  Once 05/28/17 0019 05/28/17 0642   05/27/17 1700  vancomycin (VANCOCIN) IVPB 1000 mg/200 mL premix     1,000 mg 200 mL/hr over 60 Minutes Intravenous  Once 05/27/17 1652 05/27/17 1916   05/27/17 1700  piperacillin-tazobactam (ZOSYN) IVPB 3.375 g     3.375 g 100 mL/hr over 30 Minutes Intravenous  Once 05/27/17 1652 05/27/17 1744       Subjective: Lying in bed, groans, no eye contact or meaningful interaction.  Objective: Vitals:   05/28/17 1400 05/28/17 1530 05/28/17 1545 05/28/17 1726  BP: (!) 66/51 101/83 (!) 111/92   Pulse: 82 82 71   Resp: 15 (!) 9 11   Temp:    (!) 95 F (35 C)  TempSrc:    Rectal  SpO2: 100% 100% 100%   Weight:      Height:        Intake/Output Summary (Last 24 hours) at  05/28/2017 1802 Last data filed at 05/28/2017 1729 Gross per 24 hour  Intake 3681.25 ml  Output 275 ml  Net 3406.25 ml   Filed Weights   05/27/17 1532 05/27/17 2339 05/28/17 0500  Weight: 61.2 kg (135 lb) 58.7 kg (129 lb 6.6 oz) 58.7 kg (129 lb 6.6 oz)    Examination:  General exam: Lethargic, cachectic with temporal wasting Respiratory system: Clear to auscultation. Respiratory effort normal. Cardiovascular system:RRR. No murmurs, rubs, gallops. Gastrointestinal system: Abdomen is nondistended, soft and nontender. No organomegaly  or masses felt. Normal bowel sounds heard. Central nervous system: Unable to assess given current mental state Extremities: No C/C/E, +pedal pulses Skin: Multiple stage IV decubitus ulcers of the sacrum, mid and lower back Psychiatry: unable to assess given lethargy   Data Reviewed: I have personally reviewed following labs and imaging studies  CBC: Recent Labs  Lab 05/27/17 1547 05/28/17 0523  WBC 31.2* 23.2*  NEUTROABS 29.9*  --   HGB 10.4* 9.1*  HCT 31.1* 27.1*  MCV 80.4 80.9  PLT 216 177   Basic Metabolic Panel: Recent Labs  Lab 05/27/17 1547 05/28/17 0523  NA 114* 121*  K 5.4* 4.2  CL 82* 90*  CO2 19* 21*  GLUCOSE 90 104*  BUN 39* 35*  CREATININE 1.09 0.83  CALCIUM 8.4* 8.2*   GFR: Estimated Creatinine Clearance: 56 mL/min (by C-G formula based on SCr of 0.83 mg/dL). Liver Function Tests: Recent Labs  Lab 05/27/17 1547 05/28/17 0523  AST 19 16  ALT 14* 13*  ALKPHOS 106 83  BILITOT 1.1 0.7  PROT 5.9* 4.9*  ALBUMIN 2.6* 2.1*   No results for input(s): LIPASE, AMYLASE in the last 168 hours. No results for input(s): AMMONIA in the last 168 hours. Coagulation Profile: No results for input(s): INR, PROTIME in the last 168 hours. Cardiac Enzymes: Recent Labs  Lab 05/27/17 1547 05/27/17 2349 05/28/17 0523 05/28/17 1120  TROPONINI <0.03 <0.03 <0.03 <0.03   BNP (last 3 results) No results for input(s): PROBNP in the last 8760 hours. HbA1C: No results for input(s): HGBA1C in the last 72 hours. CBG: No results for input(s): GLUCAP in the last 168 hours. Lipid Profile: No results for input(s): CHOL, HDL, LDLCALC, TRIG, CHOLHDL, LDLDIRECT in the last 72 hours. Thyroid Function Tests: Recent Labs    05/27/17 2006  TSH 1.711   Anemia Panel: No results for input(s): VITAMINB12, FOLATE, FERRITIN, TIBC, IRON, RETICCTPCT in the last 72 hours. Urine analysis:    Component Value Date/Time   COLORURINE YELLOW 05/27/2017 1543   APPEARANCEUR TURBID (A)  05/27/2017 1543   LABSPEC 1.023 05/27/2017 1543   PHURINE 8.0 05/27/2017 1543   GLUCOSEU NEGATIVE 05/27/2017 1543   HGBUR NEGATIVE 05/27/2017 1543   BILIRUBINUR SMALL (A) 05/27/2017 1543   KETONESUR NEGATIVE 05/27/2017 1543   PROTEINUR >=300 (A) 05/27/2017 1543   UROBILINOGEN 0.2 03/14/2015 1515   NITRITE NEGATIVE 05/27/2017 1543   LEUKOCYTESUR MODERATE (A) 05/27/2017 1543   Sepsis Labs: @LABRCNTIP (procalcitonin:4,lacticidven:4)  ) Recent Results (from the past 240 hour(s))  Blood culture (routine x 2)     Status: None (Preliminary result)   Collection Time: 05/27/17  8:02 PM  Result Value Ref Range Status   Specimen Description BLOOD RIGHT ARM  Final   Special Requests   Final    BOTTLES DRAWN AEROBIC AND ANAEROBIC Blood Culture results may not be optimal due to an inadequate volume of blood received in culture bottles   Culture NO GROWTH < 24 HOURS  Final  Report Status PENDING  Incomplete  Blood culture (routine x 2)     Status: None (Preliminary result)   Collection Time: 05/27/17  8:06 PM  Result Value Ref Range Status   Specimen Description BLOOD RIGHT HAND  Final   Special Requests   Final    BOTTLES DRAWN AEROBIC AND ANAEROBIC Blood Culture results may not be optimal due to an inadequate volume of blood received in culture bottles   Culture NO GROWTH < 24 HOURS  Final   Report Status PENDING  Incomplete  MRSA PCR Screening     Status: Abnormal   Collection Time: 05/27/17 10:51 PM  Result Value Ref Range Status   MRSA by PCR POSITIVE (A) NEGATIVE Final    Comment:        The GeneXpert MRSA Assay (FDA approved for NASAL specimens only), is one component of a comprehensive MRSA colonization surveillance program. It is not intended to diagnose MRSA infection nor to guide or monitor treatment for MRSA infections. RESULT CALLED TO, READ BACK BY AND VERIFIED WITH: ALLEN N. AT 0900 ON [id number] BY THOMPSON S.          Radiology Studies: Dg Chest 1 View  Result  Date: 05/27/2017 CLINICAL DATA:  Low blood pressure. EXAM: CHEST 1 VIEW COMPARISON:  08/19/2016 . FINDINGS: Previously identified PICC line and NG tube normal chest x-ray 08/19/2016 have been removed. Cardiac pacer with lead tip over the right atrium and right ventricle. Heart size stable. Lung volumes with mild bibasilar atelectasis. Previously identified right base infiltrate has largely cleared. No pleural effusion or pneumothorax . IMPRESSION: 1. Cardiac pacer noted with lead tip over the right atrium and right ventricle. Heart size stable. 2. Low lung volumes with mild basilar atelectasis . Previously identified infiltrate right lung base has largely cleared. Electronically Signed   By: Maisie Fushomas  Register   On: 05/27/2017 16:26   Ct Head Wo Contrast  Result Date: 05/27/2017 CLINICAL DATA:  Hypotension and weakness today. EXAM: CT HEAD WITHOUT CONTRAST TECHNIQUE: Contiguous axial images were obtained from the base of the skull through the vertex without intravenous contrast. COMPARISON:  Head CT scan 06/09/2014. FINDINGS: Brain: There is cortical atrophy and chronic microvascular ischemic change. Encephalomalacia right frontal lobe subjacent to a craniotomy defect is unchanged. No evidence of acute abnormality including hemorrhage, infarct, mass lesion, mass effect, midline shift or abnormal extra-axial fluid collection. No hydrocephalus or pneumocephalus. Vascular: No hyperdense vessel or unexpected calcification. Skull: No fracture.  Craniotomy defect noted. Sinuses/Orbits: Extensive mucosal thickening in the maxillary sinuses bilaterally is partially imaged and chronic. Other: None. IMPRESSION: No acute abnormality. Atrophy and chronic microvascular ischemic change. Encephalomalacia right frontal lobe subjacent to a craniotomy defect. Chronic bilateral maxillary sinus disease. Electronically Signed   By: Drusilla Kannerhomas  Dalessio M.D.   On: 05/27/2017 16:35        Scheduled Meds: . enoxaparin (LOVENOX)  injection  40 mg Subcutaneous Q24H  . feeding supplement (ENSURE ENLIVE)  237 mL Oral BID BM  . feeding supplement (PRO-STAT SUGAR FREE 64)  30 mL Oral BID  . hydrocerin   Topical BID  . multivitamin  15 mL Oral Daily  . nutrition supplement (JUVEN)  1 packet Oral BID BM  . nystatin   Topical Daily   Continuous Infusions: . sodium chloride 75 mL/hr at 05/28/17 1200  . piperacillin-tazobactam (ZOSYN)  IV Stopped (05/28/17 1608)  . vancomycin Stopped (05/28/17 1609)     LOS: 1 day     critical care time  spent: 55 minutes. Greater than 50% of this time was spent in direct contact with the patient coordinating care.     Chaya Jan, MD [facility] Pager [phone]  If 7PM-7AM, please contact night-coverage [email or web address] Password [facility] 05/28/2017, 6:02 PM

## 2017-05-28 NOTE — Consult Note (Signed)
Consultation Note Date: 05/28/2017   Patient Name: Jon CutterWilliam P [name]  DOB: 05-Aug-1931  MRN: [id number]  Age / Sex: 83 y.o., male  PCP: Gareth MorganKnowlton, Steve, MD Referring Physician: Philip AspenHernandez Acosta, Minerva EndsEstela*  Reason for Consultation: Establishing goals of care, Hospice Evaluation and Psychosocial/spiritual support  HPI/Patient Profile: 83 y.o. male  with past medical history of dementia, bedbound status approximately 1 year, essential tremor, dysrhythmia, falls with multiple rib fractures, obstructive sleep apnea, does not use CPAP, history of paroxysmal atrial fibrillation with permanent cardiac pacemaker, tachybradycardia syndrome admitted on 05/27/2017 with sepsis, hyponatremia with sodium 114, multiple bedsores.   Clinical Assessment and Goals of Care: Jon [name] is lying quietly in bed.  When I touch his chest, he will briefly open his eyes, but not make eye contact.  He is unable to communicate even his most basic needs.  There is no family at bedside at this time.  Call to son, Jon [name].  We talked at length about his father's health history, his father's current illness.  Jon [name] seems to have circumstantiality, and tangential thoughts.  He states that his father's wounds were healed, but "broke out again".  Jon [name] states several times that his father was in [facility] skilled nursing facility but was made to leave because they "wanted more money, were going to put his father on the street". We talked about a face-to-face meeting sometime tomorrow, but Jon [name] is unwilling/unable to give a time.  He again has tangential/circumstantial thoughts.  He tells me he should be here at the hospital all day, he plans to spend the night tonight.  I expressed my concern over the severity of [name]'s illness, sharing that I am not sure that his father will be able to survive this hospitalization.  Jon [name] states that his  father has made it through multiple hospitalizations.  I share that things look different now, and we are very concerned.  Conference with advanced home health service provider.  She shares that there are nursing staff has "seen a dramatic decline in the last 2 weeks.  They share that Jon [name]'s needs are beyond what advanced home health care can provide.  They recommend hospice care.  Healthcare power of attorney NEXT OF KIN - son Jon [name] is Social research officer, governmentsurrogate decision-maker. No other children or family. From March/2018 note: Jon [name] shares that his brother died in May, his mother had tube feeding for a swallowing problem for 12 to 15 years, and she also died within the last 2 years (July 3). Jon [name] goes on to share that his mother's sister, who lived next door to them his whole life, also has died within the last couple of years.  SUMMARY OF RECOMMENDATIONS   At this point, full scope treatment. Continue CODE STATUS discussions. Continue end-of-life discussions.  Code Status/Advance Care Planning:  Full code -I again discussed the concept of treat the treatable but no CPR, no intubation.  Son Jon [name] states that they had elected DNR previously, but his father "changed his mind".  At this point, Jon [name] is  unwilling/unable to make a different choice for Jon [name].  Symptom Management:   Per hospitalist, no additional needs at this time.  Palliative Prophylaxis:   Frequent Pain Assessment, Palliative Wound Care and Turn Reposition  Additional Recommendations (Limitations, Scope, Preferences):  Full Scope Treatment  Psycho-social/Spiritual:   Desire for further Chaplaincy support:no  Additional Recommendations: Caregiving  Support/Resources and Education on Hospice  Prognosis:   < 3 months, or less would not be surprising based on functional status (bedbound for 1 year), frailty, functional status, current infection, dementia, sodium of 114 on admit, white blood cells of 31 on admit.    Discharge Planning: To be determined, based on outcomes.      Primary Diagnoses: Present on Admission: . Sepsis (HCC) . Lethargy . Hypotension . Sacral ulcer (HCC) . Hyponatremia   I have reviewed the medical record, interviewed the patient and family, and examined the patient. The following aspects are pertinent.  Past Medical History:  Diagnosis Date  . Abnormality of gait   . Arthritis   . B12 deficiency   . Bilateral hydronephrosis   . Bladder calculi   . Bleeding ulcer   . Dysrhythmia   . Essential hypertension   . Essential tremor   . Gastritis   . Macular degeneration of left eye   . Memory loss   . Multiple rib fractures   . Neuropathy   . OSA (obstructive sleep apnea)    Does not use CPAP  . PAF (paroxysmal atrial fibrillation) (HCC)   . Presence of permanent cardiac pacemaker   . Tachycardia-bradycardia syndrome (HCC)    [employer] PPM - Dr. Royann Shivers  . Varicose veins    Bilateral   Social History   Socioeconomic History  . Marital status: Married    Spouse name: None  . Number of children: 2  . Years of education: COLLEGE  . Highest education level: None  Social Needs  . Financial resource strain: None  . Food insecurity - worry: None  . Food insecurity - inability: None  . Transportation needs - medical: None  . Transportation needs - non-medical: None  Occupational History  . Occupation: RETIRED  Tobacco Use  . Smoking status: Former Smoker    Packs/day: 0.75    Years: 25.00    Pack years: 18.75    Types: Cigarettes  . Smokeless tobacco: Never Used  . Tobacco comment: QUIT 35 YRS AGO  Substance and Sexual Activity  . Alcohol use: No  . Drug use: No  . Sexual activity: No  Other Topics Concern  . None  Social History Narrative  . None   Family History  Problem Relation Age of Onset  . Aortic aneurysm Mother   . Parkinsonism Mother   . Heart disease Father   . Esophageal cancer Sister   . Colon cancer Neg Hx    Scheduled  Meds: . enoxaparin (LOVENOX) injection  40 mg Subcutaneous Q24H  . feeding supplement (ENSURE ENLIVE)  237 mL Oral BID BM  . feeding supplement (PRO-STAT SUGAR FREE 64)  30 mL Oral BID  . hydrocerin   Topical BID  . multivitamin  15 mL Oral Daily  . nutrition supplement (JUVEN)  1 packet Oral BID BM  . nystatin   Topical Daily   Continuous Infusions: . sodium chloride 75 mL/hr at 05/28/17 1200  . piperacillin-tazobactam (ZOSYN)  IV 3.375 g (05/28/17 1200)  . vancomycin 500 mg (05/28/17 1200)   PRN Meds:.acetaminophen **OR** acetaminophen, ALPRAZolam, tetrahydrozoline Medications Prior to Admission:  Prior to Admission medications   Medication Sig Start Date End Date Taking? Authorizing Provider  acetaminophen (TYLENOL) 500 MG tablet Take 500 mg by mouth every 6 (six) hours as needed for mild pain.   Yes [provider]  ALPRAZolam Prudy Feeler(XANAX) 1 MG tablet Take 0.5 tablets (0.5 mg total) by mouth 2 (two) times daily as needed for anxiety. Patient taking differently: Take 0.5 mg by mouth 5 (five) times daily as needed for anxiety.  06/18/14  Yes Hollice EspyKrishnan, Sendil K, MD  sotalol (BETAPACE) 160 MG tablet Take 1 tablet (160 mg total) by mouth 2 (two) times daily. Need appointment for future refills Patient taking differently: Take 160 mg by mouth daily as needed. Need appointment for future refills 06/06/15  Yes Croitoru, Mihai, MD  Tetrahydrozoline HCl (EYE DROPS OP) Apply 2 drops to eye daily as needed (irritation).   Yes [provider]   Allergies  Allergen Reactions  . Iohexol      Desc: PT STATES THROAT/FACE SWELLING W/ IVP DYE IN 1990. PT HAS NOT HAD ANY EXAMS DONE W/DYE SINCE AND HAS NEVER BEEN PRE MEDICATED. PER PT, Onset Date: [id number]   . Shellfish Allergy   . Dilantin [Phenytoin Sodium Extended] Rash  . Tegretol [Carbamazepine] Rash   Review of Systems  Unable to perform ROS: Dementia    Physical Exam  Constitutional:  Appears frail, acutely/chronically ill    HENT:  Head: Atraumatic.  Temporal wasting  Cardiovascular: Normal rate.  Pulmonary/Chest: Effort normal. No respiratory distress.  Abdominal: Soft. He exhibits no distension.  Musculoskeletal:  Bilateral lower extremities very thin, scaly skin, some redness.  Seems to be nonambulatory for extended time  Neurological:  Will briefly open eyes, unable to make a sentence or communicate his needs.  Known dementia  Skin: Skin is warm and dry.  Wounds as noted by wound care nurse  Psychiatric:  Known dementia  Nursing note and vitals reviewed.   Vital Signs: BP (!) 79/61   Pulse 94   Temp (!) 97.4 F (36.3 C) (Axillary)   Resp 15   Ht 5\' 6"  (1.676 m)   Wt 58.7 kg (129 lb 6.6 oz)   SpO2 100%   BMI 20.89 kg/m  Pain Assessment: CPOT   Pain Score: 10-Worst pain ever   SpO2: SpO2: 100 % O2 Device:SpO2: 100 % O2 Flow Rate: .   IO: Intake/output summary:   Intake/Output Summary (Last 24 hours) at 05/28/2017 1531 Last data filed at 05/28/2017 1200 Gross per 24 hour  Intake 4828.75 ml  Output 275 ml  Net 4553.75 ml    LBM: Last BM Date: 05/26/17 Baseline Weight: Weight: 61.2 kg (135 lb) Most recent weight: Weight: 58.7 kg (129 lb 6.6 oz)     Palliative Assessment/Data:   Flowsheet Rows     Most Recent Value  Intake Tab  Referral Department  Hospitalist  Unit at Time of Referral  ICU  Palliative Care Primary Diagnosis  Sepsis/Infectious Disease  Date Notified  05/28/17  Palliative Care Type  Return patient Palliative Care  Reason for referral  Clarify Goals of Care  Date of Admission  05/27/17  Date first seen by Palliative Care  05/28/17  # of days Palliative referral response time  0 Day(s)  # of days IP prior to Palliative referral  1  Clinical Assessment  Palliative Performance Scale Score  20%  Pain Max last 24 hours  Not able to report  Pain Min Last 24 hours  Not able to report  Dyspnea Max Last 24 Hours  Not able to report  Dyspnea Min Last 24 hours   Not able to report  Psychosocial & Spiritual Assessment  Palliative Care Outcomes      Time In: 1450 Time Out: 1600 Time Total: 70 minutes Greater than 50%  of this time was spent counseling and coordinating care related to the above assessment and plan.  Signed by: Katheran Awe, NP   Please contact Palliative Medicine Team phone at [phone] for questions and concerns.  For individual provider: See Loretha Stapler

## 2017-05-29 DIAGNOSIS — R5383 Other fatigue: Secondary | ICD-10-CM

## 2017-05-29 DIAGNOSIS — Z7189 Other specified counseling: Secondary | ICD-10-CM

## 2017-05-29 LAB — COMPREHENSIVE METABOLIC PANEL
ALBUMIN: 1.9 g/dL — AB (ref 3.5–5.0)
ALK PHOS: 74 U/L (ref 38–126)
ALT: 12 U/L — AB (ref 17–63)
ANION GAP: 7 (ref 5–15)
AST: 20 U/L (ref 15–41)
BILIRUBIN TOTAL: 0.6 mg/dL (ref 0.3–1.2)
BUN: 29 mg/dL — ABNORMAL HIGH (ref 6–20)
CALCIUM: 7.8 mg/dL — AB (ref 8.9–10.3)
CO2: 20 mmol/L — ABNORMAL LOW (ref 22–32)
Chloride: 97 mmol/L — ABNORMAL LOW (ref 101–111)
Creatinine, Ser: 0.53 mg/dL — ABNORMAL LOW (ref 0.61–1.24)
GFR calc Af Amer: >60 mL/min (ref >60)
GFR calc non Af Amer: >60 mL/min (ref >60)
GLUCOSE: 117 mg/dL — AB (ref 65–99)
Potassium: 3 mmol/L — ABNORMAL LOW (ref 3.5–5.1)
Sodium: 124 mmol/L — ABNORMAL LOW (ref 135–145)
Total Protein: 4.3 g/dL — ABNORMAL LOW (ref 6.5–8.1)

## 2017-05-29 LAB — CBC
HEMATOCRIT: 26.1 % — AB (ref 39.0–52.0)
HEMOGLOBIN: 8.4 g/dL — AB (ref 13.0–17.0)
MCH: 26.7 pg (ref 26.0–34.0)
MCHC: 32.2 g/dL (ref 30.0–36.0)
MCV: 82.9 fL (ref 78.0–100.0)
Platelets: 129 10*3/uL — ABNORMAL LOW (ref 150–400)
RBC: 3.15 MIL/uL — AB (ref 4.22–5.81)
RDW: 17.2 % — ABNORMAL HIGH (ref 11.5–15.5)
WBC: 13.1 10*3/uL — ABNORMAL HIGH (ref 4.0–10.5)

## 2017-05-29 LAB — MAGNESIUM: Magnesium: 1.5 mg/dL — ABNORMAL LOW (ref 1.7–2.4)

## 2017-05-29 MED ORDER — POTASSIUM CHLORIDE 10 MEQ/100ML IV SOLN
10.0000 meq | INTRAVENOUS | Status: AC
  Administered 2017-05-29 (×5): 10 meq via INTRAVENOUS
  Filled 2017-05-29 (×4): qty 100

## 2017-05-29 NOTE — Progress Notes (Signed)
Patient's rectal temp 93.1. Dr. Ardyth HarpsHernandez and Cyndi Benderasha Dove,NP made aware. MD and palliative care is suppose to speak with patients son in regards to plan of care.

## 2017-05-29 NOTE — Clinical Social Work Note (Signed)
LCSW faxed today's progress note to Kym Groomarrie Friese at [facility] per attending's request.    Annice NeedySettle, Chaney Ingram D, LCSW

## 2017-05-29 NOTE — Progress Notes (Signed)
Daily Progress Note   Patient Name: Jon CutterWilliam P [name]       Date: 05/29/2017 DOB: 03-02-1932  Age: 83 y.o. MRN#: [id number] Attending Physician: Philip AspenHernandez Acosta, Minerva EndsEstela* Primary Care Physician: Gareth MorganKnowlton, Steve, MD Admit Date: 05/27/2017  Reason for Consultation/Follow-up: Establishing goals of care, Hospice Evaluation and Psychosocial/spiritual support  Subjective: Mr. Jon BrothersLambert there is lying quietly in bed.  He will open his eyes, and briefly make and keep eye contact.  He is difficult to understand, but can tell me his name, and that we are in the hospital.  Present today at bedside is son Tawanna Coolerodd.  [name] and I talked about the severity of Jon [name] illness.  He continues to have circumstantial/tangential thoughts, unable to stay on subject.  Talk continues to state that his father's desires to return home.  I share the medical team is concerned related to [name]'s ability to care for Jon [name] wounds.  Tawanna Coolerodd continues to state that his father's wounds are not that bad, and that he can care for his father.    I ask Tawanna Coolerodd if he has anyone to help him with decision making, to be a support for him.  He states that he does not.  I ask about his pastor, and church members.  Tawanna Coolerodd shares that he does have a deacon in his [employer], named [employer].  Tim's phone number is [phone].  I encouraged Tawanna Coolerodd to reach out to Tim and ask him to be present for some of our meetings.  Tawanna Coolerodd states that he does not think [name] would want to be a Management consultantdecision maker.  I reassure Tawanna Coolerodd that we are not asking for Tim to make decisions, but to be here to support both Mr. Jon ChrisLamberth and Tawanna Coolerodd.   Is on leave the ICU I encounter DSS social worker Kym GroomCarrie Friese.  We talked at length about Jon [name] and [name]'s current  living situation and social situation.  I share social history with Lyla SonCarrie.  Tawanna Coolerodd exits the ICU, states he was looking for me to ask about "a flu outbreak" at the local nursing home.  He states that he wanted to go over and visit with some friends.  One of these is a patient who is well-known to this hospital and staff who has had quadriplegia for many decades.  Tawanna Coolerodd states  that this person has been stating that he will be able to get up and walk.  This is an example of [name]'s somewhat "magical thinking".   Conversation with Lyla Sonarrie after her meeting with the EmersonLambert family.  Lyla SonCarrie states that she also has concerns for Tawanna Coolerodd as that he has no source of income other than his father's check.  She states that she will work for both Mr. Jon BrothersLambert and Tawanna Coolerodd.  She requests that the medical team provide a statement that the patient cannot make informed decisions, and does not understand the consequences of his decisions.  This is evidenced by Mr. Jon BrothersLambert statement about hospice being where a person would go to die, and he is not dying.  He clearly does not understand the severity of his illness, wounds, infection, bedbound status.  Adult Protective Services involved.  Contact person Kym GroomCarrie Friese, DSS SW [phone] fax [phone].    Length of Stay: 2  Current Medications: Scheduled Meds:  . enoxaparin (LOVENOX) injection  40 mg Subcutaneous Q24H  . feeding supplement (ENSURE ENLIVE)  237 mL Oral BID BM  . feeding supplement (PRO-STAT SUGAR FREE 64)  30 mL Oral BID  . multivitamin  15 mL Oral Daily  . nutrition supplement (JUVEN)  1 packet Oral BID BM  . nystatin   Topical Daily    Continuous Infusions: . piperacillin-tazobactam (ZOSYN)  IV 3.375 g (05/29/17 1000)  . vancomycin Stopped (05/29/17 1054)    PRN Meds: acetaminophen **OR** acetaminophen, ALPRAZolam, tetrahydrozoline  Physical Exam  Constitutional: No distress.  Appears weak and frail, will make eye contact today.   Acutely/chronically ill  HENT:  Head: Atraumatic.  Temporal wasting  Cardiovascular: Normal rate.  Pulmonary/Chest: Effort normal. No respiratory distress.  Abdominal: Soft. He exhibits no distension.  Musculoskeletal: He exhibits no edema.  Frail, cachectic, muscle wasting severe  Neurological: He is alert.  Able to tell me his name, and that we are in the hospital  Skin: Skin is warm and dry.  Multiple wounds as described by wound care nurse  Psychiatric:  Known dementia  Nursing note and vitals reviewed.           Vital Signs: BP (!) 81/64   Pulse 87   Temp (!) 93.1 F (33.9 C) (Rectal) Comment: nurse aware  Resp (!) 9   Ht 5\' 6"  (1.676 m)   Wt 61 kg (134 lb 7.7 oz)   SpO2 100%   BMI 21.71 kg/m  SpO2: SpO2: 100 % O2 Device: O2 Device: Not Delivered O2 Flow Rate:    Intake/output summary:   Intake/Output Summary (Last 24 hours) at 05/29/2017 1312 Last data filed at 05/29/2017 0500 Gross per 24 hour  Intake 2200 ml  Output 350 ml  Net 1850 ml   LBM: Last BM Date: 05/29/17 Baseline Weight: Weight: 61.2 kg (135 lb) Most recent weight: Weight: 61 kg (134 lb 7.7 oz)       Palliative Assessment/Data:    Flowsheet Rows     Most Recent Value  Intake Tab  Referral Department  Hospitalist  Unit at Time of Referral  ICU  Palliative Care Primary Diagnosis  Sepsis/Infectious Disease  Date Notified  05/28/17  Palliative Care Type  Return patient Palliative Care  Reason for referral  Clarify Goals of Care  Date of Admission  05/27/17  Date first seen by Palliative Care  05/28/17  # of days Palliative referral response time  0 Day(s)  # of days IP  prior to Palliative referral  1  Clinical Assessment  Palliative Performance Scale Score  20%  Pain Max last 24 hours  Not able to report  Pain Min Last 24 hours  Not able to report  Dyspnea Max Last 24 Hours  Not able to report  Dyspnea Min Last 24 hours  Not able to report  Psychosocial & Spiritual Assessment    Palliative Care Outcomes      Patient Active Problem List   Diagnosis Date Noted  . Weak 05/27/2017  . Lethargy 05/27/2017  . Leukocytosis   . N&V (nausea and vomiting)   . Goals of care, counseling/discussion   . DNR (do not resuscitate) discussion   . Palliative care encounter   . Pressure injury of skin 08/19/2016  . Septic shock (HCC) 08/19/2016  . Gastric distention   . Heme positive stool   . UGI bleed 08/18/2016  . Sacral decubitus ulcer, stage IV (HCC) April 19, 202017  . Epistaxis 05/10/2016  . Diarrhea 05/02/2016  . Prolonged QT interval   . Paroxysmal atrial fibrillation (HCC)   . Tachycardia-bradycardia syndrome (HCC)   . Cardiac pacemaker in situ   . Hematemesis 05/15/2015  . Gastrointestinal hemorrhage 05/15/2015  . Malnutrition of moderate degree (HCC) 07/11/2014  . Perianal infection 07/06/2014  . Decubitus ulcer 07/06/2014  . Sacral ulcer (HCC) 07/06/2014  . Anemia of chronic disease 06/16/2014  . Sepsis (HCC) 06/15/2014  . Lower urinary tract infectious disease 06/15/2014  . Acute kidney injury (HCC) 06/15/2014  . Bilateral hydronephrosis 06/15/2014  . Bladder calculi 06/15/2014  . Rhabdomyolysis 06/15/2014  . Hyponatremia 06/15/2014  . Multiple rib fractures 06/15/2014  . Hypotension 06/15/2014  . S/P placement of cardiac pacemaker 06/15/2014  . Dementia 06/15/2014  . AKI (acute kidney injury) (HCC) 06/15/2014  . Knee pain   . Dehydration 06/09/2014  . Hypertension 06/09/2014  . Abnormality of gait 05/03/2013  . Essential and other specified forms of tremor 05/03/2013  . Polyneuropathy in other diseases classified elsewhere (HCC) 05/03/2013  . Atrial fibrillation (HCC) 09/07/2012  . Long term current use of anticoagulant therapy 09/07/2012    Palliative Care Assessment & Plan   Patient Profile: 83 y.o. male  with past medical history of dementia, bedbound status approximately 1 year, essential tremor, dysrhythmia, falls with multiple rib  fractures, obstructive sleep apnea, does not use CPAP, history of paroxysmal atrial fibrillation with permanent cardiac pacemaker, tachybradycardia syndrome admitted on 05/27/2017 with sepsis, hyponatremia with sodium 114, multiple bedsores.   Assessment:  sepsis, possible source multiple bedsores: White blood cells reduced from 31-21 today, core body temperature reduced to 93.  Mr. Jon Brothers seems to be actively dying.  His wounds are, in my opinion, insurmountable. hyponatremia with sodium 114: Improving with hydration.  Likely to return without the benefits of life sustaining IV fluids.   Recommendations/Plan:  At this point, son Tawanna Cooler is seeking full scope treatment.  Adult Protective Services involved.  Contact person Kym Groom, DSS SW [phone] fax [phone]  Goals of Care and Additional Recommendations:  Limitations on Scope of Treatment: Full Scope Treatment  Code Status:    Code Status Orders  (From admission, onward)        Start     Ordered   05/27/17 2337  Full code  Continuous     05/27/17 2336    Code Status History    Date Active Date Inactive Code Status Order ID Comments User Context   08/22/2016 11:21 08/25/2016 17:32  DNR [id number]  Katheran Awe, NP Inpatient   08/18/2016 20:56 08/22/2016 11:21 Full Code [id number]  Meredeth Ide, MD Inpatient   05/10/2016 15:56 05/13/2016 18:28 Full Code [id number]  Henderson Cloud, MD ED   05/02/2016 12:48 05/06/2016 17:29 DNR [id number]  Leroy Sea, MD Inpatient   05/15/2015 16:03 05/18/2015 18:01 Full Code [id number]  Standley Brooking, MD ED   07/11/2014 11:59 07/12/2014 19:32 Full Code [id number]  Dalia Heading, MD Inpatient   07/06/2014 21:30 07/11/2014 11:59 Full Code [id number]  Doree Albee, MD ED   06/15/2014 10:41 06/18/2014 18:56 Full Code [id number]  Standley Brooking, MD Inpatient   06/09/2014 16:50 06/11/2014 21:19 Full Code [id number]  Wilson Singer, MD ED       Prognosis:   < 4  weeks or less would not be surprising based on functional status (bedbound for 1 year), frailty, functional status, current infection, dementia, sodium of 114 on admit, white blood cells of 31 on admit.   Discharge Planning:  To be determined, based on outcomes.  Medical team recommending residential hospice.  Care plan was discussed with nursing staff, case management, social worker, and Dr. Ardyth Harps.  Thank you for allowing the Palliative Medicine Team to assist in the care of this patient.   Time In: 1045 1410 Time Out: 1115 1455 Total Time 30+45=75 minutes Prolonged Time Billed  yes       Greater than 50%  of this time was spent counseling and coordinating care related to the above assessment and plan.  Katheran Awe, NP  Please contact Palliative Medicine Team phone at [phone] for questions and concerns.

## 2017-05-29 NOTE — Progress Notes (Signed)
PROGRESS NOTE    Jon [name]  WUJ:[id number] DOB: 09/12/1931 DOA: 05/27/2017 PCP: Gareth MorganKnowlton, Steve, MD     Brief Narrative:  Patient is an 83 year old man admitted from home on 12/11 due to weakness.  In the ED patient was noticed to be hypotensive, UA was negative, sodium was 114, patient was admitted with sepsis and hypotension.   Assessment & Plan:   Principal Problem:   Weak Active Problems:   Hypotension   Sepsis (HCC)   Hyponatremia   Sacral ulcer (HCC)   Lethargy   Hypotension -I think this is mainly due to dehydration/failure to thrive and less likely sepsis. -Blood pressure has now stabilized and is now in the low 100s systolic.   was never pressor dependent. -Consult palliative care as patient's prognosis remains quite poor. -For now we will continue broad-spectrum antibiotic therapy with vancomycin and Zosyn.  Acute metabolic encephalopathy -Due to hypotension, dehydration as well as possible sepsis. -Improved today, able to make eye contact and answer simple questions.  Stage IV sacral decubitus ulcers -Multiple including sacrum and mid to lower back. -Have requested wound care consultation.  Severe protein caloric malnutrition -Nutrition consultation has been requested.  Hyponatremia -This is hypovolemic in nature. -Improving with IV fluids, continue.  Hypokalemia -Replace orally.  Check magnesium level.  Paroxysmal atrial fibrillation -Rate controlled at present.  History of hypertension -Now hypotensive; all meds on hold.    DVT prophylaxis: Lovenox Code Status: Full code for now Family Communication: Discussed with son Tawanna Coolerodd at bedside Disposition Plan: Patient cannot make informed decisions.  He does not understand his prognosis and consequences of his medical illnesses.  His prognosis is very poor, anticipate weeks at most.  Strongly recommend hospice care in this patient and DNR.  Son seems tangential, jumps from subject to subject and  is unable to focus on conversation.  Continues to perseverate on the fact that he wants to take his father home despite enough evidence to suggest that he is currently overwhelmed with his father's care.  We will continue to discuss these issues with him on a daily basis.  This patient remains critically ill.  Consultants:   Palliative care  Procedures:   None  Antimicrobials:  Anti-infectives (From admission, onward)   Start     Dose/Rate Route Frequency Ordered Stop   05/28/17 1100  piperacillin-tazobactam (ZOSYN) IVPB 3.375 g     3.375 g 12.5 mL/hr over 240 Minutes Intravenous Every 8 hours 05/28/17 0743     05/28/17 1000  vancomycin (VANCOCIN) 500 mg in sodium chloride 0.9 % 100 mL IVPB     500 mg 100 mL/hr over 60 Minutes Intravenous Every 12 hours 05/28/17 0743     05/28/17 0030  piperacillin-tazobactam (ZOSYN) IVPB 3.375 g     3.375 g 12.5 mL/hr over 240 Minutes Intravenous  Once 05/28/17 0019 05/28/17 0642   05/27/17 1700  vancomycin (VANCOCIN) IVPB 1000 mg/200 mL premix     1,000 mg 200 mL/hr over 60 Minutes Intravenous  Once 05/27/17 1652 05/27/17 1916   05/27/17 1700  piperacillin-tazobactam (ZOSYN) IVPB 3.375 g     3.375 g 100 mL/hr over 30 Minutes Intravenous  Once 05/27/17 1652 05/27/17 1744       Subjective: Lying in bed, opens eyes to voice, states he is very very weak.  Objective: Vitals:   05/29/17 1121 05/29/17 1200 05/29/17 1300 05/29/17 1400  BP:  (!) 81/64 100/75 113/80  Pulse: 88 87 (!) 103 (!) 52  Resp: 14 (!)  9 16 14   Temp: (!) 93.1 F (33.9 C)     TempSrc: Rectal     SpO2: 100% 100% 100% 100%  Weight:      Height:        Intake/Output Summary (Last 24 hours) at 05/29/2017 1541 Last data filed at 05/29/2017 0500 Gross per 24 hour  Intake 1397.5 ml  Output 350 ml  Net 1047.5 ml   Filed Weights   05/27/17 2339 05/28/17 0500 05/29/17 0500  Weight: 58.7 kg (129 lb 6.6 oz) 58.7 kg (129 lb 6.6 oz) 61 kg (134 lb 7.7 oz)     Examination:  General exam: Awake, drowsy Respiratory system: Clear to auscultation. Respiratory effort normal. Cardiovascular system:RRR. No murmurs, rubs, gallops. Gastrointestinal system: Abdomen is nondistended, soft and nontender. No organomegaly or masses felt. Normal bowel sounds heard. Central nervous system: Unable to be fully assessed given drowsiness Extremities: No C/C/E, +pedal pulses Skin: Multiple stage IV decubitus ulcers of sacrum and mid to lower back and shoulder. Psychiatry: Unable to fully assess given current mental state    Data Reviewed: I have personally reviewed following labs and imaging studies  CBC: Recent Labs  Lab 05/27/17 1547 05/28/17 0523 05/29/17 0456  WBC 31.2* 23.2* 13.1*  NEUTROABS 29.9*  --   --   HGB 10.4* 9.1* 8.4*  HCT 31.1* 27.1* 26.1*  MCV 80.4 80.9 82.9  PLT 216 177 129*   Basic Metabolic Panel: Recent Labs  Lab 05/27/17 1547 05/28/17 0523 05/29/17 0456  NA 114* 121* 124*  K 5.4* 4.2 3.0*  CL 82* 90* 97*  CO2 19* 21* 20*  GLUCOSE 90 104* 117*  BUN 39* 35* 29*  CREATININE 1.09 0.83 0.53*  CALCIUM 8.4* 8.2* 7.8*   GFR: Estimated Creatinine Clearance: 60.4 mL/min (A) (by C-G formula based on SCr of 0.53 mg/dL (L)). Liver Function Tests: Recent Labs  Lab 05/27/17 1547 05/28/17 0523 05/29/17 0456  AST 19 16 20   ALT 14* 13* 12*  ALKPHOS 106 83 74  BILITOT 1.1 0.7 0.6  PROT 5.9* 4.9* 4.3*  ALBUMIN 2.6* 2.1* 1.9*   No results for input(s): LIPASE, AMYLASE in the last 168 hours. No results for input(s): AMMONIA in the last 168 hours. Coagulation Profile: No results for input(s): INR, PROTIME in the last 168 hours. Cardiac Enzymes: Recent Labs  Lab 05/27/17 1547 05/27/17 2349 05/28/17 0523 05/28/17 1120  TROPONINI <0.03 <0.03 <0.03 <0.03   BNP (last 3 results) No results for input(s): PROBNP in the last 8760 hours. HbA1C: No results for input(s): HGBA1C in the last 72 hours. CBG: No results for  input(s): GLUCAP in the last 168 hours. Lipid Profile: No results for input(s): CHOL, HDL, LDLCALC, TRIG, CHOLHDL, LDLDIRECT in the last 72 hours. Thyroid Function Tests: Recent Labs    05/27/17 2006  TSH 1.711   Anemia Panel: No results for input(s): VITAMINB12, FOLATE, FERRITIN, TIBC, IRON, RETICCTPCT in the last 72 hours. Urine analysis:    Component Value Date/Time   COLORURINE YELLOW 05/27/2017 1543   APPEARANCEUR TURBID (A) 05/27/2017 1543   LABSPEC 1.023 05/27/2017 1543   PHURINE 8.0 05/27/2017 1543   GLUCOSEU NEGATIVE 05/27/2017 1543   HGBUR NEGATIVE 05/27/2017 1543   BILIRUBINUR SMALL (A) 05/27/2017 1543   KETONESUR NEGATIVE 05/27/2017 1543   PROTEINUR >=300 (A) 05/27/2017 1543   UROBILINOGEN 0.2 03/14/2015 1515   NITRITE NEGATIVE 05/27/2017 1543   LEUKOCYTESUR MODERATE (A) 05/27/2017 1543   Sepsis Labs: @LABRCNTIP (procalcitonin:4,lacticidven:4)  ) Recent Results (from the past 240  hour(s))  Blood culture (routine x 2)     Status: None (Preliminary result)   Collection Time: 05/27/17  8:02 PM  Result Value Ref Range Status   Specimen Description BLOOD RIGHT ARM  Final   Special Requests   Final    BOTTLES DRAWN AEROBIC AND ANAEROBIC Blood Culture results may not be optimal due to an inadequate volume of blood received in culture bottles   Culture NO GROWTH 2 DAYS  Final   Report Status PENDING  Incomplete  Blood culture (routine x 2)     Status: None (Preliminary result)   Collection Time: 05/27/17  8:06 PM  Result Value Ref Range Status   Specimen Description BLOOD RIGHT HAND  Final   Special Requests   Final    BOTTLES DRAWN AEROBIC AND ANAEROBIC Blood Culture results may not be optimal due to an inadequate volume of blood received in culture bottles   Culture NO GROWTH 2 DAYS  Final   Report Status PENDING  Incomplete  MRSA PCR Screening     Status: Abnormal   Collection Time: 05/27/17 10:51 PM  Result Value Ref Range Status   MRSA by PCR POSITIVE (A)  NEGATIVE Final    Comment:        The GeneXpert MRSA Assay (FDA approved for NASAL specimens only), is one component of a comprehensive MRSA colonization surveillance program. It is not intended to diagnose MRSA infection nor to guide or monitor treatment for MRSA infections. RESULT CALLED TO, READ BACK BY AND VERIFIED WITH: ALLEN N. AT 0900 ON [id number] BY THOMPSON S.          Radiology Studies: Dg Chest 1 View  Result Date: 05/27/2017 CLINICAL DATA:  Low blood pressure. EXAM: CHEST 1 VIEW COMPARISON:  08/19/2016 . FINDINGS: Previously identified PICC line and NG tube normal chest x-ray 08/19/2016 have been removed. Cardiac pacer with lead tip over the right atrium and right ventricle. Heart size stable. Lung volumes with mild bibasilar atelectasis. Previously identified right base infiltrate has largely cleared. No pleural effusion or pneumothorax . IMPRESSION: 1. Cardiac pacer noted with lead tip over the right atrium and right ventricle. Heart size stable. 2. Low lung volumes with mild basilar atelectasis . Previously identified infiltrate right lung base has largely cleared. Electronically Signed   By: Maisie Fus  Register   On: 05/27/2017 16:26   Ct Head Wo Contrast  Result Date: 05/27/2017 CLINICAL DATA:  Hypotension and weakness today. EXAM: CT HEAD WITHOUT CONTRAST TECHNIQUE: Contiguous axial images were obtained from the base of the skull through the vertex without intravenous contrast. COMPARISON:  Head CT scan 06/09/2014. FINDINGS: Brain: There is cortical atrophy and chronic microvascular ischemic change. Encephalomalacia right frontal lobe subjacent to a craniotomy defect is unchanged. No evidence of acute abnormality including hemorrhage, infarct, mass lesion, mass effect, midline shift or abnormal extra-axial fluid collection. No hydrocephalus or pneumocephalus. Vascular: No hyperdense vessel or unexpected calcification. Skull: No fracture.  Craniotomy defect noted.  Sinuses/Orbits: Extensive mucosal thickening in the maxillary sinuses bilaterally is partially imaged and chronic. Other: None. IMPRESSION: No acute abnormality. Atrophy and chronic microvascular ischemic change. Encephalomalacia right frontal lobe subjacent to a craniotomy defect. Chronic bilateral maxillary sinus disease. Electronically Signed   By: Drusilla Kanner M.D.   On: 05/27/2017 16:35        Scheduled Meds: . enoxaparin (LOVENOX) injection  40 mg Subcutaneous Q24H  . feeding supplement (ENSURE ENLIVE)  237 mL Oral BID BM  . feeding supplement (PRO-STAT SUGAR  FREE 64)  30 mL Oral BID  . multivitamin  15 mL Oral Daily  . nutrition supplement (JUVEN)  1 packet Oral BID BM  . nystatin   Topical Daily   Continuous Infusions: . piperacillin-tazobactam (ZOSYN)  IV Stopped (05/29/17 1357)  . vancomycin Stopped (05/29/17 1054)     LOS: 2 days    Time spent: 35 minutes. Greater than 50% of this time was spent in direct contact with the patient coordinating care.     Chaya JanEstela Hernandez Acosta, MD [facility] Pager [phone]  If 7PM-7AM, please contact night-coverage [email or web address] Password [email or web address] 05/29/2017, 3:41 PM

## 2017-05-30 LAB — URINE CULTURE

## 2017-05-30 MED ORDER — CHLORHEXIDINE GLUCONATE CLOTH 2 % EX PADS
6.0000 | MEDICATED_PAD | Freq: Every day | CUTANEOUS | Status: AC
  Administered 2017-05-30 – 2017-06-03 (×4): 6 via TOPICAL

## 2017-05-30 MED ORDER — MUPIROCIN 2 % EX OINT
1.0000 "application " | TOPICAL_OINTMENT | Freq: Two times a day (BID) | CUTANEOUS | Status: AC
  Administered 2017-05-30 – 2017-06-03 (×10): 1 via NASAL
  Filled 2017-05-30: qty 22

## 2017-05-30 MED ORDER — SODIUM CHLORIDE 0.9 % IV BOLUS (SEPSIS)
500.0000 mL | Freq: Once | INTRAVENOUS | Status: AC
  Administered 2017-05-30: 500 mL via INTRAVENOUS

## 2017-05-30 MED ORDER — POTASSIUM CHLORIDE 10 MEQ/100ML IV SOLN
10.0000 meq | INTRAVENOUS | Status: AC
  Administered 2017-05-30 (×4): 10 meq via INTRAVENOUS
  Filled 2017-05-30 (×5): qty 100

## 2017-05-30 MED ORDER — MAGNESIUM SULFATE 2 GM/50ML IV SOLN
2.0000 g | Freq: Once | INTRAVENOUS | Status: AC
  Administered 2017-05-30: 2 g via INTRAVENOUS
  Filled 2017-05-30: qty 50

## 2017-05-30 NOTE — Progress Notes (Signed)
PROGRESS NOTE    [name] Urias  ZOX:[id number] DOB: [birth year] DOA: 05/27/2017 PCP: Gareth MorganKnowlton, Steve, MD     Brief Narrative:  Patient is an 83 year old man admitted from home on 12/11 due to weakness.  In the ED patient was noticed to be hypotensive, UA was negative, sodium was 114, patient was admitted with sepsis and hypotension.   Assessment & Plan:   Principal Problem:   Weak Active Problems:   Hypotension   Sepsis (HCC)   Hyponatremia   Sacral ulcer (HCC)   Lethargy   Encounter for hospice care discussion   Hypotension -I think this is mainly due to dehydration/failure to thrive and less likely sepsis. -Blood pressure remains low with systolics in the upper 80s to low 90s.  We will give additional fluid bolus today.  Has never been pressor dependent. -Patient's prognosis remains very poor, palliative care is involved.  A case with [employer]PS has been filed and will be presented before the judge on Monday for interim guardianship.  For now patient remains full code. -For now we will continue broad-spectrum antibiotic therapy with vancomycin and Zosyn, although I doubt this is sepsis and believe this is more chronic malnutrition and dehydration.  Consider weaning off antibiotics soon.  WBC count is decreasing.  Acute metabolic encephalopathy -Due to hypotension, dehydration as well as possible sepsis. -Close to baseline, able to answer questions appropriately although still has poor insight into the severity of his illness.  Stage IV sacral decubitus ulcers -Multiple including sacrum and mid to lower back. -Have requested wound care consultation.  Severe protein caloric malnutrition -Nutrition consultation has been requested.  Hyponatremia -This is hypovolemic in nature. -Improving with IV fluids, continue.  Hypokalemia -Replace orally.    Hypomagnesemia -Mag today is 1.5, replace IV and recheck in a.m.  Paroxysmal atrial fibrillation -Rate controlled at  present.  History of hypertension -Now hypotensive; all meds on hold.    DVT prophylaxis: Lovenox Code Status: Full code for now Family Communication: Discussed with son Tawanna Coolerodd at bedside Disposition Plan: Patient cannot make informed decisions.  He does not understand his prognosis and consequences of his medical illnesses.  His prognosis is very poor, anticipate weeks at most.  Strongly recommend hospice care in this patient and DNR.  Son seems tangential, jumps from subject to subject and is unable to focus on conversation.  Continues to perseverate on the fact that he wants to take his father home despite enough evidence to suggest that he is currently overwhelmed with his father's care.  We will continue to discuss these issues with him on a daily basis.  This patient remains critically ill.  Consultants:   Palliative care  Procedures:   None  Antimicrobials:  Anti-infectives (From admission, onward)   Start     Dose/Rate Route Frequency Ordered Stop   05/28/17 1100  piperacillin-tazobactam (ZOSYN) IVPB 3.375 g     3.375 g 12.5 mL/hr over 240 Minutes Intravenous Every 8 hours 05/28/17 0743     05/28/17 1000  vancomycin (VANCOCIN) 500 mg in sodium chloride 0.9 % 100 mL IVPB     500 mg 100 mL/hr over 60 Minutes Intravenous Every 12 hours 05/28/17 0743     05/28/17 0030  piperacillin-tazobactam (ZOSYN) IVPB 3.375 g     3.375 g 12.5 mL/hr over 240 Minutes Intravenous  Once 05/28/17 0019 05/28/17 0642   05/27/17 1700  vancomycin (VANCOCIN) IVPB 1000 mg/200 mL premix     1,000 mg 200 mL/hr over 60 Minutes Intravenous  Once 05/27/17 1652 05/27/17 1916   05/27/17 1700  piperacillin-tazobactam (ZOSYN) IVPB 3.375 g     3.375 g 100 mL/hr over 30 Minutes Intravenous  Once 05/27/17 1652 05/27/17 1744       Subjective: Lying in bed, opens eyes to voice, states he is very very weak.  Objective: Vitals:   05/30/17 1300 05/30/17 1400 05/30/17 1500 05/30/17 1600  BP: 99/74 (!) 87/71  (!) 89/55 (!) 82/71  Pulse: (!) 43  73 70  Resp: 14 12 13 16   Temp:      TempSrc:      SpO2: 94%  100% 100%  Weight:      Height:        Intake/Output Summary (Last 24 hours) at 05/30/2017 1710 Last data filed at 05/30/2017 0500 Gross per 24 hour  Intake 1000 ml  Output 575 ml  Net 425 ml   Filed Weights   05/28/17 0500 05/29/17 0500 05/30/17 0400  Weight: 58.7 kg (129 lb 6.6 oz) 61 kg (134 lb 7.7 oz) 60.8 kg (134 lb 0.6 oz)    Examination:  General exam: Awake, drowsy Respiratory system: Clear to auscultation. Respiratory effort normal. Cardiovascular system:RRR. No murmurs, rubs, gallops. Gastrointestinal system: Abdomen is nondistended, soft and nontender. No organomegaly or masses felt. Normal bowel sounds heard. Central nervous system: Unable to be fully assessed given drowsiness Extremities: No C/C/E, +pedal pulses Skin: Multiple stage IV decubitus ulcers of sacrum and mid to lower back and shoulder. Psychiatry: Unable to fully assess given current mental state    Data Reviewed: I have personally reviewed following labs and imaging studies  CBC: Recent Labs  Lab 05/27/17 1547 05/28/17 0523 05/29/17 0456  WBC 31.2* 23.2* 13.1*  NEUTROABS 29.9*  --   --   HGB 10.4* 9.1* 8.4*  HCT 31.1* 27.1* 26.1*  MCV 80.4 80.9 82.9  PLT 216 177 129*   Basic Metabolic Panel: Recent Labs  Lab 05/27/17 1547 05/28/17 0523 05/29/17 0456 05/29/17 1701  NA 114* 121* 124*  --   K 5.4* 4.2 3.0*  --   CL 82* 90* 97*  --   CO2 19* 21* 20*  --   GLUCOSE 90 104* 117*  --   BUN 39* 35* 29*  --   CREATININE 1.09 0.83 0.53*  --   CALCIUM 8.4* 8.2* 7.8*  --   MG  --   --   --  1.5*   GFR: Estimated Creatinine Clearance: 60.2 mL/min (A) (by C-G formula based on SCr of 0.53 mg/dL (L)). Liver Function Tests: Recent Labs  Lab 05/27/17 1547 05/28/17 0523 05/29/17 0456  AST 19 16 20   ALT 14* 13* 12*  ALKPHOS 106 83 74  BILITOT 1.1 0.7 0.6  PROT 5.9* 4.9* 4.3*  ALBUMIN  2.6* 2.1* 1.9*   No results for input(s): LIPASE, AMYLASE in the last 168 hours. No results for input(s): AMMONIA in the last 168 hours. Coagulation Profile: No results for input(s): INR, PROTIME in the last 168 hours. Cardiac Enzymes: Recent Labs  Lab 05/27/17 1547 05/27/17 2349 05/28/17 0523 05/28/17 1120  TROPONINI <0.03 <0.03 <0.03 <0.03   BNP (last 3 results) No results for input(s): PROBNP in the last 8760 hours. HbA1C: No results for input(s): HGBA1C in the last 72 hours. CBG: No results for input(s): GLUCAP in the last 168 hours. Lipid Profile: No results for input(s): CHOL, HDL, LDLCALC, TRIG, CHOLHDL, LDLDIRECT in the last 72 hours. Thyroid Function Tests: Recent Labs    05/27/17 2006  TSH 1.711   Anemia Panel: No results for input(s): VITAMINB12, FOLATE, FERRITIN, TIBC, IRON, RETICCTPCT in the last 72 hours. Urine analysis:    Component Value Date/Time   COLORURINE YELLOW 05/27/2017 1543   APPEARANCEUR TURBID (A) 05/27/2017 1543   LABSPEC 1.023 05/27/2017 1543   PHURINE 8.0 05/27/2017 1543   GLUCOSEU NEGATIVE 05/27/2017 1543   HGBUR NEGATIVE 05/27/2017 1543   BILIRUBINUR SMALL (A) 05/27/2017 1543   KETONESUR NEGATIVE 05/27/2017 1543   PROTEINUR >=300 (A) 05/27/2017 1543   UROBILINOGEN 0.2 03/14/2015 1515   NITRITE NEGATIVE 05/27/2017 1543   LEUKOCYTESUR MODERATE (A) 05/27/2017 1543   Sepsis Labs: @LABRCNTIP (procalcitonin:4,lacticidven:4)  ) Recent Results (from the past 240 hour(s))  Culture, Urine     Status: Abnormal   Collection Time: 05/27/17  5:29 PM  Result Value Ref Range Status   Specimen Description URINE, CATHETERIZED  Final   Special Requests NONE  Final   Culture MULTIPLE SPECIES PRESENT, SUGGEST RECOLLECTION (A)  Final   Report Status 05/30/2017 FINAL  Final  Blood culture (routine x 2)     Status: None (Preliminary result)   Collection Time: 05/27/17  8:02 PM  Result Value Ref Range Status   Specimen Description BLOOD RIGHT ARM   Final   Special Requests   Final    BOTTLES DRAWN AEROBIC AND ANAEROBIC Blood Culture results may not be optimal due to an inadequate volume of blood received in culture bottles   Culture NO GROWTH 3 DAYS  Final   Report Status PENDING  Incomplete  Blood culture (routine x 2)     Status: None (Preliminary result)   Collection Time: 05/27/17  8:06 PM  Result Value Ref Range Status   Specimen Description BLOOD RIGHT HAND  Final   Special Requests   Final    BOTTLES DRAWN AEROBIC AND ANAEROBIC Blood Culture results may not be optimal due to an inadequate volume of blood received in culture bottles   Culture NO GROWTH 3 DAYS  Final   Report Status PENDING  Incomplete  MRSA PCR Screening     Status: Abnormal   Collection Time: 05/27/17 10:51 PM  Result Value Ref Range Status   MRSA by PCR POSITIVE (A) NEGATIVE Final    Comment:        The GeneXpert MRSA Assay (FDA approved for NASAL specimens only), is one component of a comprehensive MRSA colonization surveillance program. It is not intended to diagnose MRSA infection nor to guide or monitor treatment for MRSA infections. RESULT CALLED TO, READ BACK BY AND VERIFIED WITH: ALLEN N. AT 0900 ON [id number] BY THOMPSON S.          Radiology Studies: No results found.      Scheduled Meds: . Chlorhexidine Gluconate Cloth  6 each Topical Q0600  . enoxaparin (LOVENOX) injection  40 mg Subcutaneous Q24H  . feeding supplement (PRO-STAT SUGAR FREE 64)  30 mL Oral BID  . multivitamin  15 mL Oral Daily  . mupirocin ointment  1 application Nasal BID  . nutrition supplement (JUVEN)  1 packet Oral BID BM  . nystatin   Topical Daily   Continuous Infusions: . piperacillin-tazobactam (ZOSYN)  IV Stopped (05/30/17 1404)  . vancomycin Stopped (05/30/17 1120)     LOS: 3 days    Time spent: 25 minutes. Greater than 50% of this time was spent in direct contact with the patient coordinating care.     Chaya Jan, MD [facility] Pager [phone]  If 7PM-7AM, please  contact night-coverage [email or web address] Password [email or web address] 05/30/2017, 5:10 PM

## 2017-05-30 NOTE — Progress Notes (Signed)
Nutrition Follow-up  DOCUMENTATION CODES:  Severe malnutrition in context of chronic illness (vs in context of social or environmental circumstances)  INTERVENTION:  D/C Ensure Enlive po BID, each supplement provides 350 kcal and 20 grams of protein  Continue 30 mL Prostat BID, each supplement provides 100 kcal and 15 grams of protein.  Continue to try Juven modular to provide arginine, glutamine, Hmb to help promote tissue granulation  MVI with minerals to provide adequate substrate for wound healing. May benefit from further Zinc/Vit C supplementation given large amounts of drainage.   Will add magic cup BID and pancakes in mornings.   NUTRITION DIAGNOSIS:  Severe Malnutrition related to poor appetite, social / environmental circumstances (question adequate care at home), chronic illness (dementia may be contributing) as evidenced by severe muscle and severe fat depletion  -ongoing  GOAL:  Patient will meet greater than or equal to 90% of their needs   -not met  MONITOR:  PO intake, Supplement acceptance, Diet advancement, Labs, Weight trends, Skin, I & O's, GOC  ASSESSMENT:  83 y/o PMHx Dementia, HTN, Afib, Tachy/brady s/p pacer, OSA, tremor, B12 deficiency. Brought to ED by family due to increased lethargy/weakness. Worked up for hypotension, hyperkalemia,  severe hyponatremia (114) and likely sepsis. Has multiple bed sores.   Interval Hx: Palliative Care has met w/ son. Difficult social situation/awareness of severity of health. Remains full code. Poor prognosis.   Given patient's dementia, spoke with RN. She stated the patient actually ate 100% of his pancakes today. She further states the patient is consuming the Prostat and 25% of the Juven, but not the Ensure. Will d/c Ensure and try Magic cup. Patient is largely fed by the son.   IF aggressive care is desired, it is this RDs clinical opinion that the patient is not consuming sufficient calories or protein for wound  healing, if does not continue to improve, feeding tube would be indicated to provide optimal nutrition support.   Weight history. He appears to have been 135 lbs in May and has not lost a significant amount of weight since then. PLease note the patient was weighing ~190 lbs 3 years ago.  Has lost 1/3 of his body weight since.   Continue to monitor intake, GOC, wounds.   Labs: Na up to 124, K corrected, Renal labs continue to improve, Mag 1.5 Albumin 2.1->1.9, k:3.0, WBC improved 23.2 -> 13.1, h/h:8.4/26.1 Meds: Ensure Enlive, Prostat, IVF, IV Abx, MVI with min, Juven,   Recent Labs  Lab 05/27/17 1547 05/28/17 0523 05/29/17 0456 05/29/17 1701  NA 114* 121* 124*  --   K 5.4* 4.2 3.0*  --   CL 82* 90* 97*  --   CO2 19* 21* 20*  --   BUN 39* 35* 29*  --   CREATININE 1.09 0.83 0.53*  --   CALCIUM 8.4* 8.2* 7.8*  --   MG  --   --   --  1.5*  GLUCOSE 90 104* 117*  --    NUTRITION - FOCUSED PHYSICAL EXAM:   Most Recent Value  Orbital Region  No depletion  Upper Arm Region  Severe depletion  Thoracic and Lumbar Region  Severe depletion  Buccal Region  Mild depletion  Temple Region  Moderate depletion  Clavicle Bone Region  Moderate depletion  Clavicle and Acromion Bone Region  Moderate depletion  Scapular Bone Region  Unable to assess  Dorsal Hand  Unable to assess  Patellar Region  Severe depletion  Anterior Thigh Region  Severe depletion  Posterior Calf Region  Severe depletion  Edema (RD Assessment)  Mild  Hair  Reviewed  Eyes  Reviewed  Mouth  Reviewed  Skin  Reviewed  Nails  Reviewed     Diet Order:  Diet Heart Room service appropriate? Yes; Fluid consistency: Thin  EDUCATION NEEDS:  No education needs have been identified at this time  Skin:  Please see [place] note for specifics.  R hip: DTI w/o drainage Right IT PU stage 4 w/ mod drainage Sacral PU stage 4 with large amount drainage R scapulae: Skin tear, Partial thickness w/o drainage R thoracic Chest: Stage 4 w/  large amount drainage, directly, palpable rib, 40% necrotic tissue R Lateral ankle: 2x stage II PUs w/o drainage  Last BM:  Unknown  Height:  Ht Readings from Last 1 Encounters:  05/27/17 '5\' 6"'  (1.676 m)   Weight:  Wt Readings from Last 1 Encounters:  05/30/17 134 lb 0.6 oz (60.8 kg)   Wt Readings from Last 10 Encounters:  05/30/17 134 lb 0.6 oz (60.8 kg)  08/24/16 135 lb 2.3 oz (61.3 kg)  05/11/16 136 lb 11 oz (62 kg)  05/05/16 150 lb 5.7 oz (68.2 kg)  05/15/15 140 lb 10.5 oz (63.8 kg)  05/09/15 193 lb (87.5 kg)  07/08/14 193 lb 12.8 oz (87.9 kg)  06/16/14 189 lb 9.5 oz (86 kg)  06/09/14 179 lb 16 oz (81.6 kg)  08/26/13 186 lb 6.4 oz (84.6 kg)   Ideal Body Weight:  64.55 kg  BMI:  Body mass index is 21.63 kg/m.  Estimated Nutritional Needs:  Kcal:  2000-2200 (34-38 kcal/kg bw) Protein:  100-115 g pro (1.7-2g/kg bw) Fluid:  2-2.2 L fluid  Burtis Junes RD, LDN, CNSC Clinical Nutrition Pager: [phone] 05/30/2017 12:39 PM

## 2017-05-30 NOTE — Clinical Social Work Note (Signed)
Jon [name] with [employer] advised that an interim hearing will be Monday at 11 a.m. For interim guardianship.  She stated that the sheriff will be coming to serve him.    Jon [name], Jon ChinaHeather D, LCSW

## 2017-05-30 NOTE — Progress Notes (Signed)
Son in room with pt, nothing needed at this time.

## 2017-05-30 NOTE — Progress Notes (Signed)
Pt in bed resting, K+ stopped and new bag started, nothing needed at this time.

## 2017-05-31 DIAGNOSIS — T68XXXA Hypothermia, initial encounter: Secondary | ICD-10-CM | POA: Diagnosis present

## 2017-05-31 LAB — CBC
HEMATOCRIT: 27.9 % — AB (ref 39.0–52.0)
HEMOGLOBIN: 9 g/dL — AB (ref 13.0–17.0)
MCH: 26.8 pg (ref 26.0–34.0)
MCHC: 32.3 g/dL (ref 30.0–36.0)
MCV: 83 fL (ref 78.0–100.0)
PLATELETS: 115 10*3/uL — AB (ref 150–400)
RBC: 3.36 MIL/uL — AB (ref 4.22–5.81)
RDW: 17.4 % — ABNORMAL HIGH (ref 11.5–15.5)
WBC: 10 10*3/uL (ref 4.0–10.5)

## 2017-05-31 LAB — BASIC METABOLIC PANEL
Anion gap: 6 (ref 5–15)
BUN: 32 mg/dL — ABNORMAL HIGH (ref 6–20)
CHLORIDE: 98 mmol/L — AB (ref 101–111)
CO2: 21 mmol/L — AB (ref 22–32)
CREATININE: 0.53 mg/dL — AB (ref 0.61–1.24)
Calcium: 8 mg/dL — ABNORMAL LOW (ref 8.9–10.3)
GFR calc non Af Amer: >60 mL/min (ref >60)
Glucose, Bld: 103 mg/dL — ABNORMAL HIGH (ref 65–99)
POTASSIUM: 4.7 mmol/L (ref 3.5–5.1)
SODIUM: 125 mmol/L — AB (ref 135–145)

## 2017-05-31 LAB — MAGNESIUM: Magnesium: 2 mg/dL (ref 1.7–2.4)

## 2017-05-31 MED ORDER — POTASSIUM CHLORIDE 10 MEQ/100ML IV SOLN
INTRAVENOUS | Status: AC
  Filled 2017-05-31: qty 100

## 2017-05-31 MED ORDER — SODIUM CHLORIDE 0.9 % IV SOLN
INTRAVENOUS | Status: AC
  Administered 2017-05-31 – 2017-06-01 (×2): via INTRAVENOUS

## 2017-05-31 MED ORDER — DOXYCYCLINE HYCLATE 100 MG PO TABS
100.0000 mg | ORAL_TABLET | Freq: Two times a day (BID) | ORAL | Status: DC
  Administered 2017-05-31 – 2017-06-01 (×3): 100 mg via ORAL
  Filled 2017-05-31 (×3): qty 1

## 2017-05-31 NOTE — Progress Notes (Addendum)
PROGRESS NOTE    [name] Fabiano  ZOX:[id number] DOB: [birth year] DOA: 05/27/2017 PCP: Gareth MorganKnowlton, Steve, MD   Brief Narrative:  Patient is an [age] year old man admitted from home on 12/11 due to weakness.  In the ED patient was noticed to be hypotensive, UA was negative, sodium was 114, patient was admitted with sepsis and hypotension.  Assessment & Plan:   Principal Problem:   Weak Active Problems:   Sepsis (HCC)   Hyponatremia   Hypotension   Sacral ulcer (HCC)   Lethargy   Encounter for hospice care discussion  Hypotension -I think this is mainly due to dehydration/failure to thrive and less likely sepsis. -Blood pressure slowly improving.  Has not required pressors. -Patient's prognosis remains very poor, palliative care is involved.  A case with [employer]PS has been filed and will be presented before the judge on Monday for interim guardianship.  For now patient remains full code. - WBC count has trended down to normal.  Will de-escalate antibiotics 12/15.    Acute metabolic encephalopathy -Improving slowly.  Due to hypotension, dehydration as well as possible sepsis. -Appears to be back to baseline, able to answer questions appropriately although still has poor insight into the severity of his illness.  Stage IV sacral decubitus ulcers -Multiple including sacrum and mid to lower back. -wound care consultation appreciated - see orders.  Severe protein caloric malnutrition -Nutrition consultation appreciated.  Hyponatremia -This is hypovolemic in nature. -Improving with IV fluids, continue.  Hypokalemia -Replace orally.    Hypomagnesemia -Repleted.   Hypothermia - slowly improving. Multifactorial causes  Paroxysmal atrial fibrillation -Rate controlled at present.  History of hypertension -Now hypotensive; all meds on hold.    DVT prophylaxis: Lovenox Code Status: Full code for now Family Communication: Discussed with son Tawanna Coolerodd at bedside  Disposition Plan:  Patient cannot make informed decisions.  He does not understand his prognosis and consequences of his medical illnesses.  His prognosis is very poor, anticipate weeks at most.  Strongly recommend hospice care in this patient and DNR.  Son seems tangential, jumps from subject to subject and is unable to focus on conversation.  Continues to perseverate on the fact that he wants to take his father home despite enough evidence to suggest that he is currently overwhelmed with his father's care.  We will continue to discuss these issues with him on a daily basis.  This patient remains critically ill.  Consultants:   Palliative care  Procedures:   None  Antimicrobials:  Anti-infectives (From admission, onward)   Start     Dose/Rate Route Frequency Ordered Stop   05/28/17 1100  piperacillin-tazobactam (ZOSYN) IVPB 3.375 g     3.375 g 12.5 mL/hr over 240 Minutes Intravenous Every 8 hours 05/28/17 0743     05/28/17 1000  vancomycin (VANCOCIN) 500 mg in sodium chloride 0.9 % 100 mL IVPB     500 mg 100 mL/hr over 60 Minutes Intravenous Every 12 hours 05/28/17 0743     05/28/17 0030  piperacillin-tazobactam (ZOSYN) IVPB 3.375 g     3.375 g 12.5 mL/hr over 240 Minutes Intravenous  Once 05/28/17 0019 05/28/17 0642   05/27/17 1700  vancomycin (VANCOCIN) IVPB 1000 mg/200 mL premix     1,000 mg 200 mL/hr over 60 Minutes Intravenous  Once 05/27/17 1652 05/27/17 1916   05/27/17 1700  piperacillin-tazobactam (ZOSYN) IVPB 3.375 g     3.375 g 100 mL/hr over 30 Minutes Intravenous  Once 05/27/17 1652 05/27/17 1744     Subjective:  Pt says that he is feeling a lot better today.    Objective: Vitals:   05/31/17 0200 05/31/17 0300 05/31/17 0400 05/31/17 0500  BP: 102/74 106/80 107/90 111/72  Pulse: 71 76 65 77  Resp: 11 12 10 13   Temp:      TempSrc:      SpO2: 100% 100% 100% 100%  Weight:      Height:        Intake/Output Summary (Last 24 hours) at 05/31/2017 0705 Last data filed at 05/30/2017  2053 Gross per 24 hour  Intake 480 ml  Output 300 ml  Net 180 ml   Filed Weights   05/28/17 0500 05/29/17 0500 05/30/17 0400  Weight: 58.7 kg (129 lb 6.6 oz) 61 kg (134 lb 7.7 oz) 60.8 kg (134 lb 0.6 oz)   Examination:  General exam: Awake, alert, NAD. Cooperative.  Respiratory system: Clear to auscultation. Respiratory effort normal. Cardiovascular system. Normal s1, s2 sounds.  No murmurs, rubs, gallops. Gastrointestinal system: Abdomen is nondistended, soft and nontender. No organomegaly or masses felt. Normal bowel sounds heard.  Ostomy full of soft brown stool.  Central nervous system: No focal findings.  Extremities: No C/C/E, +pedal pulses Skin: Multiple stage IV decubitus ulcers of sacrum and mid to lower back and shoulder. Psychiatry: flat affect.   Data Reviewed: I have personally reviewed following labs and imaging studies  CBC: Recent Labs  Lab 05/27/17 1547 05/28/17 0523 05/29/17 0456 05/31/17 0438  WBC 31.2* 23.2* 13.1* 10.0  NEUTROABS 29.9*  --   --   --   HGB 10.4* 9.1* 8.4* 9.0*  HCT 31.1* 27.1* 26.1* 27.9*  MCV 80.4 80.9 82.9 83.0  PLT 216 177 129* PENDING   Basic Metabolic Panel: Recent Labs  Lab 05/27/17 1547 05/28/17 0523 05/29/17 0456 05/29/17 1701 05/31/17 0438  NA 114* 121* 124*  --  125*  K 5.4* 4.2 3.0*  --  4.7  CL 82* 90* 97*  --  98*  CO2 19* 21* 20*  --  21*  GLUCOSE 90 104* 117*  --  103*  BUN 39* 35* 29*  --  32*  CREATININE 1.09 0.83 0.53*  --  0.53*  CALCIUM 8.4* 8.2* 7.8*  --  8.0*  MG  --   --   --  1.5* 2.0   GFR: Estimated Creatinine Clearance: 60.2 mL/min (A) (by C-G formula based on SCr of 0.53 mg/dL (L)). Liver Function Tests: Recent Labs  Lab 05/27/17 1547 05/28/17 0523 05/29/17 0456  AST 19 16 20   ALT 14* 13* 12*  ALKPHOS 106 83 74  BILITOT 1.1 0.7 0.6  PROT 5.9* 4.9* 4.3*  ALBUMIN 2.6* 2.1* 1.9*   No results for input(s): LIPASE, AMYLASE in the last 168 hours. No results for input(s): AMMONIA in the  last 168 hours. Coagulation Profile: No results for input(s): INR, PROTIME in the last 168 hours. Cardiac Enzymes: Recent Labs  Lab 05/27/17 1547 05/27/17 2349 05/28/17 0523 05/28/17 1120  TROPONINI <0.03 <0.03 <0.03 <0.03   BNP (last 3 results) No results for input(s): PROBNP in the last 8760 hours. HbA1C: No results for input(s): HGBA1C in the last 72 hours. CBG: No results for input(s): GLUCAP in the last 168 hours. Lipid Profile: No results for input(s): CHOL, HDL, LDLCALC, TRIG, CHOLHDL, LDLDIRECT in the last 72 hours. Thyroid Function Tests: No results for input(s): TSH, T4TOTAL, FREET4, T3FREE, THYROIDAB in the last 72 hours. Anemia Panel: No results for input(s): VITAMINB12, FOLATE, FERRITIN, TIBC, IRON, RETICCTPCT  in the last 72 hours. Urine analysis:    Component Value Date/Time   COLORURINE YELLOW 05/27/2017 1543   APPEARANCEUR TURBID (A) 05/27/2017 1543   LABSPEC 1.023 05/27/2017 1543   PHURINE 8.0 05/27/2017 1543   GLUCOSEU NEGATIVE 05/27/2017 1543   HGBUR NEGATIVE 05/27/2017 1543   BILIRUBINUR SMALL (A) 05/27/2017 1543   KETONESUR NEGATIVE 05/27/2017 1543   PROTEINUR >=300 (A) 05/27/2017 1543   UROBILINOGEN 0.2 03/14/2015 1515   NITRITE NEGATIVE 05/27/2017 1543   LEUKOCYTESUR MODERATE (A) 05/27/2017 1543   Recent Results (from the past 240 hour(s))  Culture, Urine     Status: Abnormal   Collection Time: 05/27/17  5:29 PM  Result Value Ref Range Status   Specimen Description URINE, CATHETERIZED  Final   Special Requests NONE  Final   Culture MULTIPLE SPECIES PRESENT, SUGGEST RECOLLECTION (A)  Final   Report Status 05/30/2017 FINAL  Final  Blood culture (routine x 2)     Status: None (Preliminary result)   Collection Time: 05/27/17  8:02 PM  Result Value Ref Range Status   Specimen Description BLOOD RIGHT ARM  Final   Special Requests   Final    BOTTLES DRAWN AEROBIC AND ANAEROBIC Blood Culture results may not be optimal due to an inadequate volume of  blood received in culture bottles   Culture NO GROWTH 4 DAYS  Final   Report Status PENDING  Incomplete  Blood culture (routine x 2)     Status: None (Preliminary result)   Collection Time: 05/27/17  8:06 PM  Result Value Ref Range Status   Specimen Description BLOOD RIGHT HAND  Final   Special Requests   Final    BOTTLES DRAWN AEROBIC AND ANAEROBIC Blood Culture results may not be optimal due to an inadequate volume of blood received in culture bottles   Culture NO GROWTH 4 DAYS  Final   Report Status PENDING  Incomplete  MRSA PCR Screening     Status: Abnormal   Collection Time: 05/27/17 10:51 PM  Result Value Ref Range Status   MRSA by PCR POSITIVE (A) NEGATIVE Final    Comment:        The GeneXpert MRSA Assay (FDA approved for NASAL specimens only), is one component of a comprehensive MRSA colonization surveillance program. It is not intended to diagnose MRSA infection nor to guide or monitor treatment for MRSA infections. RESULT CALLED TO, READ BACK BY AND VERIFIED WITH: ALLEN N. AT 0900 ON [id number] BY THOMPSON S.     Radiology Studies: No results found.  Scheduled Meds: . Chlorhexidine Gluconate Cloth  6 each Topical Q0600  . enoxaparin (LOVENOX) injection  40 mg Subcutaneous Q24H  . feeding supplement (PRO-STAT SUGAR FREE 64)  30 mL Oral BID  . multivitamin  15 mL Oral Daily  . mupirocin ointment  1 application Nasal BID  . nutrition supplement (JUVEN)  1 packet Oral BID BM  . nystatin   Topical Daily   Continuous Infusions: . piperacillin-tazobactam (ZOSYN)  IV 3.375 g (05/31/17 0332)  . potassium chloride Stopped (05/30/17 2250)  . vancomycin Stopped (05/30/17 2248)     LOS: 4 days    Critical Care Time spent: 32 minutes. Greater than 50% of this time was spent in direct contact with the patient coordinating care.  Standley Dakinslanford Kameo Bains, MD [facility] Pager [phone]  If 7PM-7AM, please contact night-coverage [email or web address] Password  [email or web address] 05/31/2017, 7:05 AM

## 2017-06-01 DIAGNOSIS — E860 Dehydration: Secondary | ICD-10-CM

## 2017-06-01 DIAGNOSIS — T68XXXD Hypothermia, subsequent encounter: Secondary | ICD-10-CM

## 2017-06-01 DIAGNOSIS — I480 Paroxysmal atrial fibrillation: Secondary | ICD-10-CM

## 2017-06-01 LAB — CULTURE, BLOOD (ROUTINE X 2)
Culture: NO GROWTH
Culture: NO GROWTH

## 2017-06-01 LAB — BASIC METABOLIC PANEL
Anion gap: 6 (ref 5–15)
BUN: 30 mg/dL — AB (ref 6–20)
CO2: 21 mmol/L — ABNORMAL LOW (ref 22–32)
CREATININE: 0.5 mg/dL — AB (ref 0.61–1.24)
Calcium: 8 mg/dL — ABNORMAL LOW (ref 8.9–10.3)
Chloride: 102 mmol/L (ref 101–111)
Glucose, Bld: 96 mg/dL (ref 65–99)
Potassium: 4.5 mmol/L (ref 3.5–5.1)
SODIUM: 129 mmol/L — AB (ref 135–145)

## 2017-06-01 LAB — CBC
HCT: 29.2 % — ABNORMAL LOW (ref 39.0–52.0)
Hemoglobin: 9.6 g/dL — ABNORMAL LOW (ref 13.0–17.0)
MCH: 27.3 pg (ref 26.0–34.0)
MCHC: 32.9 g/dL (ref 30.0–36.0)
MCV: 83 fL (ref 78.0–100.0)
PLATELETS: 82 10*3/uL — AB (ref 150–400)
RBC: 3.52 MIL/uL — AB (ref 4.22–5.81)
RDW: 17.5 % — AB (ref 11.5–15.5)
WBC: 8.2 10*3/uL (ref 4.0–10.5)

## 2017-06-01 LAB — MAGNESIUM: MAGNESIUM: 1.9 mg/dL (ref 1.7–2.4)

## 2017-06-01 MED ORDER — NOREPINEPHRINE BITARTRATE 1 MG/ML IV SOLN
0.0000 ug/min | INTRAVENOUS | Status: DC
  Administered 2017-06-01: 2 ug/min via INTRAVENOUS
  Administered 2017-06-02: 6 ug/min via INTRAVENOUS
  Administered 2017-06-02: 7 ug/min via INTRAVENOUS
  Administered 2017-06-03: 1 ug/min via INTRAVENOUS
  Administered 2017-06-04: 2 ug/min via INTRAVENOUS
  Filled 2017-06-01 (×4): qty 4

## 2017-06-01 MED ORDER — SODIUM CHLORIDE 0.9 % IV SOLN
INTRAVENOUS | Status: AC
  Administered 2017-06-01 – 2017-06-02 (×2): via INTRAVENOUS

## 2017-06-01 MED ORDER — CARVEDILOL 3.125 MG PO TABS
3.1250 mg | ORAL_TABLET | Freq: Two times a day (BID) | ORAL | Status: DC
  Administered 2017-06-01: 3.125 mg via ORAL
  Filled 2017-06-01: qty 1

## 2017-06-01 MED ORDER — DEXTROSE 5 % IV SOLN
1.0000 g | Freq: Three times a day (TID) | INTRAVENOUS | Status: DC
  Administered 2017-06-02 – 2017-06-05 (×11): 1 g via INTRAVENOUS
  Filled 2017-06-01 (×15): qty 1

## 2017-06-01 MED ORDER — NOREPINEPHRINE 4 MG/250ML-% IV SOLN
INTRAVENOUS | Status: AC
  Filled 2017-06-01: qty 250

## 2017-06-01 MED ORDER — SODIUM CHLORIDE 0.9 % IV BOLUS (SEPSIS)
750.0000 mL | Freq: Once | INTRAVENOUS | Status: AC
  Administered 2017-06-01: 750 mL via INTRAVENOUS

## 2017-06-01 MED ORDER — DEXTROSE 5 % IV SOLN
2.0000 g | Freq: Once | INTRAVENOUS | Status: AC
  Administered 2017-06-01: 2 g via INTRAVENOUS
  Filled 2017-06-01 (×2): qty 2

## 2017-06-01 MED ORDER — VANCOMYCIN HCL IN DEXTROSE 750-5 MG/150ML-% IV SOLN
750.0000 mg | Freq: Two times a day (BID) | INTRAVENOUS | Status: DC
  Administered 2017-06-02 – 2017-06-03 (×4): 750 mg via INTRAVENOUS
  Filled 2017-06-01 (×5): qty 150

## 2017-06-01 MED ORDER — ADULT MULTIVITAMIN W/MINERALS CH
1.0000 | ORAL_TABLET | Freq: Every day | ORAL | Status: DC
Start: 2017-06-02 — End: 2017-06-04
  Administered 2017-06-02 – 2017-06-04 (×3): 1 via ORAL
  Filled 2017-06-01 (×3): qty 1

## 2017-06-01 MED ORDER — VANCOMYCIN HCL IN DEXTROSE 1-5 GM/200ML-% IV SOLN
1000.0000 mg | Freq: Once | INTRAVENOUS | Status: AC
  Administered 2017-06-01: 1000 mg via INTRAVENOUS
  Filled 2017-06-01: qty 200

## 2017-06-01 NOTE — Progress Notes (Signed)
Dr. Laural BenesJohnson paged x2 re: patient's blood pressure  - running 70-80s SBP over 50-80's. Most recent 68/54. New orders received. Patient asleep, VS otherwise stable at this time. Son at bedside.

## 2017-06-01 NOTE — Progress Notes (Addendum)
Pharmacy Antibiotic Note  Jon [name] is a 83 y.o. male admitted on 05/27/2017 with sepsis.  Pharmacy has been consulted for Vancomycin and cefepime dosing. pt becoming more hypotensive throughout day.Broaden antibiotic coverage with cefepime and vancomycin.   Ordered levophed in case it is needed.  Pt does have a central line in place.   Plan: Vancomycin 1000mg  loading dose, then 750mg   IV every 12 hours.  Goal trough 15-20 mcg/mL.  Cefepime 2gm loading dose, the 1gm IV q8h F/U clinical progress and plan Monitor V/S, labs and levels as indicated  Height: 5\' 6"  (167.6 cm) Weight: 134 lb 0.6 oz (60.8 kg) IBW/kg (Calculated) : 63.8  Temp (24hrs), Avg:94.6 F (34.8 C), Min:92.4 F (33.6 C), Max:97.2 F (36.2 C)  Recent Labs  Lab 05/27/17 1547 05/28/17 0523 05/29/17 0456 05/31/17 0438 06/01/17 0545  WBC 31.2* 23.2* 13.1* 10.0 8.2  CREATININE 1.09 0.83 0.53* 0.53* 0.50*    Estimated Creatinine Clearance: 60.2 mL/min (A) (by C-G formula based on SCr of 0.5 mg/dL (L)).    Allergies  Allergen Reactions  . Iohexol      Desc: PT STATES THROAT/FACE SWELLING W/ IVP DYE IN 1990. PT HAS NOT HAD ANY EXAMS DONE W/DYE SINCE AND HAS NEVER BEEN PRE MEDICATED. PER PT, Onset Date: [id number]   . Shellfish Allergy   . Dilantin [Phenytoin Sodium Extended] Rash  . Tegretol [Carbamazepine] Rash    Antimicrobials this admission: Vancomycin 12/12>>12/15 then 12/16 >>  Cefepime 2/16>>  Zosyn  12/12>> 12/15  Doxycycline 12/15>> 12/16  Dose adjustments this admission: N/A  Microbiology results: 12/11 BCx: ngtd 12/11 UCx: Multiple species. NTR 12/11 MRSA PCR: +  Thank you for allowing pharmacy to be a part of this patient's care.  Elder CyphersLorie Yadiel Aubry, BS Loura Backharm D, [place] Clinical Pharmacist Pager [phone] 06/01/2017 4:40 PM

## 2017-06-01 NOTE — Progress Notes (Addendum)
PROGRESS NOTE    Jon [name]  WUJ:[id number] DOB: 16-Nov-1931 DOA: 05/27/2017 PCP: Gareth Morgan, MD   Brief Narrative:  Patient is an 83 year old man admitted from home on 12/11 due to weakness.  In the ED patient was noticed to be hypotensive, UA was negative, sodium was 114, patient was admitted with sepsis and hypotension.  Assessment & Plan:   Principal Problem:   Weak Active Problems:   Severe dehydration   Sepsis (HCC)   Hyponatremia   Hypotension   Sacral ulcer (HCC)   PAF (paroxysmal atrial fibrillation) (HCC)   Lethargy   Encounter for hospice care discussion   Hypothermia  Hypotension -I think this is mainly due to dehydration/failure to thrive and sepsis. -Blood pressure slowly improving.  Has not required pressors. -Patient's prognosis remains very poor, palliative care is involved.  A case with APS has been filed and will be presented before the judge on Monday for interim guardianship.  For now patient remains full code. - WBC count has trended down to normal.  De-escalated antibiotics 12/15.   ** addendum: pt becoming more hypotensive throughout day, will bolus IVFs, pt already has central line in case we need to start pressors, broaden antibiotic coverage with cefepime and vancomycin.   Ordered levophed in case it is needed.  Pt does have a central line in place.    Acute metabolic encephalopathy -Improving slowly.  Due to hypotension, dehydration as well as possible sepsis. -Appears to be back to baseline, able to answer questions appropriately although still has poor insight into the severity of his illness.  Stage IV sacral decubitus ulcers -Multiple including sacrum and mid to lower back. -wound care consultation appreciated - see orders.  Severe protein caloric malnutrition -Nutrition consultation appreciated.  Hyponatremia -This is hypovolemic in nature. -Improving with IV fluids, continue.  Hypokalemia -Replace orally.     Hypomagnesemia -Repleted.   Hypothermia - slowly improving. Multifactorial causes  Paroxysmal atrial fibrillation -Rate controlled at present.  History of hypertension -Home [facility] on hold.    DVT prophylaxis: Lovenox Code Status: Full code for now Family Communication: Discussed with son Tawanna Cooler at bedside  Disposition Plan: Patient cannot make informed decisions.  He does not understand his prognosis and consequences of his medical illnesses.  His prognosis is very poor, anticipate weeks at most.  Strongly recommend hospice care in this patient and DNR.  Son seems tangential, jumps from subject to subject and is unable to focus on conversation.  Continues to perseverate on the fact that he wants to take his father home despite enough evidence to suggest that he is currently overwhelmed with his father's care.  We will continue to discuss these issues with him on a daily basis.  This patient remains critically ill.  Consultants:   Palliative care  Procedures:   None  Antimicrobials:  Anti-infectives (From admission, onward)   Start     Dose/Rate Route Frequency Ordered Stop   05/31/17 1000  doxycycline (VIBRA-TABS) tablet 100 mg     100 mg Oral Every 12 hours 05/31/17 0710     05/28/17 1100  piperacillin-tazobactam (ZOSYN) IVPB 3.375 g  Status:  Discontinued     3.375 g 12.5 mL/hr over 240 Minutes Intravenous Every 8 hours 05/28/17 0743 05/31/17 0710   05/28/17 1000  vancomycin (VANCOCIN) 500 mg in sodium chloride 0.9 % 100 mL IVPB  Status:  Discontinued     500 mg 100 mL/hr over 60 Minutes Intravenous Every 12 hours 05/28/17 0743 05/31/17 0710  05/28/17 0030  piperacillin-tazobactam (ZOSYN) IVPB 3.375 g     3.375 g 12.5 mL/hr over 240 Minutes Intravenous  Once 05/28/17 0019 05/28/17 0642   05/27/17 1700  vancomycin (VANCOCIN) IVPB 1000 mg/200 mL premix     1,000 mg 200 mL/hr over 60 Minutes Intravenous  Once 05/27/17 1652 05/27/17 1916   05/27/17 1700   piperacillin-tazobactam (ZOSYN) IVPB 3.375 g     3.375 g 100 mL/hr over 30 Minutes Intravenous  Once 05/27/17 1652 05/27/17 1744     Subjective: Pt says that he is feeling a lot better today.    Objective: Vitals:   05/31/17 2300 06/01/17 0000 06/01/17 0100 06/01/17 0200  BP: 105/85 107/82  106/61  Pulse:      Resp: 10 11 12 15   Temp:  (!) 94.5 F (34.7 C)    TempSrc:  Rectal    SpO2:      Weight:      Height:        Intake/Output Summary (Last 24 hours) at 06/01/2017 0650 Last data filed at 06/01/2017 0000 Gross per 24 hour  Intake 1090 ml  Output 550 ml  Net 540 ml   Filed Weights   05/28/17 0500 05/29/17 0500 05/30/17 0400  Weight: 58.7 kg (129 lb 6.6 oz) 61 kg (134 lb 7.7 oz) 60.8 kg (134 lb 0.6 oz)   Examination:  General exam: Awake, alert, NAD. Cooperative.  Respiratory system: Clear to auscultation. Respiratory effort normal. Cardiovascular system. Normal s1, s2 sounds.  No murmurs, rubs, gallops. Gastrointestinal system: Abdomen is nondistended, soft and nontender. No organomegaly or masses felt. Normal bowel sounds heard.  Ostomy full of soft brown stool.  Central nervous system: No focal findings.  Extremities: No C/C/E, +pedal pulses Skin: Multiple stage IV decubitus ulcers of sacrum and mid to lower back and shoulder. Psychiatry: flat affect.   Data Reviewed: I have personally reviewed following labs and imaging studies  CBC: Recent Labs  Lab 05/27/17 1547 05/28/17 0523 05/29/17 0456 05/31/17 0438 06/01/17 0545  WBC 31.2* 23.2* 13.1* 10.0 8.2  NEUTROABS 29.9*  --   --   --   --   HGB 10.4* 9.1* 8.4* 9.0* 9.6*  HCT 31.1* 27.1* 26.1* 27.9* 29.2*  MCV 80.4 80.9 82.9 83.0 83.0  PLT 216 177 129* 115* PENDING   Basic Metabolic Panel: Recent Labs  Lab 05/27/17 1547 05/28/17 0523 05/29/17 0456 05/29/17 1701 05/31/17 0438 06/01/17 0545  NA 114* 121* 124*  --  125* 129*  K 5.4* 4.2 3.0*  --  4.7 4.5  CL 82* 90* 97*  --  98* 102  CO2 19* 21*  20*  --  21* 21*  GLUCOSE 90 104* 117*  --  103* 96  BUN 39* 35* 29*  --  32* 30*  CREATININE 1.09 0.83 0.53*  --  0.53* 0.50*  CALCIUM 8.4* 8.2* 7.8*  --  8.0* 8.0*  MG  --   --   --  1.5* 2.0 1.9   GFR: Estimated Creatinine Clearance: 60.2 mL/min (A) (by C-G formula based on SCr of 0.5 mg/dL (L)). Liver Function Tests: Recent Labs  Lab 05/27/17 1547 05/28/17 0523 05/29/17 0456  AST 19 16 20   ALT 14* 13* 12*  ALKPHOS 106 83 74  BILITOT 1.1 0.7 0.6  PROT 5.9* 4.9* 4.3*  ALBUMIN 2.6* 2.1* 1.9*   No results for input(s): LIPASE, AMYLASE in the last 168 hours. No results for input(s): AMMONIA in the last 168 hours. Coagulation Profile: No  results for input(s): INR, PROTIME in the last 168 hours. Cardiac Enzymes: Recent Labs  Lab 05/27/17 1547 05/27/17 2349 05/28/17 0523 05/28/17 1120  TROPONINI <0.03 <0.03 <0.03 <0.03   BNP (last 3 results) No results for input(s): PROBNP in the last 8760 hours. HbA1C: No results for input(s): HGBA1C in the last 72 hours. CBG: No results for input(s): GLUCAP in the last 168 hours. Lipid Profile: No results for input(s): CHOL, HDL, LDLCALC, TRIG, CHOLHDL, LDLDIRECT in the last 72 hours. Thyroid Function Tests: No results for input(s): TSH, T4TOTAL, FREET4, T3FREE, THYROIDAB in the last 72 hours. Anemia Panel: No results for input(s): VITAMINB12, FOLATE, FERRITIN, TIBC, IRON, RETICCTPCT in the last 72 hours. Urine analysis:    Component Value Date/Time   COLORURINE YELLOW 05/27/2017 1543   APPEARANCEUR TURBID (A) 05/27/2017 1543   LABSPEC 1.023 05/27/2017 1543   PHURINE 8.0 05/27/2017 1543   GLUCOSEU NEGATIVE 05/27/2017 1543   HGBUR NEGATIVE 05/27/2017 1543   BILIRUBINUR SMALL (A) 05/27/2017 1543   KETONESUR NEGATIVE 05/27/2017 1543   PROTEINUR >=300 (A) 05/27/2017 1543   UROBILINOGEN 0.2 03/14/2015 1515   NITRITE NEGATIVE 05/27/2017 1543   LEUKOCYTESUR MODERATE (A) 05/27/2017 1543   Recent Results (from the past 240 hour(s))   Culture, Urine     Status: Abnormal   Collection Time: 05/27/17  5:29 PM  Result Value Ref Range Status   Specimen Description URINE, CATHETERIZED  Final   Special Requests NONE  Final   Culture MULTIPLE SPECIES PRESENT, SUGGEST RECOLLECTION (A)  Final   Report Status 05/30/2017 FINAL  Final  Blood culture (routine x 2)     Status: None (Preliminary result)   Collection Time: 05/27/17  8:02 PM  Result Value Ref Range Status   Specimen Description BLOOD RIGHT ARM  Final   Special Requests   Final    BOTTLES DRAWN AEROBIC AND ANAEROBIC Blood Culture results may not be optimal due to an inadequate volume of blood received in culture bottles   Culture NO GROWTH 4 DAYS  Final   Report Status PENDING  Incomplete  Blood culture (routine x 2)     Status: None (Preliminary result)   Collection Time: 05/27/17  8:06 PM  Result Value Ref Range Status   Specimen Description BLOOD RIGHT HAND  Final   Special Requests   Final    BOTTLES DRAWN AEROBIC AND ANAEROBIC Blood Culture results may not be optimal due to an inadequate volume of blood received in culture bottles   Culture NO GROWTH 4 DAYS  Final   Report Status PENDING  Incomplete  MRSA PCR Screening     Status: Abnormal   Collection Time: 05/27/17 10:51 PM  Result Value Ref Range Status   MRSA by PCR POSITIVE (A) NEGATIVE Final    Comment:        The GeneXpert MRSA Assay (FDA approved for NASAL specimens only), is one component of a comprehensive MRSA colonization surveillance program. It is not intended to diagnose MRSA infection nor to guide or monitor treatment for MRSA infections. RESULT CALLED TO, READ BACK BY AND VERIFIED WITH: ALLEN N. AT 0900 ON [id number] BY THOMPSON S.     Radiology Studies: No results found.  Scheduled Meds: . carvedilol  3.125 mg Oral BID WC  . Chlorhexidine Gluconate Cloth  6 each Topical Q0600  . doxycycline  100 mg Oral Q12H  . enoxaparin (LOVENOX) injection  40 mg Subcutaneous Q24H  . feeding  supplement (PRO-STAT SUGAR FREE 64)  30 mL  Oral BID  . multivitamin  15 mL Oral Daily  . mupirocin ointment  1 application Nasal BID  . nutrition supplement (JUVEN)  1 packet Oral BID BM  . nystatin   Topical Daily   Continuous Infusions: . sodium chloride 50 mL/hr at 06/01/17 0233     LOS: 5 days    Critical Care Time spent: 31 minutes. Greater than 50% of this time was spent in direct contact with the patient coordinating care.  Standley Dakins, MD [facility] Pager [phone]  If 7PM-7AM, please contact night-coverage [email or web address] Password [email or web address] 06/01/2017, 6:50 AM

## 2017-06-02 LAB — MAGNESIUM: Magnesium: 1.6 mg/dL — ABNORMAL LOW (ref 1.7–2.4)

## 2017-06-02 LAB — BASIC METABOLIC PANEL
ANION GAP: 6 (ref 5–15)
BUN: 28 mg/dL — ABNORMAL HIGH (ref 6–20)
CHLORIDE: 101 mmol/L (ref 101–111)
CO2: 19 mmol/L — ABNORMAL LOW (ref 22–32)
Calcium: 7.8 mg/dL — ABNORMAL LOW (ref 8.9–10.3)
Creatinine, Ser: 0.38 mg/dL — ABNORMAL LOW (ref 0.61–1.24)
Glucose, Bld: 112 mg/dL — ABNORMAL HIGH (ref 65–99)
POTASSIUM: 3.7 mmol/L (ref 3.5–5.1)
SODIUM: 126 mmol/L — AB (ref 135–145)

## 2017-06-02 LAB — CBC
HEMATOCRIT: 27.1 % — AB (ref 39.0–52.0)
Hemoglobin: 8.9 g/dL — ABNORMAL LOW (ref 13.0–17.0)
MCH: 27.5 pg (ref 26.0–34.0)
MCHC: 32.8 g/dL (ref 30.0–36.0)
MCV: 83.6 fL (ref 78.0–100.0)
Platelets: 137 10*3/uL — ABNORMAL LOW (ref 150–400)
RBC: 3.24 MIL/uL — AB (ref 4.22–5.81)
RDW: 17.7 % — ABNORMAL HIGH (ref 11.5–15.5)
WBC: 12.9 10*3/uL — AB (ref 4.0–10.5)

## 2017-06-02 MED ORDER — NOREPINEPHRINE 4 MG/250ML-% IV SOLN
INTRAVENOUS | Status: AC
  Filled 2017-06-02: qty 250

## 2017-06-02 MED ORDER — MAGNESIUM SULFATE 2 GM/50ML IV SOLN
2.0000 g | Freq: Once | INTRAVENOUS | Status: AC
  Administered 2017-06-02: 2 g via INTRAVENOUS
  Filled 2017-06-02: qty 50

## 2017-06-02 MED ORDER — VANCOMYCIN HCL IN DEXTROSE 750-5 MG/150ML-% IV SOLN
INTRAVENOUS | Status: AC
  Filled 2017-06-02: qty 150

## 2017-06-02 NOTE — Progress Notes (Signed)
Daily Progress Note   Patient Name: Jon [name]       Date: 06/02/2017 DOB: 1932/04/14  Age: 83 y.o. MRN#: [id number] Attending Physician: Cleora Fleet, MD Primary Care Physician: Gareth Morgan, MD Admit Date: 05/27/2017  Reason for Consultation/Follow-up: Establishing goals of care and Psychosocial/spiritual support  Subjective: Jon [name] is lying quietly in bed.  He will not open his eyes when I asked him to.  He appears to be very ill and frail.  There is no family at bedside at this time.  We talked about what is next.  I ask if he would be willing to go to a long-term acute care in an attempt for improvement.  He tells me that he would "have to think about it".  Conference with DSS SW rt interim guardianship. SW states that SW is constrained for "END OF LIFE" choices when they have interim guardianship.    Conference with CM, SW related to disposition plan, which is complicated by severity of wounds.   Length of Stay: 6  Current Medications: Scheduled Meds:  . Chlorhexidine Gluconate Cloth  6 each Topical Q0600  . enoxaparin (LOVENOX) injection  40 mg Subcutaneous Q24H  . feeding supplement (PRO-STAT SUGAR FREE 64)  30 mL Oral BID  . multivitamin with minerals  1 tablet Oral Daily  . mupirocin ointment  1 application Nasal BID  . nutrition supplement (JUVEN)  1 packet Oral BID BM  . nystatin   Topical Daily    Continuous Infusions: . ceFEPime (MAXIPIME) IV Stopped (06/02/17 0215)  . magnesium sulfate 1 - 4 g bolus IVPB    . norepinephrine (LEVOPHED) Adult infusion 6 mcg/min (06/02/17 0651)  . vancomycin Stopped (06/02/17 0644)    PRN Meds: acetaminophen **OR** acetaminophen, ALPRAZolam, tetrahydrozoline  Physical Exam  Constitutional: No distress.    Lying quietly in bed, will not open his eyes, minimal communication  HENT:  Head: Atraumatic.  Temporal wasting  Cardiovascular: Normal rate and regular rhythm.  Pulmonary/Chest: Effort normal. No respiratory distress.  Abdominal: Soft. He exhibits no distension.  Musculoskeletal: He exhibits no edema.  Severe muscle wasting of the legs, obviously bedbound for extended amount of time  Neurological:  Known dementia  Skin: Skin is warm and dry.  Multiple wounds as noted by wound care nurse  Psychiatric:  Poor  insight to his current health situation, does not understand consequences of his decisions  Nursing note and vitals reviewed.           Vital Signs: BP 103/72   Pulse 83   Temp 98.3 F (36.8 C) (Rectal)   Resp 16   Ht 5\' 6"  (1.676 m)   Wt 68.2 kg (150 lb 5.7 oz)   SpO2 94%   BMI 24.27 kg/m  SpO2: SpO2: 94 % O2 Device: O2 Device: Nasal Cannula O2 Flow Rate: O2 Flow Rate (L/min): 2 L/min  Intake/output summary:   Intake/Output Summary (Last 24 hours) at 06/02/2017 1111 Last data filed at 06/02/2017 [id number] Gross per 24 hour  Intake 2459.94 ml  Output 650 ml  Net 1809.94 ml   LBM: Last BM Date: 06/01/17 Baseline Weight: Weight: 61.2 kg (135 lb) Most recent weight: Weight: 68.2 kg (150 lb 5.7 oz)       Palliative Assessment/Data:    Flowsheet Rows     Most Recent Value  Intake Tab  Referral Department  Hospitalist  Unit at Time of Referral  ICU  Palliative Care Primary Diagnosis  Sepsis/Infectious Disease  Date Notified  05/28/17  Palliative Care Type  Return patient Palliative Care  Reason for referral  Clarify Goals of Care  Date of Admission  05/27/17  Date first seen by Palliative Care  05/28/17  # of days Palliative referral response time  0 Day(s)  # of days IP prior to Palliative referral  1  Clinical Assessment  Palliative Performance Scale Score  20%  Pain Max last 24 hours  Not able to report  Pain Min Last 24 hours  Not able to report   Dyspnea Max Last 24 Hours  Not able to report  Dyspnea Min Last 24 hours  Not able to report  Psychosocial & Spiritual Assessment  Palliative Care Outcomes      Patient Active Problem List   Diagnosis Date Noted  . Hypothermia 05/31/2017  . Encounter for hospice care discussion   . Weak 05/27/2017  . Lethargy 05/27/2017  . Leukocytosis   . N&V (nausea and vomiting)   . Goals of care, counseling/discussion   . DNR (do not resuscitate) discussion   . Palliative care encounter   . Pressure injury of skin 08/19/2016  . Septic shock (HCC) 08/19/2016  . Gastric distention   . Heme positive stool   . UGI bleed 08/18/2016  . Sacral decubitus ulcer, stage IV (HCC) Dec 22, 202017  . Epistaxis 05/10/2016  . Diarrhea 05/02/2016  . Prolonged QT interval   . PAF (paroxysmal atrial fibrillation) (HCC)   . Tachycardia-bradycardia syndrome (HCC)   . Cardiac pacemaker in situ   . Hematemesis 05/15/2015  . Gastrointestinal hemorrhage 05/15/2015  . Malnutrition of moderate degree (HCC) 07/11/2014  . Perianal infection 07/06/2014  . Decubitus ulcer 07/06/2014  . Sacral ulcer (HCC) 07/06/2014  . Anemia of chronic disease 06/16/2014  . Sepsis (HCC) 06/15/2014  . Lower urinary tract infectious disease 06/15/2014  . Acute kidney injury (HCC) 06/15/2014  . Bilateral hydronephrosis 06/15/2014  . Bladder calculi 06/15/2014  . Rhabdomyolysis 06/15/2014  . Hyponatremia 06/15/2014  . Multiple rib fractures 06/15/2014  . Hypotension 06/15/2014  . S/P placement of cardiac pacemaker 06/15/2014  . Dementia 06/15/2014  . AKI (acute kidney injury) (HCC) 06/15/2014  . Knee pain   . Severe dehydration 06/09/2014  . Hypertension 06/09/2014  . Abnormality of gait 05/03/2013  . Essential and other specified forms of tremor 05/03/2013  .  Polyneuropathy in other diseases classified elsewhere (HCC) 05/03/2013  . Atrial fibrillation (HCC) 09/07/2012  . Long term current use of anticoagulant therapy  09/07/2012    Palliative Care Assessment & Plan   Patient Profile: 83 y.o.malewith past medical history of dementia, bedbound status approximately 1 year, essential tremor, dysrhythmia, falls with multiple rib fractures, obstructive sleep apnea, does not use CPAP, history of paroxysmal atrial fibrillation with permanent cardiac pacemaker, tachybradycardia syndromeadmitted on 12/11/2018with sepsis, hyponatremia with sodium 114,multiple bedsores.   Assessment:  sepsis, possible source multiple bedsores: White blood cells reduced from 31-21 today, core body temperature reduced to 93.  Jon [name] seems to be actively dying.  His wounds are, in my opinion, insurmountable.  12/17, some improvement over the weekend, but declined on 12/16 with low blood pressure requiring vasopressors.  hyponatremia with sodium 114: Improving with hydration.  Likely to return without the benefits of life sustaining IV fluids.   Recommendations/Plan:  At this point, son Tawanna Coolerodd is seeking full scope treatment.  Adult Protective Services involved.  Contact person Kym GroomCarrie Friese, DSS SW [phone] fax [phone].   Temporary guardianship to be determined today.  Conference with DSS social worker prior to court time.  Goals of Care and Additional Recommendations:  Limitations on Scope of Treatment: Full Scope Treatment  Code Status:    Code Status Orders  (From admission, onward)        Start     Ordered   05/27/17 2337  Full code  Continuous     05/27/17 2336    Code Status History    Date Active Date Inactive Code Status Order ID Comments User Context   08/22/2016 11:21 08/25/2016 17:32 DNR [id number]  Katheran Aweove, Shi Grose A, NP Inpatient   08/18/2016 20:56 08/22/2016 11:21 Full Code [id number]  Meredeth IdeLama, Gagan S, MD Inpatient   05/10/2016 15:56 05/13/2016 18:28 Full Code [id number]  Henderson CloudHernandez Acosta, Estela Y, MD ED   05/02/2016 12:48 05/06/2016 17:29 DNR [id number]  Leroy SeaSingh, Prashant K, MD Inpatient    05/15/2015 16:03 05/18/2015 18:01 Full Code [id number]  Standley BrookingGoodrich, Daniel P, MD ED   07/11/2014 11:59 07/12/2014 19:32 Full Code [id number]  Dalia HeadingJenkins, Mark A, MD Inpatient   07/06/2014 21:30 07/11/2014 11:59 Full Code [id number]  Doree AlbeeNewton, Steven, MD ED   06/15/2014 10:41 06/18/2014 18:56 Full Code [id number]  Standley BrookingGoodrich, Daniel P, MD Inpatient   06/09/2014 16:50 06/11/2014 21:19 Full Code [id number]  Wilson SingerGosrani, Nimish C, MD ED       Prognosis:   < 4 weeks  or less would not be surprising based on functional status (bedbound for 1 year), severity of bed sores/wounds, frailty, functional status, current infection, dementia, sodium of 114 on admit, white blood cells of 31 on admit.  Discharge Planning:  To be determined, possible SNF for wound care?  Possible LTAC?  Care plan was discussed with nursing staff, case management, [facility] social worker, DSS social worker, and Dr. Laural BenesJohnson.  Thank you for allowing the Palliative Medicine Team to assist in the care of this patient.   Time In: 1040 Time Out: 1105 Total Time  25 minutes Prolonged Time Billed  no       Greater than 50%  of this time was spent counseling and coordinating care related to the above assessment and plan.  Katheran Aweasha A Hill Mackie, NP  Please contact Palliative Medicine Team phone at [phone] for questions and concerns.

## 2017-06-02 NOTE — Progress Notes (Signed)
PROGRESS NOTE   Jon CutterWilliam P [name]  NWG:[id number] DOB: 07-27-1931 DOA: 05/27/2017 PCP: Jon MorganKnowlton, Steve, MD   Brief Narrative:  Patient is an 83 year old man admitted from home on 12/11 due to weakness.  In the ED patient was noticed to be hypotensive, UA was negative, sodium was 114, patient was admitted with sepsis and hypotension.  Assessment & Plan:   Principal Problem:   Weak Active Problems:   Severe dehydration   Sepsis (HCC)   Hyponatremia   Hypotension   Sacral ulcer (HCC)   PAF (paroxysmal atrial fibrillation) (HCC)   Lethargy   Encounter for hospice care discussion   Hypothermia  Hypotension -secondary to sepsis from chronic wounds. -Pt had to be started on pressors (levophed) 12/16 to maintain MAP>65. -Patient's prognosis remains very poor, palliative care is involved.  A case with APS has been filed and will be presented before the judge on Monday for interim guardianship.  For now patient remains full code.  Sepsis - secondary to unknown organism - I had to broaden antibiotic coverage 12/16 as patient's clinical status began to deteriorate.  He is doing a little better today.     Acute metabolic encephalopathy -Improving slowly.  Due to hypotension and sepsis. -Appears to be back to baseline, able to answer questions appropriately although still has poor insight into the severity of his illness.  Stage IV sacral decubitus ulcers -Multiple including sacrum and mid to lower back. -wound care consultation appreciated - see orders.  Severe protein caloric malnutrition -Nutrition consultation appreciated.  Hyponatremia -This is hypovolemic in nature. -Improving with IV fluids, continue.  Hypokalemia -Replace orally.    Hypomagnesemia -IV replacement ordered.   Hypothermia - slowly improving with warming blanket.  Multifactorial causes for this given severe sepsis and malnutrition.   Paroxysmal atrial fibrillation -Rate controlled at present.  History of  hypertension -Home BETAPACE on hold.   DVT prophylaxis: Lovenox Code Status: Full code for now Family Communication: Discussed with son Jon [name] at bedside  Disposition Plan: Patient cannot make informed decisions.  He does not understand his prognosis and consequences of his medical illnesses.  His prognosis is very poor, anticipate weeks at most.  Strongly recommend hospice care in this patient and DNR.  Son seems tangential, jumps from subject to subject and is unable to focus on conversation.  Continues to perseverate on the fact that he wants to take his father home despite enough evidence to suggest that he is currently overwhelmed with his father's care.  We will continue to discuss these issues with him on a daily basis.  This patient remains critically ill.  Consultants:   Palliative care  Procedures:   None  Antimicrobials:  Anti-infectives (From admission, onward)   Start     Dose/Rate Route Frequency Ordered Stop   06/02/17 0600  vancomycin (VANCOCIN) IVPB 750 mg/150 ml premix     750 mg 150 mL/hr over 60 Minutes Intravenous Every 12 hours 06/01/17 1639     06/02/17 0100  ceFEPIme (MAXIPIME) 1 g in dextrose 5 % 50 mL IVPB     1 g 100 mL/hr over 30 Minutes Intravenous Every 8 hours 06/01/17 1639     06/01/17 1800  vancomycin (VANCOCIN) IVPB 1000 mg/200 mL premix     1,000 mg 200 mL/hr over 60 Minutes Intravenous  Once 06/01/17 1639 06/01/17 1920   06/01/17 1700  ceFEPIme (MAXIPIME) 2 g in dextrose 5 % 50 mL IVPB     2 g 100 mL/hr over 30 Minutes  Intravenous  Once 06/01/17 1639 06/01/17 1807   05/31/17 1000  doxycycline (VIBRA-TABS) tablet 100 mg  Status:  Discontinued     100 mg Oral Every 12 hours 05/31/17 0710 06/01/17 1611   05/28/17 1100  piperacillin-tazobactam (ZOSYN) IVPB 3.375 g  Status:  Discontinued     3.375 g 12.5 mL/hr over 240 Minutes Intravenous Every 8 hours 05/28/17 0743 05/31/17 0710   05/28/17 1000  vancomycin (VANCOCIN) 500 mg in sodium chloride 0.9 %  100 mL IVPB  Status:  Discontinued     500 mg 100 mL/hr over 60 Minutes Intravenous Every 12 hours 05/28/17 0743 05/31/17 0710   05/28/17 0030  piperacillin-tazobactam (ZOSYN) IVPB 3.375 g     3.375 g 12.5 mL/hr over 240 Minutes Intravenous  Once 05/28/17 0019 05/28/17 0642   05/27/17 1700  vancomycin (VANCOCIN) IVPB 1000 mg/200 mL premix     1,000 mg 200 mL/hr over 60 Minutes Intravenous  Once 05/27/17 1652 05/27/17 1916   05/27/17 1700  piperacillin-tazobactam (ZOSYN) IVPB 3.375 g     3.375 g 100 mL/hr over 30 Minutes Intravenous  Once 05/27/17 1652 05/27/17 1744     Subjective: Pt is awake, alert, denies complaints.     Objective: Vitals:   06/02/17 0600 06/02/17 0615 06/02/17 0630 06/02/17 0645  BP: 107/73 106/66 105/70 103/72  Pulse: 79 82 75 83  Resp: 12 15 12 16   Temp: 98.3 F (36.8 C)     TempSrc: Rectal     SpO2: 100% 99% 99% 94%  Weight:      Height:        Intake/Output Summary (Last 24 hours) at 06/02/2017 0715 Last data filed at 06/02/2017 0651 Gross per 24 hour  Intake 2559.94 ml  Output 1150 ml  Net 1409.94 ml   Filed Weights   05/29/17 0500 05/30/17 0400 06/02/17 0500  Weight: 61 kg (134 lb 7.7 oz) 60.8 kg (134 lb 0.6 oz) 68.2 kg (150 lb 5.7 oz)   Examination:  General exam: Awake, alert, NAD. Cooperative.  Respiratory system: Clear to auscultation. Respiratory effort normal. Cardiovascular system. Normal s1, s2 sounds.  No murmurs, rubs, gallops. Gastrointestinal system: Abdomen is nondistended, soft and nontender. No organomegaly or masses felt. Normal bowel sounds heard.  Ostomy full of soft brown stool.  Central nervous system: No focal findings.  Extremities: No C/C/E, +pedal pulses.  Skin: Multiple stage IV decubitus ulcers of sacrum and mid to lower back and shoulder. Psychiatry: flat affect.   Data Reviewed: I have personally reviewed following labs and imaging studies  CBC: Recent Labs  Lab 05/27/17 1547 05/28/17 0523 05/29/17 0456  05/31/17 0438 06/01/17 0545 06/02/17 0414  WBC 31.2* 23.2* 13.1* 10.0 8.2 12.9*  NEUTROABS 29.9*  --   --   --   --   --   HGB 10.4* 9.1* 8.4* 9.0* 9.6* 8.9*  HCT 31.1* 27.1* 26.1* 27.9* 29.2* 27.1*  MCV 80.4 80.9 82.9 83.0 83.0 83.6  PLT 216 177 129* 115* 82* 137*   Basic Metabolic Panel: Recent Labs  Lab 05/28/17 0523 05/29/17 0456 05/29/17 1701 05/31/17 0438 06/01/17 0545 06/02/17 0414  NA 121* 124*  --  125* 129* 126*  K 4.2 3.0*  --  4.7 4.5 3.7  CL 90* 97*  --  98* 102 101  CO2 21* 20*  --  21* 21* 19*  GLUCOSE 104* 117*  --  103* 96 112*  BUN 35* 29*  --  32* 30* 28*  CREATININE 0.83 0.53*  --  0.53* 0.50* 0.38*  CALCIUM 8.2* 7.8*  --  8.0* 8.0* 7.8*  MG  --   --  1.5* 2.0 1.9 1.6*   GFR: Estimated Creatinine Clearance: 63.1 mL/min (A) (by C-G formula based on SCr of 0.38 mg/dL (L)). Liver Function Tests: Recent Labs  Lab 05/27/17 1547 05/28/17 0523 05/29/17 0456  AST 19 16 20   ALT 14* 13* 12*  ALKPHOS 106 83 74  BILITOT 1.1 0.7 0.6  PROT 5.9* 4.9* 4.3*  ALBUMIN 2.6* 2.1* 1.9*   No results for input(s): LIPASE, AMYLASE in the last 168 hours. No results for input(s): AMMONIA in the last 168 hours. Coagulation Profile: No results for input(s): INR, PROTIME in the last 168 hours. Cardiac Enzymes: Recent Labs  Lab 05/27/17 1547 05/27/17 2349 05/28/17 0523 05/28/17 1120  TROPONINI <0.03 <0.03 <0.03 <0.03   BNP (last 3 results) No results for input(s): PROBNP in the last 8760 hours. HbA1C: No results for input(s): HGBA1C in the last 72 hours. CBG: No results for input(s): GLUCAP in the last 168 hours. Lipid Profile: No results for input(s): CHOL, HDL, LDLCALC, TRIG, CHOLHDL, LDLDIRECT in the last 72 hours. Thyroid Function Tests: No results for input(s): TSH, T4TOTAL, FREET4, T3FREE, THYROIDAB in the last 72 hours. Anemia Panel: No results for input(s): VITAMINB12, FOLATE, FERRITIN, TIBC, IRON, RETICCTPCT in the last 72 hours. Urine analysis:      Component Value Date/Time   COLORURINE YELLOW 05/27/2017 1543   APPEARANCEUR TURBID (A) 05/27/2017 1543   LABSPEC 1.023 05/27/2017 1543   PHURINE 8.0 05/27/2017 1543   GLUCOSEU NEGATIVE 05/27/2017 1543   HGBUR NEGATIVE 05/27/2017 1543   BILIRUBINUR SMALL (A) 05/27/2017 1543   KETONESUR NEGATIVE 05/27/2017 1543   PROTEINUR >=300 (A) 05/27/2017 1543   UROBILINOGEN 0.2 03/14/2015 1515   NITRITE NEGATIVE 05/27/2017 1543   LEUKOCYTESUR MODERATE (A) 05/27/2017 1543   Recent Results (from the past 240 hour(s))  Culture, Urine     Status: Abnormal   Collection Time: 05/27/17  5:29 PM  Result Value Ref Range Status   Specimen Description URINE, CATHETERIZED  Final   Special Requests NONE  Final   Culture MULTIPLE SPECIES PRESENT, SUGGEST RECOLLECTION (A)  Final   Report Status 05/30/2017 FINAL  Final  Blood culture (routine x 2)     Status: None   Collection Time: 05/27/17  8:02 PM  Result Value Ref Range Status   Specimen Description BLOOD RIGHT ARM  Final   Special Requests   Final    BOTTLES DRAWN AEROBIC AND ANAEROBIC Blood Culture results may not be optimal due to an inadequate volume of blood received in culture bottles   Culture NO GROWTH 5 DAYS  Final   Report Status 06/01/2017 FINAL  Final  Blood culture (routine x 2)     Status: None   Collection Time: 05/27/17  8:06 PM  Result Value Ref Range Status   Specimen Description BLOOD RIGHT HAND  Final   Special Requests   Final    BOTTLES DRAWN AEROBIC AND ANAEROBIC Blood Culture results may not be optimal due to an inadequate volume of blood received in culture bottles   Culture NO GROWTH 5 DAYS  Final   Report Status 06/01/2017 FINAL  Final  MRSA PCR Screening     Status: Abnormal   Collection Time: 05/27/17 10:51 PM  Result Value Ref Range Status   MRSA by PCR POSITIVE (A) NEGATIVE Final    Comment:        The GeneXpert MRSA  Assay (FDA approved for NASAL specimens only), is one component of a comprehensive MRSA  colonization surveillance program. It is not intended to diagnose MRSA infection nor to guide or monitor treatment for MRSA infections. RESULT CALLED TO, READ BACK BY AND VERIFIED WITH: ALLEN N. AT 0900 ON [id number] BY THOMPSON S.     Radiology Studies: No results found.  Scheduled Meds: . Chlorhexidine Gluconate Cloth  6 each Topical Q0600  . enoxaparin (LOVENOX) injection  40 mg Subcutaneous Q24H  . feeding supplement (PRO-STAT SUGAR FREE 64)  30 mL Oral BID  . multivitamin with minerals  1 tablet Oral Daily  . mupirocin ointment  1 application Nasal BID  . nutrition supplement (JUVEN)  1 packet Oral BID BM  . nystatin   Topical Daily   Continuous Infusions: . sodium chloride 100 mL/hr at 06/02/17 0146  . ceFEPime (MAXIPIME) IV Stopped (06/02/17 0215)  . norepinephrine (LEVOPHED) Adult infusion 6 mcg/min (06/02/17 0651)  . vancomycin Stopped (06/02/17 0644)     LOS: 6 days    Critical Care Time spent: 33 minutes. Greater than 50% of this time was spent in direct contact with the patient coordinating care.  Standley Dakins, MD [facility] Pager [phone]  If 7PM-7AM, please contact night-coverage [email or web address] Password [email or web address] 06/02/2017, 7:15 AM

## 2017-06-03 ENCOUNTER — Inpatient Hospital Stay (HOSPITAL_COMMUNITY): Payer: Medicare Other

## 2017-06-03 DIAGNOSIS — N179 Acute kidney failure, unspecified: Secondary | ICD-10-CM

## 2017-06-03 DIAGNOSIS — Z5189 Encounter for other specified aftercare: Secondary | ICD-10-CM

## 2017-06-03 DIAGNOSIS — G9341 Metabolic encephalopathy: Secondary | ICD-10-CM

## 2017-06-03 DIAGNOSIS — E43 Unspecified severe protein-calorie malnutrition: Secondary | ICD-10-CM

## 2017-06-03 DIAGNOSIS — I4891 Unspecified atrial fibrillation: Secondary | ICD-10-CM

## 2017-06-03 LAB — URINALYSIS, COMPLETE (UACMP) WITH MICROSCOPIC
Bilirubin Urine: NEGATIVE
Glucose, UA: NEGATIVE mg/dL
KETONES UR: NEGATIVE mg/dL
Nitrite: NEGATIVE
PROTEIN: NEGATIVE mg/dL
Specific Gravity, Urine: 1.015 (ref 1.005–1.030)
Squamous Epithelial / LPF: NONE SEEN
pH: 5 (ref 5.0–8.0)

## 2017-06-03 LAB — BASIC METABOLIC PANEL
Anion gap: 9 (ref 5–15)
BUN: 22 mg/dL — ABNORMAL HIGH (ref 6–20)
CO2: 18 mmol/L — ABNORMAL LOW (ref 22–32)
Calcium: 7.8 mg/dL — ABNORMAL LOW (ref 8.9–10.3)
Chloride: 100 mmol/L — ABNORMAL LOW (ref 101–111)
Creatinine, Ser: 0.4 mg/dL — ABNORMAL LOW (ref 0.61–1.24)
GFR calc Af Amer: >60 mL/min (ref >60)
GFR calc non Af Amer: >60 mL/min (ref >60)
Glucose, Bld: 110 mg/dL — ABNORMAL HIGH (ref 65–99)
Potassium: 3.3 mmol/L — ABNORMAL LOW (ref 3.5–5.1)
Sodium: 127 mmol/L — ABNORMAL LOW (ref 135–145)

## 2017-06-03 LAB — CBC
HCT: 26.6 % — ABNORMAL LOW (ref 39.0–52.0)
Hemoglobin: 8.6 g/dL — ABNORMAL LOW (ref 13.0–17.0)
MCH: 26.9 pg (ref 26.0–34.0)
MCHC: 32.3 g/dL (ref 30.0–36.0)
MCV: 83.1 fL (ref 78.0–100.0)
Platelets: 115 10*3/uL — ABNORMAL LOW (ref 150–400)
RBC: 3.2 MIL/uL — ABNORMAL LOW (ref 4.22–5.81)
RDW: 17.7 % — ABNORMAL HIGH (ref 11.5–15.5)
WBC: 10.6 10*3/uL — ABNORMAL HIGH (ref 4.0–10.5)

## 2017-06-03 LAB — PROCALCITONIN: Procalcitonin: <0.1 ng/mL

## 2017-06-03 LAB — MAGNESIUM: MAGNESIUM: 1.8 mg/dL (ref 1.7–2.4)

## 2017-06-03 LAB — SODIUM, URINE, RANDOM: Sodium, Ur: 57 mmol/L

## 2017-06-03 LAB — LACTIC ACID, PLASMA: LACTIC ACID, VENOUS: 1 mmol/L (ref 0.5–1.9)

## 2017-06-03 LAB — OSMOLALITY: Osmolality: 269 mOsm/kg — ABNORMAL LOW (ref 275–295)

## 2017-06-03 LAB — CREATININE, URINE, RANDOM: Creatinine, Urine: 37.86 mg/dL

## 2017-06-03 LAB — VANCOMYCIN, TROUGH: Vancomycin Tr: 26 ug/mL (ref 15–20)

## 2017-06-03 MED ORDER — VANCOMYCIN HCL IN DEXTROSE 1-5 GM/200ML-% IV SOLN: 1000.0000 mg | INTRAVENOUS | Status: DC

## 2017-06-03 MED ORDER — POTASSIUM CHLORIDE CRYS ER 20 MEQ PO TBCR
40.0000 meq | EXTENDED_RELEASE_TABLET | Freq: Once | ORAL | Status: AC
  Administered 2017-06-03: 40 meq via ORAL
  Filled 2017-06-03: qty 2

## 2017-06-03 NOTE — Progress Notes (Signed)
Pharmacy Antibiotic Note  Jon CutterWilliam P [name] is a 83 y.o. male admitted on 05/27/2017 with sepsis.  Pharmacy has been consulted for Vancomycin and cefepime dosing. Vancomycin trough 26 mcg/ml.  Unfortunately dose given tonight after trough drawn Plan: Change vancomycin to 1gm IV q24 hours Cont Cefepime 1gm IV q8h F/U clinical progress and plan  Height: 5\' 6"  (167.6 cm) Weight: 149 lb 14.6 oz (68 kg) IBW/kg (Calculated) : 63.8  Temp (24hrs), Avg:98 F (36.7 C), Min:97.4 F (36.3 C), Max:98.3 F (36.8 C)  Recent Labs  Lab 05/29/17 0456 05/31/17 0438 06/01/17 0545 06/02/17 0414 06/03/17 0506 06/03/17 1507 06/03/17 1730  WBC 13.1* 10.0 8.2 12.9* 10.6*  --   --   CREATININE 0.53* 0.53* 0.50* 0.38* 0.40*  --   --   LATICACIDVEN  --   --   --   --   --  1.0  --   VANCOTROUGH  --   --   --   --   --   --  26*    Estimated Creatinine Clearance: 63.1 mL/min (A) (by C-G formula based on SCr of 0.4 mg/dL (L)).    Allergies  Allergen Reactions  . Iohexol      Desc: PT STATES THROAT/FACE SWELLING W/ IVP DYE IN 1990. PT HAS NOT HAD ANY EXAMS DONE W/DYE SINCE AND HAS NEVER BEEN PRE MEDICATED. PER PT, Onset Date: [id number]   . Shellfish Allergy   . Dilantin [Phenytoin Sodium Extended] Rash  . Tegretol [Carbamazepine] Rash    Antimicrobials this admission: Vancomycin 12/12>>12/15 then 12/16 >>  Cefepime 2/16>>  Zosyn  12/12>> 12/15  Doxycycline 12/15>> 12/16  Dose adjustments this admission: 12/18  Change vanc to 1gm q24 hours  Microbiology results: 12/11 BCx: ngtd 12/11 UCx: Multiple species. NTR 12/11 MRSA PCR: +  Thank you for allowing pharmacy to be a part of this patient's care.  Blanchie DessertLora Tristina Sahagian Pharm D Clinical Pharmacist 06/03/2017 7:53 PM

## 2017-06-03 NOTE — Progress Notes (Addendum)
PROGRESS NOTE  Jon CutterWilliam P [name] [id number] DOB: Nov 20, 1931 DOA: 05/27/2017 PCP: Gareth MorganKnowlton, Steve, MD  Brief History:  83 year old male with a history of paroxysmal defibrillation, tachybradycardia syndrome status post permanent pacemaker 2006, diastolic CHF, hypertension, hyponatremia, sacral decubitus ulcer, and dementia presenting with generalized weakness.  In the emergency department, the patient was noted to be hypotensive with WBC 31.2.  The patient was placed on vasopressors after a left femoral triple-lumen catheter was placed.  He was started on Zosyn and vancomycin.  He was subsequently weaned off of vasopressors.  His Zosyn was discontinued on May 31, 2017.  The patient subsequently became hypotensive again on June 01, 2017 requiring Levophed to be restarted.  Initial urinalysis was negative for pyuria.  Blood cultures at the time of admission have been negative.  Because of concerns of poor care at home, APS referral was made.  Palliative medicine was consulted to assist with management.  Assessment/Plan: Sepsis -Present at the time of admission -May 27, 2017 chest x-ray negative -May 27, 2017 blood cultures negative -May 27, 2017 urinalysis negative for pyuria -Patient continues to require Levophed  -check lactic acid--1.0 -check PCT -am cortisol -remains on Levophed  Acute Metabolic Encephalopathy -due to sepsis and hypotension -Appears to be back to baseline, able to answer questions appropriately although still has poor insight into the severity of his illness.  AKI -presenting creatinine 1.09 -baseline creatinine 0.3-0.6 -due to volume depletion and sepsis  Severe protein caloric malnutrition -Nutrition consultation appreciated.  Hyponatremia -due to hypovolemia and poor solute intake -Improving with IV fluids, -check urine Na and creatinine -check urine osm, serum osm  Hypokalemia -Replace orally.     Hypomagnesemia -IV replacement ordered.   Hypothermia - slowly improving with warming blanket.  Multifactorial causes for this given severe sepsis and malnutrition.  -TSH 1.711 -am cortisol  Paroxysmal atrial fibrillation -Rate controlled at present.  -Home BETAPACE on hold due to hypotension. -previously on warfarin--unclear reason for discontinueation  Thrombocytopenia -chronic -acute worsening due to sepsis -check B12 -check fibrinogen, INR  Goals of Care -appreciate palliative medicine consultation and follow up -overall prognosis is very poor due to age, co-morbidities, frailty  -despite maximal medical care in ICU setting, patient remains critically ill with high risk of mortality   Disposition Plan:   Remain in ICU Family Communication:   Son updated--The patient is critically ill with multiple organ systems failure and requires high complexity decision making for assessment and support, frequent evaluation and titration of therapies, application of advanced monitoring technologies and extensive interpretation of multiple databases.  Critical care time - 45 mins.    Consultants:  none  Code Status:  FULL   DVT Prophylaxis:  SCDs   Procedures: As Listed in Progress Note Above  Antibiotics: Vanco 12/11>>> Zosyn 12/11>>>12/15 Cefepime 12/17>>>    Subjective: The patient denies fevers, chills, headache, chest pain, shortness breath, vomiting, diarrhea.  He has very poor oral intake.  Objective: Vitals:   06/03/17 1000 06/03/17 1100 06/03/17 1105 06/03/17 1200  BP: 97/76 94/67  96/72  Pulse:      Resp: 16  14   Temp:   98.3 F (36.8 C)   TempSrc:   Axillary   SpO2:      Weight:      Height:        Intake/Output Summary (Last 24 hours) at 06/03/2017 1435 Last data filed at 06/03/2017 0500 Gross per 24 hour  Intake 233.38 ml  Output 850 ml  Net -616.62 ml   Weight change: -0.2 kg (-7.1 oz) Exam:   General:  Pt is alert, follows commands  appropriately, not in acute distress  HEENT: No icterus, No thrush, No neck mass, [place]/AT  Cardiovascular: RRR, S1/S2, no rubs, no gallops  Respiratory: Bibasilar rales.  No wheezing.  Good air movement.  Abdomen: Soft/+BS, non tender, non distended, no guarding  Extremities: No edema, No lymphangitis, No petechiae, No rashes, no synovitis   Data Reviewed: I have personally reviewed following labs and imaging studies Basic Metabolic Panel: Recent Labs  Lab 05/29/17 0456 05/29/17 1701 05/31/17 0438 06/01/17 0545 06/02/17 0414 06/03/17 0506  NA 124*  --  125* 129* 126* 127*  K 3.0*  --  4.7 4.5 3.7 3.3*  CL 97*  --  98* 102 101 100*  CO2 20*  --  21* 21* 19* 18*  GLUCOSE 117*  --  103* 96 112* 110*  BUN 29*  --  32* 30* 28* 22*  CREATININE 0.53*  --  0.53* 0.50* 0.38* 0.40*  CALCIUM 7.8*  --  8.0* 8.0* 7.8* 7.8*  MG  --  1.5* 2.0 1.9 1.6* 1.8   Liver Function Tests: Recent Labs  Lab 05/27/17 1547 05/28/17 0523 05/29/17 0456  AST 19 16 20   ALT 14* 13* 12*  ALKPHOS 106 83 74  BILITOT 1.1 0.7 0.6  PROT 5.9* 4.9* 4.3*  ALBUMIN 2.6* 2.1* 1.9*   No results for input(s): LIPASE, AMYLASE in the last 168 hours. No results for input(s): AMMONIA in the last 168 hours. Coagulation Profile: No results for input(s): INR, PROTIME in the last 168 hours. CBC: Recent Labs  Lab 05/27/17 1547  05/29/17 0456 05/31/17 0438 06/01/17 0545 06/02/17 0414 06/03/17 0506  WBC 31.2*   < > 13.1* 10.0 8.2 12.9* 10.6*  NEUTROABS 29.9*  --   --   --   --   --   --   HGB 10.4*   < > 8.4* 9.0* 9.6* 8.9* 8.6*  HCT 31.1*   < > 26.1* 27.9* 29.2* 27.1* 26.6*  MCV 80.4   < > 82.9 83.0 83.0 83.6 83.1  PLT 216   < > 129* 115* 82* 137* 115*   < > = values in this interval not displayed.   Cardiac Enzymes: Recent Labs  Lab 05/27/17 1547 05/27/17 2349 05/28/17 0523 05/28/17 1120  TROPONINI <0.03 <0.03 <0.03 <0.03   BNP: Invalid input(s): POCBNP CBG: No results for input(s): GLUCAP in  the last 168 hours. HbA1C: No results for input(s): HGBA1C in the last 72 hours. Urine analysis:    Component Value Date/Time   COLORURINE YELLOW 05/27/2017 1543   APPEARANCEUR TURBID (A) 05/27/2017 1543   LABSPEC 1.023 05/27/2017 1543   PHURINE 8.0 05/27/2017 1543   GLUCOSEU NEGATIVE 05/27/2017 1543   HGBUR NEGATIVE 05/27/2017 1543   BILIRUBINUR SMALL (A) 05/27/2017 1543   KETONESUR NEGATIVE 05/27/2017 1543   PROTEINUR >=300 (A) 05/27/2017 1543   UROBILINOGEN 0.2 03/14/2015 1515   NITRITE NEGATIVE 05/27/2017 1543   LEUKOCYTESUR MODERATE (A) 05/27/2017 1543   Sepsis Labs: @LABRCNTIP (procalcitonin:4,lacticidven:4) ) Recent Results (from the past 240 hour(s))  Culture, Urine     Status: Abnormal   Collection Time: 05/27/17  5:29 PM  Result Value Ref Range Status   Specimen Description URINE, CATHETERIZED  Final   Special Requests NONE  Final   Culture MULTIPLE SPECIES PRESENT, SUGGEST RECOLLECTION (A)  Final   Report Status 05/30/2017 FINAL  Final  Blood culture (routine x 2)     Status: None   Collection Time: 05/27/17  8:02 PM  Result Value Ref Range Status   Specimen Description BLOOD RIGHT ARM  Final   Special Requests   Final    BOTTLES DRAWN AEROBIC AND ANAEROBIC Blood Culture results may not be optimal due to an inadequate volume of blood received in culture bottles   Culture NO GROWTH 5 DAYS  Final   Report Status 06/01/2017 FINAL  Final  Blood culture (routine x 2)     Status: None   Collection Time: 05/27/17  8:06 PM  Result Value Ref Range Status   Specimen Description BLOOD RIGHT HAND  Final   Special Requests   Final    BOTTLES DRAWN AEROBIC AND ANAEROBIC Blood Culture results may not be optimal due to an inadequate volume of blood received in culture bottles   Culture NO GROWTH 5 DAYS  Final   Report Status 06/01/2017 FINAL  Final  MRSA PCR Screening     Status: Abnormal   Collection Time: 05/27/17 10:51 PM  Result Value Ref Range Status   MRSA by PCR  POSITIVE (A) NEGATIVE Final    Comment:        The GeneXpert MRSA Assay (FDA approved for NASAL specimens only), is one component of a comprehensive MRSA colonization surveillance program. It is not intended to diagnose MRSA infection nor to guide or monitor treatment for MRSA infections. RESULT CALLED TO, READ BACK BY AND VERIFIED WITH: ALLEN N. AT 0900 ON [id number] BY THOMPSON S.      Scheduled Meds: . Chlorhexidine Gluconate Cloth  6 each Topical Q0600  . enoxaparin (LOVENOX) injection  40 mg Subcutaneous Q24H  . feeding supplement (PRO-STAT SUGAR FREE 64)  30 mL Oral BID  . multivitamin with minerals  1 tablet Oral Daily  . mupirocin ointment  1 application Nasal BID  . nutrition supplement (JUVEN)  1 packet Oral BID BM  . nystatin   Topical Daily   Continuous Infusions: . ceFEPime (MAXIPIME) IV Stopped (06/03/17 1057)  . norepinephrine (LEVOPHED) Adult infusion 2 mcg/min (06/03/17 0159)  . vancomycin Stopped (06/03/17 0454)    Procedures/Studies: Dg Chest 1 View  Result Date: 05/27/2017 CLINICAL DATA:  Low blood pressure. EXAM: CHEST 1 VIEW COMPARISON:  08/19/2016 . FINDINGS: Previously identified PICC line and NG tube normal chest x-ray 08/19/2016 have been removed. Cardiac pacer with lead tip over the right atrium and right ventricle. Heart size stable. Lung volumes with mild bibasilar atelectasis. Previously identified right base infiltrate has largely cleared. No pleural effusion or pneumothorax . IMPRESSION: 1. Cardiac pacer noted with lead tip over the right atrium and right ventricle. Heart size stable. 2. Low lung volumes with mild basilar atelectasis . Previously identified infiltrate right lung base has largely cleared. Electronically Signed   By: Maisie Fus  Register   On: 05/27/2017 16:26   Ct Head Wo Contrast  Result Date: 05/27/2017 CLINICAL DATA:  Hypotension and weakness today. EXAM: CT HEAD WITHOUT CONTRAST TECHNIQUE: Contiguous axial images were obtained from  the base of the skull through the vertex without intravenous contrast. COMPARISON:  Head CT scan 06/09/2014. FINDINGS: Brain: There is cortical atrophy and chronic microvascular ischemic change. Encephalomalacia right frontal lobe subjacent to a craniotomy defect is unchanged. No evidence of acute abnormality including hemorrhage, infarct, mass lesion, mass effect, midline shift or abnormal extra-axial fluid collection. No hydrocephalus or pneumocephalus. Vascular: No hyperdense vessel or unexpected calcification. Skull: No fracture.  Craniotomy  defect noted. Sinuses/Orbits: Extensive mucosal thickening in the maxillary sinuses bilaterally is partially imaged and chronic. Other: None. IMPRESSION: No acute abnormality. Atrophy and chronic microvascular ischemic change. Encephalomalacia right frontal lobe subjacent to a craniotomy defect. Chronic bilateral maxillary sinus disease. Electronically Signed   By: Drusilla Kannerhomas  Dalessio M.D.   On: 05/27/2017 16:35    Catarina Hartshornavid Chasitie Passey, DO  [facility] Pager [phone]  If 7PM-7AM, please contact night-coverage [email or web address] Password [email or web address] 06/03/2017, 2:35 PM   LOS: 7 days

## 2017-06-03 NOTE — Progress Notes (Signed)
Daily Progress Note   Patient Name: Jon CutterWilliam P [name]       Date: 06/03/2017 DOB: [birth year]  Age: 83 y.o. MRN#: [id number] Attending Physician: Catarina Hartshornat, David, MD Primary Care Physician: Gareth MorganKnowlton, Steve, MD Admit Date: 05/27/2017  Reason for Consultation/Follow-up: Establishing goals of care, Hospice Evaluation and Psychosocial/spiritual support  Subjective: Jon [name] is lying quietly in bed.  He does not open his eyes or try to communicate with me when I enter the room.  There is no family at bedside at this time.  Jon [name] is easily roused, and is able to tell me his name, and that we are in the hospital.  He thankfully tells me he has no pain.  He is able to drink liquids from a cup without overt signs and symptoms of aspiration.  Call to DSS social worker Kym GroomCarrie Friese, [phone].  Conference related to goals of care, disposition, allowing natural death.  Lyla SonCarrie states that due to interim guardianship, DSS is unable to make end-of-life decisions such as CODE STATUS and or disposition to hospice facility.  Lyla SonCarrie shares that she will be returning to the court house for another matter later today, and will verify rights and responsibilities, limitations to guardianship.  I share that I am willing to complete DNR paperwork for the court, if this will assist in decision making.  Call to longtime friend/pastor at [facility] [employer], Tod Persiaim Kelly at [phone].  We talked at length about [name] current health condition, our concern about his ability to recover, and prognosis of likely weeks.  Tim states that he is in agreement that Jon [name] does not have long to live (in his opinion).  He states that he and the church have been trying to be a support son Tawanna Coolerodd.  Tim states that  Tawanna Coolerodd has been overwhelmed by his family's illnesses, spending the last 10 years in the home, no job, no friends, caring for his parents.  Tim states that Mattelodd's circumstantial/tangential thought process is his normal "coping mechanism".  I share with Tim that [name]'s nonlinear thoughts show his inability to understand the severity of the situation, and properly care for his father.    Tim states that Tawanna Coolerodd would like to take his father home.  I share that unless the church can endorse someone would be  able to stay 24/7, and that Tawanna Cooler would agree to not return [name] to the hospital, this is a very poor choice, and the hospital would would not be able to allow [name] without 24/7 care.   Tim states that Tawanna Cooler had questions in the court hearing yesterday about residential hospice and being able to stay in the facility with his dying father.  Tim states that no one was able to give Tawanna Cooler an answer.  I share that I spoke with Tawanna Cooler in detail last week about hospice home, and that he would be able to stay 24/7.   Tim and I talked about the concept of allowing natural death.  Tim states that he has experience with hospice with his sister and a parent.  We plan a family meeting with Heath Gold, and Claiborne Rigg, pastor of [facility], at bedside for 12/19 at 10 AM.  I reassured him that the medical team, and DSS are also concerned for [facility].  I return later in the day, looking for son Tawanna Cooler.  Tawanna Cooler is not at bedside, but I have a meeting withTim Edwinna Areola and Claiborne Rigg, Yaak of [address].  We speak in detail about Jon [name] current health concerns, and health history.  We talked about the current treatment plan.  I share my concern over Jon [name] is frailty, functional status, and likely impending death.  We talked about concepts such as allowing natural death, we talked about transition to hospice home in detail.  We also speak in detail about [name]'s future  including applying for Medicaid and disability.  Unfortunately, Jorja Loa continues to return to discussions related to his concerns for [place].  I gently reminded him multiple times that although [name]'s future is important, at this point, Jon [name] health concerns and disposition are more important.  Family meeting still on schedule at bedside for 12/19 at 10 AM.  Length of Stay: 7  Current Medications: Scheduled Meds:  . Chlorhexidine Gluconate Cloth  6 each Topical Q0600  . enoxaparin (LOVENOX) injection  40 mg Subcutaneous Q24H  . feeding supplement (PRO-STAT SUGAR FREE 64)  30 mL Oral BID  . multivitamin with minerals  1 tablet Oral Daily  . mupirocin ointment  1 application Nasal BID  . nutrition supplement (JUVEN)  1 packet Oral BID BM  . nystatin   Topical Daily    Continuous Infusions: . ceFEPime (MAXIPIME) IV Stopped (06/03/17 1057)  . norepinephrine (LEVOPHED) Adult infusion 2 mcg/min (06/03/17 0159)  . vancomycin Stopped (06/03/17 0811)    PRN Meds: acetaminophen **OR** acetaminophen, ALPRAZolam, tetrahydrozoline  Physical Exam  Constitutional: No distress.  Frail, thin, acutely/chronically ill.  Appears to be dying  HENT:  Head: Atraumatic.  Temporal wasting  Cardiovascular: Normal rate.  Pulmonary/Chest: Effort normal. No respiratory distress.  Abdominal: Soft. He exhibits no distension.  Musculoskeletal: He exhibits no edema.  Severe muscle wasting in lower extremities, appears to be nonambulatory  Neurological:  Able to tell me his name, and that we are in the hospital, known dementia  Skin: Skin is warm and dry.  Psychiatric:  Known dementia  Nursing note and vitals reviewed.           Vital Signs: BP 97/76   Pulse 90   Temp 98.3 F (36.8 C) (Axillary)   Resp 14   Ht 5\' 6"  (1.676 m)   Wt 68 kg (149 lb 14.6 oz)   SpO2 100%   BMI 24.20  kg/m  SpO2: SpO2: 100 % O2 Device: O2 Device: Nasal Cannula O2 Flow Rate: O2 Flow Rate (L/min): 2  L/min  Intake/output summary:   Intake/Output Summary (Last 24 hours) at 06/03/2017 1212 Last data filed at 06/03/2017 0500 Gross per 24 hour  Intake 233.38 ml  Output 850 ml  Net -616.62 ml   LBM: Last BM Date: 06/02/17 Baseline Weight: Weight: 61.2 kg (135 lb) Most recent weight: Weight: 68 kg (149 lb 14.6 oz)       Palliative Assessment/Data:    Flowsheet Rows     Most Recent Value  Intake Tab  Referral Department  Hospitalist  Unit at Time of Referral  ICU  Palliative Care Primary Diagnosis  Sepsis/Infectious Disease  Date Notified  05/28/17  Palliative Care Type  Return patient Palliative Care  Reason for referral  Clarify Goals of Care  Date of Admission  05/27/17  Date first seen by Palliative Care  05/28/17  # of days Palliative referral response time  0 Day(s)  # of days IP prior to Palliative referral  1  Clinical Assessment  Palliative Performance Scale Score  20%  Pain Max last 24 hours  Not able to report  Pain Min Last 24 hours  Not able to report  Dyspnea Max Last 24 Hours  Not able to report  Dyspnea Min Last 24 hours  Not able to report  Psychosocial & Spiritual Assessment  Palliative Care Outcomes      Patient Active Problem List   Diagnosis Date Noted  . Hypothermia 05/31/2017  . Encounter for hospice care discussion   . Weak 05/27/2017  . Lethargy 05/27/2017  . Leukocytosis   . N&V (nausea and vomiting)   . Goals of care, counseling/discussion   . DNR (do not resuscitate) discussion   . Palliative care encounter   . Pressure injury of skin 08/19/2016  . Septic shock (HCC) 08/19/2016  . Gastric distention   . Heme positive stool   . UGI bleed 08/18/2016  . Sacral decubitus ulcer, stage IV (HCC) 24-Aug-202017  . Epistaxis 05/10/2016  . Diarrhea 05/02/2016  . Prolonged QT interval   . PAF (paroxysmal atrial fibrillation) (HCC)   . Tachycardia-bradycardia syndrome (HCC)   . Cardiac pacemaker in situ   . Hematemesis 05/15/2015  .  Gastrointestinal hemorrhage 05/15/2015  . Malnutrition of moderate degree (HCC) 07/11/2014  . Perianal infection 07/06/2014  . Decubitus ulcer 07/06/2014  . Sacral ulcer (HCC) 07/06/2014  . Anemia of chronic disease 06/16/2014  . Sepsis (HCC) 06/15/2014  . Lower urinary tract infectious disease 06/15/2014  . Acute kidney injury (HCC) 06/15/2014  . Bilateral hydronephrosis 06/15/2014  . Bladder calculi 06/15/2014  . Rhabdomyolysis 06/15/2014  . Hyponatremia 06/15/2014  . Multiple rib fractures 06/15/2014  . Hypotension 06/15/2014  . S/P placement of cardiac pacemaker 06/15/2014  . Dementia 06/15/2014  . AKI (acute kidney injury) (HCC) 06/15/2014  . Knee pain   . Severe dehydration 06/09/2014  . Hypertension 06/09/2014  . Abnormality of gait 05/03/2013  . Essential and other specified forms of tremor 05/03/2013  . Polyneuropathy in other diseases classified elsewhere (HCC) 05/03/2013  . Atrial fibrillation (HCC) 09/07/2012  . Long term current use of anticoagulant therapy 09/07/2012    Palliative Care Assessment & Plan   Patient Profile: 83 y.o.malewith past medical history of dementia, bedbound status approximately 1 year, essential tremor, dysrhythmia, falls with multiple rib fractures, obstructive sleep apnea, does not use CPAP, history of paroxysmal atrial fibrillation with permanent cardiac pacemaker,  tachybradycardia syndromeadmitted on 12/11/2018with sepsis, hyponatremia with sodium 114,multiple bedsores.  Assessment: sepsis, possible sourcemultiple bedsores:White blood cells reduced from 31-21 today, core body temperature reduced to 93. Jon [name] seems to be actively dying. His wounds are, in my opinion, insurmountable.  12/17, some improvement over the weekend, but declined on 12/16 with low blood pressure requiring vasopressors.  hyponatremia with sodium 114:Improving with hydration. Likely to return without the benefits of life sustaining IV fluids.12/18  sodium continues to range from 125-129.   Recommendations/Plan:  At this point, patient and son Tawanna Cooler continue to desire full scope treatment.  Conference with Well[facility] geologist of palliative medicine, Dr. Anderson Malta, who endorses [facility] providers right to NOT perform CPR for futile cases, where CPR would serve only to postpone artificially the moment of death.  This is also endorsed by the [facility] under their statement related to "no code and DO NOT RESUSCITATE orders".  Information shared with hospitalist and nursing staff.  Interim guardianship of Mr. Gatley has been awarded to the [employer] of [facility].  Contact person Kym Groom, DSS SW [phone] fax [phone].  Conference this a.m. related to goals of care, disposition, allowing natural death.  Goals of Care and Additional Recommendations:  Limitations on Scope of Treatment: Treat the treatable, CPR and intubation is not mandated due to medical team's agreement that this would only artificially prolong the time of death.  Code Status:    Code Status Orders  (From admission, onward)        Start     Ordered   05/27/17 2337  Full code  Continuous     05/27/17 2336    Code Status History    Date Active Date Inactive Code Status Order ID Comments User Context   08/22/2016 11:21 08/25/2016 17:32 DNR [id number]  Katheran Awe, NP Inpatient   08/18/2016 20:56 08/22/2016 11:21 Full Code [id number]  Meredeth Ide, MD Inpatient   05/10/2016 15:56 05/13/2016 18:28 Full Code [id number]  Henderson Cloud, MD ED   05/02/2016 12:48 05/06/2016 17:29 DNR [id number]  Leroy Sea, MD Inpatient   05/15/2015 16:03 05/18/2015 18:01 Full Code [id number]  Standley Brooking, MD ED   07/11/2014 11:59 07/12/2014 19:32 Full Code [id number]  Dalia Heading, MD Inpatient   07/06/2014 21:30 07/11/2014 11:59 Full Code [id number]  Doree Albee, MD ED   06/15/2014 10:41 06/18/2014 18:56 Full  Code [id number]  Standley Brooking, MD Inpatient   06/09/2014 16:50 06/11/2014 21:19 Full Code [id number]  Wilson Singer, MD ED       Prognosis:   < 4 weeks or less would not be surprising based on functional status (bedbound for 1 year), severity of bed sores/wounds, frailty, functional status, current infection, dementia, sodium of 114 on admit, white blood cells of 31 on admit. 12/18 unable to maintain core body temperature requiring the use of Bair Hugger warming blanket, vasopressors to maintain blood pressure.  Discharge Planning:  To be determined, in hospital death would not be surprising.  Care plan was discussed with nursing staff, CM, [facility] SW, DSS SW, and Dr. Arbutus Leas.   Thank you for allowing the Palliative Medicine Team to assist in the care of this patient.   Time In: 1020 1410 Time Out: 1130 1440 Total Time 70+30=100 minutes Prolonged Time Billed  yes       Greater than 50%  of this time was spent counseling and coordinating care related  to the above assessment and plan.  Katheran Aweasha A Sanaii Caporaso, NP  Please contact Palliative Medicine Team phone at [phone] for questions and concerns.

## 2017-06-04 DIAGNOSIS — G9341 Metabolic encephalopathy: Secondary | ICD-10-CM

## 2017-06-04 DIAGNOSIS — L89154 Pressure ulcer of sacral region, stage 4: Secondary | ICD-10-CM

## 2017-06-04 DIAGNOSIS — E43 Unspecified severe protein-calorie malnutrition: Secondary | ICD-10-CM

## 2017-06-04 LAB — PROTIME-INR
INR: 1.25
PROTHROMBIN TIME: 15.6 s — AB (ref 11.4–15.2)

## 2017-06-04 LAB — BASIC METABOLIC PANEL
ANION GAP: 7 (ref 5–15)
BUN: 21 mg/dL — ABNORMAL HIGH (ref 6–20)
CHLORIDE: 102 mmol/L (ref 101–111)
CO2: 19 mmol/L — AB (ref 22–32)
Calcium: 7.8 mg/dL — ABNORMAL LOW (ref 8.9–10.3)
Creatinine, Ser: 0.43 mg/dL — ABNORMAL LOW (ref 0.61–1.24)
GFR calc Af Amer: >60 mL/min (ref >60)
GLUCOSE: 110 mg/dL — AB (ref 65–99)
POTASSIUM: 3.9 mmol/L (ref 3.5–5.1)
Sodium: 128 mmol/L — ABNORMAL LOW (ref 135–145)

## 2017-06-04 LAB — CORTISOL-AM, BLOOD: CORTISOL - AM: 17.5 ug/dL (ref 6.7–22.6)

## 2017-06-04 LAB — APTT: aPTT: 42 seconds — ABNORMAL HIGH (ref 24–36)

## 2017-06-04 LAB — CBC
HEMATOCRIT: 24.5 % — AB (ref 39.0–52.0)
HEMOGLOBIN: 8.1 g/dL — AB (ref 13.0–17.0)
MCH: 27.4 pg (ref 26.0–34.0)
MCHC: 33.1 g/dL (ref 30.0–36.0)
MCV: 82.8 fL (ref 78.0–100.0)
Platelets: 124 10*3/uL — ABNORMAL LOW (ref 150–400)
RBC: 2.96 MIL/uL — ABNORMAL LOW (ref 4.22–5.81)
RDW: 17.9 % — ABNORMAL HIGH (ref 11.5–15.5)
WBC: 13.7 10*3/uL — ABNORMAL HIGH (ref 4.0–10.5)

## 2017-06-04 LAB — VITAMIN B12: VITAMIN B 12: 1012 pg/mL — AB (ref 180–914)

## 2017-06-04 LAB — PROCALCITONIN

## 2017-06-04 LAB — MAGNESIUM: Magnesium: 1.7 mg/dL (ref 1.7–2.4)

## 2017-06-04 LAB — OSMOLALITY, URINE: OSMOLALITY UR: 496 mosm/kg (ref 300–900)

## 2017-06-04 LAB — FIBRINOGEN: Fibrinogen: 335 mg/dL (ref 210–475)

## 2017-06-04 MED ORDER — ALPRAZOLAM 0.5 MG PO TABS
0.5000 mg | ORAL_TABLET | Freq: Two times a day (BID) | ORAL | Status: DC
  Administered 2017-06-04 – 2017-06-05 (×2): 0.5 mg via ORAL
  Filled 2017-06-04 (×3): qty 1

## 2017-06-04 MED ORDER — POTASSIUM CHLORIDE IN NACL 20-0.9 MEQ/L-% IV SOLN
INTRAVENOUS | Status: DC
  Administered 2017-06-04 – 2017-06-05 (×2): via INTRAVENOUS

## 2017-06-04 MED ORDER — ALBUMIN HUMAN 25 % IV SOLN
25.0000 g | Freq: Once | INTRAVENOUS | Status: AC
  Administered 2017-06-04: 25 g via INTRAVENOUS
  Filled 2017-06-04: qty 100

## 2017-06-04 NOTE — Clinical Social Work Note (Signed)
Per Palliative care NP, [facility].

## 2017-06-04 NOTE — Clinical Social Work Note (Signed)
Patient Ethnicity & Race   Ethnic Group Patient Race  Not Hispanic or Latino White or Caucasian  Emergency Contact(s)   Name Relation Home Work [place]  Mowrer,[name] Son [phone]  [phone]  Documents on File    Status Date Received Description  Documents for the Patient  Historic Radiology Documentation Not Received    Release of Information Not Received    [place] HIPAA NOTICE OF PRIVACY - Scanned Not Received    [place] E-Signature HIPAA Notice of Privacy Received 08/13/12   Driver's License Not Received    Insurance Card Received 08/13/12   Advance Directives/Living Will/HCPOA/POA Not Received    Insurance Card Not Received    [place] E-Signature HIPAA Notice of Privacy [place] Received 12/07/12   Advanced Beneficiary Notice (ABN) Not Received    Insurance Card Not Received  Medicare/BCBS [employer]   Insurance Card Received 08/26/13 chmgh/nl/tst  [place] E-Signature HIPAA Notice of Privacy Received 08/26/13 chmgh/nl/tst  AMB Correspondence  09/14/13 07/14-01/15 Standing order Sol  AMB Correspondence  10/07/13 AUTHORIZATION [facility]  AMB Correspondence  10/25/13 11/14 [facility]  AMB Provider Completed Forms  03/01/14 AUTHORIZATION [facility]  AMB Correspondence  07/14/14 AUTH TO RENEW STANDING ORDERS [facility]  Other Photo ID Not Received    AMB Correspondence  10/02/14 02/14-03/14 OFFICE NOTE [facility]  AMB Provider Completed Forms  12/21/14 STANDING ORDERS REQUEST [facility]  AMB Correspondence  12/29/14 AUTHORIZATION RENEW ORDERS [facility]  [place] E-Signature HIPAA Notice of Privacy Signed 05/10/16   Insurance Card   Medicare Card, 2018  AMB Provider Completed Forms (Deleted) 12/21/14 ORDERS  [facility]  Documents for the Encounter  AOB (Assignment of Insurance Benefits) Not Received    E-signature AOB Signed 05/27/17   MEDICARE RIGHTS Not Received    E-signature  Medicare Rights Signed 05/27/17   Procedural Consent Signed 05/27/17   ED Patient Billing Extract   ED PB Billing Extract  Cardiac Monitoring Strip Shift Summary Received 05/27/17   Cardiac Monitoring Strip Received 05/28/17   EKG Received 05/28/17   Admission Information   Attending Provider Admitting Provider Admission Type Admission Date/Time  Tat, Onalee Huaavid, MD Pearson GrippeKim, James, MD Emergency 05/27/17 1526  Discharge Date Hospital Service Auth/Cert Status Service Area   Acute Care Incomplete [place] SERVICE AREA  Unit Room/Bed Admission Status   AP-ICCUP NURSING IC02/IC02-01 Admission (Confirmed)   Admission   Complaint  ---  Hospital Account   Name Acct ID Class Status Primary Coverage  Luciano CutterLamberth, Johnchristopher P [id number] Inpatient Open MEDICARE - MEDICARE PART A AND B      Guarantor Account (for Hospital Account [id number])   Name Relation to Pt Service Area Active? Acct Type  Luciano CutterLamberth, Larson P Self CHSA Yes Personal/Family  Address Phone    [address] [place], [place] [address] [phone])        Coverage Information (for Hospital Account [id number])   1. MEDICARE/MEDICARE PART A AND B   F/O Payor/Plan Precert #  MEDICARE/MEDICARE PART A AND B   Subscriber Subscriber #  Luciano CutterLamberth, Carston P [id number]  Address Phone  [address] [place], [place] [phone]   2. BLUE CROSS BLUE SHIELD/BCBS SUPPLEMENT   F/O Payor/Plan Precert #  BLUE CROSS BLUE SHIELD/BCBS SUPPLEMENT   Subscriber Subscriber #  Luciano CutterLamberth, Jayren P [id number]  Address Phone  [address] [place], [place] [phone]

## 2017-06-04 NOTE — Progress Notes (Signed)
PROGRESS NOTE  Jon CutterWilliam P [name] VWU:[id number] DOB: February 03, 1932 DOA: 05/27/2017 PCP: Gareth MorganKnowlton, Steve, MD  Brief History:  83 year old male with a history of paroxysmal defibrillation, tachybradycardia syndrome status post permanent pacemaker 2006, diastolic CHF, hypertension, hyponatremia, sacral decubitus ulcer, and dementia presenting with generalized weakness.  In the emergency department, the patient was noted to be hypotensive with WBC 31.2.  The patient was placed on vasopressors after a left femoral triple-lumen catheter was placed.  He was started on Zosyn and vancomycin.  He was subsequently weaned off of vasopressors.  His Zosyn was discontinued on May 31, 2017.  The patient subsequently became hypotensive again on June 01, 2017 requiring Levophed to be restarted.  Initial urinalysis was negative for pyuria.  Blood cultures at the time of admission have been negative.  Because of concerns of poor care at home, APS referral was made.  Palliative medicine was consulted to assist with management.  Assessment/Plan: Sepsis -Present at the time of admission -May 27, 2017 chest x-ray negative -May 27, 2017 blood cultures negative -May 27, 2017 urinalysis negative for pyuria -Patient continues to require Levophed  -check lactic acid--1.0 -check PCT <0.10 -am cortisol--17.5 -remains on Levophed -WBC starting to trend back up--CT abd/pelvis if continues to trend up -am CBC  Pyuria -12/18 UA--TNTC WBC -continue cefepime pending culture data  Stage 4 sacral decubitus -CT abd/pelvis if WBC continues to climb -continue wound care  Acute Metabolic Encephalopathy -due to sepsis and hypotension -Appears to be back to baseline, able to answer questions appropriately although still has poor insight into the severity of his illness.   AKI -presenting creatinine 1.09 -baseline creatinine 0.3-0.6 -due to volume depletion and sepsis  Severe protein  caloric malnutrition -Nutrition consultation appreciated. -continue supplements  Hyponatremia -due to hypovolemia, poor solute intake and a degree of SIADH due to acute illness -Improving with IV fluids, -FeNa--0.47% -check urine osm--496, serum osm--269  Hypokalemia -Replaced orally.   Hypomagnesemia -IV replacement ordered.  Hypothermia- slowly improving with warming blanket. Multifactorial causes for this given severe sepsis and malnutrition. -TSH 1.711 -am cortisol--17/5  Paroxysmal atrial fibrillation -Rate controlled at present.  -Home BETAPACE on hold due to hypotension. -previously on warfarin--unclear reason for discontinueation  Thrombocytopenia -chronic -acute worsening due to sepsis -check B12-1012 -check fibrinogen-335, INR--1.25  Goals of Care -appreciate palliative medicine consultation and follow up -overall prognosis is very poor due to age, co-morbidities, frailty  -despite maximal medical care in ICU setting, patient remains critically ill with high risk of mortality  -now transitioned to DNR after family meeting   Disposition Plan:   Remain in ICU Family Communication:   Son updated--The patient is critically ill with multiple organ systems failure and requires high complexity decision making for assessment and support, frequent evaluation and titration of therapies, application of advanced monitoring technologies and extensive interpretation of multiple databases.  Critical care time - 40 mins.    Consultants:  none  Code Status:  FULL   DVT Prophylaxis:  SCDs   Procedures: As Listed in Progress Note Above  Antibiotics: Vanco 12/11>>> Zosyn 12/11>>>12/15 Cefepime 12/17>>>       Subjective: Patient denies any fevers, chills, headache, chest pain, shortness breath, abdominal pain, diarrhea.  His appetite is still poor.  Objective: Vitals:   06/04/17 1100 06/04/17 1144 06/04/17 1200 06/04/17 1300  BP: 94/69   102/71 102/72  Pulse:      Resp: 11  10 14   Temp:  Marland Kitchen(!)  97.5 F (36.4 C)    TempSrc:  Axillary    SpO2:      Weight:      Height:        Intake/Output Summary (Last 24 hours) at 06/04/2017 1820 Last data filed at 06/04/2017 1500 Gross per 24 hour  Intake 787.58 ml  Output 575 ml  Net 212.58 ml   Weight change: -5.8 kg (-12.6 oz) Exam:   General:  Pt is alert, follows commands appropriately, not in acute distress  HEENT: No icterus, No thrush, No neck mass, Rake/AT  Cardiovascular: RRR, S1/S2, no rubs, no gallops  Respiratory: Bibasilar rales.  No wheezing.  Good air movement.  Abdomen: Soft/+BS, non tender, non distended, no guarding  Extremities: 2 + LEedema, No lymphangitis, No petechiae, No rashes, no synovitis   Data Reviewed: I have personally reviewed following labs and imaging studies Basic Metabolic Panel: Recent Labs  Lab 05/31/17 0438 06/01/17 0545 06/02/17 0414 06/03/17 0506 06/04/17 0501  NA 125* 129* 126* 127* 128*  K 4.7 4.5 3.7 3.3* 3.9  CL 98* 102 101 100* 102  CO2 21* 21* 19* 18* 19*  GLUCOSE 103* 96 112* 110* 110*  BUN 32* 30* 28* 22* 21*  CREATININE 0.53* 0.50* 0.38* 0.40* 0.43*  CALCIUM 8.0* 8.0* 7.8* 7.8* 7.8*  MG 2.0 1.9 1.6* 1.8 1.7   Liver Function Tests: Recent Labs  Lab 05/29/17 0456  AST 20  ALT 12*  ALKPHOS 74  BILITOT 0.6  PROT 4.3*  ALBUMIN 1.9*   No results for input(s): LIPASE, AMYLASE in the last 168 hours. No results for input(s): AMMONIA in the last 168 hours. Coagulation Profile: Recent Labs  Lab 06/04/17 0501  INR 1.25   CBC: Recent Labs  Lab 05/31/17 0438 06/01/17 0545 06/02/17 0414 06/03/17 0506 06/04/17 0501  WBC 10.0 8.2 12.9* 10.6* 13.7*  HGB 9.0* 9.6* 8.9* 8.6* 8.1*  HCT 27.9* 29.2* 27.1* 26.6* 24.5*  MCV 83.0 83.0 83.6 83.1 82.8  PLT 115* 82* 137* 115* 124*   Cardiac Enzymes: No results for input(s): CKTOTAL, CKMB, CKMBINDEX, TROPONINI in the last 168 hours. BNP: Invalid input(s):  POCBNP CBG: No results for input(s): GLUCAP in the last 168 hours. HbA1C: No results for input(s): HGBA1C in the last 72 hours. Urine analysis:    Component Value Date/Time   COLORURINE YELLOW 06/03/2017 1428   APPEARANCEUR HAZY (A) 06/03/2017 1428   LABSPEC 1.015 06/03/2017 1428   PHURINE 5.0 06/03/2017 1428   GLUCOSEU NEGATIVE 06/03/2017 1428   HGBUR SMALL (A) 06/03/2017 1428   BILIRUBINUR NEGATIVE 06/03/2017 1428   KETONESUR NEGATIVE 06/03/2017 1428   PROTEINUR NEGATIVE 06/03/2017 1428   UROBILINOGEN 0.2 03/14/2015 1515   NITRITE NEGATIVE 06/03/2017 1428   LEUKOCYTESUR LARGE (A) 06/03/2017 1428   Sepsis Labs: @LABRCNTIP (procalcitonin:4,lacticidven:4) ) Recent Results (from the past 240 hour(s))  Culture, Urine     Status: Abnormal   Collection Time: 05/27/17  5:29 PM  Result Value Ref Range Status   Specimen Description URINE, CATHETERIZED  Final   Special Requests NONE  Final   Culture MULTIPLE SPECIES PRESENT, SUGGEST RECOLLECTION (A)  Final   Report Status 05/30/2017 FINAL  Final  Blood culture (routine x 2)     Status: None   Collection Time: 05/27/17  8:02 PM  Result Value Ref Range Status   Specimen Description BLOOD RIGHT ARM  Final   Special Requests   Final    BOTTLES DRAWN AEROBIC AND ANAEROBIC Blood Culture results may not be optimal due  to an inadequate volume of blood received in culture bottles   Culture NO GROWTH 5 DAYS  Final   Report Status 06/01/2017 FINAL  Final  Blood culture (routine x 2)     Status: None   Collection Time: 05/27/17  8:06 PM  Result Value Ref Range Status   Specimen Description BLOOD RIGHT HAND  Final   Special Requests   Final    BOTTLES DRAWN AEROBIC AND ANAEROBIC Blood Culture results may not be optimal due to an inadequate volume of blood received in culture bottles   Culture NO GROWTH 5 DAYS  Final   Report Status 06/01/2017 FINAL  Final  MRSA PCR Screening     Status: Abnormal   Collection Time: 05/27/17 10:51 PM  Result  Value Ref Range Status   MRSA by PCR POSITIVE (A) NEGATIVE Final    Comment:        The GeneXpert MRSA Assay (FDA approved for NASAL specimens only), is one component of a comprehensive MRSA colonization surveillance program. It is not intended to diagnose MRSA infection nor to guide or monitor treatment for MRSA infections. RESULT CALLED TO, READ BACK BY AND VERIFIED WITH: ALLEN N. AT 0900 ON [id number] BY THOMPSON S.      Scheduled Meds: . ALPRAZolam  0.5 mg Oral BID  . feeding supplement (PRO-STAT SUGAR FREE 64)  30 mL Oral BID  . nutrition supplement (JUVEN)  1 packet Oral BID BM  . nystatin   Topical Daily   Continuous Infusions: . ceFEPime (MAXIPIME) IV 1 g (06/04/17 1758)  . norepinephrine (LEVOPHED) Adult infusion 3 mcg/min (06/03/17 2300)    Procedures/Studies: Dg Chest 1 View  Result Date: 05/27/2017 CLINICAL DATA:  Low blood pressure. EXAM: CHEST 1 VIEW COMPARISON:  08/19/2016 . FINDINGS: Previously identified PICC line and NG tube normal chest x-ray 08/19/2016 have been removed. Cardiac pacer with lead tip over the right atrium and right ventricle. Heart size stable. Lung volumes with mild bibasilar atelectasis. Previously identified right base infiltrate has largely cleared. No pleural effusion or pneumothorax . IMPRESSION: 1. Cardiac pacer noted with lead tip over the right atrium and right ventricle. Heart size stable. 2. Low lung volumes with mild basilar atelectasis . Previously identified infiltrate right lung base has largely cleared. Electronically Signed   By: Maisie Fushomas  Register   On: 05/27/2017 16:26   Ct Head Wo Contrast  Result Date: 05/27/2017 CLINICAL DATA:  Hypotension and weakness today. EXAM: CT HEAD WITHOUT CONTRAST TECHNIQUE: Contiguous axial images were obtained from the base of the skull through the vertex without intravenous contrast. COMPARISON:  Head CT scan 06/09/2014. FINDINGS: Brain: There is cortical atrophy and chronic microvascular ischemic  change. Encephalomalacia right frontal lobe subjacent to a craniotomy defect is unchanged. No evidence of acute abnormality including hemorrhage, infarct, mass lesion, mass effect, midline shift or abnormal extra-axial fluid collection. No hydrocephalus or pneumocephalus. Vascular: No hyperdense vessel or unexpected calcification. Skull: No fracture.  Craniotomy defect noted. Sinuses/Orbits: Extensive mucosal thickening in the maxillary sinuses bilaterally is partially imaged and chronic. Other: None. IMPRESSION: No acute abnormality. Atrophy and chronic microvascular ischemic change. Encephalomalacia right frontal lobe subjacent to a craniotomy defect. Chronic bilateral maxillary sinus disease. Electronically Signed   By: Drusilla Kannerhomas  Dalessio M.D.   On: 05/27/2017 16:35   Dg Chest Port 1 View  Result Date: 06/03/2017 CLINICAL DATA:  PICC placement. EXAM: PORTABLE CHEST 1 VIEW COMPARISON:  Radiograph earlier this day at 1751 hour FINDINGS: Previous left upper extremity PICC is  been removed. New right upper extremity PICC with tip in the distal SVC. Unchanged borderline cardiomegaly and mediastinal contours. Atherosclerosis of the thoracic aorta. Hazy opacity in the lung bases and perihilar regions appear similar to prior. No pneumothorax. Left-sided pacemaker remains in place. IMPRESSION: 1. Tip of the right upper extremity PICC in the distal SVC. Previous left arm PICC has been removed. 2. Unchanged hazy lung base and perihilar opacity which may be pleural effusions, atelectasis, pulmonary edema or combination thereof. Electronically Signed   By: Rubye Oaks M.D.   On: 06/03/2017 22:38   Dg Chest Port 1 View  Result Date: 06/03/2017 CLINICAL DATA:  Dyspnea.  PICC line placement. EXAM: PORTABLE CHEST 1 VIEW COMPARISON:  05/27/2017 FINDINGS: Apparent left-sided PICC line curls in the medial left upper arm extending to the left axilla all projecting inferior to the left humeral head. Cardiac silhouette is  mildly enlarged. There is hazy opacity in the perihilar and lung bases consistent with a combination of pleural effusions with either atelectasis or dependent lung base edema or a combination. Left anterior chest wall sequential pacemaker is stable and well positioned. IMPRESSION: Left PICC is curled in the upper left arm. It does not enter the thorax. Electronically Signed   By: Amie Portland M.D.   On: 06/03/2017 20:07    Catarina Hartshorn, DO  [facility] Pager [phone]  If 7PM-7AM, please contact night-coverage [email or web address] Password [email or web address] 06/04/2017, 6:20 PM   LOS: 8 days

## 2017-06-04 NOTE — Plan of Care (Signed)
Phone call from DSS Lou Minersupervisor,Carye Dickerson [phone].  She states that DSS is in agreement for DNR, transfer to hospice home.  She asks if anything needs to be cosigned by DSS.  I share that DNR form does not need cosign.  I provide her with the contact information for the local hospice home and encouraged her to call them related to their needs. Beverlyn RouxCarye Dickerson states that their attorney reviewed [place] [facility] [place] and feels that [phone] [phone] speaks clearly to end-of-life rights and responsibilities. No charge.

## 2017-06-04 NOTE — Progress Notes (Signed)
Daily Progress Note   Patient Name: Jon CutterWilliam P [name]       Date: 06/04/2017 DOB: 1932/02/03  Age: 83 y.o. MRN#: [id number] Attending Physician: Jon [name], David, MD Primary Care Physician: Jon [name], Steve, MD Admit Date: 05/27/2017  Reason for Consultation/Follow-up: Establishing goals of care, Inpatient hospice referral and Psychosocial/spiritual support  Subjective: Jon [name] is resting quietly in bed.  He is more awake today.  Present at bedside is son April Holdingodd, Deacon of church Jon [name], and pastor of [employer], Jon RossettiRyan KianaBurris.  We go to my office for a family meeting.  Chaplain Jon [name] arrives within a few minutes of our start.  We talked in detail about Jon [name]'s chronic and acute health history.  We discuss the current treatment plan.  We also discuss labs in detail including albumin of 1.9 and sequela of low albumin.  We discuss what we can and cannot change.  We explore a life review for Jon [name] including his work history, and what gives him pleasure.  We talked about his functional status being bedbound for approximately 1 year.  Jon [name] talks about his hopes and fears.  Extensive discussions relating to not only care of Jon [name], but also [employer]odd.  We talked about how to make choices for loved ones including 1) keeping them at the center, 2) are we doing something to them or for them, and 3) what would they say about their quality of life.  We explored the choices for Jon [name], the psychosocial difficulties of so much family loss over the past 2-1/2-3 years, and the uncertainty of the future.  Jon [name] and legs with support of his church to allow a natural death (DNR).  We talked in detail about [facility], with a can and cannot do.  Jon [name] shares his  concerns about having his father not be able to eat or drink.  We discussed in detail about what is normal and expected at end of life including; sleeping more, eating and interacting less.  We talked about Jon [name] coming to the hospital severely dehydrated, even though Jon [name] was doing his best to care for his father.  We share that hospice home will provide food for Jon [name], and they have a kitchen so that Jon [name] can have his church bring meals.  Jon [name] asks  again if he can have hospice services and home.  I share that this is not a safe plan, and although Jon [name] has done his part in caring for his father, but now is the time to allow others to help him.  Jon [name] states that he will take Jon [name] to the [facility] today so that he can see the facility.  Conference with social worker [place] related to disposition, residential hospice. Conference with hospitalist, Dr. Arbutus [name].  Call to [facility], case discussed with intake Technical sales engineer.  Call to DSS Jon [name] [phone], left detailed voicemail message related to disposition, CODE STATUS, and any requirements/impediments that DSS would have related to CODE STATUS and or disposition to residential hospice.  Length of Stay: 8  Current Medications: Scheduled Meds:  . enoxaparin (LOVENOX) injection  40 mg Subcutaneous Q24H  . feeding supplement (PRO-STAT SUGAR FREE 64)  30 mL Oral BID  . multivitamin with minerals  1 tablet Oral Daily  . nutrition supplement (JUVEN)  1 packet Oral BID BM  . nystatin   Topical Daily    Continuous Infusions: . ceFEPime (MAXIPIME) IV 1 g (06/04/17 1039)  . norepinephrine (LEVOPHED) Adult infusion 3 mcg/min (06/03/17 2300)  . vancomycin      PRN Meds: acetaminophen **OR** acetaminophen, ALPRAZolam, tetrahydrozoline  Physical Exam  Constitutional: No distress.  Appears weak and frail, acutely/chronically ill.  Briefly makes but does not  keep eye contact.  Able to communicate his needs.  HENT:  Head: Atraumatic.  Temporal wasting  Cardiovascular: Normal rate.  Pulmonary/Chest: Effort normal and breath sounds normal.  Abdominal: Soft. He exhibits no distension.  Ostomy  Musculoskeletal: He exhibits no edema.  Severe muscle wasting and wounds on bilateral lower extremities, obviously bedbound, nonambulatory.  Neurological: He is alert.  Known dementia  Skin: Skin is warm and dry.  Nursing note and vitals reviewed.           Vital Signs: BP 92/67   Pulse 83   Temp 98.3 F (36.8 C) (Core (Comment))   Resp 12   Ht 5\' 6"  (1.676 m)   Wt 62.2 kg (137 lb 2 oz)   SpO2 100%   BMI 22.13 kg/m  SpO2: SpO2: 100 % O2 Device: O2 Device: Not Delivered O2 Flow Rate: O2 Flow Rate (L/min): 2 L/min  Intake/output summary:   Intake/Output Summary (Last 24 hours) at 06/04/2017 1130 Last data filed at 06/04/2017 9811 Gross per 24 hour  Intake 639.83 ml  Output 575 ml  Net 64.83 ml   LBM: Last BM Date: 06/03/17 Baseline Weight: Weight: 61.2 kg (135 lb) Most recent weight: Weight: 62.2 kg (137 lb 2 oz)       Palliative Assessment/Data:    Flowsheet Rows     Most Recent Value  Intake Tab  Referral Department  Hospitalist  Unit at Time of Referral  ICU  Palliative Care Primary Diagnosis  Sepsis/Infectious Disease  Date Notified  05/28/17  Palliative Care Type  Return patient Palliative Care  Reason for referral  Clarify Goals of Care  Date of Admission  05/27/17  Date first seen by Palliative Care  05/28/17  # of days Palliative referral response time  0 Day(s)  # of days IP prior to Palliative referral  1  Clinical Assessment  Palliative Performance Scale Score  20%  Pain Max last 24 hours  Not able to report  Pain Min Last 24 hours  Not able to report  Dyspnea Max Last 24 Hours  Not able to report  Dyspnea Min Last 24 hours  Not able to report  Psychosocial & Spiritual Assessment  Palliative Care Outcomes        Patient Active Problem List   Diagnosis Date Noted  . Severe protein-calorie malnutrition (HCC) 06/03/2017  . Acute metabolic encephalopathy 06/03/2017  . Hypothermia 05/31/2017  . Encounter for hospice care discussion   . Weak 05/27/2017  . Lethargy 05/27/2017  . Leukocytosis   . N&V (nausea and vomiting)   . Goals of care, counseling/discussion   . DNR (do not resuscitate) discussion   . Palliative care encounter   . Pressure injury of skin 08/19/2016  . Septic shock (HCC) 08/19/2016  . Gastric distention   . Heme positive stool   . UGI bleed 08/18/2016  . Sacral decubitus ulcer, stage IV (HCC) 04/03/202017  . Epistaxis 05/10/2016  . Diarrhea 05/02/2016  . Prolonged QT interval   . PAF (paroxysmal atrial fibrillation) (HCC)   . Tachycardia-bradycardia syndrome (HCC)   . Cardiac pacemaker in situ   . Hematemesis 05/15/2015  . Gastrointestinal hemorrhage 05/15/2015  . Malnutrition of moderate degree (HCC) 07/11/2014  . Perianal infection 07/06/2014  . Decubitus ulcer 07/06/2014  . Sacral ulcer (HCC) 07/06/2014  . Anemia of chronic disease 06/16/2014  . Sepsis (HCC) 06/15/2014  . Lower urinary tract infectious disease 06/15/2014  . Acute kidney injury (HCC) 06/15/2014  . Bilateral hydronephrosis 06/15/2014  . Bladder calculi 06/15/2014  . Rhabdomyolysis 06/15/2014  . Hyponatremia 06/15/2014  . Multiple rib fractures 06/15/2014  . Hypotension 06/15/2014  . S/P placement of cardiac pacemaker 06/15/2014  . Dementia 06/15/2014  . AKI (acute kidney injury) (HCC) 06/15/2014  . Knee pain   . Severe dehydration 06/09/2014  . Hypertension 06/09/2014  . Abnormality of gait 05/03/2013  . Essential and other specified forms of tremor 05/03/2013  . Polyneuropathy in other diseases classified elsewhere (HCC) 05/03/2013  . Atrial fibrillation (HCC) 09/07/2012  . Long term current use of anticoagulant therapy 09/07/2012    Palliative Care Assessment & Plan   Patient  Profile: 83 y.o.malewith past medical history of dementia, bedbound status approximately 1 year, essential tremor, dysrhythmia, falls with multiple rib fractures, obstructive sleep apnea, does not use CPAP, history of paroxysmal atrial fibrillation with permanent cardiac pacemaker, tachybradycardia syndromeadmitted on 12/11/2018with sepsis, hyponatremia with sodium 114,multiple bedsores.  Assessment: sepsis, possible sourcemultiple bedsores:White blood cells reduced from 31-21 today, core body temperature reduced to 93. Jon [name] seems to be actively dying. His wounds are, in my opinion, insurmountable.12/17, some improvement over the weekend, but declined on 12/16 with low blood pressure requiring vasopressors.  hyponatremia with sodium 114:Improving with hydration. Likely to return without the benefits of life sustaining IV fluids.12/18 sodium continues to range from 125-129. 12/18 today's labs include relatively normal kidney function, sodium of 128, unfortunately albumin remains incredibly low at 1.9.  Recommendations/Plan:  During family meeting today with son Jon [name], and pastor Claiborne Rigg, and chaplain Jon Field from our hospital, Jon [name] has elected to allow a natural death (DNR) AND transition to hospice home of [facility] for comfort and dignity at end of life.  Full comfort care.  Goals of Care and Additional Recommendations:  Limitations on Scope of Treatment: Full Comfort Care  Code Status:    Code Status Orders  (From admission, onward)        Start     Ordered   06/04/17 1045  Do not attempt resuscitation (DNR)  Continuous    Question Answer Comment  In the event of cardiac or respiratory ARREST Do not call a "code blue"   In the event of cardiac or respiratory ARREST Do not perform Intubation, CPR, defibrillation or ACLS   In the event of cardiac or respiratory ARREST Use medication by any route, position, wound care, and other measures  to relive pain and suffering. May use oxygen, suction and manual treatment of airway obstruction as needed for comfort.      06/04/17 1044    Code Status History    Date Active Date Inactive Code Status Order ID Comments User Context   05/27/2017 23:36 06/04/2017 10:44 Full Code [id number]  Pearson GrippeKim, James, MD Inpatient   08/22/2016 11:21 08/25/2016 17:32 DNR [id number]  Katheran Aweove, Jandi Swiger A, NP Inpatient   08/18/2016 20:56 08/22/2016 11:21 Full Code [id number]  Meredeth IdeLama, Gagan S, MD Inpatient   05/10/2016 15:56 05/13/2016 18:28 Full Code [id number]  Henderson CloudHernandez Acosta, Estela Y, MD ED   05/02/2016 12:48 05/06/2016 17:29 DNR [id number]  Leroy SeaSingh, Prashant K, MD Inpatient   05/15/2015 16:03 05/18/2015 18:01 Full Code [id number]  Standley BrookingGoodrich, Daniel P, MD ED   07/11/2014 11:59 07/12/2014 19:32 Full Code [id number]  Dalia HeadingJenkins, Mark A, MD Inpatient   07/06/2014 21:30 07/11/2014 11:59 Full Code [id number]  Doree AlbeeNewton, Steven, MD ED   06/15/2014 10:41 06/18/2014 18:56 Full Code [id number]  Standley BrookingGoodrich, Daniel P, MD Inpatient   06/09/2014 16:50 06/11/2014 21:19 Full Code [id number]  Wilson SingerGosrani, Nimish C, MD ED       Prognosis:   < 2 weeks or less would not be surprising based on functional status (bedbound for 1 year),severity of bed sores/wounds,frailty, functional status, current infection, dementia, sodium of 114 on admit, white blood cells of 31 on admit. 12/18 unable to maintain core body temperature requiring the use of Bair Hugger warming blanket, vasopressors to maintain blood pressure.  Discharge Planning:  Requesting transition to comfort care[facility] for comfort and dignity at end of life.  Care plan was discussed with nursing staff, case management, social worker, chaplain Waynetta SandyWight and Dr. Arbutus Leasat.  Thank you for allowing the Palliative Medicine Team to assist in the care of this patient.   Time In: 1000 Time Out: 1150 Total Time 110 minutes Prolonged Time Billed  yes       Greater than 50%  of this time was  spent counseling and coordinating care related to the above assessment and plan.  Katheran Aweasha A Kipton Skillen, NP  Please contact Palliative Medicine Team phone at [phone] for questions and concerns.

## 2017-06-05 DIAGNOSIS — L89154 Pressure ulcer of sacral region, stage 4: Secondary | ICD-10-CM

## 2017-06-05 DIAGNOSIS — R0609 Other forms of dyspnea: Secondary | ICD-10-CM

## 2017-06-05 DIAGNOSIS — R0600 Dyspnea, unspecified: Secondary | ICD-10-CM

## 2017-06-05 LAB — URINE CULTURE: Culture: NO GROWTH

## 2017-06-05 NOTE — Clinical Social Work Note (Signed)
Pt accepted at [facility] per Cassandra. Transfer planned for today at noon. [facility] to transport. Updated pt's son that he needs to arrive at the Hospice Home before pt so that he can sign the admission paperwork. Updated pt's RN and provided transfer envelope with phone number for report. NP, Tasha, to complete DNR paperwork for pt's dc. There are no other SW needs for dc.

## 2017-06-05 NOTE — Progress Notes (Signed)
Patient resting in bed with eyes closed. Vital signs are stable. Picc line removed to right arm. Bandage in tact. Foley intact. Wounds covered with clean, dry, dressings. Patient denies pain. [facility]. [facility] [place], [place]. Son accompanied at the bedside.

## 2017-06-05 NOTE — Discharge Summary (Signed)
Physician Discharge Summary  Jon [name] ZOX:[id number] DOB: 1931/12/26 DOA: 05/27/2017  PCP: Gareth MorganKnowlton, Steve, MD  Admit date: 05/27/2017 Discharge date: 06/05/2017  Admitted From: Home Disposition:  Residential Hospice     Discharge Condition: Stable CODE STATUS: FULL COMFORT Diet recommendation:  Regular   Brief/Interim Summary: 83 year old male with a history of paroxysmal atrial fibrillation, tachybradycardia syndrome status post permanent pacemaker 2006, diastolic CHF, hypertension, hyponatremia, sacral decubitus ulcer, and dementia presenting with generalized weakness. In the emergency department, the patient was noted to be hypotensive with WBC 31.2. The patient was placed on vasopressors after a left femoral triple-lumen catheter was placed. He was started on Zosyn and vancomycin. He was subsequently weaned off of vasopressors. His Zosyn was discontinued on May 31, 2017. The patient subsequently became hypotensive again on June 01, 2017 requiring Levophed to be restarted. Initial urinalysis was negative for pyuria. Blood cultures at the time of admission have been negative. Because of concerns of poor care at home, APS referral was made. Palliative medicine was consulted to assist with management.  The patient continued to remain hypotensive despite appropriate antibiotics.  A.m. cortisol level was appropriate.  Patient continued to have very poor oral intake.  Given the patient's severe protein malnutrition, multiple comorbidities, age, and overall debilitated status, his overall prognosis was very poor.  Multiple meetings were held with the family with palliative medicine.  Given the patient's overall poor prognosis and lack of improvement despite maximal therapy, a mature decision was made to transition the patient's focus of care to focus on full comfort.  Ultimately, the patient was transitioned to residential hospice.    Discharge Diagnoses:    Sepsis -due to infected sacral decubitus and thoracic decubitus ulcers -Present at the time of admission -May 27, 2017 chest x-ray negative -May 27, 2017 blood cultures negative -May 27, 2017 urinalysis negative for pyuria -Patient continues to require Levophed -check lactic acid--1.0 -check PCT <0.10 -am cortisol--17.5 -remains on Levophed -WBC starting to trend back up--CT abd/pelvis if continues to trend up Given the patient's severe protein malnutrition, multiple comorbidities, age, and overall debilitated status, his overall prognosis was very poor.  Multiple meetings were held with the family with palliative medicine.  Given the patient's overall poor prognosis and lack of improvement despite maximal therapy, a mature decision was made to transition the patient's focus of care to focus on full comfort.  Ultimately, the patient was transitioned to residential hospice.  Pyuria -12/18 UA--TNTC WBC -continued cefepime pending culture data -Given the patient's severe protein malnutrition, multiple comorbidities, age, and overall debilitated status, his overall prognosis was very poor.  Multiple meetings were held with the family with palliative medicine.  Given the patient's overall poor prognosis and lack of improvement despite maximal therapy, a mature decision was made to transition the patient's focus of care to focus on full comfort.  Ultimately, the patient was transitioned to residential hospice.  Stage 4 sacral decubitus -CT abd/pelvis if WBC continues to climb -continue wound care  Acute Metabolic Encephalopathy -due to sepsis and hypotension -Appears to be back to baseline, able to answer questions appropriately although still has poor insight into the severity of his illness.   AKI -presenting creatinine 1.09 -baseline creatinine 0.3-0.6 -due to volume depletion and sepsis  Severe protein caloric malnutrition -Nutrition consultation  appreciated. -continue supplements  Hyponatremia -due to hypovolemia, poor solute intake and a degree of SIADH due to acute illness -Improving with IV fluids, -FeNa--0.47% -check urine osm--496, serum osm--269  Hypokalemia -Replaced  orally.   Hypomagnesemia -IV replacement ordered.  Hypothermia- slowly improving with warming blanket. Multifactorial causes for this given severe sepsis and malnutrition. -TSH 1.711 -am cortisol--17/5  Paroxysmal atrial fibrillation -Rate controlled at present.  -Home BETAPACE on holddue to hypotension. -previously on warfarin--unclear reason for discontinueation  Thrombocytopenia -chronic -acute worsening due to sepsis -check B12-1012 -check fibrinogen-335, INR--1.25  Goals of Care -appreciate palliative medicine consultation and follow up -overall prognosis is very poor due to age, co-morbidities, frailty  -despite maximal medical care in ICU setting, patient remains critically ill with high risk of mortality  Given the patient's severe protein malnutrition, multiple comorbidities, age, and overall debilitated status, his overall prognosis was very poor.  Multiple meetings were held with the family with palliative medicine.  Given the patient's overall poor prognosis and lack of improvement despite maximal therapy, a mature decision was made to transition the patient's focus of care to focus on full comfort.  Ultimately, the patient was transitioned to residential hospice.     Discharge Instructions   Allergies as of 06/05/2017      Reactions   Iohexol     Desc: PT STATES THROAT/FACE SWELLING W/ IVP DYE IN 1990. PT HAS NOT HAD ANY EXAMS DONE W/DYE SINCE AND HAS NEVER BEEN PRE MEDICATED. PER PT, Onset Date: 16109604   Shellfish Allergy    Dilantin [phenytoin Sodium Extended] Rash   Tegretol [carbamazepine] Rash      Medication List    STOP taking these medications   sotalol 160 MG tablet Commonly known as:  BETAPACE      TAKE these medications   acetaminophen 500 MG tablet Commonly known as:  TYLENOL Take 500 mg by mouth every 6 (six) hours as needed for mild pain.   ALPRAZolam 1 MG tablet Commonly known as:  XANAX Take 0.5 tablets (0.5 mg total) by mouth 2 (two) times daily as needed for anxiety. What changed:  when to take this   EYE DROPS OP Apply 2 drops to eye daily as needed (irritation).       Allergies  Allergen Reactions  . Iohexol      Desc: PT STATES THROAT/FACE SWELLING W/ IVP DYE IN 1990. PT HAS NOT HAD ANY EXAMS DONE W/DYE SINCE AND HAS NEVER BEEN PRE MEDICATED. PER PT, Onset Date: [id number]   . Shellfish Allergy   . Dilantin [Phenytoin Sodium Extended] Rash  . Tegretol [Carbamazepine] Rash    Consultations:  Palliative medicine   Procedures/Studies: Dg Chest 1 View  Result Date: 05/27/2017 CLINICAL DATA:  Low blood pressure. EXAM: CHEST 1 VIEW COMPARISON:  08/19/2016 . FINDINGS: Previously identified PICC line and NG tube normal chest x-ray 08/19/2016 have been removed. Cardiac pacer with lead tip over the right atrium and right ventricle. Heart size stable. Lung volumes with mild bibasilar atelectasis. Previously identified right base infiltrate has largely cleared. No pleural effusion or pneumothorax . IMPRESSION: 1. Cardiac pacer noted with lead tip over the right atrium and right ventricle. Heart size stable. 2. Low lung volumes with mild basilar atelectasis . Previously identified infiltrate right lung base has largely cleared. Electronically Signed   By: Maisie Fus  Register   On: 05/27/2017 16:26   Ct Head Wo Contrast  Result Date: 05/27/2017 CLINICAL DATA:  Hypotension and weakness today. EXAM: CT HEAD WITHOUT CONTRAST TECHNIQUE: Contiguous axial images were obtained from the base of the skull through the vertex without intravenous contrast. COMPARISON:  Head CT scan 06/09/2014. FINDINGS: Brain: There is cortical atrophy and chronic microvascular  ischemic change.  Encephalomalacia right frontal lobe subjacent to a craniotomy defect is unchanged. No evidence of acute abnormality including hemorrhage, infarct, mass lesion, mass effect, midline shift or abnormal extra-axial fluid collection. No hydrocephalus or pneumocephalus. Vascular: No hyperdense vessel or unexpected calcification. Skull: No fracture.  Craniotomy defect noted. Sinuses/Orbits: Extensive mucosal thickening in the maxillary sinuses bilaterally is partially imaged and chronic. Other: None. IMPRESSION: No acute abnormality. Atrophy and chronic microvascular ischemic change. Encephalomalacia right frontal lobe subjacent to a craniotomy defect. Chronic bilateral maxillary sinus disease. Electronically Signed   By: Drusilla Kannerhomas  Dalessio M.D.   On: 05/27/2017 16:35   Dg Chest Port 1 View  Result Date: 06/03/2017 CLINICAL DATA:  PICC placement. EXAM: PORTABLE CHEST 1 VIEW COMPARISON:  Radiograph earlier this day at 1751 hour FINDINGS: Previous left upper extremity PICC is been removed. New right upper extremity PICC with tip in the distal SVC. Unchanged borderline cardiomegaly and mediastinal contours. Atherosclerosis of the thoracic aorta. Hazy opacity in the lung bases and perihilar regions appear similar to prior. No pneumothorax. Left-sided pacemaker remains in place. IMPRESSION: 1. Tip of the right upper extremity PICC in the distal SVC. Previous left arm PICC has been removed. 2. Unchanged hazy lung base and perihilar opacity which may be pleural effusions, atelectasis, pulmonary edema or combination thereof. Electronically Signed   By: Rubye OaksMelanie  Ehinger M.D.   On: 06/03/2017 22:38   Dg Chest Port 1 View  Result Date: 06/03/2017 CLINICAL DATA:  Dyspnea.  PICC line placement. EXAM: PORTABLE CHEST 1 VIEW COMPARISON:  05/27/2017 FINDINGS: Apparent left-sided PICC line curls in the medial left upper arm extending to the left axilla all projecting inferior to the left humeral head. Cardiac silhouette is mildly  enlarged. There is hazy opacity in the perihilar and lung bases consistent with a combination of pleural effusions with either atelectasis or dependent lung base edema or a combination. Left anterior chest wall sequential pacemaker is stable and well positioned. IMPRESSION: Left PICC is curled in the upper left arm. It does not enter the thorax. Electronically Signed   By: Amie Portlandavid  Ormond M.D.   On: 06/03/2017 20:07        Discharge Exam: Vitals:   06/05/17 1115 06/05/17 1130  BP: 91/68 100/67  Pulse: 90 83  Resp: 13 15  Temp:    SpO2: 99% 100%   Vitals:   06/05/17 1045 06/05/17 1100 06/05/17 1115 06/05/17 1130  BP: 92/65 92/68 91/68  100/67  Pulse: 84 91 90 83  Resp: 16 13 13 15   Temp:  97.6 F (36.4 C)    TempSrc:  Oral    SpO2: 99% 99% 99% 100%  Weight:      Height:        General: Pt is alert, awake, not in acute distress Cardiovascular: RRR, S1/S2 +, no rubs, Respiratory: bibasilar rales, no wheeze Abdominal: Soft, NT, ND, bowel sounds + Extremities: 1 + LE edema, no cyanosis   The results of significant diagnostics from this hospitalization (including imaging, microbiology, ancillary and laboratory) are listed below for reference.    Significant Diagnostic Studies: Dg Chest 1 View  Result Date: 05/27/2017 CLINICAL DATA:  Low blood pressure. EXAM: CHEST 1 VIEW COMPARISON:  08/19/2016 . FINDINGS: Previously identified PICC line and NG tube normal chest x-ray 08/19/2016 have been removed. Cardiac pacer with lead tip over the right atrium and right ventricle. Heart size stable. Lung volumes with mild bibasilar atelectasis. Previously identified right base infiltrate has largely cleared. No pleural effusion or  pneumothorax . IMPRESSION: 1. Cardiac pacer noted with lead tip over the right atrium and right ventricle. Heart size stable. 2. Low lung volumes with mild basilar atelectasis . Previously identified infiltrate right lung base has largely cleared. Electronically Signed    By: Maisie Fus  Register   On: 05/27/2017 16:26   Ct Head Wo Contrast  Result Date: 05/27/2017 CLINICAL DATA:  Hypotension and weakness today. EXAM: CT HEAD WITHOUT CONTRAST TECHNIQUE: Contiguous axial images were obtained from the base of the skull through the vertex without intravenous contrast. COMPARISON:  Head CT scan 06/09/2014. FINDINGS: Brain: There is cortical atrophy and chronic microvascular ischemic change. Encephalomalacia right frontal lobe subjacent to a craniotomy defect is unchanged. No evidence of acute abnormality including hemorrhage, infarct, mass lesion, mass effect, midline shift or abnormal extra-axial fluid collection. No hydrocephalus or pneumocephalus. Vascular: No hyperdense vessel or unexpected calcification. Skull: No fracture.  Craniotomy defect noted. Sinuses/Orbits: Extensive mucosal thickening in the maxillary sinuses bilaterally is partially imaged and chronic. Other: None. IMPRESSION: No acute abnormality. Atrophy and chronic microvascular ischemic change. Encephalomalacia right frontal lobe subjacent to a craniotomy defect. Chronic bilateral maxillary sinus disease. Electronically Signed   By: Drusilla Kanner M.D.   On: 05/27/2017 16:35   Dg Chest Port 1 View  Result Date: 06/03/2017 CLINICAL DATA:  PICC placement. EXAM: PORTABLE CHEST 1 VIEW COMPARISON:  Radiograph earlier this day at 1751 hour FINDINGS: Previous left upper extremity PICC is been removed. New right upper extremity PICC with tip in the distal SVC. Unchanged borderline cardiomegaly and mediastinal contours. Atherosclerosis of the thoracic aorta. Hazy opacity in the lung bases and perihilar regions appear similar to prior. No pneumothorax. Left-sided pacemaker remains in place. IMPRESSION: 1. Tip of the right upper extremity PICC in the distal SVC. Previous left arm PICC has been removed. 2. Unchanged hazy lung base and perihilar opacity which may be pleural effusions, atelectasis, pulmonary edema or  combination thereof. Electronically Signed   By: Rubye Oaks M.D.   On: 06/03/2017 22:38   Dg Chest Port 1 View  Result Date: 06/03/2017 CLINICAL DATA:  Dyspnea.  PICC line placement. EXAM: PORTABLE CHEST 1 VIEW COMPARISON:  05/27/2017 FINDINGS: Apparent left-sided PICC line curls in the medial left upper arm extending to the left axilla all projecting inferior to the left humeral head. Cardiac silhouette is mildly enlarged. There is hazy opacity in the perihilar and lung bases consistent with a combination of pleural effusions with either atelectasis or dependent lung base edema or a combination. Left anterior chest wall sequential pacemaker is stable and well positioned. IMPRESSION: Left PICC is curled in the upper left arm. It does not enter the thorax. Electronically Signed   By: Amie Portland M.D.   On: 06/03/2017 20:07     Microbiology: Recent Results (from the past 240 hour(s))  Culture, Urine     Status: Abnormal   Collection Time: 05/27/17  5:29 PM  Result Value Ref Range Status   Specimen Description URINE, CATHETERIZED  Final   Special Requests NONE  Final   Culture MULTIPLE SPECIES PRESENT, SUGGEST RECOLLECTION (A)  Final   Report Status 05/30/2017 FINAL  Final  Blood culture (routine x 2)     Status: None   Collection Time: 05/27/17  8:02 PM  Result Value Ref Range Status   Specimen Description BLOOD RIGHT ARM  Final   Special Requests   Final    BOTTLES DRAWN AEROBIC AND ANAEROBIC Blood Culture results may not be optimal due to  an inadequate volume of blood received in culture bottles   Culture NO GROWTH 5 DAYS  Final   Report Status 06/01/2017 FINAL  Final  Blood culture (routine x 2)     Status: None   Collection Time: 05/27/17  8:06 PM  Result Value Ref Range Status   Specimen Description BLOOD RIGHT HAND  Final   Special Requests   Final    BOTTLES DRAWN AEROBIC AND ANAEROBIC Blood Culture results may not be optimal due to an inadequate volume of blood received  in culture bottles   Culture NO GROWTH 5 DAYS  Final   Report Status 06/01/2017 FINAL  Final  MRSA PCR Screening     Status: Abnormal   Collection Time: 05/27/17 10:51 PM  Result Value Ref Range Status   MRSA by PCR POSITIVE (A) NEGATIVE Final    Comment:        The GeneXpert MRSA Assay (FDA approved for NASAL specimens only), is one component of a comprehensive MRSA colonization surveillance program. It is not intended to diagnose MRSA infection nor to guide or monitor treatment for MRSA infections. RESULT CALLED TO, READ BACK BY AND VERIFIED WITH: ALLEN N. AT 0900 ON [id number] BY THOMPSON S.   Culture, Urine     Status: None   Collection Time: 06/03/17  2:28 PM  Result Value Ref Range Status   Specimen Description URINE, CLEAN CATCH  Final   Special Requests NONE  Final   Culture   Final    NO GROWTH Performed at [facility], [address], [place], [place] [address]    Report Status 06/05/2017 FINAL  Final     Labs: Basic Metabolic Panel: Recent Labs  Lab 05/31/17 0438 06/01/17 0545 06/02/17 0414 06/03/17 0506 06/04/17 0501  NA 125* 129* 126* 127* 128*  K 4.7 4.5 3.7 3.3* 3.9  CL 98* 102 101 100* 102  CO2 21* 21* 19* 18* 19*  GLUCOSE 103* 96 112* 110* 110*  BUN 32* 30* 28* 22* 21*  CREATININE 0.53* 0.50* 0.38* 0.40* 0.43*  CALCIUM 8.0* 8.0* 7.8* 7.8* 7.8*  MG 2.0 1.9 1.6* 1.8 1.7   Liver Function Tests: No results for input(s): AST, ALT, ALKPHOS, BILITOT, PROT, ALBUMIN in the last 168 hours. No results for input(s): LIPASE, AMYLASE in the last 168 hours. No results for input(s): AMMONIA in the last 168 hours. CBC: Recent Labs  Lab 05/31/17 0438 06/01/17 0545 06/02/17 0414 06/03/17 0506 06/04/17 0501  WBC 10.0 8.2 12.9* 10.6* 13.7*  HGB 9.0* 9.6* 8.9* 8.6* 8.1*  HCT 27.9* 29.2* 27.1* 26.6* 24.5*  MCV 83.0 83.0 83.6 83.1 82.8  PLT 115* 82* 137* 115* 124*   Cardiac Enzymes: No results for input(s): CKTOTAL, CKMB, CKMBINDEX, TROPONINI in the  last 168 hours. BNP: Invalid input(s): POCBNP CBG: No results for input(s): GLUCAP in the last 168 hours.  Time coordinating discharge:  Greater than 30 minutes  Signed:  Catarina Hartshorn, DO [facility] Pager: [phone] 06/05/2017, 12:28 PM

## 2017-06-05 NOTE — Progress Notes (Addendum)
During the am med pass, this Clinical research associatewriter entered the patients room with hands full, carrying medications and drinks, placed these items at the computer, exited out of the room to put on isolation gown, it was then noticed the patients son was standing over the computer looking down where the medications and items where placed when I was re-entering into the room. The patients son prior to me exiting the room to retrieve a isolation gown was sitting in a recliner next to his father. This patient had a 0.5mg  xanax in a medicine cup upon my previous entry. When I re-entered the room, the medicine cup was still there but the 0.5mg  xanax was missing from the medicine cup. That was the only oral tablet scheduled to be given this am. I checked everywhere for the medication and questioned the patients son who was standing next to the medications when I entered the room and asked if he seen where the ordered xanax was.  The patients son shook his head and said "I don't know". I made the charge nurse Mysti McDaniel aware and Mike GipAnne Hunt, nursing supervisor aware of the what took place.  Mike GipAnne Hunt nursing supervisor to notify pharmacy, security and social worker of the events.

## 2017-06-05 NOTE — Progress Notes (Signed)
Daily Progress Note   Patient Name: Jon [name]       Date: 06/05/2017 DOB: 21-Nov-1931  Age: 83 y.o. MRN#: [id number] Attending Physician: Catarina Hartshorn, MD Primary Care Physician: Gareth Morgan, MD Admit Date: 05/27/2017  Reason for Consultation/Follow-up: Establishing goals of care, Inpatient hospice referral and Psychosocial/spiritual support  Subjective: Jon [name] is lying quietly in bed.  He looks weak and frail, actively dying.  No family at bedside at this time.  He is able to communicate in simple yes and no answers, and tells me that he has no pain at this time. Conference with nursing staff related to care. Call to [facility] hosp[facility] home for clarification about acceptance.   Conference with DSS social worker related to residential hospice placement, and DNR paperwork. DSS DNR paperwork completed, signed with Dr. Arbutus Leas, faxed to DSS.  Length of Stay: 9  Current Medications: Scheduled Meds:  . ALPRAZolam  0.5 mg Oral BID  . feeding supplement (PRO-STAT SUGAR FREE 64)  30 mL Oral BID  . nutrition supplement (JUVEN)  1 packet Oral BID BM  . nystatin   Topical Daily    Continuous Infusions: . 0.9 % NaCl with KCl 20 mEq / L 75 mL/hr at 06/05/17 1017  . ceFEPime (MAXIPIME) IV Stopped (06/05/17 0914)  . norepinephrine (LEVOPHED) Adult infusion 2 mcg/min (06/04/17 2132)    PRN Meds: acetaminophen **OR** acetaminophen, tetrahydrozoline  Physical Exam  Constitutional: No distress.  Appears frail, actively dying.  Anasarca  HENT:  Head: Atraumatic.  Temporal wasting  Cardiovascular: Normal rate.  Pulmonary/Chest: Effort normal. No respiratory distress.  Abdominal: Soft. He exhibits no distension.  Ostomy  Musculoskeletal: He exhibits edema.  Severe muscle  wasting bilateral lower extremities, bedbound  Neurological:  Known dementia  Skin: Skin is warm and dry.  Wounds as previously noted  Nursing note and vitals reviewed.           Vital Signs: BP 100/67   Pulse 83   Temp 97.6 F (36.4 C) (Oral)   Resp 15   Ht 5\' 6"  (1.676 m)   Wt 62.2 kg (137 lb 2 oz)   SpO2 100%   BMI 22.13 kg/m  SpO2: SpO2: 100 % O2 Device: O2 Device: Not Delivered O2 Flow Rate: O2 Flow Rate (L/min): 2 L/min  Intake/output summary:  Intake/Output Summary (Last 24 hours) at 06/05/2017 1250 Last data filed at 06/04/2017 1700 Gross per 24 hour  Intake 147.75 ml  Output 400 ml  Net -252.25 ml   LBM: Last BM Date: 06/05/17 Baseline Weight: Weight: 61.2 kg (135 lb) Most recent weight: Weight: 62.2 kg (137 lb 2 oz)       Palliative Assessment/Data:    Flowsheet Rows     Most Recent Value  Intake Tab  Referral Department  Hospitalist  Unit at Time of Referral  ICU  Palliative Care Primary Diagnosis  Sepsis/Infectious Disease  Date Notified  05/28/17  Palliative Care Type  Return patient Palliative Care  Reason for referral  Clarify Goals of Care  Date of Admission  05/27/17  Date first seen by Palliative Care  05/28/17  # of days Palliative referral response time  0 Day(s)  # of days IP prior to Palliative referral  1  Clinical Assessment  Palliative Performance Scale Score  20%  Pain Max last 24 hours  Not able to report  Pain Min Last 24 hours  Not able to report  Dyspnea Max Last 24 Hours  Not able to report  Dyspnea Min Last 24 hours  Not able to report  Psychosocial & Spiritual Assessment  Palliative Care Outcomes      Patient Active Problem List   Diagnosis Date Noted  . Dyspnea   . Decubitus ulcer of sacral region, stage 4 (HCC) 06/04/2017  . Severe protein-calorie malnutrition (HCC) 06/03/2017  . Acute metabolic encephalopathy 06/03/2017  . Hypothermia 05/31/2017  . Encounter for hospice care discussion   . Weak 05/27/2017  .  Lethargy 05/27/2017  . Leukocytosis   . N&V (nausea and vomiting)   . Goals of care, counseling/discussion   . DNR (do not resuscitate) discussion   . Palliative care encounter   . Pressure injury of skin 08/19/2016  . Septic shock (HCC) 08/19/2016  . Gastric distention   . Heme positive stool   . UGI bleed 08/18/2016  . Sacral decubitus ulcer, stage IV (HCC) 05-22-202017  . Epistaxis 05/10/2016  . Diarrhea 05/02/2016  . Prolonged QT interval   . PAF (paroxysmal atrial fibrillation) (HCC)   . Tachycardia-bradycardia syndrome (HCC)   . Cardiac pacemaker in situ   . Hematemesis 05/15/2015  . Gastrointestinal hemorrhage 05/15/2015  . Malnutrition of moderate degree (HCC) 07/11/2014  . Perianal infection 07/06/2014  . Decubitus ulcer 07/06/2014  . Sacral ulcer (HCC) 07/06/2014  . Anemia of chronic disease 06/16/2014  . Sepsis (HCC) 06/15/2014  . Lower urinary tract infectious disease 06/15/2014  . Acute kidney injury (HCC) 06/15/2014  . Bilateral hydronephrosis 06/15/2014  . Bladder calculi 06/15/2014  . Rhabdomyolysis 06/15/2014  . Hyponatremia 06/15/2014  . Multiple rib fractures 06/15/2014  . Hypotension 06/15/2014  . S/P placement of cardiac pacemaker 06/15/2014  . Dementia 06/15/2014  . AKI (acute kidney injury) (HCC) 06/15/2014  . Knee pain   . Severe dehydration 06/09/2014  . Hypertension 06/09/2014  . Abnormality of gait 05/03/2013  . Essential and other specified forms of tremor 05/03/2013  . Polyneuropathy in other diseases classified elsewhere (HCC) 05/03/2013  . Atrial fibrillation (HCC) 09/07/2012  . Long term current use of anticoagulant therapy 09/07/2012    Palliative Care Assessment & Plan   Patient Profile: 83 y.o.malewith past medical history of dementia, bedbound status approximately 1 year, essential tremor, dysrhythmia, falls with multiple rib fractures, obstructive sleep apnea, does not use CPAP, history of paroxysmal atrial fibrillation with  permanent  cardiac pacemaker, tachybradycardia syndromeadmitted on 12/11/2018with sepsis, hyponatremia with sodium 114,multiple bedsores.  Assessment: sepsis, possible sourcemultiple bedsores:White blood cells reduced from 31-21 today, core body temperature reduced to 93. Jon [name] seems to be actively dying. His wounds are, in my opinion, insurmountable.12/17, some improvement over the weekend, but declined on 12/16 with low blood pressure requiring vasopressors.  12/20, comfort and dignity at end of life, transition to residential hospice. hyponatremia with sodium 114:Improving with hydration. Likely to return without the benefits of life sustaining IV fluids.12/18 sodium continues to range from 125-129.12/18 today's labs include relatively normal kidney function, sodium of 128, unfortunately albumin remains incredibly low at 1.9.  Recommendations/Plan:  [facility] and dignity at end of life.  Goals of Care and Additional Recommendations:  Limitations on Scope of Treatment: Full Comfort Care  Code Status:    Code Status Orders  (From admission, onward)        Start     Ordered   06/04/17 1045  Do not attempt resuscitation (DNR)  Continuous    Question Answer Comment  In the event of cardiac or respiratory ARREST Do not call a "code blue"   In the event of cardiac or respiratory ARREST Do not perform Intubation, CPR, defibrillation or ACLS   In the event of cardiac or respiratory ARREST Use medication by any route, position, wound care, and other measures to relive pain and suffering. May use oxygen, suction and manual treatment of airway obstruction as needed for comfort.      06/04/17 1044    Code Status History    Date Active Date Inactive Code Status Order ID Comments User Context   05/27/2017 23:36 06/04/2017 10:44 Full Code [id number]  Pearson GrippeKim, James, MD Inpatient   08/22/2016 11:21 08/25/2016 17:32 DNR [id number]   Katheran Aweove, Marca Gadsby A, NP Inpatient   08/18/2016 20:56 08/22/2016 11:21 Full Code [id number]  Meredeth IdeLama, Gagan S, MD Inpatient   05/10/2016 15:56 05/13/2016 18:28 Full Code [id number]  Henderson CloudHernandez Acosta, Estela Y, MD ED   05/02/2016 12:48 05/06/2016 17:29 DNR [id number]  Leroy SeaSingh, Prashant K, MD Inpatient   05/15/2015 16:03 05/18/2015 18:01 Full Code [id number]  Standley BrookingGoodrich, Daniel P, MD ED   07/11/2014 11:59 07/12/2014 19:32 Full Code [id number]  Dalia HeadingJenkins, Mark A, MD Inpatient   07/06/2014 21:30 07/11/2014 11:59 Full Code [id number]  Doree AlbeeNewton, Steven, MD ED   06/15/2014 10:41 06/18/2014 18:56 Full Code [id number]  Standley BrookingGoodrich, Daniel P, MD Inpatient   06/09/2014 16:50 06/11/2014 21:19 Full Code [id number]  Wilson SingerGosrani, Nimish C, MD ED       Prognosis:   < 2 weeks, and hours to days would not be surprising.  [facility]   Care plan was discussed with nursing staff, case management, social work, social work at [employer], and Dr. Arbutus Leasat.  Thank you for allowing the Palliative Medicine Team to assist in the care of this patient.   Time In: 1110 Time Out: 1230 Total Time 80 minutes Prolonged Time Billed  yes       Greater than 50%  of this time was spent counseling and coordinating care related to the above assessment and plan.  Katheran Aweasha A Soffia Doshier, NP  Please contact Palliative Medicine Team phone at [phone] for questions and concerns.

## 2017-06-05 NOTE — Care Management Note (Signed)
Case Management Note  Patient Details  Name: Jon [name] MRN: [id number] Date of Birth: 09-17-1931  If discussed at Long Length of Stay Meetings, dates discussed:  06/05/2017  Additional Comments:  Lacreasha Hinds, [name], RN 06/05/2017, 12:14 PM

## 2017-09-26 ENCOUNTER — Other Ambulatory Visit (HOSPITAL_COMMUNITY)
Admission: AD | Admit: 2017-09-26 | Discharge: 2017-09-26 | Disposition: A | Discharge status: Home / Self Care | Payer: Medicare Other | Source: Ambulatory Visit | Attending: Family Medicine | Admitting: Family Medicine

## 2017-09-26 DIAGNOSIS — L89159 Pressure ulcer of sacral region, unspecified stage: Secondary | ICD-10-CM | POA: Diagnosis present

## 2017-09-26 DIAGNOSIS — N390 Urinary tract infection, site not specified: Secondary | ICD-10-CM | POA: Insufficient documentation

## 2017-09-26 DIAGNOSIS — L89152 Pressure ulcer of sacral region, stage 2: Secondary | ICD-10-CM | POA: Insufficient documentation

## 2017-09-26 LAB — URINALYSIS, ROUTINE W REFLEX MICROSCOPIC
Bacteria, UA: NONE SEEN
Bilirubin Urine: NEGATIVE
GLUCOSE, UA: NEGATIVE mg/dL
Ketones, ur: NEGATIVE mg/dL
NITRITE: NEGATIVE
Protein, ur: 100 mg/dL — AB
SPECIFIC GRAVITY, URINE: 1.012 (ref 1.005–1.030)
Squamous Epithelial / LPF: NONE SEEN
pH: 7 (ref 5.0–8.0)

## 2017-09-28 LAB — AEROBIC CULTURE  (SUPERFICIAL SPECIMEN)

## 2017-09-28 LAB — URINE CULTURE

## 2017-09-28 LAB — AEROBIC CULTURE W GRAM STAIN (SUPERFICIAL SPECIMEN)

## 2017-09-29 LAB — AEROBIC CULTURE W GRAM STAIN (SUPERFICIAL SPECIMEN)

## 2017-09-29 LAB — AEROBIC CULTURE  (SUPERFICIAL SPECIMEN)

## 2017-11-07 ENCOUNTER — Other Ambulatory Visit (HOSPITAL_COMMUNITY)
Admission: RE | Admit: 2017-11-07 | Discharge: 2017-11-07 | Disposition: A | Discharge status: Home / Self Care | Payer: Medicare Other | Source: Other Acute Inpatient Hospital | Attending: Family Medicine | Admitting: Family Medicine

## 2017-11-07 DIAGNOSIS — N390 Urinary tract infection, site not specified: Secondary | ICD-10-CM | POA: Insufficient documentation

## 2017-11-07 LAB — URINALYSIS, ROUTINE W REFLEX MICROSCOPIC
Bilirubin Urine: NEGATIVE
Glucose, UA: NEGATIVE mg/dL
Ketones, ur: NEGATIVE mg/dL
Nitrite: POSITIVE — AB
Protein, ur: 100 mg/dL — AB
RBC / HPF: >50 RBC/hpf — ABNORMAL HIGH (ref 0–5)
Specific Gravity, Urine: 1.011 (ref 1.005–1.030)
WBC, UA: >50 WBC/hpf — ABNORMAL HIGH (ref 0–5)
pH: 7 (ref 5.0–8.0)

## 2017-11-08 LAB — URINE CULTURE

## 2017-12-04 ENCOUNTER — Other Ambulatory Visit (HOSPITAL_COMMUNITY)
Admission: RE | Admit: 2017-12-04 | Discharge: 2017-12-04 | Disposition: A | Discharge status: Home / Self Care | Payer: Medicare Other | Source: Ambulatory Visit | Attending: Family Medicine | Admitting: Family Medicine

## 2017-12-04 DIAGNOSIS — N390 Urinary tract infection, site not specified: Secondary | ICD-10-CM | POA: Diagnosis present

## 2017-12-04 LAB — CBC WITH DIFFERENTIAL/PLATELET
BASOS ABS: 0 10*3/uL (ref 0.0–0.1)
BASOS PCT: 0 %
Eosinophils Absolute: 0.2 10*3/uL (ref 0.0–0.7)
Eosinophils Relative: 3 %
HEMATOCRIT: 40.9 % (ref 39.0–52.0)
Hemoglobin: 13.7 g/dL (ref 13.0–17.0)
Lymphocytes Relative: 19 %
Lymphs Abs: 1.4 10*3/uL (ref 0.7–4.0)
MCH: 26.8 pg (ref 26.0–34.0)
MCHC: 33.5 g/dL (ref 30.0–36.0)
MCV: 80 fL (ref 78.0–100.0)
MONO ABS: 0.5 10*3/uL (ref 0.1–1.0)
Monocytes Relative: 7 %
NEUTROS ABS: 5.1 10*3/uL (ref 1.7–7.7)
Neutrophils Relative %: 71 %
PLATELETS: 261 10*3/uL (ref 150–400)
RBC: 5.11 MIL/uL (ref 4.22–5.81)
RDW: 18.5 % — AB (ref 11.5–15.5)
WBC: 7.2 10*3/uL (ref 4.0–10.5)

## 2017-12-04 LAB — COMPREHENSIVE METABOLIC PANEL
ALBUMIN: 3.7 g/dL (ref 3.5–5.0)
ALT: 11 U/L — AB (ref 17–63)
AST: 16 U/L (ref 15–41)
Alkaline Phosphatase: 83 U/L (ref 38–126)
Anion gap: 9 (ref 5–15)
BILIRUBIN TOTAL: 0.7 mg/dL (ref 0.3–1.2)
BUN: 18 mg/dL (ref 6–20)
CHLORIDE: 92 mmol/L — AB (ref 101–111)
CO2: 25 mmol/L (ref 22–32)
Calcium: 9.1 mg/dL (ref 8.9–10.3)
Creatinine, Ser: 0.75 mg/dL (ref 0.61–1.24)
GFR calc Af Amer: >60 mL/min (ref >60)
GFR calc non Af Amer: >60 mL/min (ref >60)
Glucose, Bld: 98 mg/dL (ref 65–99)
POTASSIUM: 5 mmol/L (ref 3.5–5.1)
Sodium: 126 mmol/L — ABNORMAL LOW (ref 135–145)
Total Protein: 6.8 g/dL (ref 6.5–8.1)

## 2018-01-26 ENCOUNTER — Emergency Department (HOSPITAL_COMMUNITY)
Admission: EM | Admit: 2018-01-26 | Discharge: 2018-01-27 | Disposition: A | Discharge status: Home / Self Care | Payer: Medicare Other | Attending: Emergency Medicine | Admitting: Emergency Medicine

## 2018-01-26 ENCOUNTER — Encounter (HOSPITAL_COMMUNITY): Payer: Self-pay | Admitting: Emergency Medicine

## 2018-01-26 ENCOUNTER — Other Ambulatory Visit: Payer: Self-pay

## 2018-01-26 DIAGNOSIS — Z87891 Personal history of nicotine dependence: Secondary | ICD-10-CM | POA: Insufficient documentation

## 2018-01-26 DIAGNOSIS — Z79899 Other long term (current) drug therapy: Secondary | ICD-10-CM | POA: Insufficient documentation

## 2018-01-26 DIAGNOSIS — L89159 Pressure ulcer of sacral region, unspecified stage: Secondary | ICD-10-CM | POA: Diagnosis present

## 2018-01-26 DIAGNOSIS — I10 Essential (primary) hypertension: Secondary | ICD-10-CM | POA: Insufficient documentation

## 2018-01-26 DIAGNOSIS — Z950 Presence of cardiac pacemaker: Secondary | ICD-10-CM | POA: Diagnosis not present

## 2018-01-26 DIAGNOSIS — B379 Candidiasis, unspecified: Secondary | ICD-10-CM

## 2018-01-26 MED ORDER — AMOXICILLIN-POT CLAVULANATE 500-125 MG PO TABS: 1.0000 | ORAL_TABLET | Freq: Three times a day (TID) | ORAL | 0 refills | Status: DC

## 2018-01-26 NOTE — Discharge Instructions (Signed)
Augmentin as prescribed.  Continue nystatin and Diflucan as previously prescribed.  Follow-up with your primary doctor in the next week, and return to the ER if symptoms significantly worsen or change.

## 2018-01-26 NOTE — ED Provider Notes (Signed)
[facility] EMERGENCY DEPARTMENT Provider Note   CSN: [id number] Arrival date & time: 01/26/18  2152     History   Chief Complaint Chief Complaint  Patient presents with  . Back Pain    HPI Jon [name] is a 84 y.o. male.  Patient is an 84 year old male with past medical history of atrial fibrillation, pacemaker, ileostomy.  He has been bedridden for the past 4 years.  He was sent today by his home health care worker for evaluation of a bedsore.  He has a history of a sacral decubitus ulcer and the nurse was concerned about the appearance.  He also has a rash to his entire back that is believed to be a candidal infection.  It has been treated with Diflucan and nystatin powder.  Patient denies to me he is experiencing any discomfort.  Patient and family deny any fevers.  The history is provided by the patient.    Past Medical History:  Diagnosis Date  . Abnormality of gait   . Arthritis   . B12 deficiency   . Bilateral hydronephrosis   . Bladder calculi   . Bleeding ulcer   . Dysrhythmia   . Essential hypertension   . Essential tremor   . Gastritis   . Macular degeneration of left eye   . Memory loss   . Multiple rib fractures   . Neuropathy   . OSA (obstructive sleep apnea)    Does not use CPAP  . PAF (paroxysmal atrial fibrillation) (HCC)   . Presence of permanent cardiac pacemaker   . Tachycardia-bradycardia syndrome (HCC)    [employer] PPM - Dr. Royann Shivers  . Varicose veins    Bilateral    Patient Active Problem List   Diagnosis Date Noted  . Dyspnea   . Decubitus ulcer of sacral region, stage 4 (HCC) 06/04/2017  . Severe protein-calorie malnutrition (HCC) 06/03/2017  . Acute metabolic encephalopathy 06/03/2017  . Hypothermia 05/31/2017  . Encounter for hospice care discussion   . Weak 05/27/2017  . Lethargy 05/27/2017  . Leukocytosis   . N&V (nausea and vomiting)   . Goals of care, counseling/discussion   . DNR (do not resuscitate) discussion   .  Palliative care encounter   . Pressure injury of skin 08/19/2016  . Septic shock (HCC) 08/19/2016  . Gastric distention   . Heme positive stool   . UGI bleed 08/18/2016  . Sacral decubitus ulcer, stage IV (HCC) 02-10-2016  . Epistaxis 05/10/2016  . Diarrhea 05/02/2016  . Prolonged QT interval   . PAF (paroxysmal atrial fibrillation) (HCC)   . Tachycardia-bradycardia syndrome (HCC)   . Cardiac pacemaker in situ   . Hematemesis 05/15/2015  . Gastrointestinal hemorrhage 05/15/2015  . Malnutrition of moderate degree (HCC) 07/11/2014  . Perianal infection 07/06/2014  . Decubitus ulcer 07/06/2014  . Sacral ulcer (HCC) 07/06/2014  . Anemia of chronic disease 06/16/2014  . Sepsis (HCC) 06/15/2014  . Lower urinary tract infectious disease 06/15/2014  . Acute kidney injury (HCC) 06/15/2014  . Bilateral hydronephrosis 06/15/2014  . Bladder calculi 06/15/2014  . Rhabdomyolysis 06/15/2014  . Hyponatremia 06/15/2014  . Multiple rib fractures 06/15/2014  . Hypotension 06/15/2014  . S/P placement of cardiac pacemaker 06/15/2014  . Dementia 06/15/2014  . AKI (acute kidney injury) (HCC) 06/15/2014  . Knee pain   . Severe dehydration 06/09/2014  . Hypertension 06/09/2014  . Abnormality of gait 05/03/2013  . Essential and other specified forms of tremor 05/03/2013  . Polyneuropathy in other diseases  classified elsewhere (HCC) 05/03/2013  . Atrial fibrillation (HCC) 09/07/2012  . Long term current use of anticoagulant therapy 09/07/2012    Past Surgical History:  Procedure Laterality Date  . BRAIN SURGERY    . CATARACT EXTRACTION Bilateral 2013  . COLON SURGERY    . COLOSTOMY N/A 07/11/2014   Procedure: DIVERTING COLOSTOMY;  Surgeon: Dalia HeadingMark A Jenkins, MD;  Location: [facility];  Service: General;  Laterality: N/A;  wound class: clean contaminated  . ESOPHAGOGASTRODUODENOSCOPY N/A 05/16/2015   Dr. Darrick PennaFields: few linear erosions/ulcerastions in distal esophagus, non-erosive gastritis, no duodenal  abnormalities. Path with chronic gastritis, negative H.pylori.   . INCISION AND DRAINAGE OF WOUND N/A 07/11/2014   Procedure: DEBRIDEMENT OF SACRUM;  Surgeon: Dalia HeadingMark A Jenkins, MD;  Location: [facility] ORS;  Service: General;  Laterality: N/A;  . INSERT / REPLACE / REMOVE PACEMAKER    . MENINGIOMA REMOVAL  1992   POST RT FRONTAL   . PACEMAKER INSERTION  2006  . PERMANENT PACEMAKER GENERATOR CHANGE N/A 08/13/2012   Procedure: PERMANENT PACEMAKER GENERATOR CHANGE;  Surgeon: Thurmon FairMihai Croitoru, MD;  Location: [facility] CATH LAB;  Service: Cardiovascular;  Laterality: N/A;        Home Medications    Prior to Admission medications   Medication Sig Start Date End Date Taking? Authorizing Provider  acetaminophen (TYLENOL) 500 MG tablet Take 500 mg by mouth every 6 (six) hours as needed for mild pain.   Yes [provider]  ALPRAZolam Prudy Feeler(XANAX) 1 MG tablet Take 0.5 tablets (0.5 mg total) by mouth 2 (two) times daily as needed for anxiety. Patient taking differently: Take 0.5 mg by mouth 5 (five) times daily as needed for anxiety.  06/18/14  Yes Hollice EspyKrishnan, Sendil K, MD  fluconazole (DIFLUCAN) 100 MG tablet Take 100 mg by mouth daily. 10 tablets prescribed with 4 tablets remaining for potential yeast on back   Yes [provider]  nystatin (MYCOSTATIN/NYSTOP) powder Apply topically 2 (two) times daily.   Yes [provider]  Tetrahydrozoline HCl (EYE DROPS OP) Apply 2 drops to eye daily as needed (irritation).    [provider]    Family History Family History  Problem Relation Age of Onset  . Aortic aneurysm Mother   . Parkinsonism Mother   . Heart disease Father   . Esophageal cancer Sister   . Colon cancer Neg Hx     Social History Social History   Tobacco Use  . Smoking status: Former Smoker    Packs/day: 0.75    Years: 25.00    Pack years: 18.75    Types: Cigarettes  . Smokeless tobacco: Never Used  . Tobacco comment: QUIT 35 YRS AGO  Substance Use Topics  .  Alcohol use: No  . Drug use: No     Allergies   Iohexol; Shellfish allergy; Dilantin [phenytoin sodium extended]; and Tegretol [carbamazepine]   Review of Systems Review of Systems  All other systems reviewed and are negative.    Physical Exam Updated Vital Signs BP 91/66 (BP Location: Left Arm)   Pulse 96   Temp (!) 97.5 F (36.4 C) (Oral)   Resp 18   Wt 59 kg   SpO2 98%   BMI 20.98 kg/m   Physical Exam  Constitutional: He is oriented to person, place, and time. He appears well-developed and well-nourished. No distress.  HENT:  Head: Normocephalic and atraumatic.  Mouth/Throat: Oropharynx is clear and moist.  Neck: Normal range of motion. Neck supple.  Cardiovascular: Exam reveals no friction  rub.  No murmur heard. Heart is irregularly irregular.  Pulmonary/Chest: Effort normal and breath sounds normal. No respiratory distress. He has no wheezes. He has no rales.  Abdominal: Soft. Bowel sounds are normal. He exhibits no distension. There is no tenderness.  Musculoskeletal: Normal range of motion. He exhibits no edema.  Neurological: He is alert and oriented to person, place, and time. Coordination normal.  Skin: Skin is warm and dry. He is not diaphoretic.  There is a diffuse, scaly rash to the back.  There is also a previous sacral decubitus ulcer that appears to have healed.  There are 2 small areas of breakdown, however there is no significant drainage or erythema.  Nursing note and vitals reviewed.    ED Treatments / Results  Labs (all labs ordered are listed, but only abnormal results are displayed) Labs Reviewed - No data to display  EKG None  Radiology No results found.  Procedures Procedures (including critical care time)  Medications Ordered in ED Medications - No data to display   Initial Impression / Assessment and Plan / ED Course  I have reviewed the triage vital signs and the nursing notes.  Pertinent labs & imaging results that were  available during my care of the patient were reviewed by me and considered in my medical decision making (see chart for details).  The patient's rash appears to be a fungal infection.  I agree with the previous course of therapy of Diflucan and nystatin powder.  The bedsore appears to have healed well.  There are 2 areas of early breakdown noted, however no signs of significant infection.  I have advised turning at regular intervals and will prescribe antibiotics.  Final Clinical Impressions(s) / ED Diagnoses   Final diagnoses:  None    ED Discharge Orders    None       Geoffery Lyonselo, Giomar Gusler, MD 01/26/18 2356

## 2018-01-26 NOTE — ED Triage Notes (Signed)
Per ems home health nurse was at pt house tonight and noticed that he has a pressure sore that's started to open on the lower right side of back and wanted it checked out. Pt c/o of chronic back pain.

## 2018-01-27 NOTE — ED Notes (Signed)
[facility] notified of patient needing transportation home.   

## 2018-04-03 ENCOUNTER — Other Ambulatory Visit (HOSPITAL_COMMUNITY)
Admission: RE | Admit: 2018-04-03 | Discharge: 2018-04-03 | Disposition: A | Discharge status: Home / Self Care | Payer: Medicare Other | Source: Ambulatory Visit | Attending: Family Medicine | Admitting: Family Medicine

## 2018-04-03 DIAGNOSIS — N390 Urinary tract infection, site not specified: Secondary | ICD-10-CM | POA: Diagnosis present

## 2018-04-03 LAB — URINALYSIS, ROUTINE W REFLEX MICROSCOPIC
BILIRUBIN URINE: NEGATIVE
Glucose, UA: NEGATIVE mg/dL
KETONES UR: NEGATIVE mg/dL
Nitrite: NEGATIVE
PH: 7 (ref 5.0–8.0)
PROTEIN: 100 mg/dL — AB
RBC / HPF: >50 RBC/hpf — ABNORMAL HIGH (ref 0–5)
Specific Gravity, Urine: 1.013 (ref 1.005–1.030)

## 2018-04-04 LAB — URINE CULTURE

## 2018-05-29 ENCOUNTER — Other Ambulatory Visit (HOSPITAL_COMMUNITY)
Admission: RE | Admit: 2018-05-29 | Discharge: 2018-05-29 | Disposition: A | Discharge status: Home / Self Care | Payer: Medicare Other | Source: Other Acute Inpatient Hospital | Attending: Family Medicine | Admitting: Family Medicine

## 2018-05-29 DIAGNOSIS — Z79891 Long term (current) use of opiate analgesic: Secondary | ICD-10-CM | POA: Insufficient documentation

## 2018-05-29 DIAGNOSIS — N390 Urinary tract infection, site not specified: Secondary | ICD-10-CM | POA: Diagnosis present

## 2018-05-29 LAB — URINALYSIS, COMPLETE (UACMP) WITH MICROSCOPIC
Bacteria, UA: NONE SEEN
Bilirubin Urine: NEGATIVE
GLUCOSE, UA: NEGATIVE mg/dL
Ketones, ur: NEGATIVE mg/dL
Nitrite: NEGATIVE
Protein, ur: 100 mg/dL — AB
Specific Gravity, Urine: 1.008 (ref 1.005–1.030)
WBC, UA: >50 WBC/hpf — ABNORMAL HIGH (ref 0–5)
pH: 6 (ref 5.0–8.0)

## 2018-05-29 LAB — RAPID URINE DRUG SCREEN, HOSP PERFORMED
Amphetamines: NOT DETECTED
BENZODIAZEPINES: NOT DETECTED
Barbiturates: NOT DETECTED
Cocaine: NOT DETECTED
Opiates: NOT DETECTED
Tetrahydrocannabinol: NOT DETECTED

## 2018-05-30 LAB — URINE CULTURE

## 2018-06-08 ENCOUNTER — Other Ambulatory Visit (HOSPITAL_COMMUNITY)
Admission: AD | Admit: 2018-06-08 | Discharge: 2018-06-08 | Disposition: A | Discharge status: Home / Self Care | Payer: Medicare Other | Source: Skilled Nursing Facility | Attending: Family Medicine | Admitting: Family Medicine

## 2018-06-08 DIAGNOSIS — N390 Urinary tract infection, site not specified: Secondary | ICD-10-CM | POA: Insufficient documentation

## 2018-06-08 DIAGNOSIS — Z79891 Long term (current) use of opiate analgesic: Secondary | ICD-10-CM | POA: Insufficient documentation

## 2018-06-08 LAB — RAPID URINE DRUG SCREEN, HOSP PERFORMED
Amphetamines: NOT DETECTED
BARBITURATES: NOT DETECTED
Benzodiazepines: NOT DETECTED
Cocaine: NOT DETECTED
Opiates: NOT DETECTED
TETRAHYDROCANNABINOL: NOT DETECTED

## 2018-06-08 LAB — URINALYSIS, ROUTINE W REFLEX MICROSCOPIC
BILIRUBIN URINE: NEGATIVE
Bacteria, UA: NONE SEEN
GLUCOSE, UA: NEGATIVE mg/dL
Ketones, ur: NEGATIVE mg/dL
Nitrite: NEGATIVE
PH: 5 (ref 5.0–8.0)
Protein, ur: NEGATIVE mg/dL
SPECIFIC GRAVITY, URINE: 1 — AB (ref 1.005–1.030)

## 2018-06-09 LAB — URINE CULTURE

## 2018-07-17 ENCOUNTER — Other Ambulatory Visit (HOSPITAL_COMMUNITY)
Admission: AD | Admit: 2018-07-17 | Discharge: 2018-07-17 | Disposition: A | Discharge status: Home / Self Care | Payer: Medicare Other | Source: Skilled Nursing Facility | Attending: Family Medicine | Admitting: Family Medicine

## 2018-07-17 DIAGNOSIS — Z79891 Long term (current) use of opiate analgesic: Secondary | ICD-10-CM | POA: Diagnosis present

## 2018-07-17 DIAGNOSIS — N390 Urinary tract infection, site not specified: Secondary | ICD-10-CM | POA: Diagnosis not present

## 2018-07-17 LAB — RAPID URINE DRUG SCREEN, HOSP PERFORMED
AMPHETAMINES: NOT DETECTED
Barbiturates: NOT DETECTED
Benzodiazepines: NOT DETECTED
COCAINE: NOT DETECTED
Opiates: NOT DETECTED
Tetrahydrocannabinol: NOT DETECTED

## 2018-07-17 LAB — URINALYSIS, COMPLETE (UACMP) WITH MICROSCOPIC
Bilirubin Urine: NEGATIVE
Glucose, UA: NEGATIVE mg/dL
KETONES UR: NEGATIVE mg/dL
Nitrite: NEGATIVE
PH: 7 (ref 5.0–8.0)
PROTEIN: 30 mg/dL — AB
Specific Gravity, Urine: 1.011 (ref 1.005–1.030)
WBC, UA: >50 WBC/hpf — ABNORMAL HIGH (ref 0–5)

## 2018-07-19 LAB — URINE CULTURE

## 2018-07-28 ENCOUNTER — Other Ambulatory Visit: Payer: Self-pay

## 2018-07-28 ENCOUNTER — Emergency Department (HOSPITAL_COMMUNITY): Payer: Medicare Other

## 2018-07-28 ENCOUNTER — Encounter (HOSPITAL_COMMUNITY): Payer: Self-pay | Admitting: Emergency Medicine

## 2018-07-28 ENCOUNTER — Inpatient Hospital Stay (HOSPITAL_COMMUNITY)
Admission: EM | Admit: 2018-07-28 | Discharge: 2018-08-02 | DRG: 871 | Disposition: A | Discharge status: Home Health | Payer: Medicare Other | Attending: Internal Medicine | Admitting: Internal Medicine

## 2018-07-28 DIAGNOSIS — N200 Calculus of kidney: Secondary | ICD-10-CM | POA: Diagnosis present

## 2018-07-28 DIAGNOSIS — E860 Dehydration: Secondary | ICD-10-CM | POA: Diagnosis present

## 2018-07-28 DIAGNOSIS — R6521 Severe sepsis with septic shock: Secondary | ICD-10-CM

## 2018-07-28 DIAGNOSIS — Z820 Family history of epilepsy and other diseases of the nervous system: Secondary | ICD-10-CM

## 2018-07-28 DIAGNOSIS — Z87891 Personal history of nicotine dependence: Secondary | ICD-10-CM

## 2018-07-28 DIAGNOSIS — Z8619 Personal history of other infectious and parasitic diseases: Secondary | ICD-10-CM

## 2018-07-28 DIAGNOSIS — Z933 Colostomy status: Secondary | ICD-10-CM

## 2018-07-28 DIAGNOSIS — I495 Sick sinus syndrome: Secondary | ICD-10-CM | POA: Diagnosis present

## 2018-07-28 DIAGNOSIS — L89153 Pressure ulcer of sacral region, stage 3: Secondary | ICD-10-CM | POA: Diagnosis present

## 2018-07-28 DIAGNOSIS — E871 Hypo-osmolality and hyponatremia: Secondary | ICD-10-CM | POA: Diagnosis present

## 2018-07-28 DIAGNOSIS — E43 Unspecified severe protein-calorie malnutrition: Secondary | ICD-10-CM | POA: Diagnosis present

## 2018-07-28 DIAGNOSIS — F444 Conversion disorder with motor symptom or deficit: Secondary | ICD-10-CM | POA: Diagnosis present

## 2018-07-28 DIAGNOSIS — Z800 Family history of malignant neoplasm of digestive organs: Secondary | ICD-10-CM

## 2018-07-28 DIAGNOSIS — Z91013 Allergy to seafood: Secondary | ICD-10-CM

## 2018-07-28 DIAGNOSIS — I4581 Long QT syndrome: Secondary | ICD-10-CM | POA: Diagnosis present

## 2018-07-28 DIAGNOSIS — Z8249 Family history of ischemic heart disease and other diseases of the circulatory system: Secondary | ICD-10-CM

## 2018-07-28 DIAGNOSIS — Z960 Presence of urogenital implants: Secondary | ICD-10-CM | POA: Diagnosis present

## 2018-07-28 DIAGNOSIS — N281 Cyst of kidney, acquired: Secondary | ICD-10-CM | POA: Diagnosis present

## 2018-07-28 DIAGNOSIS — A419 Sepsis, unspecified organism: Secondary | ICD-10-CM | POA: Diagnosis present

## 2018-07-28 DIAGNOSIS — M4628 Osteomyelitis of vertebra, sacral and sacrococcygeal region: Secondary | ICD-10-CM | POA: Diagnosis present

## 2018-07-28 DIAGNOSIS — Z950 Presence of cardiac pacemaker: Secondary | ICD-10-CM | POA: Diagnosis not present

## 2018-07-28 DIAGNOSIS — L89154 Pressure ulcer of sacral region, stage 4: Secondary | ICD-10-CM | POA: Diagnosis not present

## 2018-07-28 DIAGNOSIS — Z681 Body mass index (BMI) 19 or less, adult: Secondary | ICD-10-CM | POA: Diagnosis not present

## 2018-07-28 DIAGNOSIS — N319 Neuromuscular dysfunction of bladder, unspecified: Secondary | ICD-10-CM | POA: Diagnosis present

## 2018-07-28 DIAGNOSIS — Z66 Do not resuscitate: Secondary | ICD-10-CM | POA: Diagnosis present

## 2018-07-28 DIAGNOSIS — Z86011 Personal history of benign neoplasm of the brain: Secondary | ICD-10-CM

## 2018-07-28 DIAGNOSIS — H3530 Unspecified macular degeneration: Secondary | ICD-10-CM | POA: Diagnosis present

## 2018-07-28 DIAGNOSIS — D638 Anemia in other chronic diseases classified elsewhere: Secondary | ICD-10-CM | POA: Diagnosis present

## 2018-07-28 DIAGNOSIS — I10 Essential (primary) hypertension: Secondary | ICD-10-CM | POA: Diagnosis not present

## 2018-07-28 DIAGNOSIS — R1110 Vomiting, unspecified: Secondary | ICD-10-CM

## 2018-07-28 DIAGNOSIS — Z79899 Other long term (current) drug therapy: Secondary | ICD-10-CM

## 2018-07-28 DIAGNOSIS — N5089 Other specified disorders of the male genital organs: Secondary | ICD-10-CM | POA: Diagnosis present

## 2018-07-28 DIAGNOSIS — N492 Inflammatory disorders of scrotum: Secondary | ICD-10-CM | POA: Diagnosis present

## 2018-07-28 DIAGNOSIS — I480 Paroxysmal atrial fibrillation: Secondary | ICD-10-CM | POA: Diagnosis present

## 2018-07-28 DIAGNOSIS — N210 Calculus in bladder: Secondary | ICD-10-CM | POA: Diagnosis not present

## 2018-07-28 DIAGNOSIS — D72829 Elevated white blood cell count, unspecified: Secondary | ICD-10-CM | POA: Diagnosis not present

## 2018-07-28 DIAGNOSIS — Z7401 Bed confinement status: Secondary | ICD-10-CM | POA: Diagnosis not present

## 2018-07-28 DIAGNOSIS — Z888 Allergy status to other drugs, medicaments and biological substances status: Secondary | ICD-10-CM

## 2018-07-28 DIAGNOSIS — Z91041 Radiographic dye allergy status: Secondary | ICD-10-CM

## 2018-07-28 LAB — COMPREHENSIVE METABOLIC PANEL
ALT: 15 U/L (ref 0–44)
AST: 20 U/L (ref 15–41)
Albumin: 2.9 g/dL — ABNORMAL LOW (ref 3.5–5.0)
Alkaline Phosphatase: 92 U/L (ref 38–126)
Anion gap: 10 (ref 5–15)
BUN: 19 mg/dL (ref 8–23)
CO2: 23 mmol/L (ref 22–32)
Calcium: 8.3 mg/dL — ABNORMAL LOW (ref 8.9–10.3)
Chloride: 92 mmol/L — ABNORMAL LOW (ref 98–111)
Creatinine, Ser: 0.68 mg/dL (ref 0.61–1.24)
GFR calc Af Amer: >60 mL/min (ref >60)
GFR calc non Af Amer: >60 mL/min (ref >60)
Glucose, Bld: 107 mg/dL — ABNORMAL HIGH (ref 70–99)
POTASSIUM: 4.8 mmol/L (ref 3.5–5.1)
Sodium: 125 mmol/L — ABNORMAL LOW (ref 135–145)
Total Bilirubin: 0.4 mg/dL (ref 0.3–1.2)
Total Protein: 6.5 g/dL (ref 6.5–8.1)

## 2018-07-28 LAB — CBC WITH DIFFERENTIAL/PLATELET
ABS IMMATURE GRANULOCYTES: 0.1 10*3/uL — AB (ref 0.00–0.07)
Basophils Absolute: 0 10*3/uL (ref 0.0–0.1)
Basophils Relative: 0 %
Eosinophils Absolute: 0.1 10*3/uL (ref 0.0–0.5)
Eosinophils Relative: 1 %
HCT: 36.4 % — ABNORMAL LOW (ref 39.0–52.0)
Hemoglobin: 11.4 g/dL — ABNORMAL LOW (ref 13.0–17.0)
Immature Granulocytes: 1 %
Lymphocytes Relative: 6 %
Lymphs Abs: 0.9 10*3/uL (ref 0.7–4.0)
MCH: 25.2 pg — ABNORMAL LOW (ref 26.0–34.0)
MCHC: 31.3 g/dL (ref 30.0–36.0)
MCV: 80.5 fL (ref 80.0–100.0)
Monocytes Absolute: 0.7 10*3/uL (ref 0.1–1.0)
Monocytes Relative: 4 %
NEUTROS ABS: 13.6 10*3/uL — AB (ref 1.7–7.7)
NEUTROS PCT: 88 %
Platelets: 333 10*3/uL (ref 150–400)
RBC: 4.52 MIL/uL (ref 4.22–5.81)
RDW: 17.9 % — ABNORMAL HIGH (ref 11.5–15.5)
WBC: 15.4 10*3/uL — ABNORMAL HIGH (ref 4.0–10.5)
nRBC: 0 % (ref 0.0–0.2)

## 2018-07-28 LAB — URINALYSIS, ROUTINE W REFLEX MICROSCOPIC
Bilirubin Urine: NEGATIVE
Glucose, UA: NEGATIVE mg/dL
Ketones, ur: NEGATIVE mg/dL
Nitrite: NEGATIVE
Protein, ur: NEGATIVE mg/dL
Specific Gravity, Urine: 1.013 (ref 1.005–1.030)
WBC, UA: >50 WBC/hpf — ABNORMAL HIGH (ref 0–5)
pH: 8 (ref 5.0–8.0)

## 2018-07-28 LAB — LACTIC ACID, PLASMA
LACTIC ACID, VENOUS: 2.1 mmol/L — AB (ref 0.5–1.9)
Lactic Acid, Venous: 2.6 mmol/L (ref 0.5–1.9)
Lactic Acid, Venous: 2.8 mmol/L (ref 0.5–1.9)
Lactic Acid, Venous: 2.8 mmol/L (ref 0.5–1.9)

## 2018-07-28 MED ORDER — SODIUM CHLORIDE 0.9 % IV BOLUS (SEPSIS)
1000.0000 mL | Freq: Once | INTRAVENOUS | Status: AC
  Administered 2018-07-28: 1000 mL via INTRAVENOUS

## 2018-07-28 MED ORDER — HYDROCODONE-ACETAMINOPHEN 5-325 MG PO TABS
1.0000 | ORAL_TABLET | Freq: Four times a day (QID) | ORAL | Status: DC | PRN
  Administered 2018-07-29 – 2018-08-02 (×6): 1 via ORAL
  Filled 2018-07-28 (×6): qty 1

## 2018-07-28 MED ORDER — ACETAMINOPHEN 650 MG RE SUPP: 650.0000 mg | Freq: Four times a day (QID) | RECTAL | Status: DC | PRN

## 2018-07-28 MED ORDER — MORPHINE SULFATE (PF) 4 MG/ML IV SOLN
4.0000 mg | Freq: Once | INTRAVENOUS | Status: AC
  Administered 2018-07-28: 4 mg via INTRAVENOUS
  Filled 2018-07-28: qty 1

## 2018-07-28 MED ORDER — ACETAMINOPHEN 325 MG PO TABS: 650.0000 mg | ORAL_TABLET | Freq: Four times a day (QID) | ORAL | Status: DC | PRN

## 2018-07-28 MED ORDER — VANCOMYCIN HCL IN DEXTROSE 1-5 GM/200ML-% IV SOLN
1000.0000 mg | Freq: Once | INTRAVENOUS | Status: AC
  Administered 2018-07-28: 1000 mg via INTRAVENOUS
  Filled 2018-07-28: qty 200

## 2018-07-28 MED ORDER — PROCHLORPERAZINE EDISYLATE 10 MG/2ML IJ SOLN: 10.0000 mg | Freq: Four times a day (QID) | INTRAMUSCULAR | Status: DC | PRN

## 2018-07-28 MED ORDER — SODIUM CHLORIDE 0.9 % IV SOLN
INTRAVENOUS | Status: DC
  Administered 2018-07-29 – 2018-08-02 (×2): via INTRAVENOUS

## 2018-07-28 MED ORDER — SODIUM CHLORIDE 0.9 % IV SOLN
2.0000 g | Freq: Once | INTRAVENOUS | Status: AC
  Administered 2018-07-28: 2 g via INTRAVENOUS
  Filled 2018-07-28: qty 2

## 2018-07-28 MED ORDER — LACTATED RINGERS IV BOLUS
500.0000 mL | Freq: Once | INTRAVENOUS | Status: AC
  Administered 2018-07-28: 500 mL via INTRAVENOUS

## 2018-07-28 MED ORDER — SODIUM CHLORIDE 0.9 % IV BOLUS
1000.0000 mL | Freq: Once | INTRAVENOUS | Status: AC
  Administered 2018-07-28: 1000 mL via INTRAVENOUS

## 2018-07-28 MED ORDER — HYDROMORPHONE HCL 1 MG/ML IJ SOLN
0.5000 mg | INTRAMUSCULAR | Status: DC | PRN
  Administered 2018-07-28 – 2018-07-31 (×4): 0.5 mg via INTRAVENOUS
  Filled 2018-07-28 (×4): qty 0.5

## 2018-07-28 MED ORDER — SODIUM CHLORIDE 0.9 % IV SOLN
2.0000 g | Freq: Two times a day (BID) | INTRAVENOUS | Status: DC
  Administered 2018-07-29: 2 g via INTRAVENOUS
  Filled 2018-07-28 (×2): qty 2

## 2018-07-28 MED ORDER — VANCOMYCIN HCL IN DEXTROSE 1-5 GM/200ML-% IV SOLN
1000.0000 mg | INTRAVENOUS | Status: DC
  Administered 2018-07-29 – 2018-07-31 (×3): 1000 mg via INTRAVENOUS
  Filled 2018-07-28 (×3): qty 200

## 2018-07-28 MED ORDER — METRONIDAZOLE IN NACL 5-0.79 MG/ML-% IV SOLN
500.0000 mg | Freq: Three times a day (TID) | INTRAVENOUS | Status: DC
  Administered 2018-07-28 – 2018-07-31 (×10): 500 mg via INTRAVENOUS
  Filled 2018-07-28 (×10): qty 100

## 2018-07-28 MED ORDER — HEPARIN SODIUM (PORCINE) 5000 UNIT/ML IJ SOLN
5000.0000 [IU] | Freq: Three times a day (TID) | INTRAMUSCULAR | Status: DC
  Administered 2018-07-28 – 2018-08-02 (×15): 5000 [IU] via SUBCUTANEOUS
  Filled 2018-07-28 (×16): qty 1

## 2018-07-28 NOTE — ED Triage Notes (Signed)
Pt from home.  Pt's son called EMS for abscess to pt's scrotum. Pt had a foley placed and per family, pt's area has been swollen, draining and warm to touch since 1/31.

## 2018-07-28 NOTE — H&P (Signed)
History and Physical  Jon CutterWilliam P [name] UJW:[id number] DOB: [birth year] DOA: 07/28/2018  Referring physician: Shary KeyNavarati MD  PCP: Gareth MorganKnowlton, Steve, MD   Chief Complaint: leaking scrotal abscess  Pt coming from: Home   HPI: Jon [name] is a 85 y.o. male former smoker with a complex past medical history and who had previously been in residential hospice but now living at home with son was brought into the emergency department by EMS from his home when he reports that his chronic scrotal abscess started spontaneously draining.  He had been dealing with this scrotal abscess for several weeks.  He has been followed by his outpatient providers and had been taking antibiotics for this.  He also has been bedbound for the past several years.  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  He has severe protein calorie malnutrition.  He is chronically dehydrated.  He has paroxysmal atrial fibrillation but is not anticoagulated due to history of severe bleeding.  He has a cardiac pacemaker in place.  He has had multiple admissions for sepsis and hypotension.  ED Course: Patient arrived afebrile, heart rate 110, blood pressure 95-115/58-74.  Blood cultures were obtained.  His lactic acid was elevated at 2.1.  His pulse ox was 93% on room air.  Sepsis protocol was initiated and patient was bolused IV fluids.  Patient was started on broad-spectrum IV antibiotics.  Urologist Dr. Berneice HeinrichManny was consulted who will [facility] for further management of his urological issue.  His WBC was elevated at 15.4.  Hemoglobin 11.4.  Platelet count 333.  Sodium level 125.  Potassium 4.8.  Glucose 107.  Calcium 8.3.  Albumin 2.9.  CT scan of the abdomen and pelvis was obtained which was able to rule out necrotizing infection.  [facility] for sepsis, scrotal abscess and urological consultation.  Review of Systems: All systems reviewed  and apart from history of presenting illness, are negative.  Past Medical History:  Diagnosis Date  . Abnormality of gait   . Arthritis   . B12 deficiency   . Bilateral hydronephrosis   . Bladder calculi   . Bleeding ulcer   . Dysrhythmia   . Essential hypertension   . Essential tremor   . Gastritis   . Macular degeneration of left eye   . Memory loss   . Multiple rib fractures   . Neuropathy   . OSA (obstructive sleep apnea)    Does not use CPAP  . PAF (paroxysmal atrial fibrillation) (HCC)   . Presence of permanent cardiac pacemaker   . Tachycardia-bradycardia syndrome (HCC)    [employer] PPM - Dr. Royann Shiversroitoru  . Varicose veins    Bilateral   Past Surgical History:  Procedure Laterality Date  . BRAIN SURGERY    . CATARACT EXTRACTION Bilateral 2013  . COLON SURGERY    . COLOSTOMY N/A 07/11/2014   Procedure: DIVERTING COLOSTOMY;  Surgeon: Dalia HeadingMark A Jenkins, MD;  Location: [facility];  Service: General;  Laterality: N/A;  wound class: clean contaminated  . ESOPHAGOGASTRODUODENOSCOPY N/A 05/16/2015   Dr. Darrick PennaFields: few linear erosions/ulcerastions in distal esophagus, non-erosive gastritis, no duodenal abnormalities. Path with chronic gastritis, negative H.pylori.   . INCISION AND DRAINAGE OF WOUND N/A 07/11/2014   Procedure: DEBRIDEMENT OF SACRUM;  Surgeon: Dalia HeadingMark A Jenkins, MD;  Location: [facility];  Service: General;  Laterality: N/A;  . INSERT / REPLACE / REMOVE PACEMAKER    .  MENINGIOMA REMOVAL  1992   POST RT FRONTAL   . PACEMAKER INSERTION  2006  . PERMANENT PACEMAKER GENERATOR CHANGE N/A 08/13/2012   Procedure: PERMANENT PACEMAKER GENERATOR CHANGE;  Surgeon: Thurmon Fair, MD;  Location: [facility] CATH LAB;  Service: Cardiovascular;  Laterality: N/A;   Social History:  reports that he has quit smoking. His smoking use included cigarettes. He has a 18.75 pack-year smoking history. He has never used smokeless tobacco. He reports that he does not drink alcohol or use drugs.  Allergies    Allergen Reactions  . Iohexol      Desc: PT STATES THROAT/FACE SWELLING W/ IVP DYE IN 1990. PT HAS NOT HAD ANY EXAMS DONE W/DYE SINCE AND HAS NEVER BEEN PRE MEDICATED. PER PT, Onset Date: [id number]   . Shellfish Allergy   . Dilantin [Phenytoin Sodium Extended] Rash  . Tegretol [Carbamazepine] Rash    Family History  Problem Relation Age of Onset  . Aortic aneurysm Mother   . Parkinsonism Mother   . Heart disease Father   . Esophageal cancer Sister   . Colon cancer Neg Hx     Prior to Admission medications   Medication Sig Start Date End Date Taking? Authorizing Provider  acetaminophen (TYLENOL) 500 MG tablet Take 500 mg by mouth every 6 (six) hours as needed for mild pain.    [provider]  ALPRAZolam Prudy Feeler) 1 MG tablet Take 0.5 tablets (0.5 mg total) by mouth 2 (two) times daily as needed for anxiety. Patient taking differently: Take 0.5 mg by mouth 5 (five) times daily as needed for anxiety.  06/18/14   Hollice Espy, MD  amoxicillin-clavulanate (AUGMENTIN) 500-125 MG tablet Take 1 tablet (500 mg total) by mouth every 8 (eight) hours. 01/26/18   Geoffery Lyons, MD  fluconazole (DIFLUCAN) 100 MG tablet Take 100 mg by mouth daily. 10 tablets prescribed with 4 tablets remaining for potential yeast on back    [provider]  nystatin (MYCOSTATIN/NYSTOP) powder Apply topically 2 (two) times daily.    [provider]  Tetrahydrozoline HCl (EYE DROPS OP) Apply 2 drops to eye daily as needed (irritation).    [provider]   Physical Exam: Vitals:   07/28/18 1200 07/28/18 1230 07/28/18 1245 07/28/18 1300  BP: 104/74 98/73  113/88  Pulse:      Resp: 17 20 19  (!) 30  Temp:      TempSrc:      SpO2:      Weight:      Height:        General exam: Emaciated, chronically ill-appearing male with long beard, lying comfortably supine on the gurney in no obvious distress.  Head, eyes and ENT: Nontraumatic and normocephalic. Pupils equally reacting  to light and accommodation. Oral mucosa very dry.  Neck: Supple. No JVD, carotid bruit or thyromegaly.  Lymphatics: No lymphadenopathy.  Respiratory system: Clear to auscultation. No increased work of breathing.  Cardiovascular system: S1 and S2 heard, tachycardic, irregularly irregular. No JVD, murmurs, gallops, clicks or pedal edema.  Gastrointestinal system: Abdomen is nondistended, soft and nontender. Normal bowel sounds heard. No organomegaly or masses appreciated.  GU: large scrotal abscess/infection with signs of drainage. Foley in place.   Central nervous system: Alert and oriented. No focal neurological deficits.  Extremities: Symmetric 5 x 5 power. Peripheral pulses symmetrically felt.   Skin: No rashes or acute findings.  Musculoskeletal system: Negative exam.  Psychiatry: Pleasant and cooperative.  Labs on Admission:  Basic Metabolic Panel: Recent  Labs  Lab 07/28/18 1150  NA 125*  K 4.8  CL 92*  CO2 23  GLUCOSE 107*  BUN 19  CREATININE 0.68  CALCIUM 8.3*   Liver Function Tests: Recent Labs  Lab 07/28/18 1150  AST 20  ALT 15  ALKPHOS 92  BILITOT 0.4  PROT 6.5  ALBUMIN 2.9*   No results for input(s): LIPASE, AMYLASE in the last 168 hours. No results for input(s): AMMONIA in the last 168 hours. CBC: Recent Labs  Lab 07/28/18 1150  WBC 15.4*  NEUTROABS 13.6*  HGB 11.4*  HCT 36.4*  MCV 80.5  PLT 333   Cardiac Enzymes: No results for input(s): CKTOTAL, CKMB, CKMBINDEX, TROPONINI in the last 168 hours.  BNP (last 3 results) No results for input(s): PROBNP in the last 8760 hours. CBG: No results for input(s): GLUCAP in the last 168 hours.  Radiological Exams on Admission: No results found.  EKG: Independently reviewed.  Atrial fibrillation  Assessment/Plan Principal Problem:   Scrotal abscess Active Problems:   Hyponatremia   Anemia, chronic disease   PAF (paroxysmal atrial fibrillation) (HCC)   Tachycardia-bradycardia syndrome  (HCC)   Cardiac pacemaker in situ   Sacral decubitus ulcer, stage IV (HCC)   Sepsis    Leukocytosis  1. Scrotal cellulitis/abscess-[facility].  I discussed with Dr. Berneice HeinrichManny the urologist on-call who agreed to see the patient in [facility].  He recommended continuing broad-spectrum IV antibiotics.  Blood cultures have been obtained will follow. 2. Sepsis secondary to scrotal infection and cellulitis-treating aggressively with sepsis bundle.  IV fluid boluses ordered.  Follow closely.  Trend lactic acid. 3. Hyponatremia-suspect secondary to dehydration.  Treating with normal saline infusion and plan to follow BMP. 4. Severe protein calorie malnutrition- dietitian consultation. 5. Stage IV sacral decubitus ulcers-CT scan is suggesting possible osteomyelitis.  Evaluate further when clinically stabilized. 6. Leukocytosis-secondary to above infections-follow WBC and trend. 7. Anemia of chronic disease- follow hemoglobin closely, suspect that hemoglobin will drop from hemodilution with IV fluids.  Transfuse as needed. 8. Tachycardia-bradycardia syndrome-patient has indwelling pacemaker. 9. Paroxysmal atrial fibrillation- he is not anticoagulated now because of severe bleeding in the past. 10. DNR -patient has had multiple palliative discussions and had been sent to residential hospice in December 2018 however he is now home with his son who I have serious concerns about his ability to care for him properly.  DVT Prophylaxis: subcut heparin Code Status: DNR   Family Communication: son at bedside  [facility]   Critical careTime spent: 58  mins  Standley Dakinslanford Zayden Hahne, MD [facility] How to contact the [facility] Attending or Consulting provider 7A - 7P or covering provider during after hours 7P -7A, for this patient?  1. Check the care team in [facility] and look for a) attending/consulting [facility]  provider listed and b) the [facility] team listed 2. Log into [email or web address] and use [place]'s universal password to access. If you do not have the password, please contact the hospital operator. 3. Locate the [facility] provider you are looking for under [facility] and page to a number that you can be directly reached. 4. If you still have difficulty reaching the provider, please page the [facility] (Director on Call) for the Hospitalists listed on amion for assistance.

## 2018-07-28 NOTE — ED Provider Notes (Signed)
[facility] EMERGENCY DEPARTMENT Provider Note   CSN: [id number] Arrival date & time: 07/28/18  1050     History   Chief Complaint Chief Complaint  Patient presents with  . Abscess    HPI Jon [name] is a 85 y.o. male.  HPI  85 year old male comes in with chief complaint of abscess. Patient has history of paroxysmal A. fib not on any anticoagulation.  He is also has history of hypertension and is bedbound because of complications from his hip surgery.  Patient also has known pressure ulcers.  According to the patient he has had scrotal swelling for the last several weeks.  He has chronic indwelling Foley catheter and his home health team has been observing him and the doctor has been giving him antibiotics.  Currently he is not on any antibiotics.  He states that his scrotum had swollen significantly and today he just started oozing from the site.  Review of system is negative for any nausea, vomiting, fevers, chills, diaphoresis.  Patient does not have a urologist at this time.  Past Medical History:  Diagnosis Date  . Abnormality of gait   . Arthritis   . B12 deficiency   . Bilateral hydronephrosis   . Bladder calculi   . Bleeding ulcer   . Dysrhythmia   . Essential hypertension   . Essential tremor   . Gastritis   . Macular degeneration of left eye   . Memory loss   . Multiple rib fractures   . Neuropathy   . OSA (obstructive sleep apnea)    Does not use CPAP  . PAF (paroxysmal atrial fibrillation) (HCC)   . Presence of permanent cardiac pacemaker   . Tachycardia-bradycardia syndrome (HCC)    [employer] PPM - Dr. Royann Shivers  . Varicose veins    Bilateral    Patient Active Problem List   Diagnosis Date Noted  . Scrotal abscess 07/28/2018  . Dyspnea   . Decubitus ulcer of sacral region, stage 4 (HCC) 06/04/2017  . Severe protein-calorie malnutrition (HCC) 06/03/2017  . Acute metabolic encephalopathy 06/03/2017  . Hypothermia 05/31/2017  . Encounter  for hospice care discussion   . Weak 05/27/2017  . Lethargy 05/27/2017  . Leukocytosis   . N&V (nausea and vomiting)   . Goals of care, counseling/discussion   . DNR (do not resuscitate) discussion   . Palliative care encounter   . Pressure injury of skin 08/19/2016  . Sepsis  08/19/2016  . Gastric distention   . Heme positive stool   . UGI bleed 08/18/2016  . Sacral decubitus ulcer, stage IV (HCC) 12-29-2015  . Epistaxis 05/10/2016  . Diarrhea 05/02/2016  . Prolonged QT interval   . PAF (paroxysmal atrial fibrillation) (HCC)   . Tachycardia-bradycardia syndrome (HCC)   . Cardiac pacemaker in situ   . Hematemesis 05/15/2015  . Gastrointestinal hemorrhage 05/15/2015  . Malnutrition of moderate degree (HCC) 07/11/2014  . Perianal infection 07/06/2014  . Decubitus ulcer 07/06/2014  . Sacral ulcer (HCC) 07/06/2014  . Anemia, chronic disease 06/16/2014  . Sepsis (HCC) 06/15/2014  . Lower urinary tract infectious disease 06/15/2014  . Acute kidney injury (HCC) 06/15/2014  . Bilateral hydronephrosis 06/15/2014  . Bladder calculi 06/15/2014  . Rhabdomyolysis 06/15/2014  . Hyponatremia 06/15/2014  . Multiple rib fractures 06/15/2014  . Hypotension 06/15/2014  . S/P placement of cardiac pacemaker 06/15/2014  . Dementia (HCC) 06/15/2014  . AKI (acute kidney injury) (HCC) 06/15/2014  . Knee pain   . Severe dehydration 06/09/2014  .  Hypertension 06/09/2014  . Abnormality of gait 05/03/2013  . Essential and other specified forms of tremor 05/03/2013  . Polyneuropathy in other diseases classified elsewhere (HCC) 05/03/2013  . Atrial fibrillation (HCC) 09/07/2012  . Long term current use of anticoagulant therapy 09/07/2012    Past Surgical History:  Procedure Laterality Date  . BRAIN SURGERY    . CATARACT EXTRACTION Bilateral 2013  . COLON SURGERY    . COLOSTOMY N/A 07/11/2014   Procedure: DIVERTING COLOSTOMY;  Surgeon: Dalia Heading, MD;  Location: [facility];  Service: General;   Laterality: N/A;  wound class: clean contaminated  . ESOPHAGOGASTRODUODENOSCOPY N/A 05/16/2015   Dr. Darrick Penna: few linear erosions/ulcerastions in distal esophagus, non-erosive gastritis, no duodenal abnormalities. Path with chronic gastritis, negative H.pylori.   . INCISION AND DRAINAGE OF WOUND N/A 07/11/2014   Procedure: DEBRIDEMENT OF SACRUM;  Surgeon: Dalia Heading, MD;  Location: [facility];  Service: General;  Laterality: N/A;  . INSERT / REPLACE / REMOVE PACEMAKER    . MENINGIOMA REMOVAL  1992   POST RT FRONTAL   . PACEMAKER INSERTION  2006  . PERMANENT PACEMAKER GENERATOR CHANGE N/A 08/13/2012   Procedure: PERMANENT PACEMAKER GENERATOR CHANGE;  Surgeon: Thurmon Fair, MD;  Location: [facility]ATH [facility];  Service: Cardiovascular;  Laterality: N/A;        Home Medications    Prior to Admission medications   Medication Sig Start Date End Date Taking? Authorizing Provider  acetaminophen (TYLENOL) 500 MG tablet Take 500 mg by mouth every 6 (six) hours as needed for mild pain.   Yes [provider]  ALPRAZolam Prudy Feeler) 1 MG tablet Take 0.5 tablets (0.5 mg total) by mouth 2 (two) times daily as needed for anxiety. Patient taking differently: Take 0.5 mg by mouth 3 (three) times daily as needed for anxiety.  06/18/14  Yes Hollice Espy, MD  Tetrahydrozoline HCl (EYE DROPS OP) Apply 2 drops to eye daily as needed (irritation).   Yes [provider]  amoxicillin-clavulanate (AUGMENTIN) 500-125 MG tablet Take 1 tablet (500 mg total) by mouth every 8 (eight) hours. Patient not taking: Reported on 07/28/2018 01/26/18   Geoffery Lyons, MD  fluconazole (DIFLUCAN) 100 MG tablet Take 100 mg by mouth daily. 10 tablets prescribed with 4 tablets remaining for potential yeast on back    [provider]  nystatin (MYCOSTATIN/NYSTOP) powder Apply topically 2 (two) times daily.    [provider]    Family History Family History  Problem Relation Age of Onset  . Aortic aneurysm  Mother   . Parkinsonism Mother   . Heart disease Father   . Esophageal cancer Sister   . Colon cancer Neg Hx     Social History Social History   Tobacco Use  . Smoking status: Former Smoker    Packs/day: 0.75    Years: 25.00    Pack years: 18.75    Types: Cigarettes  . Smokeless tobacco: Never Used  . Tobacco comment: QUIT 35 YRS AGO  Substance Use Topics  . Alcohol use: No  . Drug use: No     Allergies   Iohexol; Shellfish allergy; Dilantin [phenytoin sodium extended]; and Tegretol [carbamazepine]   Review of Systems Review of Systems   Physical Exam Updated Vital Signs BP 108/73   Pulse (!) 103   Temp 98.6 F (37 C) (Oral)   Resp 17   Ht 5\' 8"  (1.727 m)   Wt 59 kg   SpO2 93%   BMI 19.77 kg/m  Physical Exam Vitals signs and nursing note reviewed.  Constitutional:      Appearance: He is well-developed.  HENT:     Head: Atraumatic.  Neck:     Musculoskeletal: Neck supple.  Cardiovascular:     Rate and Rhythm: Normal rate.  Pulmonary:     Effort: Pulmonary effort is normal.  Genitourinary:    Comments: Patient has indurated and erythematous scrotum with an ulcer at the anterior aspect that is draining yellow fluid.  There is no bleeding.  Patient has no suprapubic tenderness.  Foley catheter in place. Skin:    General: Skin is warm.  Neurological:     Mental Status: He is alert and oriented to person, place, and time.      ED Treatments / Results  Labs (all labs ordered are listed, but only abnormal results are displayed) Labs Reviewed  LACTIC ACID, PLASMA - Abnormal; Notable for the following components:      Result Value   Lactic Acid, Venous 2.1 (*)    All other components within normal limits  LACTIC ACID, PLASMA - Abnormal; Notable for the following components:   Lactic Acid, Venous 2.6 (*)    All other components within normal limits  COMPREHENSIVE METABOLIC PANEL - Abnormal; Notable for the following components:   Sodium 125 (*)     Chloride 92 (*)    Glucose, Bld 107 (*)    Calcium 8.3 (*)    Albumin 2.9 (*)    All other components within normal limits  CBC WITH DIFFERENTIAL/PLATELET - Abnormal; Notable for the following components:   WBC 15.4 (*)    Hemoglobin 11.4 (*)    HCT 36.4 (*)    MCH 25.2 (*)    RDW 17.9 (*)    Neutro Abs 13.6 (*)    Abs Immature Granulocytes 0.10 (*)    All other components within normal limits  URINALYSIS, ROUTINE W REFLEX MICROSCOPIC - Abnormal; Notable for the following components:   APPearance CLOUDY (*)    Hgb urine dipstick SMALL (*)    Leukocytes,Ua LARGE (*)    WBC, UA >50 (*)    Bacteria, UA FEW (*)    All other components within normal limits  LACTIC ACID, PLASMA - Abnormal; Notable for the following components:   Lactic Acid, Venous 2.8 (*)    All other components within normal limits  AEROBIC CULTURE (SUPERFICIAL SPECIMEN)  CULTURE, BLOOD (ROUTINE X 2)  CULTURE, BLOOD (ROUTINE X 2)  LACTIC ACID, PLASMA  LACTIC ACID, PLASMA  LACTIC ACID, PLASMA    EKG EKG Interpretation  Date/Time:  Tuesday July 28 2018 11:46:36 EST Ventricular Rate:  100 PR Interval:    QRS Duration: 74 QT Interval:  384 QTC Calculation: 496 R Axis:   35 Text Interpretation:  Atrial fibrillation Low voltage, precordial leads Nonspecific T abnrm, anterolateral leads Borderline prolonged QT interval No acute changes Confirmed by Derwood Kaplan [id number]) on 07/28/2018 1:12:29 PM   Radiology Ct Abdomen Pelvis Wo Contrast  Result Date: 07/28/2018 CLINICAL DATA:  Scrotal abscess, swelling, pain, drainage EXAM: CT ABDOMEN AND PELVIS WITHOUT CONTRAST TECHNIQUE: Multidetector CT imaging of the abdomen and pelvis was performed following the standard protocol without IV contrast. COMPARISON:  CT abdomen pelvis, 08/18/2016 FINDINGS: Lower chest: Small right pleural effusion. Bibasilar scarring or atelectasis. Hepatobiliary: No focal liver abnormality is seen. Gallstones. No wall thickening, or  biliary dilatation. Pancreas: Unremarkable. No pancreatic ductal dilatation or surrounding inflammatory changes. Spleen: Normal in size without focal abnormality. Adrenals/Urinary Tract:  Adrenal glands are unremarkable. There is a redemonstrated, extremely large exophytic cyst of the right kidney measuring at least 24 cm, slightly increased in size compared to prior examination (series 6, image 39). There are multiple right renal calculi including a staghorn calculus of the right renal pelvis. Numerous bladder calculi in the dependent urinary bladder. Foley catheter and multiple diverticula of the thickened bladder. Prostatomegaly. Stomach/Bowel: Stomach is within normal limits. No evidence of bowel wall thickening, distention, or inflammatory changes. Status post sigmoid resection and left lower quadrant ostomy. Vascular/Lymphatic: Aortic atherosclerosis. No enlarged abdominal or pelvic lymph nodes. Reproductive: No mass or other abnormality. Other: No abdominal wall hernia or abnormality. No abdominopelvic ascites. Scrotal edema without discrete fluid collection appreciated. Musculoskeletal: Sacral and right ischial decubitus ulcers with underlying osseous sclerosis (series 3, image 60) concerning for osteomyelitis. IMPRESSION: 1. Scrotal edema without discrete fluid collection appreciated. 2. Sacral and right ischial decubitus ulcers with underlying osseous sclerosis (series 3, image 60) concerning for osteomyelitis. 3. Multiple chronic and incidental findings of the urinary tract: There is a redemonstrated, extremely large exophytic cyst of the right kidney measuring at least 24 cm, slightly increased in size compared to prior examination (series 6, image 39). There are multiple right renal calculi including a staghorn calculus of the right renal pelvis. Numerous bladder calculi in the dependent urinary bladder. Foley catheter and multiple diverticula of the thickened bladder. Prostatomegaly. 4.  Status post  sigmoid resection and left lower quadrant ostomy. 5.  Other chronic and incidental findings as detailed above. Electronically Signed   By: Lauralyn PrimesAlex  Bibbey M.D.   On: 07/28/2018 13:40    Procedures .Critical Care Performed by: Derwood KaplanNanavati, Eliz Nigg, MD Authorized by: Derwood KaplanNanavati, Ritaj Dullea, MD   Critical care provider statement:    Critical care time (minutes):  45   Critical care was necessary to treat or prevent imminent or life-threatening deterioration of the following conditions:  Sepsis   Critical care was time spent personally by me on the following activities:  Discussions with consultants, evaluation of patient's response to treatment, examination of patient, ordering and performing treatments and interventions, ordering and review of laboratory studies, ordering and review of radiographic studies, pulse oximetry, re-evaluation of patient's condition, obtaining history from patient or surrogate and review of old charts   (including critical care time)  Medications Ordered in ED Medications  metroNIDAZOLE (FLAGYL) IVPB 500 mg (0 mg Intravenous Stopped 07/28/18 1316)  ceFEPIme (MAXIPIME) 2 g in sodium chloride 0.9 % 100 mL IVPB (has no administration in time range)  vancomycin (VANCOCIN) IVPB 1000 mg/200 mL premix (has no administration in time range)  sodium chloride 0.9 % bolus 1,000 mL (has no administration in time range)  lactated ringers bolus 500 mL (has no administration in time range)  ceFEPIme (MAXIPIME) 2 g in sodium chloride 0.9 % 100 mL IVPB (0 g Intravenous Stopped 07/28/18 1308)  vancomycin (VANCOCIN) IVPB 1000 mg/200 mL premix (0 mg Intravenous Stopped 07/28/18 1439)  sodium chloride 0.9 % bolus 1,000 mL (0 mLs Intravenous Stopped 07/28/18 1430)    And  sodium chloride 0.9 % bolus 1,000 mL (0 mLs Intravenous Stopped 07/28/18 1430)  sodium chloride 0.9 % bolus 1,000 mL (1,000 mLs Intravenous New Bag/Given 07/28/18 1544)    And  sodium chloride 0.9 % bolus 1,000 mL (1,000 mLs Intravenous  New Bag/Given 07/28/18 1546)     Initial Impression / Assessment and Plan / ED Course  I have reviewed the triage vital signs and the nursing notes.  Pertinent  labs & imaging results that were available during my care of the patient were reviewed by me and considered in my medical decision making (see chart for details).  Clinical Course as of Jul 28 1634  Tue Jul 28, 2018  1635 Lactic acid is rising.  Unsure the reason behind it. Patient had already received 2 L - 30 cc/kg of IV fluid when he arrived.  I will order another 100 cc bolus.  He has no new complaints.  Lactic Acid, Venous(!!): 2.8 [AN]    Clinical Course User Index [AN] Derwood KaplanNanavati, Thomasena Vandenheuvel, MD    85 year old male comes in with chief complaint of scrotal swelling and drainage.  It appears that he has had chronic Foley catheter in place and has had swelling of his scrotum for the last several days.  Today he had spontaneous oozing from the scrotum.  It appears that he is having purulent drainage right now.  Clinically this does not appear to be an aggressive necrotizing infection.  Appropriate sepsis work-up was initiated. Blood cultures were drawn prior to the antibiotics by the RN and I confirmed that with her.  I discussed the case with a urologist, and Dr. Loreta AveMann he [facility].  4:36 PM CT scan does not reveal any abscess pocket.  I still think patient will benefit by getting a urologic evaluation while he is inpatient.  The hospitalist has already admitted the patient, prior to the CT scan results.  There is some concerns for osteomyelitis -we will defer the further management of that finding to the admitting team.  Final Clinical Impressions(s) / ED Diagnoses   Final diagnoses:  Scrotal abscess    ED Discharge Orders    None       Derwood KaplanNanavati, Rosamae Rocque, MD 07/28/18 1636

## 2018-07-28 NOTE — Consult Note (Signed)
Reason for Consult:Neurogenic Bladder, Urolithiasis, Non-Complex Renal Cysts, Peno-scrotal abscess  Referring Physician: Sharalyn InkNankit Nanavati MD  [name] Sear is an 85 y.o. male.   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 Cr <1, no hydro 07/2018.   2 - Urolithiasis - large volume bladder stone and non-obstructing right renal stone incidental on imaging x many including ER CT 07/28/2018.   3 - Non-Complex Renal Cysts - large Rt>Lt renal cysts w/o nodules or mass effect on imaging x many.  4 - Peno-scrotal abscess - new penoscrotal swelling x few weeks with spontaneous drainage 07/2018. Exam with penoscrotal abscess cavity but no necrotizing infetions or fistulase. No fevers, mild leukocytosis 15k. Placed wet to dry packing at bedside 07/28/18. CT pelvis without abdominal / pelvic gas or undrained fluid collections.   PMH sig for benign brain tumor, functional paraplegia after sacral osteo (lives at home with home health), colostomy. Pacemaker. Frailty.   Today "Renae Fickleaul" is seen in consultation for above, specifically to rule out GU surgical indicaitons.   Past Medical History:  Diagnosis Date  . Abnormality of gait   . Arthritis   . B12 deficiency   . Bilateral hydronephrosis   . Bladder calculi   . Bleeding ulcer   . Dysrhythmia   . Essential hypertension   . Essential tremor   . Gastritis   . Macular degeneration of left eye   . Memory loss   . Multiple rib fractures   . Neuropathy   . OSA (obstructive sleep apnea)    Does not use CPAP  . PAF (paroxysmal atrial fibrillation) (HCC)   . Presence of permanent cardiac pacemaker   . Tachycardia-bradycardia syndrome (HCC)    [employer] PPM - Dr. Royann Shiversroitoru  . Varicose veins    Bilateral    Past Surgical History:  Procedure Laterality Date  . BRAIN SURGERY    . CATARACT EXTRACTION Bilateral 2013  . COLON SURGERY    .  COLOSTOMY N/A 07/11/2014   Procedure: DIVERTING COLOSTOMY;  Surgeon: Dalia HeadingMark A Jenkins, MD;  Location: [facility];  Service: General;  Laterality: N/A;  wound class: clean contaminated  . ESOPHAGOGASTRODUODENOSCOPY N/A 05/16/2015   Dr. Darrick PennaFields: few linear erosions/ulcerastions in distal esophagus, non-erosive gastritis, no duodenal abnormalities. Path with chronic gastritis, negative H.pylori.   . INCISION AND DRAINAGE OF WOUND N/A 07/11/2014   Procedure: DEBRIDEMENT OF SACRUM;  Surgeon: Dalia HeadingMark A Jenkins, MD;  Location: [facility];  Service: General;  Laterality: N/A;  . INSERT / REPLACE / REMOVE PACEMAKER    . MENINGIOMA REMOVAL  1992   POST RT FRONTAL   . PACEMAKER INSERTION  2006  . PERMANENT PACEMAKER GENERATOR CHANGE N/A 08/13/2012   Procedure: PERMANENT PACEMAKER GENERATOR CHANGE;  Surgeon: Thurmon FairMihai Croitoru, MD;  Location: [facility];  Service: Cardiovascular;  Laterality: N/A;    Family History  Problem Relation Age of Onset  . Aortic aneurysm Mother   . Parkinsonism Mother   . Heart disease Father   . Esophageal cancer Sister   . Colon cancer Neg Hx     Social History:  reports that he has quit smoking. His smoking use included cigarettes. He has a 18.75 pack-year smoking history. He has never used smokeless tobacco. He reports that he does not drink alcohol or use drugs.  Allergies:  Allergies  Allergen Reactions  . Iohexol      Desc: PT STATES THROAT/FACE SWELLING  W/ IVP DYE IN 1990. PT HAS NOT HAD ANY EXAMS DONE W/DYE SINCE AND HAS NEVER BEEN PRE MEDICATED. PER PT, Onset Date: [id number]   . Shellfish Allergy   . Dilantin [Phenytoin Sodium Extended] Rash  . Tegretol [Carbamazepine] Rash    Medications: I have reviewed the patient's current medications.  Results for orders placed or performed during the hospital encounter of 07/28/18 (from the past 48 hour(s))  Lactic acid, plasma     Status: Abnormal   Collection Time: 07/28/18 11:50 AM  Result Value Ref Range   Lactic Acid, Venous  2.1 (HH) 0.5 - 1.9 mmol/L    Comment: CRITICAL RESULT CALLED TO, READ BACK BY AND VERIFIED WITH: HOLCOME,R AT 1241 ON 2.11.20 BY ISLEY,B Performed at [name], [address] [place], [place] [address]   Comprehensive metabolic panel     Status: Abnormal   Collection Time: 07/28/18 11:50 AM  Result Value Ref Range   Sodium 125 (L) 135 - 145 mmol/L   Potassium 4.8 3.5 - 5.1 mmol/L   Chloride 92 (L) 98 - 111 mmol/L   CO2 23 22 - 32 mmol/L   Glucose, Bld 107 (H) 70 - 99 mg/dL   BUN 19 8 - 23 mg/dL   Creatinine, Ser 1.190.68 0.61 - 1.24 mg/dL   Calcium 8.3 (L) 8.9 - 10.3 mg/dL   Total Protein 6.5 6.5 - 8.1 g/dL   Albumin 2.9 (L) 3.5 - 5.0 g/dL   AST 20 15 - 41 U/L   ALT 15 0 - 44 U/L   Alkaline Phosphatase 92 38 - 126 U/L   Total Bilirubin 0.4 0.3 - 1.2 mg/dL   GFR calc non Af Amer >60 >60 mL/min   GFR calc Af Amer >60 >60 mL/min   Anion gap 10 5 - 15    Comment: Performed at [name], [address] [place], [place] [address]  CBC WITH DIFFERENTIAL     Status: Abnormal   Collection Time: 07/28/18 11:50 AM  Result Value Ref Range   WBC 15.4 (H) 4.0 - 10.5 K/uL   RBC 4.52 4.22 - 5.81 MIL/uL   Hemoglobin 11.4 (L) 13.0 - 17.0 g/dL   HCT 95.636.4 (L) 21.339.0 - 08.652.0 %   MCV 80.5 80.0 - 100.0 fL   MCH 25.2 (L) 26.0 - 34.0 pg   MCHC 31.3 30.0 - 36.0 g/dL   RDW 57.817.9 (H) 46.911.5 - 62.915.5 %   Platelets 333 150 - 400 K/uL   nRBC 0.0 0.0 - 0.2 %   Neutrophils Relative % 88 %   Neutro Abs 13.6 (H) 1.7 - 7.7 K/uL   Lymphocytes Relative 6 %   Lymphs Abs 0.9 0.7 - 4.0 K/uL   Monocytes Relative 4 %   Monocytes Absolute 0.7 0.1 - 1.0 K/uL   Eosinophils Relative 1 %   Eosinophils Absolute 0.1 0.0 - 0.5 K/uL   Basophils Relative 0 %   Basophils Absolute 0.0 0.0 - 0.1 K/uL   Immature Granulocytes 1 %   Abs Immature Granulocytes 0.10 (H) 0.00 - 0.07 K/uL    Comment: Performed at [name], [address] [place], [place] [address]  Urinalysis, Routine w reflex microscopic     Status: Abnormal    Collection Time: 07/28/18 12:13 PM  Result Value Ref Range   Color, Urine YELLOW YELLOW   APPearance CLOUDY (A) CLEAR   Specific Gravity, Urine 1.013 1.005 - 1.030   pH 8.0 5.0 - 8.0   Glucose, UA NEGATIVE NEGATIVE  mg/dL   Hgb urine dipstick SMALL (A) NEGATIVE   Bilirubin Urine NEGATIVE NEGATIVE   Ketones, ur NEGATIVE NEGATIVE mg/dL   Protein, ur NEGATIVE NEGATIVE mg/dL   Nitrite NEGATIVE NEGATIVE   Leukocytes,Ua LARGE (A) NEGATIVE   RBC / HPF 11-20 0 - 5 RBC/hpf   WBC, UA >50 (H) 0 - 5 WBC/hpf   Bacteria, UA FEW (A) NONE SEEN   Squamous Epithelial / LPF 0-5 0 - 5   WBC Clumps PRESENT    Triple Phosphate Crystal PRESENT     Comment: Performed at [facility], [address] [place], [place] [address]  Lactic acid, plasma     Status: Abnormal   Collection Time: 07/28/18  1:23 PM  Result Value Ref Range   Lactic Acid, Venous 2.6 (HH) 0.5 - 1.9 mmol/L    Comment: CRITICAL RESULT CALLED TO, READ BACK BY AND VERIFIED WITH: HOLCOME,R AT 1412 ON 2.11.20 BY ISLEY,B Performed at [facility], [address], [place], [place] [address]   Lactic acid, plasma     Status: Abnormal   Collection Time: 07/28/18  3:40 PM  Result Value Ref Range   Lactic Acid, Venous 2.8 (HH) 0.5 - 1.9 mmol/L    Comment: CRITICAL RESULT CALLED TO, READ BACK BY AND VERIFIED WITH: HOLCOMB,R AT 1610 ON 2.11.20 BY ISLEY,B Performed at [facility], [address] [place], [place] [address]   Lactic acid, plasma     Status: Abnormal   Collection Time: 07/28/18  5:33 PM  Result Value Ref Range   Lactic Acid, Venous 2.8 (HH) 0.5 - 1.9 mmol/L    Comment: CRITICAL RESULT CALLED TO, READ BACK BY AND VERIFIED WITH: MINTER,R ON 07/28/18 AT 1800 BY LOY,C Performed at [facility], [address], [place], [place] [address]     Ct Abdomen Pelvis Wo Contrast  Result Date: 07/28/2018 CLINICAL DATA:  Scrotal abscess, swelling, pain, drainage EXAM: CT ABDOMEN AND PELVIS WITHOUT CONTRAST TECHNIQUE: Multidetector CT imaging  of the abdomen and pelvis was performed following the standard protocol without IV contrast. COMPARISON:  CT abdomen pelvis, 08/18/2016 FINDINGS: Lower chest: Small right pleural effusion. Bibasilar scarring or atelectasis. Hepatobiliary: No focal liver abnormality is seen. Gallstones. No wall thickening, or biliary dilatation. Pancreas: Unremarkable. No pancreatic ductal dilatation or surrounding inflammatory changes. Spleen: Normal in size without focal abnormality. Adrenals/Urinary Tract: Adrenal glands are unremarkable. There is a redemonstrated, extremely large exophytic cyst of the right kidney measuring at least 24 cm, slightly increased in size compared to prior examination (series 6, image 39). There are multiple right renal calculi including a staghorn calculus of the right renal pelvis. Numerous bladder calculi in the dependent urinary bladder. Foley catheter and multiple diverticula of the thickened bladder. Prostatomegaly. Stomach/Bowel: Stomach is within normal limits. No evidence of bowel wall thickening, distention, or inflammatory changes. Status post sigmoid resection and left lower quadrant ostomy. Vascular/Lymphatic: Aortic atherosclerosis. No enlarged abdominal or pelvic lymph nodes. Reproductive: No mass or other abnormality. Other: No abdominal wall hernia or abnormality. No abdominopelvic ascites. Scrotal edema without discrete fluid collection appreciated. Musculoskeletal: Sacral and right ischial decubitus ulcers with underlying osseous sclerosis (series 3, image 60) concerning for osteomyelitis. IMPRESSION: 1. Scrotal edema without discrete fluid collection appreciated. 2. Sacral and right ischial decubitus ulcers with underlying osseous sclerosis (series 3, image 60) concerning for osteomyelitis. 3. Multiple chronic and incidental findings of the urinary tract: There is a redemonstrated, extremely large exophytic cyst of the right kidney measuring at least 24 cm, slightly  increased in  size compared to prior examination (series 6, image 39). There are multiple right renal calculi including a staghorn calculus of the right renal pelvis. Numerous bladder calculi in the dependent urinary bladder. Foley catheter and multiple diverticula of the thickened bladder. Prostatomegaly. 4.  Status post sigmoid resection and left lower quadrant ostomy. 5.  Other chronic and incidental findings as detailed above. Electronically Signed   By: Lauralyn Primes M.D.   On: 07/28/2018 13:40    Review of Systems  Constitutional: Positive for malaise/fatigue. Negative for fever.  HENT: Negative.   Eyes: Negative.   Respiratory: Negative.   Cardiovascular: Negative.  Negative for chest pain.  Gastrointestinal: Negative.  Negative for heartburn and nausea.  Genitourinary: Negative.   Musculoskeletal: Negative.   Skin: Negative.   Neurological: Positive for sensory change and focal weakness.  Endo/Heme/Allergies: Negative.   Psychiatric/Behavioral: Negative.    Blood pressure 105/71, pulse (!) 104, temperature (!) 97.5 F (36.4 C), temperature source Oral, resp. rate 20, height 5\' 8"  (1.727 m), weight 59 kg, SpO2 100 %. Physical Exam  Constitutional:  Very pleasant, but debilitated.   HENT:  Head: Normocephalic.  Eyes: Pupils are equal, round, and reactive to light.  Neck: Normal range of motion.  Cardiovascular:  Mild tachycardia.   Respiratory: Effort normal.  GI: Soft.  Midline scar w/o henrias. LLQ colostomy patent of stool and gas.   Genitourinary:    Genitourinary Comments: UNcircumcised with foley in place with cloudy urine.   5cm open area of penoscrotal junction with minimal active drainage of purlulent fluid. No crepitus / fluctuence. NO palpable urethral fistula.  Wet to dry Kerlix (abotu 8 inches gauze) placed with dry abd over.      Assessment/Plan:  1 - Neurogenic Bladder - continue chronic foley changed Q monthly by [facility]. Consider SPT should urethral erosion develop.    2 - Urolithiasis - not surgical candidate for removal, would be too risky.   3 - Non-Complex Renal Cysts - no surgical indications, observe.   4 - Peno-scrotal abscess - fortunately this has spontaneously drained quite well. I do not feel any surgical debridement warranted as all skin edges appear viable. Only rec daily wet to dry packing and heal by secondary intention. Consider short term ABX with gram+ and gram - coverage only until no lactic acidosis / leukocytosis, but then no further.   AT discharge, HHRN to take over dressing changes. This will likely take weeks-mos to completely heal.   Sebastian Ache 07/28/2018, 10:12 PM

## 2018-07-28 NOTE — ED Notes (Signed)
CRITICAL VALUE ALERT  Critical Value:  Lactic acid 2.8  Date & Time Notied:  07/28/2018, 1804  Provider Notified: Dr. Laural Benes  Orders Received/Actions taken: no new orders at this time

## 2018-07-28 NOTE — ED Notes (Signed)
Date and time results received: 02/11/201412 (use smartphrase ".now" to insert current time)  Test: lactic acid  Critical Value: 2.3  Name of Provider Notified: Rhunette Croft MD  Orders Received? Or Actions Taken?: n/a

## 2018-07-28 NOTE — ED Notes (Signed)
Critical value lactic acid 2.8.  Notified to [facility] MD

## 2018-07-28 NOTE — Progress Notes (Signed)
Pharmacy Antibiotic Note  Jon [name] is a 85 y.o. male admitted on 07/28/2018 with infection-unknown source/ scrotal cellulitis and abscess. Pharmacy has been consulted for Vancomycin and Cefepime dosing.  Plan: Vancomycin 1000 mg IV every 24 hours.  Goal trough 15-20 mcg/mL.  Cefepime 2000 mg IV every 24 hours. Monitor labs, c/s, and vanco levels as indicated.   Height: 5\' 8"  (172.7 cm) Weight: 130 lb (59 kg) IBW/kg (Calculated) : 68.4  Temp (24hrs), Avg:98.6 F (37 C), Min:98.6 F (37 C), Max:98.6 F (37 C)  Recent Labs  Lab 07/28/18 1150 07/28/18 1323  WBC 15.4*  --   CREATININE 0.68  --   LATICACIDVEN 2.1* 2.6*    Estimated Creatinine Clearance: 57.4 mL/min (by C-G formula based on SCr of 0.68 mg/dL).    Allergies  Allergen Reactions  . Iohexol      Desc: PT STATES THROAT/FACE SWELLING W/ IVP DYE IN 1990. PT HAS NOT HAD ANY EXAMS DONE W/DYE SINCE AND HAS NEVER BEEN PRE MEDICATED. PER PT, Onset Date: [id number]   . Shellfish Allergy   . Dilantin [Phenytoin Sodium Extended] Rash  . Tegretol [Carbamazepine] Rash    Antimicrobials this admission: Vanco 2/11 >>  Cefepime 2/11 >>  Flagyl 2/11 >>  Microbiology results: 2/11 BCx: pending 2/11 Wound: pending    Thank you for allowing pharmacy to be a part of this patient's care.  Tad Moore 07/28/2018 2:30 PM

## 2018-07-28 NOTE — ED Notes (Signed)
Date and time results received: 07/28/18 1241 (use smartphrase ".now" to insert current time)  Test:Lactic Acid   Critical Value: 2.1  Name of Provider Notified: Rhunette CroftNanavati MD  Orders Received? Or Actions Taken?: n/a

## 2018-07-28 NOTE — Progress Notes (Signed)
Paged the hospitalist tp come and see the pt.

## 2018-07-28 NOTE — Progress Notes (Signed)
Paged  urology Dr. Berneice Heinrichmanny

## 2018-07-29 LAB — MRSA PCR SCREENING: MRSA BY PCR: NEGATIVE

## 2018-07-29 MED ORDER — SODIUM CHLORIDE 0.9 % IV SOLN
1.0000 g | Freq: Two times a day (BID) | INTRAVENOUS | Status: DC
  Administered 2018-07-29 – 2018-07-30 (×4): 1 g via INTRAVENOUS
  Filled 2018-07-29 (×7): qty 1

## 2018-07-29 MED ORDER — ENSURE ENLIVE PO LIQD
237.0000 mL | Freq: Two times a day (BID) | ORAL | Status: DC
  Administered 2018-07-31 – 2018-08-02 (×6): 237 mL via ORAL

## 2018-07-29 MED ORDER — SENNOSIDES-DOCUSATE SODIUM 8.6-50 MG PO TABS
2.0000 | ORAL_TABLET | Freq: Two times a day (BID) | ORAL | Status: DC
  Administered 2018-07-29 – 2018-08-02 (×9): 2 via ORAL
  Filled 2018-07-29 (×9): qty 2

## 2018-07-29 NOTE — Progress Notes (Signed)
Pharmacy Antibiotic Note  Jon [name] is a 85 y.o. male admitted on 07/28/2018 with infection-unknown source/ scrotal cellulitis and abscess. Pharmacy has been consulted for Vancomycin and Cefepime dosing.  Plan: 1) Continue vancomycin 1g IV q24 - goal AUC 400-500 2) Will change cefepime from 2000 mg IV q12 to 1g IV q12 for CrCl > 50 ml/min and with patient only being 41kg and age of 85 yo, 1g dosing seems more appropriate 3) Monitor labs, c/s, and vanco levels as indicated.   Height: 5\' 8"  (172.7 cm) Weight: 130 lb (59 kg) IBW/kg (Calculated) : 68.4  Temp (24hrs), Avg:97.9 F (36.6 C), Min:97.5 F (36.4 C), Max:98.6 F (37 C)  Recent Labs  Lab 07/28/18 1150 07/28/18 1323 07/28/18 1540 07/28/18 1733  WBC 15.4*  --   --   --   CREATININE 0.68  --   --   --   LATICACIDVEN 2.1* 2.6* 2.8* 2.8*    Estimated Creatinine Clearance: 57.4 mL/min (by C-G formula based on SCr of 0.68 mg/dL).    Allergies  Allergen Reactions  . Iohexol      Desc: PT STATES THROAT/FACE SWELLING W/ IVP DYE IN 1990. PT HAS NOT HAD ANY EXAMS DONE W/DYE SINCE AND HAS NEVER BEEN PRE MEDICATED. PER PT, Onset Date: [id number]   . Shellfish Allergy   . Dilantin [Phenytoin Sodium Extended] Rash  . Tegretol [Carbamazepine] Rash    Antimicrobials this admission: Vanco 2/11 >>  Cefepime 2/11 >>  Flagyl 2/11 >>  Microbiology results: 2/11 BCx: pending 2/11 Wound: pending    Thank you for allowing pharmacy to be a part of this patient's care.  Berkley Harvey 07/29/2018 9:50 AM

## 2018-07-29 NOTE — Progress Notes (Signed)
07/29/2018  1718  Irine Seal, SW from [facility] DSS Adult [employer] [phone] ext [phone]. Came to see the patient. Judeth Cornfield was concerned with the patients narcotic medicines are missing and the patient is testing negative for narcotic, the wounds, and the neglect of the patient. Judeth Cornfield states she has been involved with the family for about a month. Notified MD that [facility] is here and wanted to speak with her. Judeth Cornfield asked about the patients pain. She was informed that the last pain med was around 6am and the patient has not c/o pain or requested pain medicines, but did request food.

## 2018-07-29 NOTE — Consult Note (Addendum)
WOC Nurse wound consult note Patient receiving care in [facility].  Son, Tawanna Coolerodd, in room Reason for Consult: Scrotal and sacral wounds Wound type: See note for each wound Pressure Injury POA: Yes Measurements: Scrotal wound is result of infectious abscess.  The wound measures 2.6 cm x 2.8 cm x 5 cm.  I cannot see the wound bed. There is no foul odor detected. There was a saline moistened gauze packing in the wound upon my arrival.  I have dressed the area in the manner in which I have ordered care for the wound.  The patient's entire back is peeling, bleeding, and has heavy fungal rash involvement.  There is fungal involvement on the scrotum and inner, upper thighs as well.  These areas were cleansed and two applications of antifungal powder were applied.  It does appear that there are several evolving DTPIs to various bony prominences on the back, but it is very difficult to know precisely where these are because of the bright red, fungal rash and bleeding that is overpowering the ability to see other discolorations that are underlying.  This patient's skin is in very poor condition.  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  Therefore, I will refer to them as healing stage 4 wounds.  The entire area that is impacted measures 8 cm x 7 cm x 0.3 cm.  There are several distinct wound areas separated by narrow epithelial islands.  The wound areas are primarily pink, but do have what appears as a thin yellow biofilm.  There is no foul odor detected.  Tawanna Coolerodd tells me they have been treating it with Hydrofera blue, which we do no carry.  I have placed, and ordered, a sacral foam dressing to the area.  Both feet have multiple bony prominences that are either covered in thick callus, or abraded with partial thickness wounds. Bilateral plantar, 5th metatarsal heads have combo wounds as I have described.  Some have a combination of both.  Basically every bony  prominence, and the great toes and second toes of both feet are impacted.  The patient's existing heel lift boots are heavily soiled, I have thrown them away and placed an order for a new pair.   Periwound: Every wound on both feet is surrounded by dry, flaking, peeling skin. Dressing procedure/placement/frequency: For the scrotum, insert saline moistened gauze into the scrotal wound. Cover with ABD pads. Change each shift. For the sacrum, maintain a sacral foam dressing on the sacral wound. Change every 3 days and prn. For the back, scrotum, and inner upper thighs affected by fungus, pply antifungal powder on the patient's back, scrotum, and inner upper thighs every 4 hours. For the discolored and/or abraded areas on the feet, apply iodine (from the swabsticks in clean utility) to all discolored or abraded areas on the feet, allow to air dry.  Place feet in heel lift boots. Turn the patient every 2 hours. Monitor the wound area(s) for worsening of condition such as: Signs/symptoms of infection,  Increase in size,  Development of or worsening of odor, Development of pain, or increased pain at the affected locations.  Notify the medical team if any of these develop.  The patient has a LUQ fecal ostomy producing formed brown stool.  His current pouch is a flat, 2 piece, cut to fit by [employer].  I will order ostomy supplies to have placed at the bedside.  Thank you  for the consult.  Discussed plan of care with the patient and bedside nurse.  WOC nurse will not follow at this time.  Please re-consult the WOC team if needed.  Helmut Muster, RN, MSN, CWOCN, CNS-BC, pager [phone]

## 2018-07-29 NOTE — Progress Notes (Signed)
Pharmacy Antibiotic Note  Jon [name] is a 85 y.o. male admitted on 07/28/2018 with infection-unknown source/ scrotal cellulitis and abscess. Pharmacy has been consulted for Vancomycin and Cefepime dosing.  Plan: 1) Continue vancomycin 1g IV q24 - goal AUC 400-500 2) Will change cefepime from 2000 mg IV every 24 hours to 1g IV q12 for CrCl > 50 ml/min 3) Monitor labs, c/s, and vanco levels as indicated.   Height: 5\' 8"  (172.7 cm) Weight: 130 lb (59 kg) IBW/kg (Calculated) : 68.4  Temp (24hrs), Avg:97.9 F (36.6 C), Min:97.5 F (36.4 C), Max:98.6 F (37 C)  Recent Labs  Lab 07/28/18 1150 07/28/18 1323 07/28/18 1540 07/28/18 1733  WBC 15.4*  --   --   --   CREATININE 0.68  --   --   --   LATICACIDVEN 2.1* 2.6* 2.8* 2.8*    Estimated Creatinine Clearance: 57.4 mL/min (by C-G formula based on SCr of 0.68 mg/dL).    Allergies  Allergen Reactions  . Iohexol      Desc: PT STATES THROAT/FACE SWELLING W/ IVP DYE IN 1990. PT HAS NOT HAD ANY EXAMS DONE W/DYE SINCE AND HAS NEVER BEEN PRE MEDICATED. PER PT, Onset Date: [id number]   . Shellfish Allergy   . Dilantin [Phenytoin Sodium Extended] Rash  . Tegretol [Carbamazepine] Rash    Antimicrobials this admission: Vanco 2/11 >>  Cefepime 2/11 >>  Flagyl 2/11 >>  Microbiology results: 2/11 BCx: pending 2/11 Wound: pending    Thank you for allowing pharmacy to be a part of this patient's care.  Berkley Harvey 07/29/2018 9:47 AM

## 2018-07-29 NOTE — Plan of Care (Signed)
  Problem: Nutrition: Goal: Adequate nutrition will be maintained Outcome: Progressing   Problem: Pain Managment: Goal: General experience of comfort will improve Outcome: Progressing   Problem: Elimination: Goal: Will not experience complications related to bowel motility Outcome: Progressing   

## 2018-07-29 NOTE — Progress Notes (Addendum)
PROGRESS NOTE  [name] TKW:[id number] DOB: 1931/08/24 DOA: 07/28/2018 PCP: Gareth Morgan, MD  HPI/Recap of past 24 hours: Jon [name] is a 85 y.o. male former smoker with a complex past medical history and who had previously been in residential hospice but now lives at home with his son was brought him into the emergency department by EMS from his home when he reports that his chronic scrotal abscess started spontaneously draining.  He had been dealing with this scrotal abscess for several weeks.  He has been followed by his outpatient providers and had been taking antibiotics for this.  He also has been bedbound for the past several years.  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  He has severe protein calorie malnutrition.  He is chronically dehydrated.  He has paroxysmal atrial fibrillation but is not anticoagulated due to history of severe bleeding.  He has a cardiac pacemaker in place.  He has had multiple admissions for sepsis and hypotension.  07/29/18: Seen and examined with his son at his bedside.  Reports pain in his scrotum which he is unable to rate.  Due for his pain medication at the time of this visit.   Assessment/Plan: Principal Problem:   Scrotal abscess Active Problems:   Hyponatremia   Anemia, chronic disease   PAF (paroxysmal atrial fibrillation) (HCC)   Tachycardia-bradycardia syndrome (HCC)   Cardiac pacemaker in situ   Sacral decubitus ulcer, stage IV (HCC)   Sepsis    Leukocytosis  Sepsis secondary to peno-scrotal abscess Presented with leukocytosis, tachypnea and tachycardia in the setting of penile scrotal abscess Latest WBC 15 K, latest lactic acid 2.8 Continue IV antibiotics and repeat CBC in the morning with differential Currently on cefepime, IV Flagyl and IV vancomycin Monitor fever curve Blood cultures x2 no growth to date Continue gentle IV fluid hydration Continue pain management and bowel  regimen Monitor urine output  Chronic hypervolemic hyponatremia Encourage increasing po solid food intake Latest sodium 125 on 07/28/2018 Currently on normal saline at 75 cc/h due to poor oral intake Reduce IV fluid hydration to 50 cc/h Repeat BMP in the morning and if no improvement start sodium tablets 3 times daily and closely monitor sodium levels Monitor urine output  Neurogenic bladder Has indwelling Foley catheter Continue to monitor Follow-up with urology outpatient  Non-Complex Renal Cysts Noted Probably urology outpatient  Tachycardia-bradycardia syndrome-patient has indwelling pacemaker.  Paroxysmal atrial fibrillation- heart rate is stable.  He is not anticoagulated now because of severe bleeding in the past.  Stage IV sacral decubitus ulcers-CT scan is suggesting possible osteomyelitis.  Evaluate further when clinically stabilized.  Severe protein calorie malnutrition- dietitian consultation.  DVT Prophylaxis: subcut heparin 3 times daily Code Status: DNR   Family Communication:  Updated son at bedside.  All questions answered to his satisfaction. Disposition Plan:  Home when leukocytosis has resolved and hemodynamically stable. Consult: Urology   Objective: Vitals:   07/28/18 1845 07/28/18 1900 07/28/18 2111 07/29/18 0554  BP:  105/72 105/71 100/75  Pulse:  96 (!) 104 94  Resp: 18 15 20 20   Temp:   (!) 97.5 F (36.4 C) (!) 97.5 F (36.4 C)  TempSrc:   Oral Oral  SpO2:  98% 100% 99%  Weight:      Height:        Intake/Output Summary (Last 24 hours) at 07/29/2018 1236 Last data filed at 07/28/2018 1930 Gross per 24 hour  Intake 6400 ml  Output  1000 ml  Net 5400 ml   Filed Weights   07/28/18 1042  Weight: 59 kg    Exam:  . General: 85 y.o. year-old male well developed well nourished in no acute distress.  Somnolent but easily arousable to voices. . Cardiovascular: Regular rate and rhythm with no rubs or gallops.  No thyromegaly or JVD noted.     Marland Kitchen Respiratory: Clear to auscultation with no wheezes or rales. Good inspiratory effort. . Skin: Scrotum erythematous, dressing in place. Marland Kitchen Psychiatry: Mood is appropriate for condition and setting   Data Reviewed: CBC: Recent Labs  Lab 07/28/18 1150  WBC 15.4*  NEUTROABS 13.6*  HGB 11.4*  HCT 36.4*  MCV 80.5  PLT 333   Basic Metabolic Panel: Recent Labs  Lab 07/28/18 1150  NA 125*  K 4.8  CL 92*  CO2 23  GLUCOSE 107*  BUN 19  CREATININE 0.68  CALCIUM 8.3*   GFR: Estimated Creatinine Clearance: 57.4 mL/min (by C-G formula based on SCr of 0.68 mg/dL). Liver Function Tests: Recent Labs  Lab 07/28/18 1150  AST 20  ALT 15  ALKPHOS 92  BILITOT 0.4  PROT 6.5  ALBUMIN 2.9*   No results for input(s): LIPASE, AMYLASE in the last 168 hours. No results for input(s): AMMONIA in the last 168 hours. Coagulation Profile: No results for input(s): INR, PROTIME in the last 168 hours. Cardiac Enzymes: No results for input(s): CKTOTAL, CKMB, CKMBINDEX, TROPONINI in the last 168 hours. BNP (last 3 results) No results for input(s): PROBNP in the last 8760 hours. HbA1C: No results for input(s): HGBA1C in the last 72 hours. CBG: No results for input(s): GLUCAP in the last 168 hours. Lipid Profile: No results for input(s): CHOL, HDL, LDLCALC, TRIG, CHOLHDL, LDLDIRECT in the last 72 hours. Thyroid Function Tests: No results for input(s): TSH, T4TOTAL, FREET4, T3FREE, THYROIDAB in the last 72 hours. Anemia Panel: No results for input(s): VITAMINB12, FOLATE, FERRITIN, TIBC, IRON, RETICCTPCT in the last 72 hours. Urine analysis:    Component Value Date/Time   COLORURINE YELLOW 07/28/2018 1213   APPEARANCEUR CLOUDY (A) 07/28/2018 1213   LABSPEC 1.013 07/28/2018 1213   PHURINE 8.0 07/28/2018 1213   GLUCOSEU NEGATIVE 07/28/2018 1213   HGBUR SMALL (A) 07/28/2018 1213   BILIRUBINUR NEGATIVE 07/28/2018 1213   KETONESUR NEGATIVE 07/28/2018 1213   PROTEINUR NEGATIVE 07/28/2018  1213   UROBILINOGEN 0.2 03/14/2015 1515   NITRITE NEGATIVE 07/28/2018 1213   LEUKOCYTESUR LARGE (A) 07/28/2018 1213   Sepsis Labs: @LABRCNTIP (procalcitonin:4,lacticidven:4)  ) Recent Results (from the past 240 hour(s))  Blood culture (routine x 2)     Status: None (Preliminary result)   Collection Time: 07/28/18 11:51 AM  Result Value Ref Range Status   Specimen Description BLOOD  Final   Special Requests NONE  Final   Culture   Final    NO GROWTH < 24 HOURS Performed at [facility], [address] [place], [place] [address]    Report Status PENDING  Incomplete  Blood culture (routine x 2)     Status: None (Preliminary result)   Collection Time: 07/28/18 12:10 PM  Result Value Ref Range Status   Specimen Description BLOOD  Final   Special Requests NONE  Final   Culture   Final    NO GROWTH < 24 HOURS Performed at [facility], [address] [place], [place] [address]    Report Status PENDING  Incomplete  Wound or Superficial Culture     Status: None (Preliminary result)  Collection Time: 07/28/18 12:33 PM  Result Value Ref Range Status   Specimen Description   Final    SCROTUM Performed at [name], [address] [place], [place] [address]    Special Requests   Final    Normal Performed at [name], [address] [place], [place] [address]    Gram Stain   Final    MODERATE WBC PRESENT, PREDOMINANTLY PMN FEW GRAM VARIABLE ROD RARE GRAM POSITIVE COCCI    Culture   Final    CULTURE REINCUBATED FOR BETTER GROWTH Performed at [facility], [address] [place], [place] [address]    Report Status PENDING  Incomplete      Studies: Ct Abdomen Pelvis Wo Contrast  Result Date: 07/28/2018 CLINICAL DATA:  Scrotal abscess, swelling, pain, drainage EXAM: CT ABDOMEN AND PELVIS WITHOUT CONTRAST TECHNIQUE: Multidetector CT imaging of the abdomen and pelvis was performed following the standard protocol without IV contrast. COMPARISON:  CT abdomen pelvis,  08/18/2016 FINDINGS: Lower chest: Small right pleural effusion. Bibasilar scarring or atelectasis. Hepatobiliary: No focal liver abnormality is seen. Gallstones. No wall thickening, or biliary dilatation. Pancreas: Unremarkable. No pancreatic ductal dilatation or surrounding inflammatory changes. Spleen: Normal in size without focal abnormality. Adrenals/Urinary Tract: Adrenal glands are unremarkable. There is a redemonstrated, extremely large exophytic cyst of the right kidney measuring at least 24 cm, slightly increased in size compared to prior examination (series 6, image 39). There are multiple right renal calculi including a staghorn calculus of the right renal pelvis. Numerous bladder calculi in the dependent urinary bladder. Foley catheter and multiple diverticula of the thickened bladder. Prostatomegaly. Stomach/Bowel: Stomach is within normal limits. No evidence of bowel wall thickening, distention, or inflammatory changes. Status post sigmoid resection and left lower quadrant ostomy. Vascular/Lymphatic: Aortic atherosclerosis. No enlarged abdominal or pelvic lymph nodes. Reproductive: No mass or other abnormality. Other: No abdominal wall hernia or abnormality. No abdominopelvic ascites. Scrotal edema without discrete fluid collection appreciated. Musculoskeletal: Sacral and right ischial decubitus ulcers with underlying osseous sclerosis (series 3, image 60) concerning for osteomyelitis. IMPRESSION: 1. Scrotal edema without discrete fluid collection appreciated. 2. Sacral and right ischial decubitus ulcers with underlying osseous sclerosis (series 3, image 60) concerning for osteomyelitis. 3. Multiple chronic and incidental findings of the urinary tract: There is a redemonstrated, extremely large exophytic cyst of the right kidney measuring at least 24 cm, slightly increased in size compared to prior examination (series 6, image 39). There are multiple right renal calculi including a staghorn calculus of  the right renal pelvis. Numerous bladder calculi in the dependent urinary bladder. Foley catheter and multiple diverticula of the thickened bladder. Prostatomegaly. 4.  Status post sigmoid resection and left lower quadrant ostomy. 5.  Other chronic and incidental findings as detailed above. Electronically Signed   By: Lauralyn PrimesAlex  Bibbey M.D.   On: 07/28/2018 13:40    Scheduled Meds: . heparin  5,000 Units Subcutaneous Q8H    Continuous Infusions: . sodium chloride 75 mL/hr at 07/29/18 1102  . ceFEPime (MAXIPIME) IV    . metronidazole 500 mg (07/29/18 1103)  . vancomycin       LOS: 1 day     Darlin Droparole N Karlie Aung, MD [facility] Pager [phone]  If 7PM-7AM, please contact night-coverage [email or web address] Password [email or web address] 07/29/2018, 12:36 PM

## 2018-07-29 NOTE — Progress Notes (Signed)
Pt. arrived to the unit via care link.A&O X4,pale skin worm mottled and dry skin to tautch. As to pt. He lives with his son at home .Pt. back skin is  severely mottled and peeling  As a result pt. Skin is  fragile easily bleeding.Pt. has un stagible  pressure ulcer at his scrum and ,It is covered with sloughs and eschar.Pt. legs are with dry and necrotic skin around bilateral foot. BLE  Constructed and mottled.Pt. scourtum is swollen and yellowish pus oozing from the upper part of the scourtum. Urology come and do the dressing until the wound care see's pt.

## 2018-07-29 NOTE — Plan of Care (Signed)
  Problem: Coping: Goal: Level of anxiety will decrease Outcome: Progressing   Problem: Nutrition: Goal: Adequate nutrition will be maintained Outcome: Progressing   Problem: Elimination: Goal: Will not experience complications related to bowel motility Outcome: Progressing Goal: Will not experience complications related to urinary retention Outcome: Progressing   Problem: Elimination: Goal: Will not experience complications related to urinary retention Outcome: Progressing

## 2018-07-30 ENCOUNTER — Inpatient Hospital Stay (HOSPITAL_COMMUNITY): Payer: Medicare Other

## 2018-07-30 LAB — COMPREHENSIVE METABOLIC PANEL
ALT: 12 U/L (ref 0–44)
AST: 14 U/L — ABNORMAL LOW (ref 15–41)
Albumin: 2.5 g/dL — ABNORMAL LOW (ref 3.5–5.0)
Alkaline Phosphatase: 73 U/L (ref 38–126)
Anion gap: 5 (ref 5–15)
BUN: 13 mg/dL (ref 8–23)
CO2: 20 mmol/L — AB (ref 22–32)
Calcium: 7.9 mg/dL — ABNORMAL LOW (ref 8.9–10.3)
Chloride: 106 mmol/L (ref 98–111)
Creatinine, Ser: 0.68 mg/dL (ref 0.61–1.24)
GFR calc Af Amer: >60 mL/min (ref >60)
GFR calc non Af Amer: >60 mL/min (ref >60)
Glucose, Bld: 82 mg/dL (ref 70–99)
Potassium: 4.3 mmol/L (ref 3.5–5.1)
SODIUM: 131 mmol/L — AB (ref 135–145)
Total Bilirubin: 0.8 mg/dL (ref 0.3–1.2)
Total Protein: 5.3 g/dL — ABNORMAL LOW (ref 6.5–8.1)

## 2018-07-30 LAB — CBC WITH DIFFERENTIAL/PLATELET
Abs Immature Granulocytes: 0.05 10*3/uL (ref 0.00–0.07)
Basophils Absolute: 0.1 10*3/uL (ref 0.0–0.1)
Basophils Relative: 1 %
Eosinophils Absolute: 0.3 10*3/uL (ref 0.0–0.5)
Eosinophils Relative: 3 %
HCT: 32.8 % — ABNORMAL LOW (ref 39.0–52.0)
Hemoglobin: 10 g/dL — ABNORMAL LOW (ref 13.0–17.0)
IMMATURE GRANULOCYTES: 1 %
Lymphocytes Relative: 12 %
Lymphs Abs: 1 10*3/uL (ref 0.7–4.0)
MCH: 26.1 pg (ref 26.0–34.0)
MCHC: 30.5 g/dL (ref 30.0–36.0)
MCV: 85.6 fL (ref 80.0–100.0)
Monocytes Absolute: 0.6 10*3/uL (ref 0.1–1.0)
Monocytes Relative: 7 %
NEUTROS PCT: 76 %
Neutro Abs: 6.6 10*3/uL (ref 1.7–7.7)
PLATELETS: 263 10*3/uL (ref 150–400)
RBC: 3.83 MIL/uL — ABNORMAL LOW (ref 4.22–5.81)
RDW: 18.2 % — ABNORMAL HIGH (ref 11.5–15.5)
WBC: 8.5 10*3/uL (ref 4.0–10.5)
nRBC: 0 % (ref 0.0–0.2)

## 2018-07-30 LAB — MAGNESIUM: Magnesium: 1.7 mg/dL (ref 1.7–2.4)

## 2018-07-30 MED ORDER — DILTIAZEM HCL 30 MG PO TABS
30.0000 mg | ORAL_TABLET | Freq: Four times a day (QID) | ORAL | Status: DC
  Administered 2018-07-30 – 2018-07-31 (×4): 30 mg via ORAL
  Filled 2018-07-30 (×5): qty 1

## 2018-07-30 NOTE — Progress Notes (Signed)
PROGRESS NOTE  Jon [name] ZHY:[id number] DOB: 07/25/1931 DOA: 07/28/2018 PCP: Gareth MorganKnowlton, Steve, MD  Brief History   Jon [name] 85 y.o.maleformer smoker with [name] complex past medical history and who had previously been in residential hospicebut now lives at home with his sonwas brought him into the emergency department by EMS from[place] when he reports thathis chronicscrotal abscess started spontaneously draining. He had been dealing with this scrotal abscess for several weeks. He has been followed by his outpatient providers and had been taking antibiotics for this. He also has been bedbound for the past several years. 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 He has severe protein calorie malnutrition. He is chronically dehydrated. He has paroxysmal atrial fibrillation but is not anticoagulated due to history of severe bleeding. He has [name] cardiac pacemaker in place. He has had multiple admissions for sepsis and hypotension.  Urology has consulted and was able to effect spontaneous drainage of the abscess such that he does not feel that operative debridement is necessary. His recommendation is for short term antibiotics with both bram negative and positive covereage until resolution of the lactic acidosis and leukocytosis and then no further. He recommends ongoing wound care for dressing changes at discharge. He suggests that healing will take weeks to months.  The family has requested that the [facility] transfer to [name] which is closer to their home. He is awaiting an available for transfer.  Today the patient was noted to have [name] heart rate in the 110 - 150 range. EKG documented atrial fibrillation. The patient has been started on oral diltiazem. No anticoagulation is ordered due to the patient's sacral decubitus ulcers.   [name] & P   Principal Problem:   Scrotal abscess Active Problems:   Hyponatremia   Anemia, chronic disease   PAF (paroxysmal atrial fibrillation) (HCC)   Tachycardia-bradycardia syndrome (HCC)   Cardiac pacemaker in situ   Sacral decubitus ulcer, stage IV (HCC)   Sepsis    Leukocytosis  Sepsis secondary to peno-scrotal abscess: The patient presented with leukocytosis, tachypnea and tachycardia in the setting of penile scrotal abscess.Urology has consulted and was able to effect spontaneous drainage of the abscess such that he does not feel that operative debridement is necessary. His recommendation is for short term antibiotics with both bram negative and positive covereage until resolution of the lactic acidosis and leukocytosis and then no further. He recommends ongoing wound care for dressing changes at discharge. He suggests that healing will take weeks to months.Most recent WBC 8.5 K, latest lactic acid 2.8. Will repeat in the am.The patient is currently receiving cefepime, IV Flagyl and IV vancomycin. He is also receiving cautious IV fluids, pain management and bowel regimen. Urine output is adequate. The patient has been afebrile and blood cultures continue to show now growth to date.  Chronic hypervolemic hyponatremia: Resolving. Na 131 this morning. IV NS is running at 50 cc/hr and PO intake is encourage. Monitor.  Neurogenic bladder: Has indwelling Foley catheter. Follow-up with urology outpatient. Monitor.  Non-Complex Renal Cysts: Noted. Follow up with urology outpatient.  Tachycardia-bradycardia syndrome: The patient has indwelling pacemaker. Monitor on telemetry.  Paroxysmal atrial fibrillation: Hear rate has been elevated. The patient has been started on diltiazem.  He is not anticoagulated now because of severe bleeding in the past.  Stage IV sacral decubitus ulcers: CT scan is suggesting possible osteomyelitis. Evaluate further when clinically stabilized.  Severe protein calorie malnutrition:  dietitian consultation.  DVT Prophylaxis:subcut heparin  3 times daily Code  Status:DNR Family Communication: Updated son at bedside.  All questions answered to his satisfaction. Disposition Plan: Home when leukocytosis has resolved and hemodynamically stable. Consult: Urology  Jon [name] [facility] Direct contact: see [email or web address]  7PM-7AM contact night coverage as above 07/30/2018, 1:13 PM  LOS: 2 days   Consultants  . Urology  Antibiotics  . Cefepime, Vancomycin, Flagyl  Interval History/Subjective  Jon [name] 85 y.o.maleformer smoker with [name] complex past medical history and who had previously been in residential hospicebut now lives at home with his sonwas brought him into the emergency department by EMS from[place] when he reports thathis chronicscrotal abscess started spontaneously draining. He had been dealing with this scrotal abscess for several weeks. He has been followed by his outpatient providers and had been taking antibiotics for this. He also has been bedbound for the past several years. 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 He has severe protein calorie malnutrition. He is chronically dehydrated. He has paroxysmal atrial fibrillation but is not anticoagulated due to history of severe bleeding. He has [name] cardiac pacemaker in place. He has had multiple admissions for sepsis and hypotension.  The patient is resting comfortably. No new complaints.  Objective   Vitals:  Vitals:   07/30/18 0611 07/30/18 1101  BP: 122/86 123/85  Pulse: 96 (!) 107  Resp: 13   Temp: 98.1 F (36.7 C) (!) 97.4 F (36.3 C)  SpO2: 97% 99%    Exam:  Constitutional:  . The patient is awake and alert. He is in no acute distress. Respiratory:  . No wheezes, rales, or rhonchi.  . No tactile fremitus.  . No increased work of breathing. Cardiovascular:  . Regular rate and rhythm. No murmur, ectopy or gallups. . No LE extremity edema   . Diminished distal pulses Abdomen:  . Abdomen  appears normal; no tenderness or masses . No hernias,  . No HSM Musculoskeletal:  . No cyanosis clubbing or edema Skin:  . No rashes, lesions, ulcers, with exception to the patient's scrotal/penile abscess . palpation of skin: no induration or nodules Neurologic:  . CN 2-12 intact . Sensation all 4 extremities intact  I have personally reviewed the following:   Today's Data   CBC Latest Ref Rng & Units 07/30/2018 07/28/2018 12/04/2017  WBC 4.0 - 10.5 K/uL 8.5 15.4(H) 7.2  Hemoglobin 13.0 - 17.0 g/dL 10.0(L) 11.4(L) 13.7  Hematocrit 39.0 - 52.0 % 32.8(L) 36.4(L) 40.9  Platelets 150 - 400 K/uL 263 333 261   BMP Latest Ref Rng & Units 07/30/2018 07/28/2018 12/04/2017  Glucose 70 - 99 mg/dL 82 803(O) 98  BUN 8 - 23 mg/dL 13 19 18   Creatinine 0.61 - 1.24 mg/dL 1.22 4.82 5.00  Sodium 135 - 145 mmol/L 131(L) 125(L) 126(L)  Potassium 3.5 - 5.1 mmol/L 4.3 4.8 5.0  Chloride 98 - 111 mmol/L 106 92(L) 92(L)  CO2 22 - 32 mmol/L 20(L) 23 25  Calcium 8.9 - 10.3 mg/dL 7.9(L) 8.3(L) 9.1    Other Data  . EKG: Atrial fibrillation with RVR  Scheduled Meds: . diltiazem  30 mg Oral Q6H  . feeding supplement (ENSURE ENLIVE)  237 mL Oral BID BM  . heparin  5,000 Units Subcutaneous Q8H  . senna-docusate  2 tablet Oral BID   Continuous Infusions: . sodium chloride 50 mL/hr at 07/29/18 1420  . ceFEPime (MAXIPIME) IV Stopped (07/30/18 0240)  . metronidazole Stopped (07/30/18 0449)  . vancomycin 1,000 mg (  07/30/18 1121)    Principal Problem:   Scrotal abscess Active Problems:   Hyponatremia   Anemia, chronic disease   PAF (paroxysmal atrial fibrillation) (HCC)   Tachycardia-bradycardia syndrome (HCC)   Cardiac pacemaker in situ   Sacral decubitus ulcer, stage IV (HCC)   Sepsis    Leukocytosis   LOS: 2 days

## 2018-07-31 LAB — CBC WITH DIFFERENTIAL/PLATELET
Abs Immature Granulocytes: 0.04 10*3/uL (ref 0.00–0.07)
BASOS PCT: 0 %
Basophils Absolute: 0 10*3/uL (ref 0.0–0.1)
EOS ABS: 0.2 10*3/uL (ref 0.0–0.5)
Eosinophils Relative: 4 %
HCT: 31.1 % — ABNORMAL LOW (ref 39.0–52.0)
Hemoglobin: 9.4 g/dL — ABNORMAL LOW (ref 13.0–17.0)
Immature Granulocytes: 1 %
Lymphocytes Relative: 12 %
Lymphs Abs: 0.8 10*3/uL (ref 0.7–4.0)
MCH: 25.4 pg — ABNORMAL LOW (ref 26.0–34.0)
MCHC: 30.2 g/dL (ref 30.0–36.0)
MCV: 84.1 fL (ref 80.0–100.0)
Monocytes Absolute: 0.5 10*3/uL (ref 0.1–1.0)
Monocytes Relative: 7 %
Neutro Abs: 5.2 10*3/uL (ref 1.7–7.7)
Neutrophils Relative %: 76 %
PLATELETS: 285 10*3/uL (ref 150–400)
RBC: 3.7 MIL/uL — ABNORMAL LOW (ref 4.22–5.81)
RDW: 18.4 % — AB (ref 11.5–15.5)
WBC: 6.9 10*3/uL (ref 4.0–10.5)
nRBC: 0 % (ref 0.0–0.2)

## 2018-07-31 LAB — COMPREHENSIVE METABOLIC PANEL
ALBUMIN: 2.2 g/dL — AB (ref 3.5–5.0)
ALT: 11 U/L (ref 0–44)
AST: 18 U/L (ref 15–41)
Alkaline Phosphatase: 56 U/L (ref 38–126)
Anion gap: 8 (ref 5–15)
BUN: 13 mg/dL (ref 8–23)
CO2: 18 mmol/L — ABNORMAL LOW (ref 22–32)
Calcium: 7.9 mg/dL — ABNORMAL LOW (ref 8.9–10.3)
Chloride: 104 mmol/L (ref 98–111)
Creatinine, Ser: 0.71 mg/dL (ref 0.61–1.24)
GFR calc Af Amer: >60 mL/min (ref >60)
GFR calc non Af Amer: >60 mL/min (ref >60)
Glucose, Bld: 90 mg/dL (ref 70–99)
Potassium: 3.9 mmol/L (ref 3.5–5.1)
Sodium: 130 mmol/L — ABNORMAL LOW (ref 135–145)
TOTAL PROTEIN: 5.1 g/dL — AB (ref 6.5–8.1)
Total Bilirubin: 0.5 mg/dL (ref 0.3–1.2)

## 2018-07-31 LAB — LACTIC ACID, PLASMA
LACTIC ACID, VENOUS: 0.8 mmol/L (ref 0.5–1.9)
Lactic Acid, Venous: 0.9 mmol/L (ref 0.5–1.9)

## 2018-07-31 LAB — MAGNESIUM: Magnesium: 1.8 mg/dL (ref 1.7–2.4)

## 2018-07-31 MED ORDER — ALBUMIN HUMAN 25 % IV SOLN
50.0000 g | Freq: Four times a day (QID) | INTRAVENOUS | Status: AC
  Administered 2018-07-31 – 2018-08-01 (×3): 50 g via INTRAVENOUS
  Filled 2018-07-31 (×2): qty 50
  Filled 2018-07-31 (×2): qty 200

## 2018-07-31 MED ORDER — SODIUM CHLORIDE 0.9 % IV BOLUS
500.0000 mL | Freq: Once | INTRAVENOUS | Status: AC
  Administered 2018-07-31: 500 mL via INTRAVENOUS

## 2018-07-31 MED ORDER — SODIUM CHLORIDE 0.9 % IV BOLUS
250.0000 mL | Freq: Once | INTRAVENOUS | Status: AC
  Administered 2018-07-31: 250 mL via INTRAVENOUS

## 2018-07-31 MED ORDER — SODIUM CHLORIDE 0.9 % IV BOLUS
1000.0000 mL | Freq: Once | INTRAVENOUS | Status: AC
  Administered 2018-07-31: 1000 mL via INTRAVENOUS

## 2018-07-31 NOTE — Progress Notes (Signed)
MD ordered albumin to get prior to leaving, but ok to transport per MD, and get in route.  Patient completed 2 bottles prior to leaving and 3rd bottle started.

## 2018-07-31 NOTE — Progress Notes (Signed)
Report called to [facility].  Patient to be held until blood pressure improves.

## 2018-07-31 NOTE — Care Management Important Message (Signed)
Important Message  Patient Details  Name: Jon [name] MRN: [id number] Date of Birth: [birth year]   Medicare Important Message Given:  Yes    Caren Macadam 07/31/2018, 12:46 PMImportant Message  Patient Details  Name: Jon [name] MRN: [id number] Date of Birth: 1931/06/27   Medicare Important Message Given:  Yes    Caren Macadam 07/31/2018, 12:46 PM

## 2018-07-31 NOTE — Progress Notes (Signed)
PT Cancellation Note  Patient Details Name: Jon [name] MRN: [id number] DOB: 01-23-1932   Cancelled Treatment:    Reason Eval/Treat Not Completed: PT screened, no needs identified, will sign off(per H&P pt has been bedbound for a couple years, RN reports this is accurate. Pt is dependent at baseline, will sign off. )  Tamala Ser PT 07/31/2018  Acute Rehabilitation Services Pager [phone] Office [phone]

## 2018-07-31 NOTE — Progress Notes (Addendum)
PROGRESS NOTE  Jon [name]:[id number]:[id number] DOB: Nov 07, 1931 DOA: 07/28/2018 PCP: Gareth MorganKnowlton, Steve, MD  HPI/Recap of past 24 hours: Jon CutterWilliam P [name] is a 85 y.o. male former smoker with a complex past medical history and who had previously been in residential hospice but now lives at home with his son was brought him into the emergency department by EMS from his home when he reports that his chronic scrotal abscess started spontaneously draining.  He had been dealing with this scrotal abscess for several weeks.  He has been followed by his outpatient providers and had been taking antibiotics for this.  He also has been bedbound for the past several years.  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  He has severe protein calorie malnutrition.  He is chronically dehydrated.  He has paroxysmal atrial fibrillation but is not anticoagulated due to history of severe bleeding.  He has a cardiac pacemaker in place.  He has had multiple admissions for sepsis and hypotension.  07/31/2018: Patient seen and examined at his bedside.  Reports pain to his scrotum prior to receiving his pain medications this morning.  Noted to be hypotensive this morning.  Will discontinue his po Cardizem and give 1 L bolus of normal saline.    Assessment/Plan: Principal Problem:   Scrotal abscess Active Problems:   Hyponatremia   Anemia, chronic disease   PAF (paroxysmal atrial fibrillation) (HCC)   Tachycardia-bradycardia syndrome (HCC)   Cardiac pacemaker in situ   Sacral decubitus ulcer, stage IV (HCC)   Sepsis    Leukocytosis  Resolving sepsis secondary to peno-scrotal abscess Presented with leukocytosis, tachypnea and tachycardia in the setting of penile scrotal abscess Sepsis physiology is resolving Afebrile with no leukocytosis  Stop IV antibiotics on 07/31/2018 Will observe off antibiotics for 24 hours Urology recommended to stop antibiotics once leukocytosis and lactic  acidosis have resolved. Lactic acid on 07/31/2018 0.8 and WBC on 07/31/2018 6.9 Will follow-up with urology outpatient  Hypotension, suspect iatrogenic Was started on p.o. Cardizem yesterday due to RVR DC Cardizem due to hypotension Maintain map above 65 Given 1 L bolus of normal saline Not on antihypertensive medications at home Closely monitor vital signs  Chronic hypervolemic hyponatremia Encourage increasing po solid food intake Presented with sodium 125 on 07/28/2018 Currently on normal saline at 50 cc/h due to poor oral intake Sodium level dropped from 131 to 130 this morning K+ normal Continue to closely monitor Repeat BMP in the morning  Neurogenic bladder Has indwelling Foley catheter Continue to monitor urine output Follow-up with urology outpatient  Non-Complex Renal Cysts Noted Follow-up with urology outpatient  Tachycardia-bradycardia syndrome-patient has indwelling pacemaker.  Paroxysmal atrial fibrillation- heart rate is currently stable.  Had an episode of RVR yesterday and was placed on Cardizem p.o. 30 mg every 6 hours.  Stop Cardizem on 07/31/2018 due to hypotension.  He is not anticoagulated because of severe bleeding in the past.  Stage IV sacral decubitus ulcers-CT scan is suggesting possible osteomyelitis.  Evaluate further when clinically stabilized.  Severe protein calorie malnutrition- dietitian consultation.  Add oral supplement Ensure.  Physical debility/ambulatory dysfunction PT to assess Fall precautions    DVT Prophylaxis: subcut heparin 3 times daily Code Status: DNR   Family Communication:  Updated son at bedside.  All questions answered to his satisfaction.  Son request patient to be transferred back to [name] due to transportation issues. Disposition Plan:  Home when hemodynamically stable   Consult: Urology   Objective:  Vitals:   07/30/18 1809 07/30/18 2124 07/31/18 0534 07/31/18 0842  BP: 136/81 94/65 95/61  (!) 77/54    Pulse:  77 77 83  Resp:  17 17 14   Temp: (!) 97.5 F (36.4 C) 97.7 F (36.5 C) 97.8 F (36.6 C) (!) 97.2 F (36.2 C)  TempSrc: Axillary Oral Oral Oral  SpO2:  98% 98% 99%  Weight:      Height:        Intake/Output Summary (Last 24 hours) at 07/31/2018 1218 Last data filed at 07/31/2018 0700 Gross per 24 hour  Intake 120 ml  Output 650 ml  Net -530 ml   Filed Weights   07/28/18 1042  Weight: 59 kg    Exam:  . General: 85 y.o. year-old male chronically ill-appearing in no acute distress.  Alert and interactive. . Cardiovascular: Regular rate and rhythm with no rubs or gallops.  No JVD or thyromegaly . Respiratory: Clear to station with no wheezes or rales.  Poor inspiratory effort. . Skin: Scrotum erythematous, dressing in place.  Severely dry skin affecting lower extremities. Marland Kitchen Psychiatry: Mood is appropriate for condition and setting   Data Reviewed: CBC: Recent Labs  Lab 07/28/18 1150 07/30/18 0634 07/31/18 0547  WBC 15.4* 8.5 6.9  NEUTROABS 13.6* 6.6 5.2  HGB 11.4* 10.0* 9.4*  HCT 36.4* 32.8* 31.1*  MCV 80.5 85.6 84.1  PLT 333 263 285   Basic Metabolic Panel: Recent Labs  Lab 07/28/18 1150 07/30/18 0634 07/31/18 0547  NA 125* 131* 130*  K 4.8 4.3 3.9  CL 92* 106 104  CO2 23 20* 18*  GLUCOSE 107* 82 90  BUN 19 13 13   CREATININE 0.68 0.68 0.71  CALCIUM 8.3* 7.9* 7.9*  MG  --  1.7 1.8   GFR: Estimated Creatinine Clearance: 57.4 mL/min (by C-G formula based on SCr of 0.71 mg/dL). Liver Function Tests: Recent Labs  Lab 07/28/18 1150 07/30/18 0634 07/31/18 0547  AST 20 14* 18  ALT 15 12 11   ALKPHOS 92 73 56  BILITOT 0.4 0.8 0.5  PROT 6.5 5.3* 5.1*  ALBUMIN 2.9* 2.5* 2.2*   No results for input(s): LIPASE, AMYLASE in the last 168 hours. No results for input(s): AMMONIA in the last 168 hours. Coagulation Profile: No results for input(s): INR, PROTIME in the last 168 hours. Cardiac Enzymes: No results for input(s): CKTOTAL, CKMB, CKMBINDEX,  TROPONINI in the last 168 hours. BNP (last 3 results) No results for input(s): PROBNP in the last 8760 hours. HbA1C: No results for input(s): HGBA1C in the last 72 hours. CBG: No results for input(s): GLUCAP in the last 168 hours. Lipid Profile: No results for input(s): CHOL, HDL, LDLCALC, TRIG, CHOLHDL, LDLDIRECT in the last 72 hours. Thyroid Function Tests: No results for input(s): TSH, T4TOTAL, FREET4, T3FREE, THYROIDAB in the last 72 hours. Anemia Panel: No results for input(s): VITAMINB12, FOLATE, FERRITIN, TIBC, IRON, RETICCTPCT in the last 72 hours. Urine analysis:    Component Value Date/Time   COLORURINE YELLOW 07/28/2018 1213   APPEARANCEUR CLOUDY (A) 07/28/2018 1213   LABSPEC 1.013 07/28/2018 1213   PHURINE 8.0 07/28/2018 1213   GLUCOSEU NEGATIVE 07/28/2018 1213   HGBUR SMALL (A) 07/28/2018 1213   BILIRUBINUR NEGATIVE 07/28/2018 1213   KETONESUR NEGATIVE 07/28/2018 1213   PROTEINUR NEGATIVE 07/28/2018 1213   UROBILINOGEN 0.2 03/14/2015 1515   NITRITE NEGATIVE 07/28/2018 1213   LEUKOCYTESUR LARGE (A) 07/28/2018 1213   Sepsis Labs: @LABRCNTIP (procalcitonin:4,lacticidven:4)  ) Recent Results (from the past 240 hour(s))  Blood  culture (routine x 2)     Status: None (Preliminary result)   Collection Time: 07/28/18 11:51 AM  Result Value Ref Range Status   Specimen Description BLOOD LEFT FOREARM  Final   Special Requests   Final    BOTTLES DRAWN AEROBIC AND ANAEROBIC Blood Culture results may not be optimal due to an inadequate volume of blood received in culture bottles   Culture   Final    NO GROWTH 3 DAYS Performed at [name], [address] [place], [place] [address]    Report Status PENDING  Incomplete  Blood culture (routine x 2)     Status: None (Preliminary result)   Collection Time: 07/28/18 12:10 PM  Result Value Ref Range Status   Specimen Description BLOOD RIGHT FOREARM  Final   Special Requests   Final    BOTTLES DRAWN AEROBIC AND ANAEROBIC  Blood Culture results may not be optimal due to an excessive volume of blood received in culture bottles   Culture   Final    NO GROWTH 3 DAYS Performed at [facility], [address] [place], [place] [address]    Report Status PENDING  Incomplete  Wound or Superficial Culture     Status: None (Preliminary result)   Collection Time: 07/28/18 12:33 PM  Result Value Ref Range Status   Specimen Description   Final    SCROTUM Performed at [facility], [address], [place], [place] [address]    Special Requests   Final    Normal Performed at [facility], [address], [place], [place] [address]    Gram Stain   Final    MODERATE WBC PRESENT, PREDOMINANTLY PMN FEW GRAM VARIABLE ROD RARE GRAM POSITIVE COCCI    Culture   Final    CULTURE REINCUBATED FOR BETTER GROWTH Performed at [facility], [address], [place], [place] [address]    Report Status PENDING  Incomplete  MRSA PCR Screening     Status: None   Collection Time: 07/29/18 11:05 AM  Result Value Ref Range Status   MRSA by PCR NEGATIVE NEGATIVE Final    Comment:        The GeneXpert MRSA Assay (FDA approved for NASAL specimens only), is one component of a comprehensive MRSA colonization surveillance program. It is not intended to diagnose MRSA infection nor to guide or monitor treatment for MRSA infections. Performed at [facility], [address] [place], [place] [address]       Studies: No results found.  Scheduled Meds: . feeding supplement (ENSURE ENLIVE)  237 mL Oral BID BM  . heparin  5,000 Units Subcutaneous Q8H  . senna-docusate  2 tablet Oral BID    Continuous Infusions: . sodium chloride 50 mL/hr at 07/29/18 1420  . ceFEPime (MAXIPIME) IV 1 g (07/30/18 2344)  . metronidazole 500 mg (07/31/18 1125)  . sodium chloride    . sodium chloride    . vancomycin 1,000 mg (07/31/18 0936)     LOS: 3 days     Darlin Drop, MD [facility] Pager  [phone]  If 7PM-7AM, please contact night-coverage [email or web address] Password [facility] 07/31/2018, 12:18 PM

## 2018-08-01 DIAGNOSIS — I480 Paroxysmal atrial fibrillation: Secondary | ICD-10-CM

## 2018-08-01 MED ORDER — POLYVINYL ALCOHOL 1.4 % OP SOLN: 1.0000 [drp] | Freq: Every day | OPHTHALMIC | Status: DC | PRN

## 2018-08-01 MED ORDER — NYSTATIN 100000 UNIT/GM EX POWD
Freq: Two times a day (BID) | CUTANEOUS | Status: DC
  Administered 2018-08-01 – 2018-08-02 (×3): via TOPICAL
  Filled 2018-08-01 (×2): qty 15

## 2018-08-01 MED ORDER — ACETAMINOPHEN 500 MG PO TABS: 500.0000 mg | ORAL_TABLET | Freq: Four times a day (QID) | ORAL | Status: DC | PRN

## 2018-08-01 MED ORDER — ALPRAZOLAM 0.5 MG PO TABS: 0.5000 mg | ORAL_TABLET | Freq: Three times a day (TID) | ORAL | Status: DC | PRN

## 2018-08-01 MED ORDER — FUROSEMIDE 10 MG/ML IJ SOLN
20.0000 mg | Freq: Once | INTRAMUSCULAR | Status: AC
  Administered 2018-08-01: 20 mg via INTRAVENOUS
  Filled 2018-08-01: qty 2

## 2018-08-01 NOTE — Progress Notes (Signed)
PROGRESS NOTE  KAYMAN STOLZENBURG IDH:[id number] DOB: Dec 01, 1931 DOA: 07/28/2018 PCP: Gareth Morgan, MD  HPI/Recap of past 24 hours: Jon [name] is a 85 y.o. male former smoker with a complex past medical history and who had previously been in residential hospice but now lives at home with his son was brought him into the emergency department by EMS from his home when he reports that his chronic scrotal abscess started spontaneously draining.  He had been dealing with this scrotal abscess for several weeks.  He has been followed by his outpatient providers and had been taking antibiotics for this.  He also has been bedbound for the past several years.  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  He has severe protein calorie malnutrition.  He is chronically dehydrated.  He has paroxysmal atrial fibrillation but is not anticoagulated due to history of severe bleeding.  He has a cardiac pacemaker in place.  He has had multiple admissions for sepsis and hypotension.  Assessment/Plan: Principal Problem:   Scrotal abscess Active Problems:   Hyponatremia   Anemia, chronic disease   PAF (paroxysmal atrial fibrillation) (HCC)   Tachycardia-bradycardia syndrome (HCC)   Cardiac pacemaker in situ   Sacral decubitus ulcer, stage IV (HCC)   Sepsis    Leukocytosis  Resolving sepsis secondary to peno-scrotal abscess Presented with leukocytosis, tachypnea and tachycardia in the setting of penile scrotal abscess Sepsis physiology is resolving Afebrile with no leukocytosis  Stop IV antibiotics on 07/31/2018 Will observe off antibiotics for 24 hours Urology recommended to stop antibiotics once leukocytosis and lactic acidosis have resolved. Lactic acid on 07/31/2018 0.8 and WBC on 07/31/2018 6.9 Will follow-up with urology outpatient  Chronic hypervolemic hyponatremia Hypervolemic hyponatremia.  May need to start diuresis.  Recheck in a.m.  Neurogenic  bladder Has indwelling Foley catheter Continue to monitor urine output Follow-up with urology outpatient  Non-Complex Renal Cysts Noted Follow-up with urology outpatient  Tachycardia-bradycardia syndrome- status post pacemaker  Paroxysmal atrial fibrillation- heart rate is currently stable.  Was briefly on Cardizem for rapid ventricular response, but became hypotensive.  Diarrhea control is been discontinued.  Continue to monitor heart rate.  Not on anticoagulation due to history of bleeding  Stage IV sacral decubitus ulcers-CT scan is suggesting possible osteomyelitis.    Unable to evaluate with MRI due to presence of pacemaker.  Evaluate further when clinically stabilized.  Severe protein calorie malnutrition- dietitian consultation.  Add oral supplement Ensure.  Physical debility/ambulatory dysfunction PT to assess Fall precautions    DVT Prophylaxis: subcut heparin 3 times daily Code Status: DNR   Family Communication:  Updated son at the bedside Disposition Plan:  Home when hemodynamically stable   Consult: Urology  Subjective: Denies any shortness of breath.  Has pain in his back and neck.  Denies any chest pain.  Objective: Vitals:   07/31/18 2026 07/31/18 2102 08/01/18 1348 08/01/18 1405  BP:  (!) 85/58 104/76 112/87  Pulse:  71 79 77  Resp:  15 19 18   Temp:  98.1 F (36.7 C)  97.7 F (36.5 C)  TempSrc:  Oral  Oral  SpO2: 96% 100% 100% 99%  Weight:      Height:        Intake/Output Summary (Last 24 hours) at 08/01/2018 1935 Last data filed at 08/01/2018 1700 Gross per 24 hour  Intake 2129.73 ml  Output 600 ml  Net 1529.73 ml   Filed Weights   07/28/18 1042  Weight: 59  kg    Exam:  General exam: Alert, awake, oriented x 3 Respiratory system: Clear to auscultation. Respiratory effort normal. Cardiovascular system:RRR. No murmurs, rubs, gallops. Gastrointestinal system: Abdomen is nondistended, soft and nontender. No organomegaly or masses felt.  Normal bowel sounds heard.  Scrotum with packing in place Central nervous system:  No focal neurological deficits. Extremities: Widespread anasarca Skin: No rashes, lesions or ulcers . Psychiatry: Confused, pleasant.  .    Data Reviewed: CBC: Recent Labs  Lab 07/28/18 1150 07/30/18 0634 07/31/18 0547  WBC 15.4* 8.5 6.9  NEUTROABS 13.6* 6.6 5.2  HGB 11.4* 10.0* 9.4*  HCT 36.4* 32.8* 31.1*  MCV 80.5 85.6 84.1  PLT 333 263 285   Basic Metabolic Panel: Recent Labs  Lab 07/28/18 1150 07/30/18 0634 07/31/18 0547  NA 125* 131* 130*  K 4.8 4.3 3.9  CL 92* 106 104  CO2 23 20* 18*  GLUCOSE 107* 82 90  BUN 19 13 13   CREATININE 0.68 0.68 0.71  CALCIUM 8.3* 7.9* 7.9*  MG  --  1.7 1.8   GFR: Estimated Creatinine Clearance: 57.4 mL/min (by C-G formula based on SCr of 0.71 mg/dL). Liver Function Tests: Recent Labs  Lab 07/28/18 1150 07/30/18 0634 07/31/18 0547  AST 20 14* 18  ALT 15 12 11   ALKPHOS 92 73 56  BILITOT 0.4 0.8 0.5  PROT 6.5 5.3* 5.1*  ALBUMIN 2.9* 2.5* 2.2*   No results for input(s): LIPASE, AMYLASE in the last 168 hours. No results for input(s): AMMONIA in the last 168 hours. Coagulation Profile: No results for input(s): INR, PROTIME in the last 168 hours. Cardiac Enzymes: No results for input(s): CKTOTAL, CKMB, CKMBINDEX, TROPONINI in the last 168 hours. BNP (last 3 results) No results for input(s): PROBNP in the last 8760 hours. HbA1C: No results for input(s): HGBA1C in the last 72 hours. CBG: No results for input(s): GLUCAP in the last 168 hours. Lipid Profile: No results for input(s): CHOL, HDL, LDLCALC, TRIG, CHOLHDL, LDLDIRECT in the last 72 hours. Thyroid Function Tests: No results for input(s): TSH, T4TOTAL, FREET4, T3FREE, THYROIDAB in the last 72 hours. Anemia Panel: No results for input(s): VITAMINB12, FOLATE, FERRITIN, TIBC, IRON, RETICCTPCT in the last 72 hours. Urine analysis:    Component Value Date/Time   COLORURINE YELLOW  07/28/2018 1213   APPEARANCEUR CLOUDY (A) 07/28/2018 1213   LABSPEC 1.013 07/28/2018 1213   PHURINE 8.0 07/28/2018 1213   GLUCOSEU NEGATIVE 07/28/2018 1213   HGBUR SMALL (A) 07/28/2018 1213   BILIRUBINUR NEGATIVE 07/28/2018 1213   KETONESUR NEGATIVE 07/28/2018 1213   PROTEINUR NEGATIVE 07/28/2018 1213   UROBILINOGEN 0.2 03/14/2015 1515   NITRITE NEGATIVE 07/28/2018 1213   LEUKOCYTESUR LARGE (A) 07/28/2018 1213   Sepsis Labs: @LABRCNTIP (procalcitonin:4,lacticidven:4)  ) Recent Results (from the past 240 hour(s))  Blood culture (routine x 2)     Status: None (Preliminary result)   Collection Time: 07/28/18 11:51 AM  Result Value Ref Range Status   Specimen Description BLOOD LEFT FOREARM  Final   Special Requests   Final    BOTTLES DRAWN AEROBIC AND ANAEROBIC Blood Culture results may not be optimal due to an inadequate volume of blood received in culture bottles   Culture   Final    NO GROWTH 4 DAYS Performed at [name], [address] [place], [place] [id number]    Report Status PENDING  Incomplete  Blood culture (routine x 2)     Status: None (Preliminary result)   Collection Time: 07/28/18 12:10 PM  Result Value Ref Range Status   Specimen Description BLOOD RIGHT FOREARM  Final   Special Requests   Final    BOTTLES DRAWN AEROBIC AND ANAEROBIC Blood Culture results may not be optimal due to an excessive volume of blood received in culture bottles   Culture   Final    NO GROWTH 4 DAYS Performed at [facility], [address] [place], [place] [address]    Report Status PENDING  Incomplete  Wound or Superficial Culture     Status: None (Preliminary result)   Collection Time: 07/28/18 12:33 PM  Result Value Ref Range Status   Specimen Description   Final    SCROTUM Performed at [facility], [address], [place], [place] [address]    Special Requests   Final    Normal Performed at [facility], [address] [place], [place] [address]    Gram Stain   Final     MODERATE WBC PRESENT, PREDOMINANTLY PMN FEW GRAM VARIABLE ROD RARE GRAM POSITIVE COCCI    Culture   Final    FEW PSEUDOMONAS AERUGINOSA CULTURE REINCUBATED FOR BETTER GROWTH Performed at [facility], [address], [place], [place] [address]    Report Status PENDING  Incomplete  MRSA PCR Screening     Status: None   Collection Time: 07/29/18 11:05 AM  Result Value Ref Range Status   MRSA by PCR NEGATIVE NEGATIVE Final    Comment:        The GeneXpert MRSA Assay (FDA approved for NASAL specimens only), is one component of a comprehensive MRSA colonization surveillance program. It is not intended to diagnose MRSA infection nor to guide or monitor treatment for MRSA infections. Performed at [facility], [address] [place], [place] [address]       Studies: No results found.  Scheduled Meds: . feeding supplement (ENSURE ENLIVE)  237 mL Oral BID BM  . heparin  5,000 Units Subcutaneous Q8H  . nystatin   Topical BID  . senna-docusate  2 tablet Oral BID    Continuous Infusions: . sodium chloride 75 mL/hr at 08/01/18 0500     LOS: 4 days     Erick Blinks, MD [facility] Pager [phone]  If 7PM-7AM, please contact night-coverage [email or web address]  08/01/2018, 7:35 PM

## 2018-08-02 LAB — CULTURE, BLOOD (ROUTINE X 2)
Culture: NO GROWTH
Culture: NO GROWTH

## 2018-08-02 LAB — BASIC METABOLIC PANEL
Anion gap: 8 (ref 5–15)
BUN: 13 mg/dL (ref 8–23)
CO2: 19 mmol/L — ABNORMAL LOW (ref 22–32)
Calcium: 8.3 mg/dL — ABNORMAL LOW (ref 8.9–10.3)
Chloride: 107 mmol/L (ref 98–111)
Creatinine, Ser: 0.73 mg/dL (ref 0.61–1.24)
GFR calc Af Amer: >60 mL/min (ref >60)
GFR calc non Af Amer: >60 mL/min (ref >60)
Glucose, Bld: 94 mg/dL (ref 70–99)
Potassium: 3.5 mmol/L (ref 3.5–5.1)
SODIUM: 134 mmol/L — AB (ref 135–145)

## 2018-08-02 LAB — AEROBIC CULTURE  (SUPERFICIAL SPECIMEN)

## 2018-08-02 LAB — AEROBIC CULTURE W GRAM STAIN (SUPERFICIAL SPECIMEN): Special Requests: NORMAL

## 2018-08-02 MED ORDER — FUROSEMIDE 40 MG PO TABS: 40.0000 mg | ORAL_TABLET | Freq: Every day | ORAL | 0 refills | Status: DC

## 2018-08-02 MED ORDER — METOPROLOL TARTRATE 25 MG PO TABS: 12.5000 mg | ORAL_TABLET | Freq: Two times a day (BID) | ORAL | 0 refills | Status: DC

## 2018-08-02 MED ORDER — POTASSIUM CHLORIDE ER 20 MEQ PO TBCR: 40.0000 meq | EXTENDED_RELEASE_TABLET | Freq: Every day | ORAL | 0 refills | Status: DC

## 2018-08-02 MED ORDER — FUROSEMIDE 10 MG/ML IJ SOLN
20.0000 mg | Freq: Once | INTRAMUSCULAR | Status: AC
  Administered 2018-08-02: 20 mg via INTRAVENOUS
  Filled 2018-08-02: qty 2

## 2018-08-02 MED ORDER — METOPROLOL TARTRATE 25 MG PO TABS
12.5000 mg | ORAL_TABLET | Freq: Two times a day (BID) | ORAL | Status: DC
  Administered 2018-08-02: 12.5 mg via ORAL
  Filled 2018-08-02: qty 1

## 2018-08-02 NOTE — Discharge Summary (Signed)
Physician Discharge Summary  [name] Fedie UJW:[id number]:[id number] DOB: 12/01/1931 DOA: 07/28/2018  PCP: Gareth MorganKnowlton, Steve, MD  Admit date: 07/28/2018 Discharge date: 08/02/2018  Admitted From: Home Disposition: Home  Recommendations for Outpatient Follow-up:  1. Follow up with PCP in 1-2 weeks 2. Please obtain BMP/CBC in one week 3. Follow-up with urology 2 weeks  Home Health: Home health RN Equipment/Devices: Wound care for scrotal abscess.  Will need wet-to-dry packing daily until healed by secondary intention  Discharge Condition: Stable CODE STATUS: DNR Diet recommendation: Heart healthy  Brief/Interim Summary: Chrissie NoaWilliam P Lamberthis a 85 y.o.maleformer smoker with a complex past medical history and who had previously been in residential hospicebut now lives at home with his son,was brought into the emergency department by EMS [facility] when he reports thata scrotal abscess started spontaneously draining. He had been dealing with this scrotal abscess for several weeks. He has been followed by his outpatient providers and had been taking antibiotics for this. He also has been bedbound for the past several years. He has severe sacral decubitus ulcers. He has severe protein calorie malnutrition. He is chronically debilitated. He has paroxysmal atrial fibrillation but is not anticoagulated due to history of severe bleeding. He has a cardiac pacemaker in place. He has had multiple admissions for sepsis and hypotension.  Discharge Diagnoses:  Principal Problem:   Scrotal abscess Active Problems:   Hyponatremia   Anemia, chronic disease   PAF (paroxysmal atrial fibrillation) (HCC)   Tachycardia-bradycardia syndrome (HCC)   Cardiac pacemaker in situ   Sacral decubitus ulcer, stage IV (HCC)   Sepsis    Leukocytosis  1. Sepsis secondary to peno-scrotal abscess.  Presented with significant leukocytosis, tachycardia and elevated lactic acid.  He was started on intravenous  antibiotics.  He was seen by urology and by then, abscess had began to spontaneously drain.  No further debridement/incision was felt necessary.  Patient WBC count has normalized.  Lactic acid is also normal.  No further antibiotics have been recommended by urology.  He is currently afebrile.  He will need continued wet-to-dry packing to let wound heal by secondary intent and has been set up with home health RN. 2. Chronic hypervolemic hyponatremia.  Improving with Lasix. 3. Severe protein calorie malnutrition with anasarca.  Started on low-dose Lasix.  Family has concerns that he may develop dehydration since he also has high output from his ostomy at times.  He will be discharged on a 5-day course of Lasix.  This can be further extended out as necessary. 4. Paroxysmal atrial fibrillation.  Currently on low-dose metoprolol with fair rate control.  He is not on anticoagulation due to history of bleeding. 5. Tachybradycardia syndrome.  Status post pacemaker. 6. Complex renal cyst.  Continue follow-up with urology. 7. Neurogenic bladder.  He has chronic indwelling Foley catheter.  Follow-up with urology. 8. Stage III sacral decubitus ulcers.  Review of records indicate that these have been present since 05/2017.  Clinically, there is does not appear to be evidence of active infection.  Continue to monitor. 9. Physical debility/generalized weakness.  Patient is chronically bedbound.  He was seen by physical therapy who felt that he was at his functional baseline.  Discussed with son who is agreement to take the patient home.  EMS has been arranged to transport the patient home.  Discharge Instructions  Discharge Instructions    Diet - low sodium heart healthy   Complete by:  As directed    Increase activity slowly   Complete by:  As directed      Allergies as of 08/02/2018      Reactions   Iohexol     Desc: PT STATES THROAT/FACE SWELLING W/ IVP DYE IN 1990. PT HAS NOT HAD ANY EXAMS DONE W/DYE SINCE  AND HAS NEVER BEEN PRE MEDICATED. PER PT, Onset Date: [id number]   Shellfish Allergy    Dilantin [phenytoin Sodium Extended] Rash   Tegretol [carbamazepine] Rash      Medication List    STOP taking these medications   amoxicillin-clavulanate 500-125 MG tablet Commonly known as:  AUGMENTIN   fluconazole 100 MG tablet Commonly known as:  DIFLUCAN     TAKE these medications   acetaminophen 500 MG tablet Commonly known as:  TYLENOL Take 500 mg by mouth every 6 (six) hours as needed for mild pain.   ALPRAZolam 1 MG tablet Commonly known as:  XANAX Take 0.5 tablets (0.5 mg total) by mouth 2 (two) times daily as needed for anxiety. What changed:  when to take this   EYE DROPS OP Apply 2 drops to eye daily as needed (irritation).   furosemide 40 MG tablet Commonly known as:  LASIX Take 1 tablet (40 mg total) by mouth daily.   metoprolol tartrate 25 MG tablet Commonly known as:  LOPRESSOR Take 0.5 tablets (12.5 mg total) by mouth 2 (two) times daily.   nystatin powder Commonly known as:  MYCOSTATIN/NYSTOP Apply topically 2 (two) times daily.   Potassium Chloride ER 20 MEQ Tbcr Take 40 mEq by mouth daily.       Allergies  Allergen Reactions  . Iohexol      Desc: PT STATES THROAT/FACE SWELLING W/ IVP DYE IN 1990. PT HAS NOT HAD ANY EXAMS DONE W/DYE SINCE AND HAS NEVER BEEN PRE MEDICATED. PER PT, Onset Date: [id number]   . Shellfish Allergy   . Dilantin [Phenytoin Sodium Extended] Rash  . Tegretol [Carbamazepine] Rash    Consultations:  Urology   Procedures/Studies: Ct Abdomen Pelvis Wo Contrast  Result Date: 07/28/2018 CLINICAL DATA:  Scrotal abscess, swelling, pain, drainage EXAM: CT ABDOMEN AND PELVIS WITHOUT CONTRAST TECHNIQUE: Multidetector CT imaging of the abdomen and pelvis was performed following the standard protocol without IV contrast. COMPARISON:  CT abdomen pelvis, 08/18/2016 FINDINGS: Lower chest: Small right pleural effusion. Bibasilar scarring or  atelectasis. Hepatobiliary: No focal liver abnormality is seen. Gallstones. No wall thickening, or biliary dilatation. Pancreas: Unremarkable. No pancreatic ductal dilatation or surrounding inflammatory changes. Spleen: Normal in size without focal abnormality. Adrenals/Urinary Tract: Adrenal glands are unremarkable. There is a redemonstrated, extremely large exophytic cyst of the right kidney measuring at least 24 cm, slightly increased in size compared to prior examination (series 6, image 39). There are multiple right renal calculi including a staghorn calculus of the right renal pelvis. Numerous bladder calculi in the dependent urinary bladder. Foley catheter and multiple diverticula of the thickened bladder. Prostatomegaly. Stomach/Bowel: Stomach is within normal limits. No evidence of bowel wall thickening, distention, or inflammatory changes. Status post sigmoid resection and left lower quadrant ostomy. Vascular/Lymphatic: Aortic atherosclerosis. No enlarged abdominal or pelvic lymph nodes. Reproductive: No mass or other abnormality. Other: No abdominal wall hernia or abnormality. No abdominopelvic ascites. Scrotal edema without discrete fluid collection appreciated. Musculoskeletal: Sacral and right ischial decubitus ulcers with underlying osseous sclerosis (series 3, image 60) concerning for osteomyelitis. IMPRESSION: 1. Scrotal edema without discrete fluid collection appreciated. 2. Sacral and right ischial decubitus ulcers with underlying osseous sclerosis (series 3, image 60) concerning for osteomyelitis. 3. Multiple  chronic and incidental findings of the urinary tract: There is a redemonstrated, extremely large exophytic cyst of the right kidney measuring at least 24 cm, slightly increased in size compared to prior examination (series 6, image 39). There are multiple right renal calculi including a staghorn calculus of the right renal pelvis. Numerous bladder calculi in the dependent urinary bladder.  Foley catheter and multiple diverticula of the thickened bladder. Prostatomegaly. 4.  Status post sigmoid resection and left lower quadrant ostomy. 5.  Other chronic and incidental findings as detailed above. Electronically Signed   By: Lauralyn Primes M.D.   On: 07/28/2018 13:40   Dg Abd 1 View  Result Date: 07/30/2018 CLINICAL DATA:  Vomiting EXAM: ABDOMEN - 1 VIEW COMPARISON:  CT abdomen and pelvis 07/28/2018 FINDINGS: Nonobstructive bowel gas pattern. Mild gaseous distention of transverse colon. Stool throughout descending colon to ostomy in LEFT lower quadrant. Air-filled nondistended loops of small bowel. Multiple calcified gallstones RIGHT upper quadrant. Multiple calculi project over the central RIGHT kidney and RIGHT renal pelvis. Bones demineralized with degenerative disc and facet disease changes of the lumbar spine. IMPRESSION: Cholelithiasis. RIGHT renal calculi. Nonobstructive bowel gas pattern. Electronically Signed   By: Ulyses Southward M.D.   On: 07/30/2018 13:13       Subjective: Denies any scrotal pain.  Does complain of pain in his neck, but feels is related to the bed.  Denies any shortness of breath.  Discharge Exam: Vitals:   08/01/18 1405 08/01/18 2100 08/02/18 0534 08/02/18 1524  BP: 112/87 120/81 (!) 129/94 117/81  Pulse: 77 76 (!) 105 80  Resp: 18 18 18 16   Temp: 97.7 F (36.5 C) 97.6 F (36.4 C) 98.2 F (36.8 C) 97.8 F (36.6 C)  TempSrc: Oral Oral Oral Oral  SpO2: 99% 100% 98% 99%  Weight:      Height:        General: Pt is alert, awake, not in acute distress Cardiovascular: RRR, S1/S2 +, no rubs, no gallops Respiratory: CTA bilaterally, no wheezing, no rhonchi Abdominal: Soft, NT, ND, bowel sounds + ostomy in left lower quadrant.  Brown stool in bag. Extremities: Anasarca present in upper and lower extremities Skin: Decubitus ulcers noted in sacral area without any drainage, or surrounding erythema    The results of significant diagnostics from this  hospitalization (including imaging, microbiology, ancillary and laboratory) are listed below for reference.     Microbiology: Recent Results (from the past 240 hour(s))  Blood culture (routine x 2)     Status: None   Collection Time: 07/28/18 11:51 AM  Result Value Ref Range Status   Specimen Description BLOOD LEFT FOREARM  Final   Special Requests   Final    BOTTLES DRAWN AEROBIC AND ANAEROBIC Blood Culture results may not be optimal due to an inadequate volume of blood received in culture bottles   Culture   Final    NO GROWTH 5 DAYS Performed at [facility], [address], [place], [place] [address]    Report Status 08/02/2018 FINAL  Final  Blood culture (routine x 2)     Status: None   Collection Time: 07/28/18 12:10 PM  Result Value Ref Range Status   Specimen Description BLOOD RIGHT FOREARM  Final   Special Requests   Final    BOTTLES DRAWN AEROBIC AND ANAEROBIC Blood Culture results may not be optimal due to an excessive volume of blood received in culture bottles   Culture   Final    NO GROWTH 5  DAYS Performed at [facility], [address], [place], [place] [address]    Report Status 08/02/2018 FINAL  Final  Wound or Superficial Culture     Status: None   Collection Time: 07/28/18 12:33 PM  Result Value Ref Range Status   Specimen Description   Final    SCROTUM Performed at [facility], [address] [place], [place] [address]    Special Requests   Final    Normal Performed at [facility], [address] [place], [place] [address]    Gram Stain   Final    MODERATE WBC PRESENT, PREDOMINANTLY PMN FEW GRAM VARIABLE ROD RARE GRAM POSITIVE COCCI    Culture   Final    FEW PSEUDOMONAS AERUGINOSA WITH IN MIXED CULTURE Performed at [facility], [address], [place], [place] [address]    Report Status 08/02/2018 FINAL  Final   Organism ID, Bacteria PSEUDOMONAS AERUGINOSA  Final      Susceptibility   Pseudomonas aeruginosa - MIC*    CEFTAZIDIME 2  SENSITIVE Sensitive     CIPROFLOXACIN 2 INTERMEDIATE Intermediate     GENTAMICIN <=1 SENSITIVE Sensitive     IMIPENEM 2 SENSITIVE Sensitive     PIP/TAZO 8 SENSITIVE Sensitive     CEFEPIME 2 SENSITIVE Sensitive     * FEW PSEUDOMONAS AERUGINOSA  MRSA PCR Screening     Status: None   Collection Time: 07/29/18 11:05 AM  Result Value Ref Range Status   MRSA by PCR NEGATIVE NEGATIVE Final    Comment:        The GeneXpert MRSA Assay (FDA approved for NASAL specimens only), is one component of a comprehensive MRSA colonization surveillance program. It is not intended to diagnose MRSA infection nor to guide or monitor treatment for MRSA infections. Performed at [facility], [address] [place], [place] [address]      Labs: BNP (last 3 results) No results for input(s): BNP in the last 8760 hours. Basic Metabolic Panel: Recent Labs  Lab 07/28/18 1150 07/30/18 0634 07/31/18 0547 08/02/18 0623  NA 125* 131* 130* 134*  K 4.8 4.3 3.9 3.5  CL 92* 106 104 107  CO2 23 20* 18* 19*  GLUCOSE 107* 82 90 94  BUN 19 13 13 13   CREATININE 0.68 0.68 0.71 0.73  CALCIUM 8.3* 7.9* 7.9* 8.3*  MG  --  1.7 1.8  --    Liver Function Tests: Recent Labs  Lab 07/28/18 1150 07/30/18 0634 07/31/18 0547  AST 20 14* 18  ALT 15 12 11   ALKPHOS 92 73 56  BILITOT 0.4 0.8 0.5  PROT 6.5 5.3* 5.1*  ALBUMIN 2.9* 2.5* 2.2*   No results for input(s): LIPASE, AMYLASE in the last 168 hours. No results for input(s): AMMONIA in the last 168 hours. CBC: Recent Labs  Lab 07/28/18 1150 07/30/18 0634 07/31/18 0547  WBC 15.4* 8.5 6.9  NEUTROABS 13.6* 6.6 5.2  HGB 11.4* 10.0* 9.4*  HCT 36.4* 32.8* 31.1*  MCV 80.5 85.6 84.1  PLT 333 263 285   Cardiac Enzymes: No results for input(s): CKTOTAL, CKMB, CKMBINDEX, TROPONINI in the last 168 hours. BNP: Invalid input(s): POCBNP CBG: No results for input(s): GLUCAP in the last 168 hours. D-Dimer No results for input(s): DDIMER in  the last 72 hours. Hgb A1c No results for input(s): HGBA1C in the last 72 hours. Lipid Profile No results for input(s): CHOL, HDL, LDLCALC, TRIG, CHOLHDL, LDLDIRECT in the last 72 hours. Thyroid function studies  No results for input(s): TSH, T4TOTAL, T3FREE, THYROIDAB in the last 72 hours.  Invalid input(s): FREET3 Anemia work up No results for input(s): VITAMINB12, FOLATE, FERRITIN, TIBC, IRON, RETICCTPCT in the last 72 hours. Urinalysis    Component Value Date/Time   COLORURINE YELLOW 07/28/2018 1213   APPEARANCEUR CLOUDY (A) 07/28/2018 1213   LABSPEC 1.013 07/28/2018 1213   PHURINE 8.0 07/28/2018 1213   GLUCOSEU NEGATIVE 07/28/2018 1213   HGBUR SMALL (A) 07/28/2018 1213   BILIRUBINUR NEGATIVE 07/28/2018 1213   KETONESUR NEGATIVE 07/28/2018 1213   PROTEINUR NEGATIVE 07/28/2018 1213   UROBILINOGEN 0.2 03/14/2015 1515   NITRITE NEGATIVE 07/28/2018 1213   LEUKOCYTESUR LARGE (A) 07/28/2018 1213   Sepsis Labs Invalid input(s): PROCALCITONIN,  WBC,  LACTICIDVEN Microbiology Recent Results (from the past 240 hour(s))  Blood culture (routine x 2)     Status: None   Collection Time: 07/28/18 11:51 AM  Result Value Ref Range Status   Specimen Description BLOOD LEFT FOREARM  Final   Special Requests   Final    BOTTLES DRAWN AEROBIC AND ANAEROBIC Blood Culture results may not be optimal due to an inadequate volume of blood received in culture bottles   Culture   Final    NO GROWTH 5 DAYS Performed at [facility], [address] [place], [place] [address]    Report Status 08/02/2018 FINAL  Final  Blood culture (routine x 2)     Status: None   Collection Time: 07/28/18 12:10 PM  Result Value Ref Range Status   Specimen Description BLOOD RIGHT FOREARM  Final   Special Requests   Final    BOTTLES DRAWN AEROBIC AND ANAEROBIC Blood Culture results may not be optimal due to an excessive volume of blood received in culture bottles   Culture   Final    NO GROWTH 5 DAYS Performed at  [facility], [address] [place], [place] [address]    Report Status 08/02/2018 FINAL  Final  Wound or Superficial Culture     Status: None   Collection Time: 07/28/18 12:33 PM  Result Value Ref Range Status   Specimen Description   Final    SCROTUM Performed at [facility], [address] [place], [place] [address]    Special Requests   Final    Normal Performed at [facility] [address] [place], [place] [address]    Gram Stain   Final    MODERATE WBC PRESENT, PREDOMINANTLY PMN FEW GRAM VARIABLE ROD RARE GRAM POSITIVE COCCI    Culture   Final    FEW PSEUDOMONAS AERUGINOSA WITH IN MIXED CULTURE Performed at [facility], [address] [place], [place] [address]    Report Status 08/02/2018 FINAL  Final   Organism ID, Bacteria PSEUDOMONAS AERUGINOSA  Final      Susceptibility   Pseudomonas aeruginosa - MIC*    CEFTAZIDIME 2 SENSITIVE Sensitive     CIPROFLOXACIN 2 INTERMEDIATE Intermediate     GENTAMICIN <=1 SENSITIVE Sensitive     IMIPENEM 2 SENSITIVE Sensitive     PIP/TAZO 8 SENSITIVE Sensitive     CEFEPIME 2 SENSITIVE Sensitive     * FEW PSEUDOMONAS AERUGINOSA  MRSA PCR Screening     Status: None   Collection Time: 07/29/18 11:05 AM  Result Value Ref Range Status   MRSA by PCR NEGATIVE NEGATIVE Final    Comment:        The GeneXpert MRSA Assay (FDA approved for NASAL specimens only), is one component  of a comprehensive MRSA colonization surveillance program. It is not intended to diagnose MRSA infection nor to guide or monitor treatment for MRSA infections. Performed at [facility], [address] [place], [place] [address]      Time coordinating discharge:  SIGNED:   Erick Blinks, MD  [facility] 08/02/2018, 4:20 PM   If 7PM-7AM, please contact night-coverage [email or web address]

## 2018-08-02 NOTE — Progress Notes (Signed)
Pt to go home and resume HH with [facility], however when I called to verify [facility] services, Erline Levine of [facility] said they would not be taking Mr. Hershkowitz back due to staffing.  Contacted pt's POA, Violet Baldy [phone]) and he states that Mr. Axe's son Tawanna Cooler lives with Mr. Casini and is capable of caring for him.  Will notify case management of HH need.  Pt to travel home via EMS this evening.

## 2018-08-03 NOTE — Care Management (Signed)
CM received msg from weekend CM. [facility] unable to accept pt back onto services d/t staffing. Referral sent to [facility]. Son, Tawanna Cooler, aware of referral.

## 2018-11-23 ENCOUNTER — Other Ambulatory Visit: Payer: Self-pay

## 2018-11-23 ENCOUNTER — Emergency Department (HOSPITAL_COMMUNITY): Payer: Medicare Other

## 2018-11-23 ENCOUNTER — Encounter (HOSPITAL_COMMUNITY): Payer: Self-pay | Admitting: Emergency Medicine

## 2018-11-23 ENCOUNTER — Inpatient Hospital Stay (HOSPITAL_COMMUNITY)
Admission: EM | Admit: 2018-11-23 | Discharge: 2018-11-27 | DRG: 981 | Disposition: A | Discharge status: Home Health | Payer: Medicare Other | Attending: Internal Medicine | Admitting: Internal Medicine

## 2018-11-23 DIAGNOSIS — S91105A Unspecified open wound of left lesser toe(s) without damage to nail, initial encounter: Secondary | ICD-10-CM | POA: Diagnosis not present

## 2018-11-23 DIAGNOSIS — Z91013 Allergy to seafood: Secondary | ICD-10-CM

## 2018-11-23 DIAGNOSIS — F0390 Unspecified dementia without behavioral disturbance: Secondary | ICD-10-CM | POA: Diagnosis present

## 2018-11-23 DIAGNOSIS — I495 Sick sinus syndrome: Secondary | ICD-10-CM | POA: Diagnosis present

## 2018-11-23 DIAGNOSIS — N390 Urinary tract infection, site not specified: Secondary | ICD-10-CM | POA: Diagnosis present

## 2018-11-23 DIAGNOSIS — L97519 Non-pressure chronic ulcer of other part of right foot with unspecified severity: Secondary | ICD-10-CM

## 2018-11-23 DIAGNOSIS — M869 Osteomyelitis, unspecified: Secondary | ICD-10-CM | POA: Diagnosis present

## 2018-11-23 DIAGNOSIS — Z681 Body mass index (BMI) 19 or less, adult: Secondary | ICD-10-CM

## 2018-11-23 DIAGNOSIS — I5032 Chronic diastolic (congestive) heart failure: Secondary | ICD-10-CM | POA: Diagnosis not present

## 2018-11-23 DIAGNOSIS — Z950 Presence of cardiac pacemaker: Secondary | ICD-10-CM

## 2018-11-23 DIAGNOSIS — G250 Essential tremor: Secondary | ICD-10-CM | POA: Diagnosis present

## 2018-11-23 DIAGNOSIS — G4733 Obstructive sleep apnea (adult) (pediatric): Secondary | ICD-10-CM | POA: Diagnosis present

## 2018-11-23 DIAGNOSIS — R7881 Bacteremia: Secondary | ICD-10-CM | POA: Diagnosis present

## 2018-11-23 DIAGNOSIS — T83518A Infection and inflammatory reaction due to other urinary catheter, initial encounter: Principal | ICD-10-CM | POA: Diagnosis present

## 2018-11-23 DIAGNOSIS — L98429 Non-pressure chronic ulcer of back with unspecified severity: Secondary | ICD-10-CM | POA: Diagnosis present

## 2018-11-23 DIAGNOSIS — E871 Hypo-osmolality and hyponatremia: Secondary | ICD-10-CM | POA: Diagnosis not present

## 2018-11-23 DIAGNOSIS — Z885 Allergy status to narcotic agent status: Secondary | ICD-10-CM

## 2018-11-23 DIAGNOSIS — Y846 Urinary catheterization as the cause of abnormal reaction of the patient, or of later complication, without mention of misadventure at the time of the procedure: Secondary | ICD-10-CM | POA: Diagnosis present

## 2018-11-23 DIAGNOSIS — Z87442 Personal history of urinary calculi: Secondary | ICD-10-CM

## 2018-11-23 DIAGNOSIS — Z66 Do not resuscitate: Secondary | ICD-10-CM | POA: Diagnosis present

## 2018-11-23 DIAGNOSIS — M810 Age-related osteoporosis without current pathological fracture: Secondary | ICD-10-CM | POA: Diagnosis present

## 2018-11-23 DIAGNOSIS — I110 Hypertensive heart disease with heart failure: Secondary | ICD-10-CM | POA: Diagnosis present

## 2018-11-23 DIAGNOSIS — Z933 Colostomy status: Secondary | ICD-10-CM

## 2018-11-23 DIAGNOSIS — Z79899 Other long term (current) drug therapy: Secondary | ICD-10-CM

## 2018-11-23 DIAGNOSIS — Z8249 Family history of ischemic heart disease and other diseases of the circulatory system: Secondary | ICD-10-CM

## 2018-11-23 DIAGNOSIS — N319 Neuromuscular dysfunction of bladder, unspecified: Secondary | ICD-10-CM | POA: Diagnosis present

## 2018-11-23 DIAGNOSIS — G8929 Other chronic pain: Secondary | ICD-10-CM | POA: Diagnosis present

## 2018-11-23 DIAGNOSIS — Z87891 Personal history of nicotine dependence: Secondary | ICD-10-CM

## 2018-11-23 DIAGNOSIS — E43 Unspecified severe protein-calorie malnutrition: Secondary | ICD-10-CM | POA: Diagnosis present

## 2018-11-23 DIAGNOSIS — Z20828 Contact with and (suspected) exposure to other viral communicable diseases: Secondary | ICD-10-CM | POA: Diagnosis present

## 2018-11-23 DIAGNOSIS — Z800 Family history of malignant neoplasm of digestive organs: Secondary | ICD-10-CM

## 2018-11-23 DIAGNOSIS — L97529 Non-pressure chronic ulcer of other part of left foot with unspecified severity: Secondary | ICD-10-CM | POA: Diagnosis present

## 2018-11-23 DIAGNOSIS — L03032 Cellulitis of left toe: Secondary | ICD-10-CM | POA: Diagnosis present

## 2018-11-23 DIAGNOSIS — I480 Paroxysmal atrial fibrillation: Secondary | ICD-10-CM | POA: Diagnosis present

## 2018-11-23 DIAGNOSIS — I700 Atherosclerosis of aorta: Secondary | ICD-10-CM | POA: Diagnosis present

## 2018-11-23 DIAGNOSIS — Z7401 Bed confinement status: Secondary | ICD-10-CM

## 2018-11-23 DIAGNOSIS — E861 Hypovolemia: Secondary | ICD-10-CM | POA: Diagnosis present

## 2018-11-23 DIAGNOSIS — R680 Hypothermia, not associated with low environmental temperature: Secondary | ICD-10-CM | POA: Diagnosis not present

## 2018-11-23 DIAGNOSIS — I959 Hypotension, unspecified: Secondary | ICD-10-CM | POA: Diagnosis present

## 2018-11-23 DIAGNOSIS — F419 Anxiety disorder, unspecified: Secondary | ICD-10-CM | POA: Diagnosis present

## 2018-11-23 DIAGNOSIS — Z820 Family history of epilepsy and other diseases of the nervous system: Secondary | ICD-10-CM

## 2018-11-23 DIAGNOSIS — I96 Gangrene, not elsewhere classified: Secondary | ICD-10-CM

## 2018-11-23 DIAGNOSIS — Z888 Allergy status to other drugs, medicaments and biological substances status: Secondary | ICD-10-CM

## 2018-11-23 LAB — CBC WITH DIFFERENTIAL/PLATELET
Abs Immature Granulocytes: 0.02 10*3/uL (ref 0.00–0.07)
Basophils Absolute: 0 10*3/uL (ref 0.0–0.1)
Basophils Relative: 1 %
Eosinophils Absolute: 0.3 10*3/uL (ref 0.0–0.5)
Eosinophils Relative: 4 %
HCT: 38.6 % — ABNORMAL LOW (ref 39.0–52.0)
Hemoglobin: 12.6 g/dL — ABNORMAL LOW (ref 13.0–17.0)
Immature Granulocytes: 0 %
Lymphocytes Relative: 11 %
Lymphs Abs: 0.9 10*3/uL (ref 0.7–4.0)
MCH: 26.3 pg (ref 26.0–34.0)
MCHC: 32.6 g/dL (ref 30.0–36.0)
MCV: 80.4 fL (ref 80.0–100.0)
Monocytes Absolute: 0.7 10*3/uL (ref 0.1–1.0)
Monocytes Relative: 8 %
Neutro Abs: 6.2 10*3/uL (ref 1.7–7.7)
Neutrophils Relative %: 76 %
Platelets: 255 10*3/uL (ref 150–400)
RBC: 4.8 MIL/uL (ref 4.22–5.81)
RDW: 18.3 % — ABNORMAL HIGH (ref 11.5–15.5)
WBC: 8.2 10*3/uL (ref 4.0–10.5)
nRBC: 0 % (ref 0.0–0.2)

## 2018-11-23 LAB — URINALYSIS, ROUTINE W REFLEX MICROSCOPIC
Bilirubin Urine: NEGATIVE
Glucose, UA: NEGATIVE mg/dL
Ketones, ur: NEGATIVE mg/dL
Nitrite: POSITIVE — AB
Protein, ur: NEGATIVE mg/dL
Specific Gravity, Urine: 1.008 (ref 1.005–1.030)
WBC, UA: >50 WBC/hpf — ABNORMAL HIGH (ref 0–5)
pH: 8 (ref 5.0–8.0)

## 2018-11-23 LAB — COMPREHENSIVE METABOLIC PANEL
ALT: 10 U/L (ref 0–44)
AST: 17 U/L (ref 15–41)
Albumin: 3.3 g/dL — ABNORMAL LOW (ref 3.5–5.0)
Alkaline Phosphatase: 80 U/L (ref 38–126)
Anion gap: 11 (ref 5–15)
BUN: 20 mg/dL (ref 8–23)
CO2: 24 mmol/L (ref 22–32)
Calcium: 8.6 mg/dL — ABNORMAL LOW (ref 8.9–10.3)
Chloride: 86 mmol/L — ABNORMAL LOW (ref 98–111)
Creatinine, Ser: 0.68 mg/dL (ref 0.61–1.24)
GFR calc Af Amer: >60 mL/min (ref >60)
GFR calc non Af Amer: >60 mL/min (ref >60)
Glucose, Bld: 104 mg/dL — ABNORMAL HIGH (ref 70–99)
Potassium: 4.7 mmol/L (ref 3.5–5.1)
Sodium: 121 mmol/L — ABNORMAL LOW (ref 135–145)
Total Bilirubin: 0.9 mg/dL (ref 0.3–1.2)
Total Protein: 6.9 g/dL (ref 6.5–8.1)

## 2018-11-23 LAB — LACTIC ACID, PLASMA
Lactic Acid, Venous: 1.2 mmol/L (ref 0.5–1.9)
Lactic Acid, Venous: 1.6 mmol/L (ref 0.5–1.9)

## 2018-11-23 LAB — SARS CORONAVIRUS 2 BY RT PCR (HOSPITAL ORDER, PERFORMED IN ~~LOC~~ HOSPITAL LAB): SARS Coronavirus 2: NEGATIVE

## 2018-11-23 MED ORDER — VANCOMYCIN HCL 10 G IV SOLR
1250.0000 mg | Freq: Once | INTRAVENOUS | Status: DC
  Filled 2018-11-23: qty 1250

## 2018-11-23 MED ORDER — ACETAMINOPHEN 325 MG PO TABS: 650.0000 mg | ORAL_TABLET | Freq: Four times a day (QID) | ORAL | Status: DC | PRN

## 2018-11-23 MED ORDER — CLINDAMYCIN PHOSPHATE 600 MG/50ML IV SOLN
600.0000 mg | Freq: Once | INTRAVENOUS | Status: AC
  Administered 2018-11-23: 600 mg via INTRAVENOUS
  Filled 2018-11-23: qty 50

## 2018-11-23 MED ORDER — METOPROLOL TARTRATE 25 MG PO TABS
12.5000 mg | ORAL_TABLET | Freq: Two times a day (BID) | ORAL | Status: DC
  Administered 2018-11-24 – 2018-11-27 (×3): 12.5 mg via ORAL
  Filled 2018-11-23 (×7): qty 1

## 2018-11-23 MED ORDER — VANCOMYCIN HCL 500 MG IV SOLR
500.0000 mg | Freq: Two times a day (BID) | INTRAVENOUS | Status: DC
  Filled 2018-11-23: qty 500

## 2018-11-23 MED ORDER — POLYETHYLENE GLYCOL 3350 17 G PO PACK: 17.0000 g | PACK | Freq: Every day | ORAL | Status: DC | PRN

## 2018-11-23 MED ORDER — HYDROCODONE-ACETAMINOPHEN 5-325 MG PO TABS
1.0000 | ORAL_TABLET | ORAL | Status: DC | PRN
  Administered 2018-11-24 – 2018-11-27 (×8): 1 via ORAL
  Filled 2018-11-23 (×8): qty 1

## 2018-11-23 MED ORDER — SODIUM CHLORIDE 0.9% FLUSH
3.0000 mL | Freq: Two times a day (BID) | INTRAVENOUS | Status: DC
  Administered 2018-11-24 – 2018-11-26 (×4): 3 mL via INTRAVENOUS

## 2018-11-23 MED ORDER — SODIUM CHLORIDE 0.9 % IV BOLUS
250.0000 mL | Freq: Once | INTRAVENOUS | Status: AC
  Administered 2018-11-23: 250 mL via INTRAVENOUS

## 2018-11-23 MED ORDER — ACETAMINOPHEN 650 MG RE SUPP: 650.0000 mg | Freq: Four times a day (QID) | RECTAL | Status: DC | PRN

## 2018-11-23 MED ORDER — SODIUM CHLORIDE 0.9 % IV SOLN
INTRAVENOUS | Status: AC
  Administered 2018-11-23: 20:00:00 via INTRAVENOUS

## 2018-11-23 MED ORDER — SODIUM CHLORIDE 0.9 % IV SOLN
250.0000 mL | INTRAVENOUS | Status: DC | PRN
  Administered 2018-11-25: 250 mL via INTRAVENOUS

## 2018-11-23 MED ORDER — VANCOMYCIN HCL IN DEXTROSE 750-5 MG/150ML-% IV SOLN: 750.0000 mg | Freq: Two times a day (BID) | INTRAVENOUS | Status: DC

## 2018-11-23 MED ORDER — VANCOMYCIN HCL 10 G IV SOLR
1250.0000 mg | Freq: Once | INTRAVENOUS | Status: AC
  Administered 2018-11-23: 1250 mg via INTRAVENOUS
  Filled 2018-11-23: qty 1250

## 2018-11-23 MED ORDER — SODIUM CHLORIDE 0.9 % IV SOLN
2.0000 g | Freq: Three times a day (TID) | INTRAVENOUS | Status: DC
  Administered 2018-11-23 – 2018-11-24 (×2): 2 g via INTRAVENOUS
  Filled 2018-11-23 (×2): qty 2

## 2018-11-23 MED ORDER — CEFAZOLIN SODIUM-DEXTROSE 1-4 GM/50ML-% IV SOLN
1.0000 g | Freq: Once | INTRAVENOUS | Status: AC
  Administered 2018-11-23: 1 g via INTRAVENOUS
  Filled 2018-11-23: qty 50

## 2018-11-23 MED ORDER — SODIUM CHLORIDE 0.9% FLUSH: 3.0000 mL | INTRAVENOUS | Status: DC | PRN

## 2018-11-23 MED ORDER — ALPRAZOLAM 0.5 MG PO TABS
0.5000 mg | ORAL_TABLET | Freq: Two times a day (BID) | ORAL | Status: DC | PRN
  Administered 2018-11-24: 0.5 mg via ORAL
  Filled 2018-11-23 (×2): qty 1

## 2018-11-23 MED ORDER — VANCOMYCIN HCL 500 MG IV SOLR
500.0000 mg | Freq: Two times a day (BID) | INTRAVENOUS | Status: DC
  Administered 2018-11-24 – 2018-11-26 (×5): 500 mg via INTRAVENOUS
  Filled 2018-11-23 (×7): qty 500

## 2018-11-23 NOTE — ED Triage Notes (Signed)
Pt brought in from home with left toe problem. Son states left second toe began bleeding this morning.

## 2018-11-23 NOTE — ED Notes (Signed)
Pt's son, Sherren Mocha, can be reached at [phone]

## 2018-11-23 NOTE — Progress Notes (Signed)
Pharmacy Antibiotic Note  Jon [name] is a 85 y.o. male admitted on 11/23/2018 with UTI and osteomyelitis.  Pharmacy has been consulted for Vancomycin and cefepime dosing.  Plan: Vancomycin 1250mg  IV loading dose, then 500mg  IV every 12 hours.  Goal trough 15-20 mcg/mL. Cefepime 2gm IV q8h F/U cxs and clinical progress Monitor V/S, labs and levels as indicated  Height: 5\' 11"  (180.3 cm) Weight: 135 lb (61.2 kg) IBW/kg (Calculated) : 75.3  Temp (24hrs), Avg:98 F (36.7 C), Min:97.5 F (36.4 C), Max:98.6 F (37 C)  Recent Labs  Lab 11/23/18 1650 11/23/18 1830  WBC 8.2  --   CREATININE 0.68  --   LATICACIDVEN 1.2 1.6    Estimated Creatinine Clearance: 59.5 mL/min (by C-G formula based on SCr of 0.68 mg/dL).    Allergies  Allergen Reactions  . Iohexol      Desc: PT STATES THROAT/FACE SWELLING W/ IVP DYE IN 1990. PT HAS NOT HAD ANY EXAMS DONE W/DYE SINCE AND HAS NEVER BEEN PRE MEDICATED. PER PT, Onset Date: [id number]   . Shellfish Allergy   . Dilantin [Phenytoin Sodium Extended] [name]  . Tegretol [Carbamazepine] [name]    Antimicrobials this admission: Vancomycin 6/8 >>  Cefepime 6/8  >>   Dose adjustments this admission: n/a  Microbiology results: 6/8 BCx: pending 6/8 UCx: pending  MRSA PCR:   Thank you for allowing pharmacy to be a part of this patient's care.  Jon [name], BS Pharm D, BCPS Clinical Pharmacist Pager [phone] 11/23/2018 8:30 PM

## 2018-11-23 NOTE — H&P (Signed)
History and Physical    Jon [name]:[id number] DOB: 1932-03-26 DOA: 11/23/2018  PCP: Gareth Morgan, MD   Patient coming from: Home   Chief Complaint: Toe discoloration and bleeding   HPI: Jon [name] is a 85 y.o. male with medical history significant for tachybradycardia syndrome with pacer, chronic diastolic CHF, paroxysmal atrial fibrillation not anticoagulated due to bleeding history, neurogenic bladder with Foley catheter, chronic hyponatremia, and bedbound with pressure ulcers, now presenting to the emergency department for evaluation of discoloration and bleeding from his left second toe.  Patient has had a ulcer involving the second toe on his left foot that had apparently been stable, but when family remove dressings today, they noted discoloration and bleeding.  Home health RN evaluated the foot and recommended transportation to the hospital.  Patient denies any subjective fevers or chills, denies cough or shortness of breath, and denies any chest pain.  He reports some pain from the bilateral feet, but states that it is tolerable.  ED Course: Upon arrival to the ED, patient is found to be afebrile, saturating well on room air, and with vitals otherwise stable.  Chemistry panel is notable for sodium of 121 and CBC features a slight normocytic anemia.  Lactic acid is reassuringly normal.  Urinalysis is concerning for possible infection.  Chest x-ray is negative for acute cardiopulmonary disease.  Radiographs of the left foot demonstrate severe demineralization of the distal phalanx of second toe, probably from osteomyelitis.  Blood cultures were collected, Foley catheter changed, urine culture ordered, and antibiotics initiated in the ED.  Surgery was consulted by the ED physician and hospitalists were asked to admit.  Review of Systems:  All other systems reviewed and apart from HPI, are negative.  Past Medical History:  Diagnosis Date  . Abnormality of gait   .  Arthritis   . B12 deficiency   . Bilateral hydronephrosis   . Bladder calculi   . Bleeding ulcer   . Dysrhythmia   . Essential hypertension   . Essential tremor   . Gastritis   . Macular degeneration of left eye   . Memory loss   . Multiple rib fractures   . Neuropathy   . OSA (obstructive sleep apnea)    Does not use CPAP  . PAF (paroxysmal atrial fibrillation) (HCC)   . Presence of permanent cardiac pacemaker   . Tachycardia-bradycardia syndrome (HCC)    [employer] PPM - Dr. Royann Shivers  . Varicose veins    Bilateral    Past Surgical History:  Procedure Laterality Date  . BRAIN SURGERY    . CATARACT EXTRACTION Bilateral 2013  . COLON SURGERY    . COLOSTOMY N/A 07/11/2014   Procedure: DIVERTING COLOSTOMY;  Surgeon: Dalia Heading, MD;  Location: [facility];  Service: General;  Laterality: N/A;  wound class: clean contaminated  . ESOPHAGOGASTRODUODENOSCOPY N/A 05/16/2015   Dr. Darrick Penna: few linear erosions/ulcerastions in distal esophagus, non-erosive gastritis, no duodenal abnormalities. Path with chronic gastritis, negative H.pylori.   . INCISION AND DRAINAGE OF WOUND N/A 07/11/2014   Procedure: DEBRIDEMENT OF SACRUM;  Surgeon: Dalia Heading, MD;  Location: [facility];  Service: General;  Laterality: N/A;  . INSERT / REPLACE / REMOVE PACEMAKER    . MENINGIOMA REMOVAL  1992   POST RT FRONTAL   . PACEMAKER INSERTION  2006  . PERMANENT PACEMAKER GENERATOR CHANGE N/A 08/13/2012   Procedure: PERMANENT PACEMAKER GENERATOR CHANGE;  Surgeon: Thurmon Fair, MD;  Location: [facility];  Service: Cardiovascular;  Laterality: N/A;     reports that he has quit smoking. His smoking use included cigarettes. He has a 18.75 pack-year smoking history. He has never used smokeless tobacco. He reports that he does not drink alcohol or use drugs.  Allergies  Allergen Reactions  . Iohexol      Desc: PT STATES THROAT/FACE SWELLING W/ IVP DYE IN 1990. PT HAS NOT HAD ANY EXAMS DONE W/DYE SINCE AND HAS NEVER  BEEN PRE MEDICATED. PER PT, Onset Date: [id number]   . Shellfish Allergy   . Dilantin [Phenytoin Sodium Extended] Rash  . Tegretol [Carbamazepine] Rash    Family History  Problem Relation Age of Onset  . Aortic aneurysm Mother   . Parkinsonism Mother   . Heart disease Father   . Esophageal cancer Sister   . Colon cancer Neg Hx      Prior to Admission medications   Medication Sig Start Date End Date Taking? Authorizing Provider  acetaminophen (TYLENOL) 500 MG tablet Take 500 mg by mouth every 6 (six) hours as needed for mild pain.    [provider]  ALPRAZolam Prudy Feeler(XANAX) 1 MG tablet Take 0.5 tablets (0.5 mg total) by mouth 2 (two) times daily as needed for anxiety. Patient taking differently: Take 0.5 mg by mouth 3 (three) times daily as needed for anxiety.  06/18/14   Hollice EspyKrishnan, Sendil K, MD  furosemide (LASIX) 40 MG tablet Take 1 tablet (40 mg total) by mouth daily. 08/02/18 08/02/19  Erick BlinksMemon, Jehanzeb, MD  metoprolol tartrate (LOPRESSOR) 25 MG tablet Take 0.5 tablets (12.5 mg total) by mouth 2 (two) times daily. 08/02/18   Erick BlinksMemon, Jehanzeb, MD  nystatin (MYCOSTATIN/NYSTOP) powder Apply topically 2 (two) times daily.    [provider]  potassium chloride 20 MEQ TBCR Take 40 mEq by mouth daily. 08/02/18   Erick BlinksMemon, Jehanzeb, MD  Tetrahydrozoline HCl (EYE DROPS OP) Apply 2 drops to eye daily as needed (irritation).    [provider]    Physical Exam: Vitals:   11/23/18 1800 11/23/18 1830 11/23/18 1853 11/23/18 1900  BP: 99/66 (!) 95/57 (!) 95/57 119/78  Pulse: 77 65 67 71  Resp:   15   Temp:   97.9 F (36.6 C)   TempSrc:   Oral   SpO2: 100% 90% 100% 100%  Weight:      Height:        Constitutional: NAD, calm, cachectic   Eyes: PERTLA, lids and conjunctivae normal ENMT: Mucous membranes are moist. Posterior pharynx clear of any exudate or lesions.   Neck: normal, supple, no masses, no thyromegaly Respiratory: no wheezing, no crackles. Normal respiratory  effort. No accessory muscle use.  Cardiovascular: S1 & S2 heard, regular rate and rhythm. No extremity edema.   Abdomen: No distension, no tenderness, soft. Bowel sounds normal.  Musculoskeletal: no clubbing / cyanosis. 2nd toe on left foot is atrophic with eschar and scant bloody drainage.    Skin: Left foot as above, otherwise xerosis, but warm and dry. Neurologic: No gross facial asymmetry. Gross hearing deficit. Sensation intact. Moving all extremities.  Psychiatric: Alert and oriented to person, place, and situation.  Very pleasant and cooperative.    Labs on Admission: I have personally reviewed following labs and imaging studies  CBC: Recent Labs  Lab 11/23/18 1650  WBC 8.2  NEUTROABS 6.2  HGB 12.6*  HCT 38.6*  MCV 80.4  PLT 255   Basic Metabolic Panel: Recent Labs  Lab 11/23/18 1650  NA 121*  K 4.7  CL  86*  CO2 24  GLUCOSE 104*  BUN 20  CREATININE 0.68  CALCIUM 8.6*   GFR: Estimated Creatinine Clearance: 59.5 mL/min (by C-G formula based on SCr of 0.68 mg/dL). Liver Function Tests: Recent Labs  Lab 11/23/18 1650  AST 17  ALT 10  ALKPHOS 80  BILITOT 0.9  PROT 6.9  ALBUMIN 3.3*   No results for input(s): LIPASE, AMYLASE in the last 168 hours. No results for input(s): AMMONIA in the last 168 hours. Coagulation Profile: No results for input(s): INR, PROTIME in the last 168 hours. Cardiac Enzymes: No results for input(s): CKTOTAL, CKMB, CKMBINDEX, TROPONINI in the last 168 hours. BNP (last 3 results) No results for input(s): PROBNP in the last 8760 hours. HbA1C: No results for input(s): HGBA1C in the last 72 hours. CBG: No results for input(s): GLUCAP in the last 168 hours. Lipid Profile: No results for input(s): CHOL, HDL, LDLCALC, TRIG, CHOLHDL, LDLDIRECT in the last 72 hours. Thyroid Function Tests: No results for input(s): TSH, T4TOTAL, FREET4, T3FREE, THYROIDAB in the last 72 hours. Anemia Panel: No results for input(s): VITAMINB12, FOLATE,  FERRITIN, TIBC, IRON, RETICCTPCT in the last 72 hours. Urine analysis:    Component Value Date/Time   COLORURINE YELLOW 11/23/2018 1732   APPEARANCEUR CLOUDY (A) 11/23/2018 1732   LABSPEC 1.008 11/23/2018 1732   PHURINE 8.0 11/23/2018 1732   GLUCOSEU NEGATIVE 11/23/2018 1732   HGBUR SMALL (A) 11/23/2018 1732   BILIRUBINUR NEGATIVE 11/23/2018 1732   KETONESUR NEGATIVE 11/23/2018 1732   PROTEINUR NEGATIVE 11/23/2018 1732   UROBILINOGEN 0.2 03/14/2015 1515   NITRITE POSITIVE (A) 11/23/2018 1732   LEUKOCYTESUR LARGE (A) 11/23/2018 1732   Sepsis Labs: @LABRCNTIP (procalcitonin:4,lacticidven:4) ) Recent Results (from the past 240 hour(s))  Culture, blood (routine x 2)     Status: None (Preliminary result)   Collection Time: 11/23/18  4:50 PM  Result Value Ref Range Status   Specimen Description BLOOD RIGHT ARM  Final   Special Requests   Final    BOTTLES DRAWN AEROBIC AND ANAEROBIC Blood Culture adequate volume Performed at [name], [address] [place], [place] [address]    Culture PENDING  Incomplete   Report Status PENDING  Incomplete  Culture, blood (routine x 2)     Status: None (Preliminary result)   Collection Time: 11/23/18  4:59 PM  Result Value Ref Range Status   Specimen Description BLOOD LEFT WRIST  Final   Special Requests   Final    BOTTLES DRAWN AEROBIC AND ANAEROBIC Blood Culture adequate volume Performed at [name], [address] [place], [place] [address]    Culture PENDING  Incomplete   Report Status PENDING  Incomplete  SARS Coronavirus 2 (CEPHEID - Performed [facility]), Hosp Order     Status: None   Collection Time: 11/23/18  5:27 PM  Result Value Ref Range Status   SARS Coronavirus 2 NEGATIVE NEGATIVE Final    Comment: (NOTE) If result is NEGATIVE SARS-CoV-2 target nucleic acids are NOT DETECTED. The SARS-CoV-2 RNA is generally detectable in upper and lower  respiratory specimens during the acute phase of infection. The  lowest  concentration of SARS-CoV-2 viral copies this assay can detect is 250  copies / mL. A negative result does not preclude SARS-CoV-2 infection  and should not be used as the sole basis for treatment or other  patient management decisions.  A negative result may occur with  improper specimen collection / handling, submission of specimen other  than nasopharyngeal  swab, presence of viral mutation(s) within the  areas targeted by this assay, and inadequate number of viral copies  (<250 copies / mL). A negative result must be combined with clinical  observations, patient history, and epidemiological information. If result is POSITIVE SARS-CoV-2 target nucleic acids are DETECTED. The SARS-CoV-2 RNA is generally detectable in upper and lower  respiratory specimens dur ing the acute phase of infection.  Positive  results are indicative of active infection with SARS-CoV-2.  Clinical  correlation with patient history and other diagnostic information is  necessary to determine patient infection status.  Positive results do  not rule out bacterial infection or co-infection with other viruses. If result is PRESUMPTIVE POSTIVE SARS-CoV-2 nucleic acids MAY BE PRESENT.   A presumptive positive result was obtained on the submitted specimen  and confirmed on repeat testing.  While 2019 novel coronavirus  (SARS-CoV-2) nucleic acids may be present in the submitted sample  additional confirmatory testing may be necessary for epidemiological  and / or clinical management purposes  to differentiate between  SARS-CoV-2 and other Sarbecovirus currently known to infect humans.  If clinically indicated additional testing with an alternate test  methodology [id number]) is advised. The SARS-CoV-2 RNA is generally  detectable in upper and lower respiratory sp ecimens during the acute  phase of infection. The expected result is Negative. Fact Sheet for Patients:  StrictlyIdeas.no  Fact Sheet for Healthcare Providers: BankingDealers.co.za This test is not yet approved or cleared by the [place] FDA and has been authorized for detection and/or diagnosis of SARS-CoV-2 by FDA under an Emergency Use Authorization (EUA).  This EUA will remain in effect (meaning this test can be used) for the duration of the COVID-19 declaration under Section 564(b)(1) of the Act, 21 U.S.C. section 360bbb-3(b)(1), unless the authorization is terminated or revoked sooner. Performed at [facility], [address], [place], [place] [address]      Radiological Exams on Admission: Dg Chest Portable 1 View  Result Date: 11/23/2018 CLINICAL DATA:  85 year old male with history of cellulitis of the left second toes. EXAM: PORTABLE CHEST 1 VIEW COMPARISON:  Chest x-ray 06/03/2017. FINDINGS: Lung volumes are normal. No consolidative airspace disease. No pleural effusions. No pneumothorax. No pulmonary nodule or mass noted. Pulmonary vasculature and the cardiomediastinal silhouette are within normal limits. Atherosclerosis in the thoracic aorta. Left-sided pacemaker device in place with lead tips projecting over the expected location of the right atrium and right ventricle. IMPRESSION: 1.  No radiographic evidence of acute cardiopulmonary disease. 2. Aortic atherosclerosis. Electronically Signed   By: Vinnie Langton M.D.   On: 11/23/2018 18:01   Dg Foot Complete Left  Result Date: 11/23/2018 CLINICAL DATA:  Cellulitis of the second toe EXAM: LEFT FOOT - COMPLETE 3+ VIEW COMPARISON:  None. FINDINGS: Markedly severe bony demineralization, with hyperflexion of the MTP joints of the second through fifth digits leading to bony overlap and very poor definition of the phalanges. Difficult standard positioning of the foot. No obvious dislocation at the Lisfranc joint. We were not able to obtain a true lateral projection. Degenerative midfoot findings. In the second toe there is severe  distal phalangeal bony demineralization probably from osteomyelitis. Poor definition of the middle phalanx. Suspected fracture of the distal metaphysis and head of the proximal phalanx. Other phalanges including the fourth and fifth toe distal phalanges are highly indistinct. IMPRESSION: 1. Severe demineralization of the distal phalanx second toe probably from osteomyelitis. 2. Probable fracture of the distal metaphysis and head of the proximal  phalanx second toe. Poor visualization of the distal phalanges of the fourth and fifth toes, significance uncertain. 3. Severe osteoporosis/bony demineralization. 4. Foot deformities make standard projections problematic. We were unable to obtain standard views. Electronically Signed   By: Gaylyn RongWalter  Liebkemann M.D.   On: 11/23/2018 18:04    EKG: Not performed.   Assessment/Plan   1. Osteomyelitis   - Presents with worsening in chronic left 2nd toe ulcer, now with eschar and bleeding   - Patient is not septic  - Radiographs concerning for osteomyelitis  - Blood cultures collected in ED and antibiotic started  - Surgery consulted by ED physician, will keep NPO after midnight pending their eval  - Feet are warm and pink but atrophic and there is concern for PAD contributing to this; check ABI's  - Given history of pseudomonas infection and MRSA carriage, antibiotics broadened to vancomycin and cefepime for now, follow cultures, trend inflammatory markers   2. UTI  - Foley was replaced in ED, UA concerning for infection, pt has hx of MDR organisms  - Blood and urine cultures collected in ED  - He is on broad-spectrum antibiotics for now, follow cultures and clinical course   3. Hyponatremia  - Appears to be chronic, worse today  - No neurologic symptoms   - Appears hypovolemic  - Check urine sodium and osm, hold Lasix, continue gentle IVF hydration, and follow serial chem panels to avoid rapid correction    4. Chronic diastolic CHF  - Appears  compensated, likely hypovolemic  - Hold Lasix while gently hydrating, continue beta-blocker as tolerated, follow daily wt and I/O's   5. Paroxysmal atrial fibrillation  - In a regular rhythm on admission  - CHADS-VASc at least 4 (age x2, CHF, HTN) - Not anticoagulated d/t bleeding hx  - Hold ASA pending surgeon's evaluation, hold Lasix in light of hyponatremia and suspected hypovolemia, continue metoprolol as tolerated     PPE: Mask, face shield. Patient wearing mask.  DVT prophylaxis: SCD's   Code Status: DNR  Family Communication: Discussed with patient  Consults called: Surgery consulted by ED physician  Admission status: Observation     Briscoe Deutscherimothy S Zelpha Messing, MD [facility] Pager [phone]  If 7PM-7AM, please contact night-coverage [email or web address] Password [email or web address]  11/23/2018, 7:33 PM

## 2018-11-23 NOTE — ED Provider Notes (Signed)
[facility] EMERGENCY DEPARTMENT Provider Note   CSN: [id number] Arrival date & time: 11/23/18  1621    History   Chief Complaint No chief complaint on file.   HPI Jon [name] is a 85 y.o. male.     Patient is an 85 year old male who presents to the emergency department with a complaint of bleeding from the left toe.  The patient was brought to the emergency department by EMS.  The history is obtained from the patient, from EMS, and from the patient's son, Sherren Mocha.  The patient is bed ridden.  He has not been ambulatory in approximately 4 years.  He has been treated in the past for abscess of the scrotal area as well as pressure wounds of the buttocks area.  He has been having infected areas of the right foot.  He had recently had improvement in the infection of the right second toe.  He later developed infection of the left second toe.  The family took a dressing off of the foot earlier today and noted bleeding from the toe.  The patient was evaluated by the home health nurse and was advised to come to the emergency department.  The family nor the patient report any high fever.  There is been no nausea or vomiting reported.  No significant changes in the usual intake.  Patient has a pacemaker.  He has a history of atrial fibrillation.  He also has a colostomy.  The patient has not taken any medication concerning his toe.  The history is provided by the EMS personnel, the patient and a relative.    Past Medical History:  Diagnosis Date  . Abnormality of gait   . Arthritis   . B12 deficiency   . Bilateral hydronephrosis   . Bladder calculi   . Bleeding ulcer   . Dysrhythmia   . Essential hypertension   . Essential tremor   . Gastritis   . Macular degeneration of left eye   . Memory loss   . Multiple rib fractures   . Neuropathy   . OSA (obstructive sleep apnea)    Does not use CPAP  . PAF (paroxysmal atrial fibrillation) ([place])   . Presence of permanent cardiac  pacemaker   . Tachycardia-bradycardia syndrome (HCC)    [employer] PPM - Dr. Sallyanne Kuster  . Varicose veins    Bilateral    Patient Active Problem List   Diagnosis Date Noted  . Scrotal abscess 07/28/2018  . Dyspnea   . Decubitus ulcer of sacral region, stage 4 ([place]) 06/04/2017  . Severe protein-calorie malnutrition ([place]) 06/03/2017  . Acute metabolic encephalopathy 31/54/0086  . Hypothermia 05/31/2017  . Encounter for hospice care discussion   . Weak 05/27/2017  . Lethargy 05/27/2017  . Leukocytosis   . N&V (nausea and vomiting)   . Goals of care, counseling/discussion   . DNR (do not resuscitate) discussion   . Palliative care encounter   . Pressure injury of skin 08/19/2016  . Sepsis  08/19/2016  . Gastric distention   . Heme positive stool   . UGI bleed 08/18/2016  . Sacral decubitus ulcer, stage IV ([place]) 05-14-2016  . Epistaxis 05/10/2016  . Diarrhea 05/02/2016  . Prolonged QT interval   . PAF (paroxysmal atrial fibrillation) ([place])   . Tachycardia-bradycardia syndrome ([place])   . Cardiac pacemaker in situ   . Hematemesis 05/15/2015  . Gastrointestinal hemorrhage 05/15/2015  . Malnutrition of moderate degree ([place]) 07/11/2014  . Perianal infection 07/06/2014  . Decubitus ulcer  07/06/2014  . Sacral ulcer (HCC) 07/06/2014  . Anemia, chronic disease 06/16/2014  . Sepsis (HCC) 06/15/2014  . Lower urinary tract infectious disease 06/15/2014  . Acute kidney injury (HCC) 06/15/2014  . Bilateral hydronephrosis 06/15/2014  . Bladder calculi 06/15/2014  . Rhabdomyolysis 06/15/2014  . Hyponatremia 06/15/2014  . Multiple rib fractures 06/15/2014  . Hypotension 06/15/2014  . S/P placement of cardiac pacemaker 06/15/2014  . Dementia (HCC) 06/15/2014  . AKI (acute kidney injury) (HCC) 06/15/2014  . Knee pain   . Severe dehydration 06/09/2014  . Hypertension 06/09/2014  . Abnormality of gait 05/03/2013  . Essential and other specified forms of tremor 05/03/2013  .  Polyneuropathy in other diseases classified elsewhere (HCC) 05/03/2013  . Atrial fibrillation (HCC) 09/07/2012  . Long term current use of anticoagulant therapy 09/07/2012    Past Surgical History:  Procedure Laterality Date  . BRAIN SURGERY    . CATARACT EXTRACTION Bilateral 2013  . COLON SURGERY    . COLOSTOMY N/A 07/11/2014   Procedure: DIVERTING COLOSTOMY;  Surgeon: Dalia HeadingMark A Jenkins, MD;  Location: [facility] ORS;  Service: General;  Laterality: N/A;  wound class: clean contaminated  . ESOPHAGOGASTRODUODENOSCOPY N/A 05/16/2015   Dr. Darrick PennaFields: few linear erosions/ulcerastions in distal esophagus, non-erosive gastritis, no duodenal abnormalities. Path with chronic gastritis, negative H.pylori.   . INCISION AND DRAINAGE OF WOUND N/A 07/11/2014   Procedure: DEBRIDEMENT OF SACRUM;  Surgeon: Dalia HeadingMark A Jenkins, MD;  Location: [facility] ORS;  Service: General;  Laterality: N/A;  . INSERT / REPLACE / REMOVE PACEMAKER    . MENINGIOMA REMOVAL  1992   POST RT FRONTAL   . PACEMAKER INSERTION  2006  . PERMANENT PACEMAKER GENERATOR CHANGE N/A 08/13/2012   Procedure: PERMANENT PACEMAKER GENERATOR CHANGE;  Surgeon: Thurmon FairMihai Croitoru, MD;  Location: [facility] CATH LAB;  Service: Cardiovascular;  Laterality: N/A;        Home Medications    Prior to Admission medications   Medication Sig Start Date End Date Taking? Authorizing Provider  acetaminophen (TYLENOL) 500 MG tablet Take 500 mg by mouth every 6 (six) hours as needed for mild pain.    [provider]  ALPRAZolam Prudy Feeler(XANAX) 1 MG tablet Take 0.5 tablets (0.5 mg total) by mouth 2 (two) times daily as needed for anxiety. Patient taking differently: Take 0.5 mg by mouth 3 (three) times daily as needed for anxiety.  06/18/14   Hollice EspyKrishnan, Sendil K, MD  furosemide (LASIX) 40 MG tablet Take 1 tablet (40 mg total) by mouth daily. 08/02/18 08/02/19  Erick BlinksMemon, Jehanzeb, MD  metoprolol tartrate (LOPRESSOR) 25 MG tablet Take 0.5 tablets (12.5 mg total) by mouth 2 (two) times daily. 08/02/18    Erick BlinksMemon, Jehanzeb, MD  nystatin (MYCOSTATIN/NYSTOP) powder Apply topically 2 (two) times daily.    [provider]  potassium chloride 20 MEQ TBCR Take 40 mEq by mouth daily. 08/02/18   Erick BlinksMemon, Jehanzeb, MD  Tetrahydrozoline HCl (EYE DROPS OP) Apply 2 drops to eye daily as needed (irritation).    [provider]    Family History Family History  Problem Relation Age of Onset  . Aortic aneurysm Mother   . Parkinsonism Mother   . Heart disease Father   . Esophageal cancer Sister   . Colon cancer Neg Hx     Social History Social History   Tobacco Use  . Smoking status: Former Smoker    Packs/day: 0.75    Years: 25.00    Pack years: 18.75    Types: Cigarettes  . Smokeless  tobacco: Never Used  . Tobacco comment: QUIT 35 YRS AGO  Substance Use Topics  . Alcohol use: No  . Drug use: No     Allergies   Iohexol; Shellfish allergy; Dilantin [phenytoin sodium extended]; and Tegretol [carbamazepine]   Review of Systems Review of Systems  Constitutional: Negative for activity change.       All ROS Neg except as noted in HPI  HENT: Negative.   Eyes: Negative for photophobia and discharge.  Respiratory: Negative for cough, shortness of breath and wheezing.   Cardiovascular: Negative for chest pain and palpitations.  Gastrointestinal: Negative for abdominal pain and blood in stool.  Genitourinary: Negative for dysuria, frequency and hematuria.  Musculoskeletal: Positive for arthralgias and back pain. Negative for neck pain.  Skin: Positive for wound.       Left second toe infection.  Neurological: Negative for dizziness, seizures and speech difficulty.  Psychiatric/Behavioral: Negative for confusion and hallucinations.     Physical Exam Updated Vital Signs There were no vitals taken for this visit.  Physical Exam Vitals signs and nursing note reviewed.  Constitutional:      Appearance: He is well-developed. He is not toxic-appearing.  HENT:     Head:  Normocephalic.     Right Ear: Tympanic membrane and external ear normal.     Left Ear: Tympanic membrane and external ear normal.  Eyes:     General: Lids are normal.     Pupils: Pupils are equal, round, and reactive to light.  Neck:     Musculoskeletal: Normal range of motion and neck supple.     Vascular: No carotid bruit.  Cardiovascular:     Rate and Rhythm: Normal rate. Rhythm irregularly irregular.     Pulses: Decreased pulses.          Radial pulses are 2+ on the right side and 2+ on the left side.       Dorsalis pedis pulses are 1+ on the right side and 1+ on the left side.       Posterior tibial pulses are detected w/ Doppler on the right side and detected w/ Doppler on the left side.     Comments: There is a black dry hard eschar of multiple areas of the left second toe.  There is no active bleeding from the area at this point.  There is some deformity present.  There is also noted a dark area at the distal tip of the first left toe.  There is hyperflexion deformity of the remaining toes of the left lower extremity.  There is increased redness of the dorsal and plantar area behind the second left toe.  There is scaling of skin on the left foot. Pulmonary:     Effort: No respiratory distress.     Breath sounds: Normal breath sounds.  Abdominal:     General: Bowel sounds are normal.     Palpations: Abdomen is soft.     Tenderness: There is no abdominal tenderness. There is no guarding.     Comments: Colostomy present and functioning well.  Musculoskeletal: Normal range of motion.  Lymphadenopathy:     Head:     Right side of head: No submandibular adenopathy.     Left side of head: No submandibular adenopathy.     Cervical: No cervical adenopathy.  Skin:    General: Skin is warm and dry.     Comments: There is dry scaling skin of the back.  There is a stage II-III decubitus  ulcer of the sacral area.  There are no red streaks appreciated.  There is minimal drainage from the  site at this time.  The area is warm, but not hot.  Neurological:     Mental Status: He is alert and oriented to person, place, and time.     Cranial Nerves: No cranial nerve deficit.     Sensory: No sensory deficit.  Psychiatric:        Speech: Speech normal.          ED Treatments / Results  Labs (all labs ordered are listed, but only abnormal results are displayed) Labs Reviewed - No data to display  EKG None  Radiology No results found.  Procedures Procedures (including critical care time)  Medications Ordered in ED Medications - No data to display   Initial Impression / Assessment and Plan / ED Course  I have reviewed the triage vital signs and the nursing notes.  Pertinent labs & imaging results that were available during my care of the patient were reviewed by me and considered in my medical decision making (see chart for details).          Final Clinical Impressions(s) / ED Diagnoses MDM Pt seen with me by Dr Particia NearingHaviland.  Vital signs reviewed.  Patient is bed ridden.  He has not walked in nearly 4 years.  He has had issues with his feet in the past.  Today when the family took the dressing off of his left foot they noted bleeding from the left second toe.  The home health nurse examined the patient and suggested he be brought to the emergency department.  The comprehensive metabolic panel shows the sodium to be low at 121, the chloride is low at 86, the albumin is low at 3.3, otherwise the comprehensive metabolic panel is within normal limits.  The complete blood count is nonacute.  The lactic acid is normal at 1.2.  X-ray of the foot (left) shows severe demineralization of the distal phalanx of the second toe, probably related to osteomyelitis.  There is a probable fracture of the distal phalanx and head of the proximal phalanx of the second toe.  There is severe osteoporosis appreciated.  The chest x-ray shows no radiographic evidence of acute  cardiopulmonary disease.  There is noted aortic atherosclerosis.  Patient has an indwelling Foley catheter.  A new Foley was inserted, and a urine analysis was obtained.  The patient has a cloudy yellow specimen with a specific gravity of 1.008.  There is a small amount of hemoglobin on the dipstick.  There is positive nitrates and large leukocyte esterase present with 11-20 red blood cells and greater than 50 white blood cells present a culture has been sent to the lab.  Case discussed with Dr. Anola GurneyBridges-general surgery.  She will see the patient on consultation.  Call placed to the [facility] team for admission. Discussed case with Pt. He is in agreement with possible admission. B/p 119 systolic. Pulse ox 100% on room air. Dr Antionette Charpyd will see pt for admission. Permission obtained from pt to discuss case with Tawanna Coolerodd - son. Questions answered. Pt's son updated on findings and plan for admission.   Final diagnoses:  Osteomyelitis of second toe of left foot (HCC)  Urinary tract infection without hematuria, site unspecified    ED Discharge Orders    None       Ivery QualeBryant, Vonna Brabson, PA-C 11/23/18 2222    Jacalyn LefevreHaviland, Julie, MD 11/24/18 1406

## 2018-11-24 ENCOUNTER — Observation Stay (HOSPITAL_COMMUNITY): Payer: Medicare Other

## 2018-11-24 DIAGNOSIS — G4733 Obstructive sleep apnea (adult) (pediatric): Secondary | ICD-10-CM | POA: Diagnosis present

## 2018-11-24 DIAGNOSIS — I959 Hypotension, unspecified: Secondary | ICD-10-CM | POA: Diagnosis present

## 2018-11-24 DIAGNOSIS — I96 Gangrene, not elsewhere classified: Secondary | ICD-10-CM

## 2018-11-24 DIAGNOSIS — Z681 Body mass index (BMI) 19 or less, adult: Secondary | ICD-10-CM | POA: Diagnosis not present

## 2018-11-24 DIAGNOSIS — F419 Anxiety disorder, unspecified: Secondary | ICD-10-CM | POA: Diagnosis present

## 2018-11-24 DIAGNOSIS — N319 Neuromuscular dysfunction of bladder, unspecified: Secondary | ICD-10-CM | POA: Diagnosis present

## 2018-11-24 DIAGNOSIS — I495 Sick sinus syndrome: Secondary | ICD-10-CM | POA: Diagnosis present

## 2018-11-24 DIAGNOSIS — Z20828 Contact with and (suspected) exposure to other viral communicable diseases: Secondary | ICD-10-CM | POA: Diagnosis present

## 2018-11-24 DIAGNOSIS — Y846 Urinary catheterization as the cause of abnormal reaction of the patient, or of later complication, without mention of misadventure at the time of the procedure: Secondary | ICD-10-CM | POA: Diagnosis present

## 2018-11-24 DIAGNOSIS — L03032 Cellulitis of left toe: Secondary | ICD-10-CM | POA: Diagnosis present

## 2018-11-24 DIAGNOSIS — I110 Hypertensive heart disease with heart failure: Secondary | ICD-10-CM | POA: Diagnosis present

## 2018-11-24 DIAGNOSIS — R7881 Bacteremia: Secondary | ICD-10-CM | POA: Diagnosis present

## 2018-11-24 DIAGNOSIS — M869 Osteomyelitis, unspecified: Secondary | ICD-10-CM | POA: Diagnosis present

## 2018-11-24 DIAGNOSIS — L97529 Non-pressure chronic ulcer of other part of left foot with unspecified severity: Secondary | ICD-10-CM | POA: Diagnosis present

## 2018-11-24 DIAGNOSIS — I5032 Chronic diastolic (congestive) heart failure: Secondary | ICD-10-CM | POA: Diagnosis present

## 2018-11-24 DIAGNOSIS — F0390 Unspecified dementia without behavioral disturbance: Secondary | ICD-10-CM | POA: Diagnosis present

## 2018-11-24 DIAGNOSIS — I480 Paroxysmal atrial fibrillation: Secondary | ICD-10-CM | POA: Diagnosis present

## 2018-11-24 DIAGNOSIS — N390 Urinary tract infection, site not specified: Secondary | ICD-10-CM | POA: Diagnosis present

## 2018-11-24 DIAGNOSIS — S91105A Unspecified open wound of left lesser toe(s) without damage to nail, initial encounter: Secondary | ICD-10-CM | POA: Diagnosis present

## 2018-11-24 DIAGNOSIS — E861 Hypovolemia: Secondary | ICD-10-CM | POA: Diagnosis present

## 2018-11-24 DIAGNOSIS — G8929 Other chronic pain: Secondary | ICD-10-CM | POA: Diagnosis present

## 2018-11-24 DIAGNOSIS — E871 Hypo-osmolality and hyponatremia: Secondary | ICD-10-CM | POA: Diagnosis present

## 2018-11-24 DIAGNOSIS — R680 Hypothermia, not associated with low environmental temperature: Secondary | ICD-10-CM | POA: Diagnosis not present

## 2018-11-24 DIAGNOSIS — I700 Atherosclerosis of aorta: Secondary | ICD-10-CM | POA: Diagnosis present

## 2018-11-24 DIAGNOSIS — T83518A Infection and inflammatory reaction due to other urinary catheter, initial encounter: Secondary | ICD-10-CM | POA: Diagnosis present

## 2018-11-24 DIAGNOSIS — E43 Unspecified severe protein-calorie malnutrition: Secondary | ICD-10-CM | POA: Diagnosis present

## 2018-11-24 LAB — BLOOD CULTURE ID PANEL (REFLEXED)

## 2018-11-24 LAB — SEDIMENTATION RATE: Sed Rate: 20 mm/hr — ABNORMAL HIGH (ref 0–16)

## 2018-11-24 LAB — CBC WITH DIFFERENTIAL/PLATELET
Abs Immature Granulocytes: 0.05 10*3/uL (ref 0.00–0.07)
Basophils Absolute: 0 10*3/uL (ref 0.0–0.1)
Basophils Relative: 1 %
Eosinophils Absolute: 0.2 10*3/uL (ref 0.0–0.5)
Eosinophils Relative: 2 %
HCT: 40.3 % (ref 39.0–52.0)
Hemoglobin: 13 g/dL (ref 13.0–17.0)
Immature Granulocytes: 1 %
Lymphocytes Relative: 7 %
Lymphs Abs: 0.5 10*3/uL — ABNORMAL LOW (ref 0.7–4.0)
MCH: 26.6 pg (ref 26.0–34.0)
MCHC: 32.3 g/dL (ref 30.0–36.0)
MCV: 82.4 fL (ref 80.0–100.0)
Monocytes Absolute: 0.3 10*3/uL (ref 0.1–1.0)
Monocytes Relative: 4 %
Neutro Abs: 6.9 10*3/uL (ref 1.7–7.7)
Neutrophils Relative %: 85 %
Platelets: 241 10*3/uL (ref 150–400)
RBC: 4.89 MIL/uL (ref 4.22–5.81)
RDW: 18.2 % — ABNORMAL HIGH (ref 11.5–15.5)
WBC: 8 10*3/uL (ref 4.0–10.5)
nRBC: 0 % (ref 0.0–0.2)

## 2018-11-24 LAB — BASIC METABOLIC PANEL
Anion gap: 10 (ref 5–15)
Anion gap: 11 (ref 5–15)
Anion gap: 13 (ref 5–15)
BUN: 16 mg/dL (ref 8–23)
BUN: 17 mg/dL (ref 8–23)
BUN: 18 mg/dL (ref 8–23)
CO2: 19 mmol/L — ABNORMAL LOW (ref 22–32)
CO2: 20 mmol/L — ABNORMAL LOW (ref 22–32)
CO2: 22 mmol/L (ref 22–32)
Calcium: 7.9 mg/dL — ABNORMAL LOW (ref 8.9–10.3)
Calcium: 8.2 mg/dL — ABNORMAL LOW (ref 8.9–10.3)
Calcium: 8.5 mg/dL — ABNORMAL LOW (ref 8.9–10.3)
Chloride: 91 mmol/L — ABNORMAL LOW (ref 98–111)
Chloride: 94 mmol/L — ABNORMAL LOW (ref 98–111)
Chloride: 96 mmol/L — ABNORMAL LOW (ref 98–111)
Creatinine, Ser: 0.63 mg/dL (ref 0.61–1.24)
Creatinine, Ser: 0.63 mg/dL (ref 0.61–1.24)
Creatinine, Ser: 0.66 mg/dL (ref 0.61–1.24)
GFR calc Af Amer: >60 mL/min (ref >60)
GFR calc Af Amer: >60 mL/min (ref >60)
GFR calc Af Amer: >60 mL/min (ref >60)
GFR calc non Af Amer: >60 mL/min (ref >60)
GFR calc non Af Amer: >60 mL/min (ref >60)
GFR calc non Af Amer: >60 mL/min (ref >60)
Glucose, Bld: 103 mg/dL — ABNORMAL HIGH (ref 70–99)
Glucose, Bld: 90 mg/dL (ref 70–99)
Glucose, Bld: 93 mg/dL (ref 70–99)
Potassium: 4.3 mmol/L (ref 3.5–5.1)
Potassium: 4.4 mmol/L (ref 3.5–5.1)
Potassium: 4.5 mmol/L (ref 3.5–5.1)
Sodium: 123 mmol/L — ABNORMAL LOW (ref 135–145)
Sodium: 126 mmol/L — ABNORMAL LOW (ref 135–145)
Sodium: 127 mmol/L — ABNORMAL LOW (ref 135–145)

## 2018-11-24 LAB — C-REACTIVE PROTEIN: CRP: 1.9 mg/dL — ABNORMAL HIGH (ref <1.0)

## 2018-11-24 MED ORDER — ENSURE ENLIVE PO LIQD
237.0000 mL | Freq: Two times a day (BID) | ORAL | Status: DC
  Administered 2018-11-24 – 2018-11-27 (×7): 237 mL via ORAL

## 2018-11-24 MED ORDER — CAMPHOR-MENTHOL 0.5-0.5 % EX LOTN
TOPICAL_LOTION | CUTANEOUS | Status: DC | PRN
  Administered 2018-11-24: 18:00:00 via TOPICAL
  Administered 2018-11-25: 1 via TOPICAL
  Filled 2018-11-24: qty 222

## 2018-11-24 MED ORDER — SODIUM CHLORIDE 0.9 % IV SOLN
INTRAVENOUS | Status: AC
  Administered 2018-11-24 (×2): via INTRAVENOUS

## 2018-11-24 MED ORDER — SODIUM CHLORIDE 0.9 % IV SOLN
2.0000 g | INTRAVENOUS | Status: DC
  Administered 2018-11-24 – 2018-11-25 (×2): 2 g via INTRAVENOUS
  Filled 2018-11-24 (×2): qty 20

## 2018-11-24 MED ORDER — POVIDONE-IODINE 10 % EX SOLN
CUTANEOUS | Status: DC | PRN
  Filled 2018-11-24: qty 118

## 2018-11-24 MED ORDER — SODIUM CHLORIDE 0.9 % IV BOLUS
500.0000 mL | Freq: Once | INTRAVENOUS | Status: AC
  Administered 2018-11-24: 500 mL via INTRAVENOUS

## 2018-11-24 MED ORDER — SODIUM CHLORIDE 0.9 % IV BOLUS
250.0000 mL | Freq: Once | INTRAVENOUS | Status: AC
  Administered 2018-11-24: 250 mL via INTRAVENOUS

## 2018-11-24 MED ORDER — PRO-STAT SUGAR FREE PO LIQD
30.0000 mL | Freq: Two times a day (BID) | ORAL | Status: DC
  Administered 2018-11-24 – 2018-11-27 (×6): 30 mL via ORAL
  Filled 2018-11-24 (×7): qty 30

## 2018-11-24 MED ORDER — JUVEN PO PACK
1.0000 | PACK | Freq: Two times a day (BID) | ORAL | Status: DC
  Administered 2018-11-24 – 2018-11-27 (×7): 1 via ORAL
  Filled 2018-11-24 (×7): qty 1

## 2018-11-24 NOTE — Progress Notes (Signed)
Responded to nursing call:   hypothermia And positive blood cultures   Vitals:   11/24/18 0700 11/24/18 0909 11/24/18 1440 11/24/18 1500  BP: (!) 114/50 105/66 100/70   Pulse: 63 99 (!) 59   Resp: 16 16 16    Temp: 97.8 F (36.6 C) (!) 97.5 F (36.4 C) (!) 94.3 F (34.6 C) (!) 96.3 F (35.7 C)  TempSrc: Oral Oral  Rectal  SpO2: 99% 100%    Weight:  59 kg    Height:  5\' 11"  (1.803 m)        Assessment/Plan: Hypothermia -?due to infection -warming blanket -TSH, free T4 -am cortisol  Bacteremia -GPC in one of 2 sets -pt already on vanco--continue pending final culture data     Orson Eva, DO [facility]

## 2018-11-24 NOTE — Progress Notes (Addendum)
Initial Nutrition Assessment  RD working remotely.    DOCUMENTATION CODES:   Underweight   INTERVENTION:  RD will continue to follow and add supplements as indicated once diet is advanced and po adequacy assessed  NUTRITION DIAGNOSIS:   Increased nutrient needs related to acute illness, wound healing(osteomyelitits left second toe and stage 2 PI to coccyx) as evidenced by estimated needs.  GOAL:   Patient will meet greater than or equal to 90% of their needs MONITOR:   Diet advancement, Weight trends, Labs, Skin REASON FOR ASSESSMENT:   Consult-assess nutrition status  ASSESSMENT: Patient is an underweight 85 yo male with history of B-12 deficiency, macular degeneration (left eye), CHF and hypertension. He presents from home with discoloration and bleeding of second toe on left foot.   Patient not answering telephone. RD talked with RN. Patient diet has not advanced therefore unable to assess adequacy of po intake or obtain diet history prior to admission.   Patient weight history suspicious for reported value vs actual weight. There are 3 weights since August of 2019 and all are exactly 59 kg.   Medications reviewed and include: rocephin, lopressor.   Labs: BMP Latest Ref Rng & Units 11/24/2018 11/24/2018 11/23/2018  Glucose 70 - 99 mg/dL 93 103(H) 104(H)  BUN 8 - 23 mg/dL 17 18 20   Creatinine 0.61 - 1.24 mg/dL 0.66 0.63 0.68  Sodium 135 - 145 mmol/L 126(L) 123(L) 121(L)  Potassium 3.5 - 5.1 mmol/L 4.4 4.5 4.7  Chloride 98 - 111 mmol/L 94(L) 91(L) 86(L)  CO2 22 - 32 mmol/L 22 19(L) 24  Calcium 8.9 - 10.3 mg/dL 8.2(L) 8.5(L) 8.6(L)      NUTRITION - FOCUSED PHYSICAL EXAM:  Unable to complete Nutrition-Focused physical exam at this time.    Diet Order:   Diet Order            Diet NPO Room service appropriate? Yes; Fluid consistency: Thin  Diet effective now              EDUCATION NEEDS: none identified at this time    Skin:  Skin Assessment: Skin Integrity  Issues: Skin Integrity Issues:: Stage II, Other (Comment) Stage II: coccyx Other: necrotic left second toe  Last BM:  unknown- ostomy  Height:   Ht Readings from Last 1 Encounters:  11/24/18 5\' 11"  (1.803 m)    Weight:   Wt Readings from Last 1 Encounters:  11/24/18 59 kg    Ideal Body Weight:  78 kg  BMI:  Body mass index is 18.14 kg/m.  Estimated Nutritional Needs:   Kcal:  5638-9373 (33-36 kcal/kg/bw)  Protein:  83-94 gr (1.4-1.6 gr/kg/bw)  Fluid:  < 2 liters daily   Colman Cater MS,RD,CSG,LDN Office: [phone] Pager: [phone]

## 2018-11-24 NOTE — Consult Note (Signed)
[facility] Consult  Reason for Consult: Left 2nd toe osteomyelitis  Referring Physician:  Dr. Carles Collet   Chief Complaint    Toe Pain      Jon [name] is a 85 y.o. male.  HPI: Mr. Jon [name] is an 85 yo with multiple medical issues including HTN, Tremor, A fib on blood thinners, pacemaker for tachy-brady syndrome, neurogenic bladder with foley, sacral ulcer who is DNR and is bed bound and has not ambulated in years. He says he has an air bed at home. He has been getting home health RN wound care to his feet/ 2nd toe on the left, and the wound RN noticed changes yesterday that prompted him to be sent to the hospital.  The patient denies any injury to his toe, and has had this wound for a while but cannot give me specifics.  He denies any fever or chills. He denies chest pain or shortness of breath. He has pain in both feet that is chronic. He did have some bleeding of the toe.    Past Medical History:  Diagnosis Date   Abnormality of gait    Arthritis    B12 deficiency    Bilateral hydronephrosis    Bladder calculi    Bleeding ulcer    Dysrhythmia    Essential hypertension    Essential tremor    Gastritis    Macular degeneration of left eye    Memory loss    Multiple rib fractures    Neuropathy    OSA (obstructive sleep apnea)    Does not use CPAP   PAF (paroxysmal atrial fibrillation) (HCC)    Presence of permanent cardiac pacemaker    Tachycardia-bradycardia syndrome (HCC)    [employer] PPM - Dr. Sallyanne Kuster   Varicose veins    Bilateral    Past Surgical History:  Procedure Laterality Date   BRAIN SURGERY     CATARACT EXTRACTION Bilateral 2013   COLON SURGERY     COLOSTOMY N/A 07/11/2014   Procedure: DIVERTING COLOSTOMY;  Surgeon: Jamesetta So, MD;  Location: [facility];  Service: General;  Laterality: N/A;  wound class: clean contaminated   ESOPHAGOGASTRODUODENOSCOPY N/A 05/16/2015   Dr. Oneida Alar: few linear erosions/ulcerastions in  distal esophagus, non-erosive gastritis, no duodenal abnormalities. Path with chronic gastritis, negative H.pylori.    INCISION AND DRAINAGE OF WOUND N/A 07/11/2014   Procedure: DEBRIDEMENT OF SACRUM;  Surgeon: Jamesetta So, MD;  Location: [facility];  Service: General;  Laterality: N/A;   INSERT / REPLACE / REMOVE PACEMAKER     MENINGIOMA REMOVAL  1992   POST RT FRONTAL    PACEMAKER INSERTION  2006   PERMANENT PACEMAKER GENERATOR CHANGE N/A 08/13/2012   Procedure: PERMANENT PACEMAKER GENERATOR CHANGE;  Surgeon: Sanda Klein, MD;  Location: [facility];  Service: Cardiovascular;  Laterality: N/A;    Family History  Problem Relation Age of Onset   Aortic aneurysm Mother    Parkinsonism Mother    Heart disease Father    Esophageal cancer Sister    Colon cancer Neg Hx     Social History   Tobacco Use   Smoking status: Former Smoker    Packs/day: 0.75    Years: 25.00    Pack years: 18.75    Types: Cigarettes   Smokeless tobacco: Never Used   Tobacco comment: QUIT 48 YRS AGO  Substance Use Topics   Alcohol use: No   Drug use: No    Medications:  I have reviewed the  patient's current medications. Prior to Admission:  Medications Prior to Admission  Medication Sig Dispense Refill Last Dose   acetaminophen (TYLENOL) 500 MG tablet Take 500 mg by mouth every 6 (six) hours as needed for mild pain.   11/22/2018 at Unknown time   ALPRAZolam (XANAX) 1 MG tablet Take 0.5 tablets (0.5 mg total) by mouth 2 (two) times daily as needed for anxiety. (Patient taking differently: Take 0.5 mg by mouth 3 (three) times daily as needed for anxiety. ) 30 tablet 0 Past Month at Unknown time   docusate sodium (COLACE) 100 MG capsule Take 100 mg by mouth daily as needed for mild constipation.   Past Week at Unknown time   nystatin (MYCOSTATIN/NYSTOP) powder Apply topically daily as needed (applied to back for yeast).   Past Week at Unknown time   polyethylene glycol (MIRALAX / GLYCOLAX)  17 g packet Take 17 g by mouth daily as needed for mild constipation.   Past Week at Unknown time   Tetrahydrozoline HCl (EYE DROPS OP) Apply 2 drops to eye daily as needed (irritation).   unknown   Scheduled:  metoprolol tartrate  12.5 mg Oral BID   sodium chloride flush  3 mL Intravenous Q12H   Continuous:  sodium chloride     sodium chloride     cefTRIAXone (ROCEPHIN)  IV 2 g (11/24/18 0913)   vancomycin 500 mg (11/24/18 1137)   ZOX:WRUEAV chloride, acetaminophen **OR** acetaminophen, ALPRAZolam, HYDROcodone-acetaminophen, polyethylene glycol, povidone-iodine, sodium chloride flush  Allergies  Allergen Reactions   Iohexol      Desc: PT STATES THROAT/FACE SWELLING W/ IVP DYE IN 1990. PT HAS NOT HAD ANY EXAMS DONE W/DYE SINCE AND HAS NEVER BEEN PRE MEDICATED. PER PT, Onset Date: [id number]    Shellfish Allergy    Dilantin [Phenytoin Sodium Extended] Rash   Tegretol [Carbamazepine] Rash     ROS:  A comprehensive review of systems was negative except for: Constitutional: positive for bedbound, unable to ambulate Musculoskeletal: positive for foot pain, L 2nd toe bleeding/ wound  Blood pressure 105/66, pulse 99, temperature 97.8 F (36.6 C), temperature source Oral, resp. rate 16, height  (1.803 m), weight 59 kg, SpO2 100 %. Physical Exam Vitals signs reviewed.  Constitutional:      Appearance: He is underweight.  HENT:     Head: Normocephalic.     Nose: Nose normal.  Eyes:     Pupils: Pupils are equal, round, and reactive to light.  Cardiovascular:     Rate and Rhythm: Normal rate.     Pulses:          Femoral pulses are 2+ on the right side and 2+ on the left side.      Popliteal pulses are 2+ on the right side and 2+ on the left side.       Dorsalis pedis pulses are 1+ on the right side and 1+ on the left side.     Comments: Palpable pulse on the DP bilaterally Pulmonary:     Effort: Pulmonary effort is normal.  Abdominal:     General: There is no  distension.     Palpations: Abdomen is soft.  Musculoskeletal:        General: No swelling.     Comments: Clubbing / contracture of the feet inward, scaly skin on the lower legs, left 2nd toe with dry black eschar and scant bloody drainage, minimal erythema around the base, not boogy or drainage, heels intact without ulcers or blisters, some  callus on the plantar surface of bilateral feet   Skin:    General: Skin is warm.  Neurological:     General: No focal deficit present.     Mental Status: He is alert and oriented to person, place, and time.  Psychiatric:        Mood and Affect: Mood normal.        Behavior: Behavior normal.        Thought Content: Thought content normal.        Judgment: Judgment normal.       Results: Results for orders placed or performed during the hospital encounter of 11/23/18 (from the past 48 hour(s))  Comprehensive metabolic panel     Status: Abnormal   Collection Time: 11/23/18  4:50 PM  Result Value Ref Range   Sodium 121 (L) 135 - 145 mmol/L   Potassium 4.7 3.5 - 5.1 mmol/L   Chloride 86 (L) 98 - 111 mmol/L   CO2 24 22 - 32 mmol/L   Glucose, Bld 104 (H) 70 - 99 mg/dL   BUN 20 8 - 23 mg/dL   Creatinine, Ser 1.610.68 0.61 - 1.24 mg/dL   Calcium 8.6 (L) 8.9 - 10.3 mg/dL   Total Protein 6.9 6.5 - 8.1 g/dL   Albumin 3.3 (L) 3.5 - 5.0 g/dL   AST 17 15 - 41 U/L   ALT 10 0 - 44 U/L   Alkaline Phosphatase 80 38 - 126 U/L   Total Bilirubin 0.9 0.3 - 1.2 mg/dL   GFR calc non Af Amer >60 >60 mL/min   GFR calc Af Amer >60 >60 mL/min   Anion gap 11 5 - 15    Comment: Performed at [name], [address] [place], [place] [address]  CBC with Differential     Status: Abnormal   Collection Time: 11/23/18  4:50 PM  Result Value Ref Range   WBC 8.2 4.0 - 10.5 K/uL   RBC 4.80 4.22 - 5.81 MIL/uL   Hemoglobin 12.6 (L) 13.0 - 17.0 g/dL   HCT 54.038.6 (L) 98.139.0 - 19.152.0 %   MCV 80.4 80.0 - 100.0 fL   MCH 26.3 26.0 - 34.0 pg   MCHC 32.6 30.0 - 36.0 g/dL   RDW  47.818.3 (H) 29.511.5 - 15.5 %   Platelets 255 150 - 400 K/uL   nRBC 0.0 0.0 - 0.2 %   Neutrophils Relative % 76 %   Neutro Abs 6.2 1.7 - 7.7 K/uL   Lymphocytes Relative 11 %   Lymphs Abs 0.9 0.7 - 4.0 K/uL   Monocytes Relative 8 %   Monocytes Absolute 0.7 0.1 - 1.0 K/uL   Eosinophils Relative 4 %   Eosinophils Absolute 0.3 0.0 - 0.5 K/uL   Basophils Relative 1 %   Basophils Absolute 0.0 0.0 - 0.1 K/uL   Immature Granulocytes 0 %   Abs Immature Granulocytes 0.02 0.00 - 0.07 K/uL    Comment: Performed at [name], [address] [place], [place] [address]  Lactic acid, plasma     Status: None   Collection Time: 11/23/18  4:50 PM  Result Value Ref Range   Lactic Acid, Venous 1.2 0.5 - 1.9 mmol/L    Comment: Performed at [name], [address] [place], [place] [address]  Culture, blood (routine x 2)     Status: None (Preliminary result)   Collection Time: 11/23/18  4:50 PM  Result Value Ref Range   Specimen Description BLOOD RIGHT ARM  Special Requests      BOTTLES DRAWN AEROBIC AND ANAEROBIC Blood Culture adequate volume   Culture      NO GROWTH < 24 HOURS Performed at [facility], [address] [place], [place] [address]    Report Status PENDING   Culture, blood (routine x 2)     Status: None (Preliminary result)   Collection Time: 11/23/18  4:59 PM  Result Value Ref Range   Specimen Description BLOOD LEFT WRIST    Special Requests      BOTTLES DRAWN AEROBIC AND ANAEROBIC Blood Culture adequate volume   Culture      NO GROWTH < 24 HOURS Performed at [facility], [address] [place], [place] [address]    Report Status PENDING   SARS Coronavirus 2 (CEPHEID - Performed in [facility] hospital lab), Hosp Order     Status: None   Collection Time: 11/23/18  5:27 PM  Result Value Ref Range   SARS Coronavirus 2 NEGATIVE NEGATIVE    Comment: (NOTE) If result is NEGATIVE SARS-CoV-2 target nucleic acids are NOT DETECTED. The SARS-CoV-2 RNA is generally detectable in  upper and lower  respiratory specimens during the acute phase of infection. The lowest  concentration of SARS-CoV-2 viral copies this assay can detect is 250  copies / mL. A negative result does not preclude SARS-CoV-2 infection  and should not be used as the sole basis for treatment or other  patient management decisions.  A negative result may occur with  improper specimen collection / handling, submission of specimen other  than nasopharyngeal swab, presence of viral mutation(s) within the  areas targeted by this assay, and inadequate number of viral copies  (<250 copies / mL). A negative result must be combined with clinical  observations, patient history, and epidemiological information. If result is POSITIVE SARS-CoV-2 target nucleic acids are DETECTED. The SARS-CoV-2 RNA is generally detectable in upper and lower  respiratory specimens dur ing the acute phase of infection.  Positive  results are indicative of active infection with SARS-CoV-2.  Clinical  correlation with patient history and other diagnostic information is  necessary to determine patient infection status.  Positive results do  not rule out bacterial infection or co-infection with other viruses. If result is PRESUMPTIVE POSTIVE SARS-CoV-2 nucleic acids MAY BE PRESENT.   A presumptive positive result was obtained on the submitted specimen  and confirmed on repeat testing.  While 2019 novel coronavirus  (SARS-CoV-2) nucleic acids may be present in the submitted sample  additional confirmatory testing may be necessary for epidemiological  and / or clinical management purposes  to differentiate between  SARS-CoV-2 and other Sarbecovirus currently known to infect humans.  If clinically indicated additional testing with an alternate test  methodology [phone]) is advised. The SARS-CoV-2 RNA is generally  detectable in upper and lower respiratory sp ecimens during the acute  phase of infection. The expected result is  Negative. Fact Sheet for Patients:  BoilerBrush.com.cy Fact Sheet for Healthcare Providers: https://pope.com/ This test is not yet approved or cleared by the [place] FDA and has been authorized for detection and/or diagnosis of SARS-CoV-2 by FDA under an Emergency Use Authorization (EUA).  This EUA will remain in effect (meaning this test can be used) for the duration of the COVID-19 declaration under Section 564(b)(1) of the Act, 21 U.S.C. section 360bbb-3(b)(1), unless the authorization is terminated or revoked sooner. Performed at [facility], [address] [place], [place] [address]   Urinalysis, Routine w reflex  microscopic     Status: Abnormal   Collection Time: 11/23/18  5:32 PM  Result Value Ref Range   Color, Urine YELLOW YELLOW   APPearance CLOUDY (A) CLEAR   Specific Gravity, Urine 1.008 1.005 - 1.030   pH 8.0 5.0 - 8.0   Glucose, UA NEGATIVE NEGATIVE mg/dL   Hgb urine dipstick SMALL (A) NEGATIVE   Bilirubin Urine NEGATIVE NEGATIVE   Ketones, ur NEGATIVE NEGATIVE mg/dL   Protein, ur NEGATIVE NEGATIVE mg/dL   Nitrite POSITIVE (A) NEGATIVE   Leukocytes,Ua LARGE (A) NEGATIVE   RBC / HPF 11-20 0 - 5 RBC/hpf   WBC, UA >50 (H) 0 - 5 WBC/hpf   Bacteria, UA RARE (A) NONE SEEN   WBC Clumps PRESENT     Comment: Performed at [name], [address] [place], [place] [address]  Lactic acid, plasma     Status: None   Collection Time: 11/23/18  6:30 PM  Result Value Ref Range   Lactic Acid, Venous 1.6 0.5 - 1.9 mmol/L    Comment: Performed at [name], [address] [place], [place] [address]  Basic metabolic panel     Status: Abnormal   Collection Time: 11/24/18 12:06 AM  Result Value Ref Range   Sodium 123 (L) 135 - 145 mmol/L   Potassium 4.5 3.5 - 5.1 mmol/L   Chloride 91 (L) 98 - 111 mmol/L   CO2 19 (L) 22 - 32 mmol/L   Glucose, Bld 103 (H) 70 - 99 mg/dL   BUN 18 8 - 23 mg/dL   Creatinine, Ser 0.980.63 0.61 -  1.24 mg/dL   Calcium 8.5 (L) 8.9 - 10.3 mg/dL   GFR calc non Af Amer >60 >60 mL/min   GFR calc Af Amer >60 >60 mL/min   Anion gap 13 5 - 15    Comment: Performed at [name], [address] [place], [place] [address]  CBC WITH DIFFERENTIAL     Status: Abnormal   Collection Time: 11/24/18 12:06 AM  Result Value Ref Range   WBC 8.0 4.0 - 10.5 K/uL   RBC 4.89 4.22 - 5.81 MIL/uL   Hemoglobin 13.0 13.0 - 17.0 g/dL   HCT 78.240.3 95.639.0 - 21.352.0 %   MCV 82.4 80.0 - 100.0 fL   MCH 26.6 26.0 - 34.0 pg   MCHC 32.3 30.0 - 36.0 g/dL   RDW 08.618.2 (H) 57.811.5 - 46.915.5 %   Platelets 241 150 - 400 K/uL   nRBC 0.0 0.0 - 0.2 %   Neutrophils Relative % 85 %   Neutro Abs 6.9 1.7 - 7.7 K/uL   Lymphocytes Relative 7 %   Lymphs Abs 0.5 (L) 0.7 - 4.0 K/uL   Monocytes Relative 4 %   Monocytes Absolute 0.3 0.1 - 1.0 K/uL   Eosinophils Relative 2 %   Eosinophils Absolute 0.2 0.0 - 0.5 K/uL   Basophils Relative 1 %   Basophils Absolute 0.0 0.0 - 0.1 K/uL   Immature Granulocytes 1 %   Abs Immature Granulocytes 0.05 0.00 - 0.07 K/uL    Comment: Performed at [name], [address] [place], [place] [address]  C-reactive protein     Status: Abnormal   Collection Time: 11/24/18 12:06 AM  Result Value Ref Range   CRP 1.9 (H) <1.0 mg/dL    Comment: Performed at [name], [address] [place], [place] [address]    [place] Arterial Vanice Sarahbi (screening Lower Extremity)  Result Date: 11/24/2018 CLINICAL DATA:  85 year old male  with foot ulcers EXAM: NONINVASIVE PHYSIOLOGIC VASCULAR STUDY OF BILATERAL LOWER EXTREMITIES TECHNIQUE: Evaluation of both lower extremities was performed at rest, including calculation of ankle-brachial indices, multiple segmental pressure evaluation, segmental Doppler and segmental pulse volume recording. COMPARISON:  None. FINDINGS: Right: Resting ankle brachial index:  1.56 Segmental blood pressure: Symmetric upper extremity pressures Doppler: Segmental Doppler at the right ankle demonstrates  triphasic waveform of the posterior tibial artery and dorsalis pedis. Left: Resting ankle brachial index: Non attainable secondary to noncompression Segmental blood pressure: Symmetric upper extremity pressures Doppler: Segmental Doppler at the left ankle demonstrates biphasic waveform of the posterior tibial artery and dorsalis pedis. Additional: IMPRESSION: Resting ABI the right lower extremity is elevated, likely secondary to noncompression, with unobtainable ABI on the left. Segmental Doppler at the bilateral lower extremities demonstrates normal waveform on the right and biphasic waveform on the left. Signed, Yvone NeuJaime S. Reyne DumasWagner, DO, RPVI [facility] Electronically Signed   By: Gilmer MorJaime  Wagner D.O.   On: 11/24/2018 08:48   Dg Chest Portable 1 View  Result Date: 11/23/2018 CLINICAL DATA:  85 year old male with history of cellulitis of the left second toes. EXAM: PORTABLE CHEST 1 VIEW COMPARISON:  Chest x-ray 06/03/2017. FINDINGS: Lung volumes are normal. No consolidative airspace disease. No pleural effusions. No pneumothorax. No pulmonary nodule or mass noted. Pulmonary vasculature and the cardiomediastinal silhouette are within normal limits. Atherosclerosis in the thoracic aorta. Left-sided pacemaker device in place with lead tips projecting over the expected location of the right atrium and right ventricle. IMPRESSION: 1.  No radiographic evidence of acute cardiopulmonary disease. 2. Aortic atherosclerosis. Electronically Signed   By: Trudie Reedaniel  Entrikin M.D.   On: 11/23/2018 18:01   Dg Foot Complete Left  Result Date: 11/23/2018 CLINICAL DATA:  Cellulitis of the second toe EXAM: LEFT FOOT - COMPLETE 3+ VIEW COMPARISON:  None. FINDINGS: Markedly severe bony demineralization, with hyperflexion of the MTP joints of the second through fifth digits leading to bony overlap and very poor definition of the phalanges. Difficult standard positioning of the foot.  No obvious dislocation at the Lisfranc joint. We were not able to obtain a true lateral projection. Degenerative midfoot findings. In the second toe there is severe distal phalangeal bony demineralization probably from osteomyelitis. Poor definition of the middle phalanx. Suspected fracture of the distal metaphysis and head of the proximal phalanx. Other phalanges including the fourth and fifth toe distal phalanges are highly indistinct. IMPRESSION: 1. Severe demineralization of the distal phalanx second toe probably from osteomyelitis. 2. Probable fracture of the distal metaphysis and head of the proximal phalanx second toe. Poor visualization of the distal phalanges of the fourth and fifth toes, significance uncertain. 3. Severe osteoporosis/bony demineralization. 4. Foot deformities make standard projections problematic. We were unable to obtain standard views. Electronically Signed   By: Gaylyn RongWalter  Liebkemann M.D.   On: 11/23/2018 18:04     Assessment & Plan:  [name] Artus is a 85 y.o. 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 This is going to auto-amputate at some point. Unlikely that this has not been going on for a little longer than described. He has some minor bleeding but no drainage or booginess.  Bed bound and does not ambulate. His heels are intact which is good considering his bedbound status.  He has an abnormal ABI but I can definitely palpate the DP bilaterally.  Given his bed bound status he is likely not to be a revascularization candidate because he  does not ambulate and would only really need to be revascularized if he had a wound to heal. At this time, I would let the dry gangrene progress and auto-amputate. We can aid in keeping it dry with betadine paint daily to the area.  If he came to needing any type of amputation, I would fear that a AKA would be in his best interest to prevent ulcer/ pressure wounds of his stump.    -Betadine paint to the 2nd toe daily and alcohol swab  around the toe to wick away moisture -Agree with antibiotics given slight redness around and can transition to po as outpatient, the osteomyelitis is inevitable given the dry gangrene but again this will autoamputate, would not recommended prolonged antibiotics as this toe is not salvagable  -Family needs to be educated that this will fall off and continue wound care to the wound following with betadine -Ultimately he could be referred to Vascular to discuss options but given DNR status/ bedbound status and co-morbidities I am not sure how much they can offer   All questions were answered to the satisfaction of the patient.  Discussed with Dr. Arbutus Leas.   Lucretia Roers 11/24/2018, 9:25 AM

## 2018-11-24 NOTE — Progress Notes (Signed)
PROGRESS NOTE  Jon CutterWilliam P [name] JXB:[id number] DOB: 1931-12-20 DOA: 11/23/2018 PCP: Jon [name], Steve, MD  Brief History:  85 y/o male with a history of paroxysmal atrial fibrillation, tachybradycardia syndrome status post permanent pacemaker 2006, diastolic CHF, hypertension, hyponatremia, sacral decubitus ulcer, chronic indwelling Foley catheter, and dementia presenting with bleeding and discoloration of his left second toe.  The patient was recently admitted to the hospital from 07/28/2018 through 08/02/2018 at which time the patient was treated for a penoscrotal abscess.  The patient finished antibiotics during the hospitalization and was discharged in stable condition.  He was previously in residential hospice in December 2018.  However, the patient was stabilized and was discharged home.  He has been at home since his period of time.  His son states that he has had a wound in his left second toe for at least a month.  They have been treating it locally at home with wound dressings.  However, in the past 2 to 3 days they noted increasing discoloration and bleeding.  As result, the patient was brought to emergency department for further evaluation.  In the ED, the patient was afebrile with soft blood pressures.  He had oxygen saturation 99% on room air.  WBC was 8.2.  BMP showed a serum sodium 121.  LFTs were unremarkable.  X-ray of his left foot showed severe demineralization of the distal phalanx of the left second toe with probable osteomyelitis.  The patient was empirically started on vancomycin and cefepime.  Assessment/Plan: Dry gangrene left second toe/contiguous focus osteomyelitis -Certainly, the patient may have a degree of surrounding cellulitis -Primary issue is likely a peripheral vascular issue -General surgery consultation -Unclear if the patient is a good surgical candidate at this point given his comorbidities and likely underlying PVD -Continue empiric vancomycin and  ceftriaxone -If there is no surgical intervention, would opt for short course of oral antibiotics at home primarily for mild surrounding cellulitis -If the patient does not have surgery, will send home with local wound care with Betadine  Hyponatremia -Patient appears volume depleted -Due to volume depletion and poor solute intake -Continue IV fluids -Holding furosemide  Catheter associated UTI -Patient has chronic indwelling Foley catheter secondary to neurogenic bladder -Foley catheter changed 11/23/2018 -Continue empiric ceftriaxone pending urine culture data  Tachybradycardia syndrome -Status post permanent pacemaker -Presently in sinus rhythm  Paroxysmal atrial fibrillation CHADS-VASc at least 4 (age x2, CHF, HTN) - Not anticoagulated d/t bleeding hx  - Hold ASA pending surgeon's evaluation, -Not on anticoagulation secondary to bleeding history -Holding metoprolol secondary to hypotension  Chronic diastolic CHF -Appears clinically dry -Holding furosemide -Holding metoprolol  Severe protein calorie malnutrition  -Nutrition consultation  Anxiety -continue home dose alprazolam     Disposition Plan:   Home in 1-2 days  Family Communication:   Son updated on phone 6/9  Consultants:  General surgery  Code Status:   DNR  DVT Prophylaxis:  Jon [name] Heparin   Procedures: As Listed in Progress Note Above  Antibiotics: vanco 6/8>>> Ceftriaxone 6/9>>>     Subjective: Patient denies fevers, chills, headache, chest pain, dyspnea, nausea, vomiting, diarrhea, abdominal pain, dysuria, hematuria, hematochezia, and melena.   Objective: Vitals:   11/24/18 0500 11/24/18 0530 11/24/18 0600 11/24/18 0700  BP: 94/60 (!) 89/50 (!) 81/60 (!) 114/50  Pulse: 66 71 67 63  Resp:    16  Temp:    97.8 F (36.6 C)  TempSrc:    Oral  SpO2: 98% 91% 97% 99%  Weight:      Height:        Intake/Output Summary (Last 24 hours) at 11/24/2018 0732 Last data filed at 11/24/2018 0604  Gross per 24 hour  Intake 705 ml  Output 700 ml  Net 5 ml   Weight change:  Exam:   General:  Pt is alert, follows commands appropriately, not in acute distress  HEENT: No icterus, No thrush, No neck mass, [place]/AT  Cardiovascular: RRR, S1/S2, no rubs, no gallops  Respiratory: R-basilar crackle, L-CTA  Abdomen: Soft/+BS, non tender, non distended, no guarding  Extremities: No edema, No lymphangitis, No petechiae, No rashes, no synovitis  Left foot       Data Reviewed: I have personally reviewed following labs and imaging studies Basic Metabolic Panel: Recent Labs  Lab 11/23/18 1650 11/24/18 0006  NA 121* 123*  K 4.7 4.5  CL 86* 91*  CO2 24 19*  GLUCOSE 104* 103*  BUN 20 18  CREATININE 0.68 0.63  CALCIUM 8.6* 8.5*   Liver Function Tests: Recent Labs  Lab 11/23/18 1650  AST 17  ALT 10  ALKPHOS 80  BILITOT 0.9  PROT 6.9  ALBUMIN 3.3*   No results for input(s): LIPASE, AMYLASE in the last 168 hours. No results for input(s): AMMONIA in the last 168 hours. Coagulation Profile: No results for input(s): INR, PROTIME in the last 168 hours. CBC: Recent Labs  Lab 11/23/18 1650 11/24/18 0006  WBC 8.2 8.0  NEUTROABS 6.2 6.9  HGB 12.6* 13.0  HCT 38.6* 40.3  MCV 80.4 82.4  PLT 255 241   Cardiac Enzymes: No results for input(s): CKTOTAL, CKMB, CKMBINDEX, TROPONINI in the last 168 hours. BNP: Invalid input(s): POCBNP CBG: No results for input(s): GLUCAP in the last 168 hours. HbA1C: No results for input(s): HGBA1C in the last 72 hours. Urine analysis:    Component Value Date/Time   COLORURINE YELLOW 11/23/2018 1732   APPEARANCEUR CLOUDY (A) 11/23/2018 1732   LABSPEC 1.008 11/23/2018 1732   PHURINE 8.0 11/23/2018 1732   GLUCOSEU NEGATIVE 11/23/2018 1732   HGBUR SMALL (A) 11/23/2018 1732   BILIRUBINUR NEGATIVE 11/23/2018 1732   KETONESUR NEGATIVE 11/23/2018 1732   PROTEINUR NEGATIVE 11/23/2018 1732   UROBILINOGEN 0.2 03/14/2015 1515   NITRITE  POSITIVE (A) 11/23/2018 1732   LEUKOCYTESUR LARGE (A) 11/23/2018 1732   Sepsis Labs: @LABRCNTIP (procalcitonin:4,lacticidven:4) ) Recent Results (from the past 240 hour(s))  Culture, blood (routine x 2)     Status: None (Preliminary result)   Collection Time: 11/23/18  4:50 PM  Result Value Ref Range Status   Specimen Description BLOOD RIGHT ARM  Final   Special Requests   Final    BOTTLES DRAWN AEROBIC AND ANAEROBIC Blood Culture adequate volume Performed at [facility], [address], [place], [place] [address]    Culture PENDING  Incomplete   Report Status PENDING  Incomplete  Culture, blood (routine x 2)     Status: None (Preliminary result)   Collection Time: 11/23/18  4:59 PM  Result Value Ref Range Status   Specimen Description BLOOD LEFT WRIST  Final   Special Requests   Final    BOTTLES DRAWN AEROBIC AND ANAEROBIC Blood Culture adequate volume Performed at [facility], [address], [place],  [address]    Culture PENDING  Incomplete   Report Status PENDING  Incomplete  SARS Coronavirus 2 (CEPHEID - Performed in [place] hospital lab), Hosp Order     Status: None  Collection Time: 11/23/18  5:27 PM  Result Value Ref Range Status   SARS Coronavirus 2 NEGATIVE NEGATIVE Final    Comment: (NOTE) If result is NEGATIVE SARS-CoV-2 target nucleic acids are NOT DETECTED. The SARS-CoV-2 RNA is generally detectable in upper and lower  respiratory specimens during the acute phase of infection. The lowest  concentration of SARS-CoV-2 viral copies this assay can detect is 250  copies / mL. A negative result does not preclude SARS-CoV-2 infection  and should not be used as the sole basis for treatment or other  patient management decisions.  A negative result may occur with  improper specimen collection / handling, submission of specimen other  than nasopharyngeal swab, presence of viral mutation(s) within the  areas targeted by this assay, and inadequate number of  viral copies  (<250 copies / mL). A negative result must be combined with clinical  observations, patient history, and epidemiological information. If result is POSITIVE SARS-CoV-2 target nucleic acids are DETECTED. The SARS-CoV-2 RNA is generally detectable in upper and lower  respiratory specimens dur ing the acute phase of infection.  Positive  results are indicative of active infection with SARS-CoV-2.  Clinical  correlation with patient history and other diagnostic information is  necessary to determine patient infection status.  Positive results do  not rule out bacterial infection or co-infection with other viruses. If result is PRESUMPTIVE POSTIVE SARS-CoV-2 nucleic acids MAY BE PRESENT.   A presumptive positive result was obtained on the submitted specimen  and confirmed on repeat testing.  While 2019 novel coronavirus  (SARS-CoV-2) nucleic acids may be present in the submitted sample  additional confirmatory testing may be necessary for epidemiological  and / or clinical management purposes  to differentiate between  SARS-CoV-2 and other Sarbecovirus currently known to infect humans.  If clinically indicated additional testing with an alternate test  methodology [phone]([id number]) is advised. The SARS-CoV-2 RNA is generally  detectable in upper and lower respiratory sp ecimens during the acute  phase of infection. The expected result is Negative. Fact Sheet for Patients:  BoilerBrush.com.cyhttps://www.fda.gov/media/136312/download Fact Sheet for Healthcare Providers: https://pope.com/https://www.fda.gov/media/136313/download This test is not yet approved or cleared by the [place] FDA and has been authorized for detection and/or diagnosis of SARS-CoV-2 by FDA under an Emergency Use Authorization (EUA).  This EUA will remain in effect (meaning this test can be used) for the duration of the COVID-19 declaration under Section 564(b)(1) of the Act, 21 U.S.C. section 360bbb-3(b)(1), unless the authorization is  terminated or revoked sooner. Performed at [name], [address] [place], [place] [address]      Scheduled Meds: . metoprolol tartrate  12.5 mg Oral BID  . sodium chloride flush  3 mL Intravenous Q12H   Continuous Infusions: . sodium chloride    . sodium chloride 75 mL/hr (11/24/18 0406)  . ceFEPime (MAXIPIME) IV 2 g (11/24/18 0525)  . vancomycin      Procedures/Studies: Dg Chest Portable 1 View  Result Date: 11/23/2018 CLINICAL DATA:  85 year old male with history of cellulitis of the left second toes. EXAM: PORTABLE CHEST 1 VIEW COMPARISON:  Chest x-ray 06/03/2017. FINDINGS: Lung volumes are normal. No consolidative airspace disease. No pleural effusions. No pneumothorax. No pulmonary nodule or mass noted. Pulmonary vasculature and the cardiomediastinal silhouette are within normal limits. Atherosclerosis in the thoracic aorta. Left-sided pacemaker device in place with lead tips projecting over the expected location of the right atrium and right ventricle. IMPRESSION: 1.  No radiographic evidence of acute cardiopulmonary  disease. 2. Aortic atherosclerosis. Electronically Signed   By: Trudie Reedaniel  Entrikin M.D.   On: 11/23/2018 18:01   Dg Foot Complete Left  Result Date: 11/23/2018 CLINICAL DATA:  Cellulitis of the second toe EXAM: LEFT FOOT - COMPLETE 3+ VIEW COMPARISON:  None. FINDINGS: Markedly severe bony demineralization, with hyperflexion of the MTP joints of the second through fifth digits leading to bony overlap and very poor definition of the phalanges. Difficult standard positioning of the foot. No obvious dislocation at the Lisfranc joint. We were not able to obtain a true lateral projection. Degenerative midfoot findings. In the second toe there is severe distal phalangeal bony demineralization probably from osteomyelitis. Poor definition of the middle phalanx. Suspected fracture of the distal metaphysis and head of the proximal phalanx. Other phalanges including the fourth and  fifth toe distal phalanges are highly indistinct. IMPRESSION: 1. Severe demineralization of the distal phalanx second toe probably from osteomyelitis. 2. Probable fracture of the distal metaphysis and head of the proximal phalanx second toe. Poor visualization of the distal phalanges of the fourth and fifth toes, significance uncertain. 3. Severe osteoporosis/bony demineralization. 4. Foot deformities make standard projections problematic. We were unable to obtain standard views. Electronically Signed   By: Gaylyn RongWalter  Liebkemann M.D.   On: 11/23/2018 18:04    Catarina Hartshornavid Hayven Fatima, DO  [facility] Pager [phone]  If 7PM-7AM, please contact night-coverage [email or web address] Password [email or web address] 11/24/2018, 7:32 AM   LOS: 0 days

## 2018-11-24 NOTE — TOC Initial Note (Signed)
Transition of Care [facility](TOC) - Initial/Assessment Note    Patient Details  Name: Jon CutterWilliam P [name] MRN: [id number] Date of Birth: [birth year]  Transition of Care [facility](TOC) CM/SW Contact:    Nykayla Marcelli, [name], RN Phone Number: 11/24/2018, 2:37 PM  Clinical Narrative:    Osteomyelitis. From home with son. Son is 24/7 care giver. Patient has been at residential hospice and then home with hospice and has since graduated to home health. [facility]. Discussed Hospice referral with son Tawanna Cooler-[name], he is agreeable to what ever is appropriate. Will resume home health and send referral for home hospice consultation. Discussed with [facility] rep Clydie BraunKaren.  General surgery consulted, per notes toe will auto-amputate.  Potential DC 11/25/18.  TOC to follow.            Expected Discharge Plan: Home w Home Health Services Barriers to Discharge: No Barriers Identified   Patient Goals and CMS Choice Patient states their goals for this hospitalization and ongoing recovery are:: son would like patient to come home      Expected Discharge Plan and Services Expected Discharge Plan: Home w Home Health Services   Discharge Planning Services: CM Consult Post Acute Care Choice: Home Health, Resumption of Svcs/PTA Provider                             [facility] Agency Contacted: 11/24/18 Time HH Agency Contacted: [phone] [facility] Clydie BraunKaren  Prior Living Arrangements/Services   Lives with:: Adult Children Patient language and need for interpreter reviewed:: Yes Do you feel safe going back to the place where you live?: Yes      Need for Family Participation in Patient Care: Yes (Comment) Care giver support system in place?: Yes (comment) Current home services: DME(WC, BSC, Lift, table, hospital bed with air mattress) Criminal Activity/Legal Involvement Pertinent to Current  Situation/Hospitalization: Yes - Comment as needed  Activities of Daily Living Home Assistive Devices/Equipment: Other (Comment), Ostomy supplies, Hospital bed, Hoyer Lift(air mattress) ADL Screening (condition at time of admission) Patient's cognitive ability adequate to safely complete daily activities?: No Is the patient deaf or have difficulty hearing?: No Does the patient have difficulty seeing, even when wearing glasses/contacts?: No Does the patient have difficulty concentrating, remembering, or making decisions?: No Patient able to express need for assistance with ADLs?: Yes Does the patient have difficulty dressing or bathing?: Yes Independently performs ADLs?: No Communication: Independent Dressing (OT): Dependent Is this a change from baseline?: Pre-admission baseline Grooming: Dependent Is this a change from baseline?: Pre-admission baseline Feeding: Needs assistance Is this a change from baseline?: Pre-admission baseline Bathing: Dependent Is this a change from baseline?: Pre-admission baseline Toileting: Dependent Is this a change from baseline?: Pre-admission baseline In/Out Bed: Dependent Is this a change from baseline?: Pre-admission baseline Walks in Home: Dependent Is this a change from baseline?: Pre-admission baseline Does the patient have difficulty walking or climbing stairs?: Yes Weakness of Legs: Both Weakness of Arms/Hands: None  Permission Sought/Granted   Permission granted to share information with : Yes, Verbal Permission Granted     Permission granted to share info w AGENCY: [facility]        Emotional Assessment              Admission diagnosis:  Skin ulcers of foot, bilateral (HCC) [L97.519, L97.529] Urinary tract  infection without hematuria, site unspecified [N39.0] Osteomyelitis of second toe of left foot [facility]) [M86.9] Patient Active Problem List   Diagnosis Date Noted  . Gangrene of left foot ([place])  11/24/2018  . Toe osteomyelitis, left ([place]) 11/24/2018  . Dry gangrene ([place])   . Osteomyelitis of second toe of left foot ([place]) 11/23/2018  . Chronic diastolic CHF (congestive heart failure) ([place]) 11/23/2018  . Scrotal abscess 07/28/2018  . Dyspnea   . Decubitus ulcer of sacral region, stage 4 ([place]) 06/04/2017  . Severe protein-calorie malnutrition ([place]) 06/03/2017  . Acute metabolic encephalopathy 82/50/5397  . Hypothermia 05/31/2017  . Encounter for hospice care discussion   . Weak 05/27/2017  . Lethargy 05/27/2017  . Leukocytosis   . N&V (nausea and vomiting)   . Goals of care, counseling/discussion   . DNR (do not resuscitate) discussion   . Palliative care encounter   . Pressure injury of skin 08/19/2016  . Sepsis  08/19/2016  . Gastric distention   . Heme positive stool   . UGI bleed 08/18/2016  . Sacral decubitus ulcer, stage IV ([place]) 2020/06/1915  . Epistaxis 05/10/2016  . Diarrhea 05/02/2016  . Prolonged QT interval   . PAF (paroxysmal atrial fibrillation) ([place])   . Tachycardia-bradycardia syndrome ([place])   . Cardiac pacemaker in situ   . Hematemesis 05/15/2015  . Gastrointestinal hemorrhage 05/15/2015  . Malnutrition of moderate degree ([place]) 07/11/2014  . Perianal infection 07/06/2014  . Decubitus ulcer 07/06/2014  . Sacral ulcer ([place]) 07/06/2014  . Anemia, chronic disease 06/16/2014  . Sepsis ([place]) 06/15/2014  . Lower urinary tract infectious disease 06/15/2014  . Acute kidney injury ([place]) 06/15/2014  . Bilateral hydronephrosis 06/15/2014  . Bladder calculi 06/15/2014  . Rhabdomyolysis 06/15/2014  . Hyponatremia 06/15/2014  . Multiple rib fractures 06/15/2014  . Hypotension 06/15/2014  . S/P placement of cardiac pacemaker 06/15/2014  . Dementia ([place]) 06/15/2014  . AKI (acute kidney injury) ([place]) 06/15/2014  . Knee pain   . Severe dehydration 06/09/2014  . Hypertension 06/09/2014  . Abnormality of gait 05/03/2013  . Essential and other specified forms of  tremor 05/03/2013  . Polyneuropathy in other diseases classified elsewhere ([place]) 05/03/2013  . Atrial fibrillation ([place]) 09/07/2012  . Long term current use of anticoagulant therapy 09/07/2012   PCP:  Lemmie Evens, MD Pharmacy:   [facility] [address] [place] [address] Phone: [phone] Fax: [phone]     Social Determinants of Health (SDOH) Interventions    Readmission Risk Interventions No flowsheet data found.

## 2018-11-25 LAB — CBC
HCT: 32.8 % — ABNORMAL LOW (ref 39.0–52.0)
Hemoglobin: 10.7 g/dL — ABNORMAL LOW (ref 13.0–17.0)
MCH: 26.8 pg (ref 26.0–34.0)
MCHC: 32.6 g/dL (ref 30.0–36.0)
MCV: 82.2 fL (ref 80.0–100.0)
Platelets: 233 10*3/uL (ref 150–400)
RBC: 3.99 MIL/uL — ABNORMAL LOW (ref 4.22–5.81)
RDW: 18.4 % — ABNORMAL HIGH (ref 11.5–15.5)
WBC: 7 10*3/uL (ref 4.0–10.5)
nRBC: 0 % (ref 0.0–0.2)

## 2018-11-25 LAB — BASIC METABOLIC PANEL
Anion gap: 8 (ref 5–15)
BUN: 23 mg/dL (ref 8–23)
CO2: 20 mmol/L — ABNORMAL LOW (ref 22–32)
Calcium: 8 mg/dL — ABNORMAL LOW (ref 8.9–10.3)
Chloride: 100 mmol/L (ref 98–111)
Creatinine, Ser: 0.77 mg/dL (ref 0.61–1.24)
GFR calc Af Amer: >60 mL/min (ref >60)
GFR calc non Af Amer: >60 mL/min (ref >60)
Glucose, Bld: 100 mg/dL — ABNORMAL HIGH (ref 70–99)
Potassium: 4.5 mmol/L (ref 3.5–5.1)
Sodium: 128 mmol/L — ABNORMAL LOW (ref 135–145)

## 2018-11-25 LAB — TSH: TSH: 4.663 u[IU]/mL — ABNORMAL HIGH (ref 0.350–4.500)

## 2018-11-25 LAB — T4, FREE: Free T4: 1.09 ng/dL (ref 0.61–1.12)

## 2018-11-25 LAB — CULTURE, BLOOD (ROUTINE X 2): Special Requests: ADEQUATE

## 2018-11-25 LAB — SEDIMENTATION RATE: Sed Rate: 34 mm/hr — ABNORMAL HIGH (ref 0–16)

## 2018-11-25 LAB — C-REACTIVE PROTEIN: CRP: 7.2 mg/dL — ABNORMAL HIGH (ref <1.0)

## 2018-11-25 LAB — CORTISOL-AM, BLOOD: Cortisol - AM: 11.2 ug/dL (ref 6.7–22.6)

## 2018-11-25 LAB — SODIUM, URINE, RANDOM: Sodium, Ur: 39 mmol/L

## 2018-11-25 LAB — GLUCOSE, CAPILLARY: Glucose-Capillary: 98 mg/dL (ref 70–99)

## 2018-11-25 MED ORDER — DIPHENHYDRAMINE HCL 25 MG PO CAPS
25.0000 mg | ORAL_CAPSULE | Freq: Three times a day (TID) | ORAL | Status: AC | PRN
  Administered 2018-11-25 – 2018-11-27 (×2): 25 mg via ORAL
  Filled 2018-11-25 (×2): qty 1

## 2018-11-25 MED ORDER — SODIUM CHLORIDE 0.9 % IV BOLUS
500.0000 mL | Freq: Once | INTRAVENOUS | Status: AC
  Administered 2018-11-25: 500 mL via INTRAVENOUS

## 2018-11-25 NOTE — Progress Notes (Signed)
[facility] Progress Note     Subjective: Patient seen today and toe is more boggy and posterior portion has separate from the dry necrosis part anterior.  Some drainage noted.  Patient has no complaints other than both legs hurting. Blood culture X 1 was positive yesterday with MRSA.   Objective: Vital signs in last 24 hours: Temp:  [94.3 F (34.6 C)-98.7 F (37.1 C)] 97.9 F (36.6 C) (06/10 1200) Pulse Rate:  [59-92] 74 (06/10 1200) Resp:  [16] 16 (06/10 1200) BP: (73-100)/(47-70) 80/57 (06/10 1200) SpO2:  [71 %-100 %] 99 % (06/10 1200) Weight:  [62.7 kg] 62.7 kg (06/10 0500)    Intake/Output from previous day: 06/09 0701 - 06/10 0700 In: 2314.8 [P.O.:360; I.V.:926.8; IV Piggyback:1028] Out: 1200 [Urine:1200] Intake/Output this shift: Total I/O In: 480 [P.O.:480] Out: -   General appearance: alert, cooperative and mild distress Resp: normal work of breathing Extremities: left 2nd toe with anterior aspect with portion of dry necrosis but posterior aspect now with drainage and feels boggy, separating from the dry portion  Lab Results:  Recent Labs    11/24/18 0006 11/25/18 0518  WBC 8.0 7.0  HGB 13.0 10.7*  HCT 40.3 32.8*  PLT 241 233   BMET Recent Labs    11/24/18 1550 11/25/18 0518  NA 127* 128*  K 4.3 4.5  CL 96* 100  CO2 20* 20*  GLUCOSE 90 100*  BUN 16 23  CREATININE 0.63 0.77  CALCIUM 7.9* 8.0*    Studies/Results: [place] Arterial Abi (screening Lower Extremity)  Result Date: 11/24/2018 CLINICAL DATA:  85 year old male with foot ulcers EXAM: NONINVASIVE PHYSIOLOGIC VASCULAR STUDY OF BILATERAL LOWER EXTREMITIES TECHNIQUE: Evaluation of both lower extremities was performed at rest, including calculation of ankle-brachial indices, multiple segmental pressure evaluation, segmental Doppler and segmental pulse volume recording. COMPARISON:  None. FINDINGS: Right: Resting ankle brachial index:  1.56 Segmental blood pressure: Symmetric upper  extremity pressures Doppler: Segmental Doppler at the right ankle demonstrates triphasic waveform of the posterior tibial artery and dorsalis pedis. Left: Resting ankle brachial index: Non attainable secondary to noncompression Segmental blood pressure: Symmetric upper extremity pressures Doppler: Segmental Doppler at the left ankle demonstrates biphasic waveform of the posterior tibial artery and dorsalis pedis. Additional: IMPRESSION: Resting ABI the right lower extremity is elevated, likely secondary to noncompression, with unobtainable ABI on the left. Segmental Doppler at the bilateral lower extremities demonstrates normal waveform on the right and biphasic waveform on the left. Signed, Yvone NeuJaime S. Reyne DumasWagner, DO, RPVI [facility] Electronically Signed   By: Gilmer MorJaime  Wagner D.O.   On: 11/24/2018 08:48   Dg Chest Portable 1 View  Result Date: 11/23/2018 CLINICAL DATA:  85 year old male with history of cellulitis of the left second toes. EXAM: PORTABLE CHEST 1 VIEW COMPARISON:  Chest x-ray 06/03/2017. FINDINGS: Lung volumes are normal. No consolidative airspace disease. No pleural effusions. No pneumothorax. No pulmonary nodule or mass noted. Pulmonary vasculature and the cardiomediastinal silhouette are within normal limits. Atherosclerosis in the thoracic aorta. Left-sided pacemaker device in place with lead tips projecting over the expected location of the right atrium and right ventricle. IMPRESSION: 1.  No radiographic evidence of acute cardiopulmonary disease. 2. Aortic atherosclerosis. Electronically Signed   By: Trudie Reedaniel  Entrikin M.D.   On: 11/23/2018 18:01   Dg Foot Complete Left  Result Date: 11/23/2018 CLINICAL DATA:  Cellulitis of the second toe EXAM: LEFT FOOT - COMPLETE 3+ VIEW COMPARISON:  None. FINDINGS: Markedly severe bony demineralization,  with hyperflexion of the MTP joints of the second through fifth digits leading to bony overlap and  very poor definition of the phalanges. Difficult standard positioning of the foot. No obvious dislocation at the Lisfranc joint. We were not able to obtain a true lateral projection. Degenerative midfoot findings. In the second toe there is severe distal phalangeal bony demineralization probably from osteomyelitis. Poor definition of the middle phalanx. Suspected fracture of the distal metaphysis and head of the proximal phalanx. Other phalanges including the fourth and fifth toe distal phalanges are highly indistinct. IMPRESSION: 1. Severe demineralization of the distal phalanx second toe probably from osteomyelitis. 2. Probable fracture of the distal metaphysis and head of the proximal phalanx second toe. Poor visualization of the distal phalanges of the fourth and fifth toes, significance uncertain. 3. Severe osteoporosis/bony demineralization. 4. Foot deformities make standard projections problematic. We were unable to obtain standard views. Electronically Signed   By: Gaylyn RongWalter  Liebkemann M.D.   On: 11/23/2018 18:04    Anti-infectives: Anti-infectives (From admission, onward)   Start     Dose/Rate Route Frequency Ordered Stop   11/24/18 0930  vancomycin (VANCOCIN) 500 mg in sodium chloride 0.9 % 100 mL IVPB  Status:  Discontinued     500 mg 100 mL/hr over 60 Minutes Intravenous Every 12 hours 11/23/18 2029 11/23/18 2032   11/24/18 0930  vancomycin (VANCOCIN) IVPB 750 mg/150 ml premix  Status:  Discontinued     750 mg 150 mL/hr over 60 Minutes Intravenous Every 12 hours 11/23/18 2032 11/23/18 2037   11/24/18 0930  vancomycin (VANCOCIN) 500 mg in sodium chloride 0.9 % 100 mL IVPB     500 mg 100 mL/hr over 60 Minutes Intravenous Every 12 hours 11/23/18 2037     11/24/18 0800  cefTRIAXone (ROCEPHIN) 2 g in sodium chloride 0.9 % 100 mL IVPB     2 g 200 mL/hr over 30 Minutes Intravenous Every 24 hours 11/24/18 0746     11/23/18 2130  vancomycin (VANCOCIN) 1,250 mg in sodium chloride 0.9 % 250 mL IVPB   Status:  Discontinued     1,250 mg 166.7 mL/hr over 90 Minutes Intravenous  Once 11/23/18 2029 11/23/18 2032   11/23/18 2130  vancomycin (VANCOCIN) 1,250 mg in sodium chloride 0.9 % 250 mL IVPB  Status:  Discontinued     1,250 mg 166.7 mL/hr over 90 Minutes Intravenous  Once 11/23/18 2032 11/23/18 2037   11/23/18 2130  vancomycin (VANCOCIN) 1,250 mg in sodium chloride 0.9 % 250 mL IVPB     1,250 mg 166.7 mL/hr over 90 Minutes Intravenous  Once 11/23/18 2037 11/23/18 2316   11/23/18 2030  ceFEPIme (MAXIPIME) 2 g in sodium chloride 0.9 % 100 mL IVPB  Status:  Discontinued     2 g 200 mL/hr over 30 Minutes Intravenous Every 8 hours 11/23/18 2027 11/24/18 0746   11/23/18 1830  ceFAZolin (ANCEF) IVPB 1 g/50 mL premix     1 g 100 mL/hr over 30 Minutes Intravenous  Once 11/23/18 1815 11/23/18 1924   11/23/18 1830  clindamycin (CLEOCIN) IVPB 600 mg     600 mg 100 mL/hr over 30 Minutes Intravenous  Once 11/23/18 1815 11/23/18 2002      Assessment/Plan: [name] is a 85 yo nonambulatory patient who comes in with 2nd toe wound that has been cared for as an outpatient and appeared drier yesterday. Now the toe has some drainage and the posterior fat pad of the toe is boggy and separating off  the dry portion of the wound. At this time it should be qualified as wet gangrene.  ABI yesterday were inconclusive due to non-compressible vessels.  Given these findings he will need a 2nd toe amputation and will potentially have trouble healing the wound. I will plan to leave the wound open for packing.   -Patient is DNR and understands the need for amputation. He does not want me to call his family. He says he will discuss the surgery with them.   -Ultimately he will need assessment of his vascular status if he wants to pursue anything further or has wound healing issues, either a CTA here verses outpatient Vascular follow up so they discuss options, the patient may ultimately end up with an AKA due to his  nonambulatory status/ bed bound status and risk of pressure ulcer creation on stumps  -Discussed the risk of bleeding, infection, need for further amputation or procedures with the patient who has agreed to proceed.  -EKG pending, ECHO in 2018 with EF 60-65%, pacemaker in place for Tachy-brady syndrome  -CXR without acute findings   Updated Dr. Dyann Kief. May need to do amputation with local block given comorbidities. Will discuss with anesthesia.     LOS: 1 day    Virl Cagey 11/25/2018

## 2018-11-25 NOTE — Progress Notes (Signed)
PROGRESS NOTE  Jon CutterWilliam P Sammons PIR:[id number]:[id number] DOB: [birth year] DOA: 11/23/2018 PCP: Gareth MorganKnowlton, Steve, MD  Brief History:  85 y/o male with a history of paroxysmal atrial fibrillation, tachybradycardia syndrome status post permanent pacemaker 2006, diastolic CHF, hypertension, hyponatremia, sacral decubitus ulcer, chronic indwelling Foley catheter, and dementia presenting with bleeding and discoloration of his left second toe.  The patient was recently admitted to the hospital from 07/28/2018 through 08/02/2018 at which time the patient was treated for a penoscrotal abscess.  The patient finished antibiotics during the hospitalization and was discharged in stable condition.  He was previously in residential hospice in December 2018.  However, the patient was stabilized and was discharged home.  He has been at home since his period of time.  His son states that he has had a wound in his left second toe for at least a month.  They have been treating it locally at home with wound dressings.  However, in the past 2 to 3 days they noted increasing discoloration and bleeding.  As result, the patient was brought to emergency department for further evaluation.  In the ED, the patient was afebrile with soft blood pressures.  He had oxygen saturation 99% on room air.  WBC was 8.2.  BMP showed a serum sodium 121.  LFTs were unremarkable.  X-ray of his left foot showed severe demineralization of the distal phalanx of the left second toe with probable osteomyelitis.    Assessment/Plan: Dry gangrene left second toe/contiguous focus osteomyelitis -Certainly, the patient may have a degree of surrounding cellulitis -Primary issue is likely a peripheral vascular issue. -appreciate General surgery consultation -Patient is a high risk for surgical intervention, given his comorbidities and likely underlying PVD. -Continue empiric vancomycin and ceftriaxone for cranky nose secondary to with superimposed  cellulitis. -Planning for second left toe amputation on 11/26/2018.  Hyponatremia -Patient appears volume depleted -Due to volume depletion, continue use of diuretics and poor solute intake -Continue IV fluids, encourage adequate oral intake. -Continue holding furosemide  Catheter associated UTI -Patient has chronic indwelling Foley catheter secondary to neurogenic bladder -Foley catheter changed on 11/23/2018 -Patient denies dysuria -??  Colonization component. -Continue empiric ceftriaxone pending final urine culture data  Tachybradycardia syndrome -Status post permanent pacemaker -Presently in sinus rhythm  Paroxysmal atrial fibrillation -CHADS-VASc at least 4 (age x2, CHF, HTN) -Hold ASA pending surgeon's intervention; planning second left toe amputation on 11/26/2018. -Not on anticoagulation secondary to prior bleeding history -Continue Holding metoprolol secondary to soft blood pressure.   Chronic diastolic CHF -Appears clinically dry -Continue holding furosemide -Will also continue holding metoprolol, in the settings of soft blood pressure.  Severe protein calorie malnutrition  -Nutrition consultation in place -Will follow recommendation for feeding supplements.  Anxiety -continue home dose alprazolam -Mood appropriate.     Disposition Plan:   Home in 1-2 days  Family Communication:   Son updated on phone 6/9  Consultants:  General surgery  Code Status:   DNR  DVT Prophylaxis:  [place] Heparin   Procedures: As Listed in Progress Note Above  Antibiotics: vanco 6/8>>> Ceftriaxone 6/9>>>   Subjective: Afebrile, no abdominal pain, no nausea, no vomiting, reports no dysuria.  Patient also denies chest pain or shortness of breath.   Objective: Vitals:   11/25/18 0735 11/25/18 0912 11/25/18 1200 11/25/18 1500  BP: (!) 79/47 (!) 75/60 (!) 80/57 (!) 83/62  Pulse: 81  74 80  Resp: 16  16   Temp:  98.7 F (37.1 C)  97.9 F (36.6 C) 98 F (36.7 C)  TempSrc:  Oral  Oral Oral  SpO2: 100%  99% 97%  Weight:      Height:        Intake/Output Summary (Last 24 hours) at 11/25/2018 1805 Last data filed at 11/25/2018 1800 Gross per 24 hour  Intake 2484.73 ml  Output 1200 ml  Net 1284.73 ml   Weight change: -2.236 kg Exam:  General exam: Alert, awake, in no acute distress and currently afebrile.  Following commands appropriately.  No nausea or vomiting. Respiratory system: Clear to auscultation. Respiratory effort normal.  No using accessory muscles.  Good O2 sat on room air. Cardiovascular system:RRR. No murmurs, rubs, gallops. Gastrointestinal system: Abdomen is nondistended, soft and nontender. No organomegaly or masses felt. Normal bowel sounds heard. Central nervous system: Alert and oriented. No focal deficits. Extremities/skin: No edema, left foot as per pictures below, demonstrating second great toe with wet/dry gangrene changes, mild superimposed erythematous changes involving dorsal aspect of his foot.  Right lower extremity without open wounds.  No petechiae.  Stage II decubitus ulcer in his sacral/coccyx area present prior to admission.  No sign of superimposed infection in this area. Psychiatry:  Mood & affect appropriate.   Left foot       Data Reviewed: I have personally reviewed following labs and imaging studies  Basic Metabolic Panel: Recent Labs  Lab 11/23/18 1650 11/24/18 0006 11/24/18 0836 11/24/18 1550 11/25/18 0518  NA 121* 123* 126* 127* 128*  K 4.7 4.5 4.4 4.3 4.5  CL 86* 91* 94* 96* 100  CO2 24 19* 22 20* 20*  GLUCOSE 104* 103* 93 90 100*  BUN 20 18 17 16 23   CREATININE 0.68 0.63 0.66 0.63 0.77  CALCIUM 8.6* 8.5* 8.2* 7.9* 8.0*   Liver Function Tests: Recent Labs  Lab 11/23/18 1650  AST 17  ALT 10  ALKPHOS 80  BILITOT 0.9  PROT 6.9  ALBUMIN 3.3*   CBC: Recent Labs  Lab 11/23/18 1650 11/24/18 0006 11/25/18 0518  WBC 8.2 8.0 7.0  NEUTROABS 6.2 6.9  --   HGB 12.6* 13.0 10.7*  HCT 38.6*  40.3 32.8*  MCV 80.4 82.4 82.2  PLT 255 241 233   CBG: Recent Labs  Lab 11/25/18 0720  GLUCAP 98   Urine analysis:    Component Value Date/Time   COLORURINE YELLOW 11/23/2018 1732   APPEARANCEUR CLOUDY (A) 11/23/2018 1732   LABSPEC 1.008 11/23/2018 1732   PHURINE 8.0 11/23/2018 1732   GLUCOSEU NEGATIVE 11/23/2018 1732   HGBUR SMALL (A) 11/23/2018 1732   BILIRUBINUR NEGATIVE 11/23/2018 1732   KETONESUR NEGATIVE 11/23/2018 1732   PROTEINUR NEGATIVE 11/23/2018 1732   UROBILINOGEN 0.2 03/14/2015 1515   NITRITE POSITIVE (A) 11/23/2018 1732   LEUKOCYTESUR LARGE (A) 11/23/2018 1732    Recent Results (from the past 240 hour(s))  Culture, blood (routine x 2)     Status: Abnormal   Collection Time: 11/23/18  4:50 PM  Result Value Ref Range Status   Specimen Description   Final    BLOOD RIGHT ARM Performed at [facility], [address] [place], [place] [address]    Special Requests   Final    BOTTLES DRAWN AEROBIC AND ANAEROBIC Blood Culture adequate volume Performed at [facility], [address], [place], [place] [address]    Culture  Setup Time   Final    GRAM POSITIVE COCCI AEROBIC BOTTLE ONLY Gram Stain Report Called to,Read Back  By and Verified With: JONES J. AT 1405 BY THOMPSON S. CRITICAL RESULT CALLED TO, READ BACK BY AND VERIFIED WITH: T HANDY RN 1937 11/24/18 A BROWNING    Culture (A)  Final    STAPHYLOCOCCUS SPECIES (COAGULASE NEGATIVE) THE SIGNIFICANCE OF ISOLATING THIS ORGANISM FROM A SINGLE SET OF BLOOD CULTURES WHEN MULTIPLE SETS ARE DRAWN IS UNCERTAIN. PLEASE NOTIFY THE MICROBIOLOGY DEPARTMENT WITHIN ONE WEEK IF SPECIATION AND SENSITIVITIES ARE REQUIRED. Performed at [facility], [address] [place], [place] [address]    Report Status 11/25/2018 FINAL  Final  Blood Culture ID Panel (Reflexed)     Status: Abnormal   Collection Time: 11/23/18  4:50 PM  Result Value Ref Range Status   Enterococcus species NOT DETECTED NOT DETECTED Final   Listeria  monocytogenes NOT DETECTED NOT DETECTED Final   Staphylococcus species DETECTED (A) NOT DETECTED Final    Comment: Methicillin (oxacillin) resistant coagulase negative staphylococcus. Possible blood culture contaminant (unless isolated from more than one blood culture draw or clinical case suggests pathogenicity). No antibiotic treatment is indicated for blood  culture contaminants. CRITICAL RESULT CALLED TO, READ BACK BY AND VERIFIED WITH: T HANDY RN 1937 11/24/18 A BROWNING    Staphylococcus aureus (BCID) NOT DETECTED NOT DETECTED Final   Methicillin resistance DETECTED (A) NOT DETECTED Final    Comment: CRITICAL RESULT CALLED TO, READ BACK BY AND VERIFIED WITH: T HANDY RN 1937 11/24/18 A BROWNING    Streptococcus species NOT DETECTED NOT DETECTED Final   Streptococcus agalactiae NOT DETECTED NOT DETECTED Final   Streptococcus pneumoniae NOT DETECTED NOT DETECTED Final   Streptococcus pyogenes NOT DETECTED NOT DETECTED Final   Acinetobacter baumannii NOT DETECTED NOT DETECTED Final   Enterobacteriaceae species NOT DETECTED NOT DETECTED Final   Enterobacter cloacae complex NOT DETECTED NOT DETECTED Final   Escherichia coli NOT DETECTED NOT DETECTED Final   Klebsiella oxytoca NOT DETECTED NOT DETECTED Final   Klebsiella pneumoniae NOT DETECTED NOT DETECTED Final   Proteus species NOT DETECTED NOT DETECTED Final   Serratia marcescens NOT DETECTED NOT DETECTED Final   Haemophilus influenzae NOT DETECTED NOT DETECTED Final   Neisseria meningitidis NOT DETECTED NOT DETECTED Final   Pseudomonas aeruginosa NOT DETECTED NOT DETECTED Final   Candida albicans NOT DETECTED NOT DETECTED Final   Candida glabrata NOT DETECTED NOT DETECTED Final   Candida krusei NOT DETECTED NOT DETECTED Final   Candida parapsilosis NOT DETECTED NOT DETECTED Final   Candida tropicalis NOT DETECTED NOT DETECTED Final    Comment: Performed at [facility], [address], [place], [place] [address]  Culture, blood  (routine x 2)     Status: None (Preliminary result)   Collection Time: 11/23/18  4:59 PM  Result Value Ref Range Status   Specimen Description BLOOD LEFT WRIST  Final   Special Requests   Final    BOTTLES DRAWN AEROBIC AND ANAEROBIC Blood Culture adequate volume   Culture   Final    NO GROWTH 2 DAYS Performed at [facility], [address], [place], [place] [address]    Report Status PENDING  Incomplete  SARS Coronavirus 2 (CEPHEID - Performed in [facility] hospital lab), Hosp Order     Status: None   Collection Time: 11/23/18  5:27 PM  Result Value Ref Range Status   SARS Coronavirus 2 NEGATIVE NEGATIVE Final    Comment: (NOTE) If result is NEGATIVE SARS-CoV-2 target nucleic acids are NOT DETECTED. The SARS-CoV-2 RNA is generally detectable  in upper and lower  respiratory specimens during the acute phase of infection. The lowest  concentration of SARS-CoV-2 viral copies this assay can detect is 250  copies / mL. A negative result does not preclude SARS-CoV-2 infection  and should not be used as the sole basis for treatment or other  patient management decisions.  A negative result may occur with  improper specimen collection / handling, submission of specimen other  than nasopharyngeal swab, presence of viral mutation(s) within the  areas targeted by this assay, and inadequate number of viral copies  (<250 copies / mL). A negative result must be combined with clinical  observations, patient history, and epidemiological information. If result is POSITIVE SARS-CoV-2 target nucleic acids are DETECTED. The SARS-CoV-2 RNA is generally detectable in upper and lower  respiratory specimens dur ing the acute phase of infection.  Positive  results are indicative of active infection with SARS-CoV-2.  Clinical  correlation with patient history and other diagnostic information is  necessary to determine patient infection status.  Positive results do  not rule out bacterial infection or  co-infection with other viruses. If result is PRESUMPTIVE POSTIVE SARS-CoV-2 nucleic acids MAY BE PRESENT.   A presumptive positive result was obtained on the submitted specimen  and confirmed on repeat testing.  While 2019 novel coronavirus  (SARS-CoV-2) nucleic acids may be present in the submitted sample  additional confirmatory testing may be necessary for epidemiological  and / or clinical management purposes  to differentiate between  SARS-CoV-2 and other Sarbecovirus currently known to infect humans.  If clinically indicated additional testing with an alternate test  methodology [phone]([id number]) is advised. The SARS-CoV-2 RNA is generally  detectable in upper and lower respiratory sp ecimens during the acute  phase of infection. The expected result is Negative. Fact Sheet for Patients:  BoilerBrush.com.cyhttps://www.fda.gov/media/136312/download Fact Sheet for Healthcare Providers: https://pope.com/https://www.fda.gov/media/136313/download This test is not yet approved or cleared by the [place] FDA and has been authorized for detection and/or diagnosis of SARS-CoV-2 by FDA under an Emergency Use Authorization (EUA).  This EUA will remain in effect (meaning this test can be used) for the duration of the COVID-19 declaration under Section 564(b)(1) of the Act, 21 U.S.C. section 360bbb-3(b)(1), unless the authorization is terminated or revoked sooner. Performed at [name], [address] [place], [place] [address]   Urine Culture     Status: Abnormal (Preliminary result)   Collection Time: 11/23/18  5:32 PM  Result Value Ref Range Status   Specimen Description   Final    URINE, CATHETERIZED Performed at [name], [address] [place], [place] [address]    Special Requests   Final    NONE Performed at [name], [address] [place], [place] [address]    Culture (A)  Final    >=100,000 COLONIES/mL PSEUDOMONAS AERUGINOSA CULTURE REINCUBATED FOR BETTER GROWTH Performed at [facility], [address] [place], [place] [address]    Report Status PENDING  Incomplete     Scheduled Meds:  feeding supplement (ENSURE ENLIVE)  237 mL Oral BID BM   feeding supplement (PRO-STAT SUGAR FREE 64)  30 mL Oral BID   metoprolol tartrate  12.5 mg Oral BID   nutrition supplement (JUVEN)  1 packet Oral BID BM   sodium chloride flush  3 mL Intravenous Q12H   Continuous Infusions:  sodium chloride Stopped (11/25/18 1226)   cefTRIAXone (ROCEPHIN)  IV Stopped (11/25/18 0850)   vancomycin Stopped (11/25/18 1043)  Procedures/Studies: [place] Arterial Abi (screening Lower Extremity)  Result Date: 11/24/2018 CLINICAL DATA:  85 year old male with foot ulcers EXAM: NONINVASIVE PHYSIOLOGIC VASCULAR STUDY OF BILATERAL LOWER EXTREMITIES TECHNIQUE: Evaluation of both lower extremities was performed at rest, including calculation of ankle-brachial indices, multiple segmental pressure evaluation, segmental Doppler and segmental pulse volume recording. COMPARISON:  None. FINDINGS: Right: Resting ankle brachial index:  1.56 Segmental blood pressure: Symmetric upper extremity pressures Doppler: Segmental Doppler at the right ankle demonstrates triphasic waveform of the posterior tibial artery and dorsalis pedis. Left: Resting ankle brachial index: Non attainable secondary to noncompression Segmental blood pressure: Symmetric upper extremity pressures Doppler: Segmental Doppler at the left ankle demonstrates biphasic waveform of the posterior tibial artery and dorsalis pedis. Additional: IMPRESSION: Resting ABI the right lower extremity is elevated, likely secondary to noncompression, with unobtainable ABI on the left. Segmental Doppler at the bilateral lower extremities demonstrates normal waveform on the right and biphasic waveform on the left. Signed, Yvone NeuJaime S. Reyne DumasWagner, DO, RPVI [facility] Electronically Signed   By: Gilmer MorJaime  Wagner D.O.   On:  11/24/2018 08:48   Dg Chest Portable 1 View  Result Date: 11/23/2018 CLINICAL DATA:  85 year old male with history of cellulitis of the left second toes. EXAM: PORTABLE CHEST 1 VIEW COMPARISON:  Chest x-ray 06/03/2017. FINDINGS: Lung volumes are normal. No consolidative airspace disease. No pleural effusions. No pneumothorax. No pulmonary nodule or mass noted. Pulmonary vasculature and the cardiomediastinal silhouette are within normal limits. Atherosclerosis in the thoracic aorta. Left-sided pacemaker device in place with lead tips projecting over the expected location of the right atrium and right ventricle. IMPRESSION: 1.  No radiographic evidence of acute cardiopulmonary disease. 2. Aortic atherosclerosis. Electronically Signed   By: Trudie Reedaniel  Entrikin M.D.   On: 11/23/2018 18:01   Dg Foot Complete Left  Result Date: 11/23/2018 CLINICAL DATA:  Cellulitis of the second toe EXAM: LEFT FOOT - COMPLETE 3+ VIEW COMPARISON:  None. FINDINGS: Markedly severe bony demineralization, with hyperflexion of the MTP joints of the second through fifth digits leading to bony overlap and very poor definition of the phalanges. Difficult standard positioning of the foot. No obvious dislocation at the Lisfranc joint. We were not able to obtain a true lateral projection. Degenerative midfoot findings. In the second toe there is severe distal phalangeal bony demineralization probably from osteomyelitis. Poor definition of the middle phalanx. Suspected fracture of the distal metaphysis and head of the proximal phalanx. Other phalanges including the fourth and fifth toe distal phalanges are highly indistinct. IMPRESSION: 1. Severe demineralization of the distal phalanx second toe probably from osteomyelitis. 2. Probable fracture of the distal metaphysis and head of the proximal phalanx second toe. Poor visualization of the distal phalanges of the fourth and fifth toes, significance uncertain. 3. Severe osteoporosis/bony  demineralization. 4. Foot deformities make standard projections problematic. We were unable to obtain standard views. Electronically Signed   By: Gaylyn RongWalter  Liebkemann M.D.   On: 11/23/2018 18:04    Vassie Lollarlos Frances Ambrosino, MD  [facility] Pager [phone]   11/25/2018, 6:05 PM   LOS: 1 day

## 2018-11-26 ENCOUNTER — Inpatient Hospital Stay (HOSPITAL_COMMUNITY): Payer: Medicare Other | Admitting: Anesthesiology

## 2018-11-26 ENCOUNTER — Other Ambulatory Visit: Payer: Self-pay

## 2018-11-26 ENCOUNTER — Encounter (HOSPITAL_COMMUNITY)
Admission: EM | Disposition: A | Discharge status: Home Health | Payer: Self-pay | Source: Home / Self Care | Attending: Internal Medicine

## 2018-11-26 ENCOUNTER — Encounter (HOSPITAL_COMMUNITY): Payer: Self-pay | Admitting: Anesthesiology

## 2018-11-26 HISTORY — PX: AMPUTATION TOE: SHX6595

## 2018-11-26 LAB — SURGICAL PCR SCREEN
MRSA, PCR: NEGATIVE
Staphylococcus aureus: NEGATIVE

## 2018-11-26 LAB — URINE CULTURE: Culture: >=100000 — AB

## 2018-11-26 LAB — GLUCOSE, CAPILLARY
Glucose-Capillary: 94 mg/dL (ref 70–99)
Glucose-Capillary: 94 mg/dL (ref 70–99)

## 2018-11-26 LAB — VANCOMYCIN, TROUGH: Vancomycin Tr: 17 ug/mL (ref 15–20)

## 2018-11-26 LAB — C-REACTIVE PROTEIN: CRP: 8.8 mg/dL — ABNORMAL HIGH (ref <1.0)

## 2018-11-26 LAB — SEDIMENTATION RATE: Sed Rate: 38 mm/hr — ABNORMAL HIGH (ref 0–16)

## 2018-11-26 SURGERY — AMPUTATION, TOE
Anesthesia: Monitor Anesthesia Care | Site: Toe | Laterality: Left

## 2018-11-26 MED ORDER — KETAMINE HCL 50 MG/5ML IJ SOSY
PREFILLED_SYRINGE | INTRAMUSCULAR | Status: AC
  Filled 2018-11-26: qty 5

## 2018-11-26 MED ORDER — LIDOCAINE HCL (PF) 1 % IJ SOLN
INTRAMUSCULAR | Status: AC
  Filled 2018-11-26: qty 2

## 2018-11-26 MED ORDER — CIPROFLOXACIN HCL 250 MG PO TABS
500.0000 mg | ORAL_TABLET | Freq: Two times a day (BID) | ORAL | Status: DC
  Administered 2018-11-26 – 2018-11-27 (×2): 500 mg via ORAL
  Filled 2018-11-26 (×2): qty 2

## 2018-11-26 MED ORDER — BUPIVACAINE HCL (PF) 0.5 % IJ SOLN
INTRAMUSCULAR | Status: DC | PRN
  Administered 2018-11-26: 7.5 mL

## 2018-11-26 MED ORDER — CHLORHEXIDINE GLUCONATE CLOTH 2 % EX PADS
6.0000 | MEDICATED_PAD | Freq: Once | CUTANEOUS | Status: AC
  Administered 2018-11-26: 6 via TOPICAL

## 2018-11-26 MED ORDER — BUPIVACAINE HCL (PF) 0.5 % IJ SOLN
INTRAMUSCULAR | Status: AC
  Filled 2018-11-26: qty 30

## 2018-11-26 MED ORDER — CEFAZOLIN SODIUM-DEXTROSE 2-4 GM/100ML-% IV SOLN
2.0000 g | INTRAVENOUS | Status: AC
  Administered 2018-11-26: 08:00:00 2 g via INTRAVENOUS

## 2018-11-26 MED ORDER — CEFAZOLIN SODIUM-DEXTROSE 2-4 GM/100ML-% IV SOLN
INTRAVENOUS | Status: AC
  Filled 2018-11-26: qty 100

## 2018-11-26 MED ORDER — LACTATED RINGERS IV SOLN
INTRAVENOUS | Status: DC
  Administered 2018-11-26: 08:00:00 via INTRAVENOUS

## 2018-11-26 MED ORDER — LIDOCAINE 2% (20 MG/ML) 5 ML SYRINGE
INTRAMUSCULAR | Status: AC
  Filled 2018-11-26: qty 5

## 2018-11-26 MED ORDER — PROPOFOL 10 MG/ML IV BOLUS
INTRAVENOUS | Status: DC | PRN
  Administered 2018-11-26 (×2): 10 mg via INTRAVENOUS

## 2018-11-26 MED ORDER — KETAMINE HCL 10 MG/ML IJ SOLN
INTRAMUSCULAR | Status: DC | PRN
  Administered 2018-11-26 (×2): 10 mg via INTRAVENOUS

## 2018-11-26 MED ORDER — GLYCOPYRROLATE PF 0.2 MG/ML IJ SOSY
PREFILLED_SYRINGE | INTRAMUSCULAR | Status: AC
  Filled 2018-11-26: qty 1

## 2018-11-26 MED ORDER — DOXYCYCLINE HYCLATE 100 MG PO TABS
100.0000 mg | ORAL_TABLET | Freq: Two times a day (BID) | ORAL | Status: DC
  Administered 2018-11-26 – 2018-11-27 (×2): 100 mg via ORAL
  Filled 2018-11-26 (×2): qty 1

## 2018-11-26 MED ORDER — HEMOSTATIC AGENTS (NO CHARGE) OPTIME
TOPICAL | Status: DC | PRN
  Administered 2018-11-26: 1 via TOPICAL

## 2018-11-26 MED ORDER — LIDOCAINE HCL (PF) 1 % IJ SOLN
INTRAMUSCULAR | Status: AC
  Filled 2018-11-26: qty 30

## 2018-11-26 MED ORDER — SODIUM CHLORIDE 0.9 % IR SOLN
Status: DC | PRN
  Administered 2018-11-26: 1000 mL

## 2018-11-26 MED ORDER — HYDROMORPHONE HCL 1 MG/ML IJ SOLN: 0.2500 mg | INTRAMUSCULAR | Status: DC | PRN

## 2018-11-26 MED ORDER — PROPOFOL 10 MG/ML IV BOLUS
INTRAVENOUS | Status: AC
  Filled 2018-11-26: qty 20

## 2018-11-26 SURGICAL SUPPLY — 38 items
BANDAGE ELASTIC 4 LF NS (GAUZE/BANDAGES/DRESSINGS) ×3 IMPLANT
BANDAGE ELASTIC 4 VELCRO NS (GAUZE/BANDAGES/DRESSINGS) ×3 IMPLANT
BLADE SURG 15 STRL LF DISP TIS (BLADE) ×1 IMPLANT
BLADE SURG 15 STRL SS (BLADE) ×2
BNDG CMPR MED 5X4 ELC HKLP NS (GAUZE/BANDAGES/DRESSINGS) ×1
BNDG GAUZE ELAST 4 BULKY (GAUZE/BANDAGES/DRESSINGS) ×3 IMPLANT
CLOTH BEACON ORANGE TIMEOUT ST (SAFETY) ×3 IMPLANT
COLLAGEN CELLERATERX 1 GRAM (Miscellaneous) ×3 IMPLANT
COVER LIGHT HANDLE STERIS (MISCELLANEOUS) ×6 IMPLANT
COVER WAND RF STERILE (DRAPES) ×3 IMPLANT
DRSG XEROFORM 1X8 (GAUZE/BANDAGES/DRESSINGS) ×6 IMPLANT
ELECT REM PT RETURN 9FT ADLT (ELECTROSURGICAL) ×3
ELECTRODE REM PT RTRN 9FT ADLT (ELECTROSURGICAL) ×1 IMPLANT
GAUZE SPONGE 4X4 12PLY STRL (GAUZE/BANDAGES/DRESSINGS) ×3 IMPLANT
GLOVE BIO SURGEON STRL SZ 6.5 (GLOVE) ×2 IMPLANT
GLOVE BIO SURGEONS STRL SZ 6.5 (GLOVE) ×1
GLOVE BIOGEL PI IND STRL 6.5 (GLOVE) ×1 IMPLANT
GLOVE BIOGEL PI IND STRL 7.0 (GLOVE) ×2 IMPLANT
GLOVE BIOGEL PI INDICATOR 6.5 (GLOVE) ×2
GLOVE BIOGEL PI INDICATOR 7.0 (GLOVE) ×4
GLOVE ECLIPSE 6.5 STRL STRAW (GLOVE) ×3 IMPLANT
GOWN STRL REUS W/TWL LRG LVL3 (GOWN DISPOSABLE) ×6 IMPLANT
INST SET MINOR BONE (KITS) ×3 IMPLANT
KIT TURNOVER KIT A (KITS) ×3 IMPLANT
MANIFOLD NEPTUNE II (INSTRUMENTS) ×3 IMPLANT
NEEDLE HYPO 25X1 1.5 SAFETY (NEEDLE) ×3 IMPLANT
NS IRRIG 1000ML POUR BTL (IV SOLUTION) ×3 IMPLANT
PACK BASIC LIMB (CUSTOM PROCEDURE TRAY) ×3 IMPLANT
PAD ARMBOARD 7.5X6 YLW CONV (MISCELLANEOUS) ×3 IMPLANT
SET BASIN LINEN APH (SET/KITS/TRAYS/PACK) ×3 IMPLANT
SPONGE LAP 18X18 RF (DISPOSABLE) ×3 IMPLANT
SUT ETHILON 3 0 FSL (SUTURE) IMPLANT
SUT PROLENE 2 0 FS (SUTURE) IMPLANT
SUT VIC AB 2-0 CT1 27 (SUTURE) ×2
SUT VIC AB 2-0 CT1 TAPERPNT 27 (SUTURE) ×1 IMPLANT
SUT VIC AB 3-0 SH 27 (SUTURE)
SUT VIC AB 3-0 SH 27XBRD (SUTURE) IMPLANT
SYR CONTROL 10ML LL (SYRINGE) ×3 IMPLANT

## 2018-11-26 NOTE — Anesthesia Postprocedure Evaluation (Signed)
Anesthesia Post Note  Patient: Jon [name]  Procedure(s) Performed: Left Second Toe Amputation (Left Toe)  Patient location during evaluation: PACU Anesthesia Type: MAC Level of consciousness: awake and alert and oriented Pain management: pain level controlled Vital Signs Assessment: post-procedure vital signs reviewed and stable Respiratory status: spontaneous breathing and respiratory function stable Cardiovascular status: stable Postop Assessment: no apparent nausea or vomiting Anesthetic complications: no     Last Vitals:  Vitals:   11/26/18 0722 11/26/18 0855  BP: 105/67 (P) 98/73  Pulse: 84 70  Resp: 17 10  Temp: 36.9 C (P) 36.4 C  SpO2: 96% 96%    Last Pain:  Vitals:   11/26/18 0722  TempSrc: Oral  PainSc: 8                  Emorie Mcfate A

## 2018-11-26 NOTE — Progress Notes (Signed)
[facility]  Posterior portion of toe pulling away from the dry portion and is draining/ wet. Given this will plan for amputation of that toe. Patient understands and has no questions. Understands possibly needing more amputations in the future.  DNR but during OR was agreeable to doing whatever was needed.  Curlene Labrum, MD [facility] [address] [place], [place] [address] [phone] (office)

## 2018-11-26 NOTE — Progress Notes (Signed)
[facility]  Left 2nd toe amputation performed. Called and updated Sherren Mocha, son, discussed reasons for amputation (osteomyelitis and wet gangrene), discussed that patient was bed bound since 2016 and DNR. Discussed that we hope he heals the wound but if there was any concern or need for further amputation he would need to see Vascular and get evaluated.  Given the patient's overall status and function I think there is probably a limit to what Vascular can offer and with being bed bound an AKA would be the safest amputation in the future due to risk of pressure ulcer formation.  At this time we are going to see how this wound heals and move forward with wound care.  Curlene Labrum, MD [facility] [address] [place], [place] [address] [phone] (office)

## 2018-11-26 NOTE — Op Note (Signed)
[facility] Operative Note  11/26/18  Preoperative Diagnosis: Wet gangrene 2nd left toe; osteomyelitis     Postoperative Diagnosis: Same   Procedure(s) Performed: Amputation of the 2nd left toe    Surgeon: Lanell Matar. Constance Haw, MD   Assistants: No qualified resident was available    Anesthesia: Sedation and local    Anesthesiologist: Lenice Llamas, MD    Specimens: 2nd left toe    Estimated Blood Loss: Minimal   Blood Replacement: None    Complications: None   Wound Class: Dirty/ infected    Operative Indications: Jon [name] is a 85 yo who is non ambulatory / bed bound who was found to have a worsening wound on his 2nd toe on the left. He had been getting home health RN evaluations and was sent to the ED.  On my initial inspect the toe had anterior dry gangrene and appeared to be solid and did not have drainage. We had planned for betadine paint to the area.  The next day after some betadine wound care, the posterior aspect of the toe amputated itself from the dry gangrene and was boggy and draining. Given this, the toe had progressed to wet gangrene and needed to be removed.  I discussed with the patient that he was bed bound, and we discussed his limited blood flow based on the ABI and potential need for further surgery, amputation in the future if he does not heal.   Findings: Wet gangrene of the left 2nd toe    Procedure: The patient was taken to the operating room and was left in his hospital bed due to chronic back pain and neck pain.  Anesthesia was able to give him sedation with ketamine.  Intravenous antibiotics were administered per protocol.  The left foot was cleaned and prepped with betadine. The left foot was prepped and draped in the standard fashion.  Lidocaine 1% was used to block the 2nd left digit.  A fish mouth incision was made at the base at the level of the proximal phalanx and metatarsal head. This was carried down through the  subcutaneous tissue and tendinous attachments with cautery.  The 2nd left toe was amputated at the proximal phalanx metatarsal joint space.   The area was irrigated. Hemostasis was achieved. Cellerate Activated Collagen was placed in the wound.  The deep space was closed with 2-0 Vicryl interrupted suture and the skin was left open as it was under too much tension to close.   All counts were correct at the end of the case. The patient was awakened from sedation without complication.  The patient went to the PACU in stable condition.   Curlene Labrum, MD [facility] [address] [place], [place] [address] [phone] (office)

## 2018-11-26 NOTE — Anesthesia Preprocedure Evaluation (Signed)
Anesthesia Evaluation  Patient identified by MRN, date of birth, ID band Patient awake    Reviewed: Allergy & Precautions, NPO status , Patient's Chart, lab work & pertinent test results  Airway Mallampati: II  TM Distance: >3 FB Neck ROM: Full    Dental no notable dental hx. (+) Poor Dentition   Pulmonary shortness of breath and at rest, sleep apnea , former smoker,    Pulmonary exam normal breath sounds clear to auscultation       Cardiovascular Exercise Tolerance: Poor hypertension, Pt. on medications +CHF  Normal cardiovascular exam+ dysrhythmias + pacemaker III Rhythm:Regular Rate:Normal  Has h/o afib Has pacer placed in 2006 for tachy brady BP soft  Awake conversant  C/o more neck and back pain than foot pain Pt is bedridden and non ambulatory for years    Neuro/Psych PSYCHIATRIC DISORDERS Dementia  Neuromuscular disease    GI/Hepatic Neg liver ROS, PUD,   Endo/Other  negative endocrine ROS  Renal/GU Renal InsufficiencyRenal disease  negative genitourinary   Musculoskeletal  (+) Arthritis , Osteoarthritis,    Abdominal   Peds negative pediatric ROS (+)  Hematology negative hematology ROS (+) anemia ,   Anesthesia Other Findings   Reproductive/Obstetrics negative OB ROS                             Anesthesia Physical Anesthesia Plan  ASA: IV  Anesthesia Plan: MAC   Post-op Pain Management:    Induction: Intravenous  PONV Risk Score and Plan: 1 and Treatment may vary due to age or medical condition  Airway Management Planned: Nasal Cannula and Simple Face Mask  Additional Equipment:   Intra-op Plan:   Post-operative Plan:   Informed Consent: I have reviewed the patients History and Physical, chart, labs and discussed the procedure including the risks, benefits and alternatives for the proposed anesthesia with the patient or authorized representative who has indicated  his/her understanding and acceptance.   Patient has DNR.  Suspend DNR.   Dental advisory given  Plan Discussed with: CRNA  Anesthesia Plan Comments: (Plan Full PPE use  Plan local/MAC with GA/GETA back up as needed  D/w pt will suspend DNR in periop period -voiced understanding and WTPwith same )        Anesthesia Quick Evaluation

## 2018-11-26 NOTE — Progress Notes (Signed)
Pharmacy Antibiotic Note  Jon [name] is a 85 y.o. male admitted on 11/23/2018 with UTI and osteomyelitis.  Pharmacy has been consulted for Cipro.  Plan: Cipro 500 mg PO twice daily. Doxycycline 100 mg PO twice daily F/U cxs and clinical progress   Height: 5\' 11"  (180.3 cm) Weight: 142 lb 3.2 oz (64.5 kg) IBW/kg (Calculated) : 75.3  Temp (24hrs), Avg:98 F (36.7 C), Min:97.6 F (36.4 C), Max:98.5 F (36.9 C)  Recent Labs  Lab 11/23/18 1650 11/23/18 1830 11/24/18 0006 11/24/18 0836 11/24/18 1550 11/25/18 0518 11/26/18 0950  WBC 8.2  --  8.0  --   --  7.0  --   CREATININE 0.68  --  0.63 0.66 0.63 0.77  --   LATICACIDVEN 1.2 1.6  --   --   --   --   --   VANCOTROUGH  --   --   --   --   --   --  17    Estimated Creatinine Clearance: 62.7 mL/min (by C-G formula based on SCr of 0.77 mg/dL).    Allergies  Allergen Reactions  . Iohexol      Desc: PT STATES THROAT/FACE SWELLING W/ IVP DYE IN 1990. PT HAS NOT HAD ANY EXAMS DONE W/DYE SINCE AND HAS NEVER BEEN PRE MEDICATED. PER PT, Onset Date: [id number]   . Shellfish Allergy   . Dilantin [Phenytoin Sodium Extended] Rash  . Tegretol [Carbamazepine] Rash    Antimicrobials this admission: Vancomycin 6/8 >> 6/11 Cefepime 6/8  >> 6/9 Rocephin 6/9 >> 6/11 Doxy 6/11 >> Cipro 6/11  Dose adjustments this admission: n/a  Microbiology results: 6/8 BCx: 1/2 coag neg staph- probable contaminant  6/8 UCx: pseudomonas   Thank you for allowing pharmacy to be a part of this patient's care.  Margot Ables, PharmD Clinical Pharmacist 11/26/2018 4:15 PM

## 2018-11-26 NOTE — Progress Notes (Signed)
PROGRESS NOTE  Jon [name] QMV:[id number] DOB: July 29, 1931 DOA: 11/23/2018 PCP: Gareth MorganKnowlton, Steve, MD  Brief History:  85 y/o male with a history of paroxysmal atrial fibrillation, tachybradycardia syndrome status post permanent pacemaker 2006, diastolic CHF, hypertension, hyponatremia, sacral decubitus ulcer, chronic indwelling Foley catheter, and dementia presenting with bleeding and discoloration of his left second toe.  The patient was recently admitted to the hospital from 07/28/2018 through 08/02/2018 at which time the patient was treated for a penoscrotal abscess.  The patient finished antibiotics during the hospitalization and was discharged in stable condition.  He was previously in residential hospice in December 2018.  However, the patient was stabilized and was discharged home.  He has been at home since his period of time.  His son states that he has had a wound in his left second toe for at least a month.  They have been treating it locally at home with wound dressings.  However, in the past 2 to 3 days they noted increasing discoloration and bleeding.  As result, the patient was brought to emergency department for further evaluation.  In the ED, the patient was afebrile with soft blood pressures.  He had oxygen saturation 99% on room air.  WBC was 8.2.  BMP showed a serum sodium 121.  LFTs were unremarkable.  X-ray of his left foot showed severe demineralization of the distal phalanx of the left second toe with probable osteomyelitis.    Assessment/Plan: Dry gangrene left second toe/contiguous focus osteomyelitis -Certainly, the patient may have a degree of surrounding cellulitis -Primary issue is likely a peripheral vascular issue. -appreciate General surgery consultation -Patient is a high risk for surgical intervention, given his comorbidities and likely underlying PVD. -Status post second left toe amputation on 11/26/2018. -Antibiotics will be narrowed to doxycycline  orally -Continue wound care  Hyponatremia -Patient appears volume depleted -Due to volume depletion, continue use of diuretics and poor solute intake -Continue IV fluids, encourage adequate oral intake. -Continue holding furosemide  Catheter associated UTI -Patient has chronic indwelling Foley catheter secondary to neurogenic bladder -Foley catheter changed on 11/23/2018 -Patient denies dysuria currently -Urine culture demonstrated more than 100,000 colonies of Pseudomonas. -Rocephin has been discontinued and patient started on ciprofloxacin based on sensitivity.  Tachybradycardia syndrome -Status post permanent pacemaker -Presently in sinus rhythm  Paroxysmal atrial fibrillation -CHADS-VASc at least 4 (age x2, CHF, HTN) -On aspirin at discharge. -Not on anticoagulation secondary to prior bleeding history -Continue Holding metoprolol secondary to soft blood pressure.   Chronic diastolic CHF -Appears clinically dry -Continue holding furosemide -Will also continue holding metoprolol, in the settings of soft blood pressure.  Severe protein calorie malnutrition  -Nutrition consultation in place -Will follow recommendation for feeding supplements.  Anxiety -continue home dose alprazolam -Mood appropriate.     Disposition Plan:   Home in 1-2 days  Family Communication:   Son updated on phone 6/9  Consultants:  General surgery  Code Status:   DNR  DVT Prophylaxis:  [place] Heparin   Procedures: -See below for x-ray reports -Second left toe amputation 11/26/2018  Antibiotics: vanco 6/8>>> 11/26/2018 Ceftriaxone 6/9>>> 11/26/2018 Ciprofloxacin 11/26/2018>>> Doxycycline 11/26/2018>>>   Subjective: In no acute distress.  Afebrile, no chest pain, no nausea, no vomiting, no shortness of breath.  So far demonstrating good tolerance to left second toe amputation done earlier today.  Objective: Vitals:   11/26/18 0722 11/26/18 0855 11/26/18 0900 11/26/18 0915  BP: 105/67  98/73 97/67  100/62  Pulse: 84 70 72 77  Resp: Temp: 98.5 F (36.9 C) 97.6 F (36.4 C)    TempSrc: Oral     SpO2: 96% 96% 95% 95%  Weight:      Height:        Intake/Output Summary (Last 24 hours) at 11/26/2018 1604 Last data filed at 11/26/2018 1600 Gross per 24 hour  Intake 1150 ml  Output 1505 ml  Net -355 ml   Weight change: 5.5 kg  Exam: General exam: Alert, awake, oriented x 2 ([facility]); in no acute distress and afebrile.  Patient is status post left second toe amputation, so far well-tolerated.  No acute distress. Respiratory system: Clear to auscultation. Respiratory effort normal.   Cardiovascular system:RRR. No murmurs, rubs, gallops. Gastrointestinal system: Abdomen is nondistended, soft and nontender. No organomegaly or masses felt. Normal bowel sounds heard. Central nervous system: Alert and oriented. No focal neurological deficits. Extremities/skin: No edema, left fluid with clean dressings in place after second toe amputation earlier today.  Right lower extremity without open wounds.  No rash, no petechiae.  Stage II decubitus ulcer in his sacral/coccyx area present prior to admission; without any signs of acute superimposed infection. Psychiatry: Mood & affect appropriate.    Data Reviewed: I have personally reviewed following labs and imaging studies  Basic Metabolic Panel: Recent Labs  Lab 11/23/18 1650 11/24/18 0006 11/24/18 0836 11/24/18 1550 11/25/18 0518  NA 121* 123* 126* 127* 128*  K 4.7 4.5 4.4 4.3 4.5  CL 86* 91* 94* 96* 100  CO2 24 19* 22 20* 20*  GLUCOSE 104* 103* 93 90 100*  BUN CREATININE 0.68 0.63 0.66 0.63 0.77  CALCIUM 8.6* 8.5* 8.2* 7.9* 8.0*   Liver Function Tests: Recent Labs  Lab 11/23/18 1650  AST 17  ALT 10  ALKPHOS 80  BILITOT 0.9  PROT 6.9  ALBUMIN 3.3*   CBC: Recent Labs  Lab 11/23/18 1650 11/24/18 0006 11/25/18 0518  WBC 8.2 8.0 7.0  NEUTROABS 6.2 6.9  --   HGB 12.6* 13.0 10.7*    HCT 38.6* 40.3 32.8*  MCV 80.4 82.4 82.2  PLT 255 241 233   CBG: Recent Labs  Lab 11/25/18 0720 11/26/18 0909 11/26/18 1117  GLUCAP 98 94 94   Urine analysis:    Component Value Date/Time   COLORURINE YELLOW 11/23/2018 1732   APPEARANCEUR CLOUDY (A) 11/23/2018 1732   LABSPEC 1.008 11/23/2018 1732   PHURINE 8.0 11/23/2018 1732   GLUCOSEU NEGATIVE 11/23/2018 1732   HGBUR SMALL (A) 11/23/2018 1732   BILIRUBINUR NEGATIVE 11/23/2018 1732   KETONESUR NEGATIVE 11/23/2018 1732   PROTEINUR NEGATIVE 11/23/2018 1732   UROBILINOGEN 0.2 03/14/2015 1515   NITRITE POSITIVE (A) 11/23/2018 1732   LEUKOCYTESUR LARGE (A) 11/23/2018 1732    Recent Results (from the past 240 hour(s))  Culture, blood (routine x 2)     Status: Abnormal   Collection Time: 11/23/18  4:50 PM   Specimen: BLOOD RIGHT ARM  Result Value Ref Range Status   Specimen Description   Final    BLOOD RIGHT ARM Performed at [facility], [address] [place], [place] [address]    Special Requests   Final    BOTTLES DRAWN AEROBIC AND ANAEROBIC Blood Culture adequate volume Performed at [facility], [address], [place], [place] [address]    Culture  Setup Time   Final    GRAM POSITIVE COCCI AEROBIC BOTTLE ONLY  Gram Stain Report Called to,Read Back By and Verified With: JONES J. AT 1405 BY THOMPSON S. CRITICAL RESULT CALLED TO, READ BACK BY AND VERIFIED WITH: T HANDY RN 1937 11/24/18 A BROWNING    Culture (A)  Final    STAPHYLOCOCCUS SPECIES (COAGULASE NEGATIVE) THE SIGNIFICANCE OF ISOLATING THIS ORGANISM FROM A SINGLE SET OF BLOOD CULTURES WHEN MULTIPLE SETS ARE DRAWN IS UNCERTAIN. PLEASE NOTIFY THE MICROBIOLOGY DEPARTMENT WITHIN ONE WEEK IF SPECIATION AND SENSITIVITIES ARE REQUIRED. Performed at [facility], [address] [place], [place] [address]    Report Status 11/25/2018 FINAL  Final  Blood Culture ID Panel (Reflexed)     Status: Abnormal   Collection Time: 11/23/18  4:50 PM  Result Value Ref  Range Status   Enterococcus species NOT DETECTED NOT DETECTED Final   Listeria monocytogenes NOT DETECTED NOT DETECTED Final   Staphylococcus species DETECTED (A) NOT DETECTED Final    Comment: Methicillin (oxacillin) resistant coagulase negative staphylococcus. Possible blood culture contaminant (unless isolated from more than one blood culture draw or clinical case suggests pathogenicity). No antibiotic treatment is indicated for blood  culture contaminants. CRITICAL RESULT CALLED TO, READ BACK BY AND VERIFIED WITH: T HANDY RN 1937 11/24/18 A BROWNING    Staphylococcus aureus (BCID) NOT DETECTED NOT DETECTED Final   Methicillin resistance DETECTED (A) NOT DETECTED Final    Comment: CRITICAL RESULT CALLED TO, READ BACK BY AND VERIFIED WITH: T HANDY RN 1937 11/24/18 A BROWNING    Streptococcus species NOT DETECTED NOT DETECTED Final   Streptococcus agalactiae NOT DETECTED NOT DETECTED Final   Streptococcus pneumoniae NOT DETECTED NOT DETECTED Final   Streptococcus pyogenes NOT DETECTED NOT DETECTED Final   Acinetobacter baumannii NOT DETECTED NOT DETECTED Final   Enterobacteriaceae species NOT DETECTED NOT DETECTED Final   Enterobacter cloacae complex NOT DETECTED NOT DETECTED Final   Escherichia coli NOT DETECTED NOT DETECTED Final   Klebsiella oxytoca NOT DETECTED NOT DETECTED Final   Klebsiella pneumoniae NOT DETECTED NOT DETECTED Final   Proteus species NOT DETECTED NOT DETECTED Final   Serratia marcescens NOT DETECTED NOT DETECTED Final   Haemophilus influenzae NOT DETECTED NOT DETECTED Final   Neisseria meningitidis NOT DETECTED NOT DETECTED Final   Pseudomonas aeruginosa NOT DETECTED NOT DETECTED Final   Candida albicans NOT DETECTED NOT DETECTED Final   Candida glabrata NOT DETECTED NOT DETECTED Final   Candida krusei NOT DETECTED NOT DETECTED Final   Candida parapsilosis NOT DETECTED NOT DETECTED Final   Candida tropicalis NOT DETECTED NOT DETECTED Final    Comment: Performed  at [facility], [address] [place], [place] [address]  Culture, blood (routine x 2)     Status: None (Preliminary result)   Collection Time: 11/23/18  4:59 PM   Specimen: BLOOD LEFT WRIST  Result Value Ref Range Status   Specimen Description BLOOD LEFT WRIST  Final   Special Requests   Final    BOTTLES DRAWN AEROBIC AND ANAEROBIC Blood Culture adequate volume   Culture   Final    NO GROWTH 3 DAYS Performed at [name], [address] [place], [place] [address]    Report Status PENDING  Incomplete  SARS Coronavirus 2 (CEPHEID - Performed in [facility] hospital lab), Hosp Order     Status: None   Collection Time: 11/23/18  5:27 PM   Specimen: Nasopharyngeal Swab  Result Value Ref Range Status   SARS Coronavirus 2 NEGATIVE NEGATIVE Final    Comment: (NOTE)  If result is NEGATIVE SARS-CoV-2 target nucleic acids are NOT DETECTED. The SARS-CoV-2 RNA is generally detectable in upper and lower  respiratory specimens during the acute phase of infection. The lowest  concentration of SARS-CoV-2 viral copies this assay can detect is 250  copies / mL. A negative result does not preclude SARS-CoV-2 infection  and should not be used as the sole basis for treatment or other  patient management decisions.  A negative result may occur with  improper specimen collection / handling, submission of specimen other  than nasopharyngeal swab, presence of viral mutation(s) within the  areas targeted by this assay, and inadequate number of viral copies  (<250 copies / mL). A negative result must be combined with clinical  observations, patient history, and epidemiological information. If result is POSITIVE SARS-CoV-2 target nucleic acids are DETECTED. The SARS-CoV-2 RNA is generally detectable in upper and lower  respiratory specimens dur ing the acute phase of infection.  Positive  results are indicative of active infection with SARS-CoV-2.  Clinical  correlation with patient history and  other diagnostic information is  necessary to determine patient infection status.  Positive results do  not rule out bacterial infection or co-infection with other viruses. If result is PRESUMPTIVE POSTIVE SARS-CoV-2 nucleic acids MAY BE PRESENT.   A presumptive positive result was obtained on the submitted specimen  and confirmed on repeat testing.  While 2019 novel coronavirus  (SARS-CoV-2) nucleic acids may be present in the submitted sample  additional confirmatory testing may be necessary for epidemiological  and / or clinical management purposes  to differentiate between  SARS-CoV-2 and other Sarbecovirus currently known to infect humans.  If clinically indicated additional testing with an alternate test  methodology [phone]) is advised. The SARS-CoV-2 RNA is generally  detectable in upper and lower respiratory sp ecimens during the acute  phase of infection. The expected result is Negative. Fact Sheet for Patients:  StrictlyIdeas.no Fact Sheet for Healthcare Providers: BankingDealers.co.za This test is not yet approved or cleared by the [place] FDA and has been authorized for detection and/or diagnosis of SARS-CoV-2 by FDA under an Emergency Use Authorization (EUA).  This EUA will remain in effect (meaning this test can be used) for the duration of the COVID-19 declaration under Section 564(b)(1) of the Act, 21 U.S.C. section 360bbb-3(b)(1), unless the authorization is terminated or revoked sooner. Performed at [facility], [address] [place], [place] [address]   Urine Culture     Status: Abnormal   Collection Time: 11/23/18  5:32 PM   Specimen: Urine, Catheterized  Result Value Ref Range Status   Specimen Description   Final    URINE, CATHETERIZED Performed at [facility], [address] [place], [place] [address]    Special Requests   Final    NONE Performed at [facility], [address], [place], [place]  [address]    Culture >=100,000 COLONIES/mL PSEUDOMONAS AERUGINOSA (A)  Final   Report Status 11/26/2018 FINAL  Final   Organism ID, Bacteria PSEUDOMONAS AERUGINOSA (A)  Final      Susceptibility   Pseudomonas aeruginosa - MIC*    CEFTAZIDIME 4 SENSITIVE Sensitive     CIPROFLOXACIN <=0.25 SENSITIVE Sensitive     GENTAMICIN <=1 SENSITIVE Sensitive     IMIPENEM <=0.25 SENSITIVE Sensitive     PIP/TAZO 8 SENSITIVE Sensitive     CEFEPIME 2 SENSITIVE Sensitive     * >=100,000 COLONIES/mL PSEUDOMONAS AERUGINOSA  Surgical pcr screen     Status: None  Collection Time: 11/25/18 10:21 PM   Specimen: Nasal Mucosa; Nasal Swab  Result Value Ref Range Status   MRSA, PCR NEGATIVE NEGATIVE Final   Staphylococcus aureus NEGATIVE NEGATIVE Final    Comment: (NOTE) The Xpert SA Assay (FDA approved for NASAL specimens in patients 622 years of age and older), is one component of a comprehensive surveillance program. It is not intended to diagnose infection nor to guide or monitor treatment. Performed at [name], [address] [place], [place] [address]      Scheduled Meds:  feeding supplement (ENSURE ENLIVE)  237 mL Oral BID BM   feeding supplement (PRO-STAT SUGAR FREE 64)  30 mL Oral BID   metoprolol tartrate  12.5 mg Oral BID   nutrition supplement (JUVEN)  1 packet Oral BID BM   sodium chloride flush  3 mL Intravenous Q12H   Continuous Infusions:  sodium chloride Stopped (11/25/18 1226)   cefTRIAXone (ROCEPHIN)  IV Stopped (11/25/18 0850)   vancomycin Stopped (11/26/18 1411)    Procedures/Studies: [place] Arterial Abi (screening Lower Extremity)  Result Date: 11/24/2018 CLINICAL DATA:  85 year old male with foot ulcers EXAM: NONINVASIVE PHYSIOLOGIC VASCULAR STUDY OF BILATERAL LOWER EXTREMITIES TECHNIQUE: Evaluation of both lower extremities was performed at rest, including calculation of ankle-brachial indices, multiple segmental pressure evaluation, segmental Doppler and segmental pulse  volume recording. COMPARISON:  None. FINDINGS: Right: Resting ankle brachial index:  1.56 Segmental blood pressure: Symmetric upper extremity pressures Doppler: Segmental Doppler at the right ankle demonstrates triphasic waveform of the posterior tibial artery and dorsalis pedis. Left: Resting ankle brachial index: Non attainable secondary to noncompression Segmental blood pressure: Symmetric upper extremity pressures Doppler: Segmental Doppler at the left ankle demonstrates biphasic waveform of the posterior tibial artery and dorsalis pedis. Additional: IMPRESSION: Resting ABI the right lower extremity is elevated, likely secondary to noncompression, with unobtainable ABI on the left. Segmental Doppler at the bilateral lower extremities demonstrates normal waveform on the right and biphasic waveform on the left. Signed, Yvone NeuJaime S. Reyne DumasWagner, DO, RPVI [facility] Electronically Signed   By: Gilmer MorJaime  Wagner D.O.   On: 11/24/2018 08:48   Dg Chest Portable 1 View  Result Date: 11/23/2018 CLINICAL DATA:  [age] year old male with history of cellulitis of the left second toes. EXAM: PORTABLE CHEST 1 VIEW COMPARISON:  Chest x-ray 06/03/2017. FINDINGS: Lung volumes are normal. No consolidative airspace disease. No pleural effusions. No pneumothorax. No pulmonary nodule or mass noted. Pulmonary vasculature and the cardiomediastinal silhouette are within normal limits. Atherosclerosis in the thoracic aorta. Left-sided pacemaker device in place with lead tips projecting over the expected location of the right atrium and right ventricle. IMPRESSION: 1.  No radiographic evidence of acute cardiopulmonary disease. 2. Aortic atherosclerosis. Electronically Signed   By: Trudie Reedaniel  Entrikin M.D.   On: 11/23/2018 18:01   Dg Foot Complete Left  Result Date: 11/23/2018 CLINICAL DATA:  Cellulitis of the second toe EXAM: LEFT FOOT - COMPLETE 3+ VIEW COMPARISON:  None. FINDINGS: Markedly  severe bony demineralization, with hyperflexion of the MTP joints of the second through fifth digits leading to bony overlap and very poor definition of the phalanges. Difficult standard positioning of the foot. No obvious dislocation at the Lisfranc joint. We were not able to obtain a true lateral projection. Degenerative midfoot findings. In the second toe there is severe distal phalangeal bony demineralization probably from osteomyelitis. Poor definition of the middle phalanx. Suspected fracture of the distal metaphysis and head of the proximal phalanx. Other  phalanges including the fourth and fifth toe distal phalanges are highly indistinct. IMPRESSION: 1. Severe demineralization of the distal phalanx second toe probably from osteomyelitis. 2. Probable fracture of the distal metaphysis and head of the proximal phalanx second toe. Poor visualization of the distal phalanges of the fourth and fifth toes, significance uncertain. 3. Severe osteoporosis/bony demineralization. 4. Foot deformities make standard projections problematic. We were unable to obtain standard views. Electronically Signed   By: Gaylyn Rong M.D.   On: 11/23/2018 18:04    Vassie Loll, MD  [facility] Pager [phone]   11/26/2018, 4:04 PM   LOS: 2 days

## 2018-11-26 NOTE — Transfer of Care (Signed)
Immediate Anesthesia Transfer of Care Note  Patient: Jon [name]  Procedure(s) Performed: Left Second Toe Amputation (Left Toe)  Patient Location: PACU  Anesthesia Type:MAC  Level of Consciousness: awake, alert , oriented and patient cooperative  Airway & Oxygen Therapy: Patient Spontanous Breathing and Patient connected to nasal cannula oxygen  Post-op Assessment: Report given to RN and Post -op Vital signs reviewed and stable  Post vital signs: Reviewed and stable  Last Vitals:  Vitals Value Taken Time  BP    Temp    Pulse 78 11/26/18 0856  Resp 11 11/26/18 0856  SpO2 95 % 11/26/18 0856  Vitals shown include unvalidated device data.  Last Pain:  Vitals:   11/26/18 0722  TempSrc: Oral  PainSc: 8       Patients Stated Pain Goal: 2 (81/44/81 8563)  Complications: No apparent anesthesia complications

## 2018-11-27 ENCOUNTER — Encounter (HOSPITAL_COMMUNITY): Payer: Self-pay | Admitting: General Surgery

## 2018-11-27 DIAGNOSIS — L97529 Non-pressure chronic ulcer of other part of left foot with unspecified severity: Secondary | ICD-10-CM

## 2018-11-27 DIAGNOSIS — L97519 Non-pressure chronic ulcer of other part of right foot with unspecified severity: Secondary | ICD-10-CM

## 2018-11-27 LAB — GLUCOSE, CAPILLARY: Glucose-Capillary: 89 mg/dL (ref 70–99)

## 2018-11-27 LAB — OSMOLALITY, URINE: Osmolality, Ur: 382 mOsm/kg (ref 300–900)

## 2018-11-27 MED ORDER — PRO-STAT SUGAR FREE PO LIQD: 30.0000 mL | Freq: Two times a day (BID) | ORAL | 1 refills | Status: AC

## 2018-11-27 MED ORDER — DOXYCYCLINE HYCLATE 100 MG PO TABS: 100.0000 mg | ORAL_TABLET | Freq: Two times a day (BID) | ORAL | 0 refills | Status: AC

## 2018-11-27 MED ORDER — ENSURE ENLIVE PO LIQD: 237.0000 mL | Freq: Two times a day (BID) | ORAL | Status: AC

## 2018-11-27 MED ORDER — MORPHINE SULFATE (PF) 2 MG/ML IV SOLN: 2.0000 mg | INTRAVENOUS | Status: DC | PRN

## 2018-11-27 MED ORDER — JUVEN PO PACK: 1.0000 | PACK | Freq: Two times a day (BID) | ORAL | 0 refills | Status: AC

## 2018-11-27 MED ORDER — HYDROCODONE-ACETAMINOPHEN 5-325 MG PO TABS: 1.0000 | ORAL_TABLET | Freq: Four times a day (QID) | ORAL | 0 refills | Status: AC | PRN

## 2018-11-27 MED ORDER — METOPROLOL TARTRATE 25 MG PO TABS: 12.5000 mg | ORAL_TABLET | Freq: Two times a day (BID) | ORAL | 1 refills | Status: AC

## 2018-11-27 MED ORDER — CIPROFLOXACIN HCL 500 MG PO TABS: 500.0000 mg | ORAL_TABLET | Freq: Two times a day (BID) | ORAL | 0 refills | Status: AC

## 2018-11-27 NOTE — Progress Notes (Signed)
[facility] Progress Note  1 Day Post-Op  Subjective: No major issues. Tolerated dressing change.   Objective: Vital signs in last 24 hours: Temp:  [97.7 F (36.5 C)-98.3 F (36.8 C)] 98.3 F (36.8 C) (06/12 0501) Pulse Rate:  [68-78] 68 (06/12 0501) Resp:  [16-17] 16 (06/12 0501) BP: (101-105)/(58-61) 101/61 (06/12 0501) SpO2:  [98 %-100 %] 100 % (06/12 0501) Weight:  [63.6 kg] 63.6 kg (06/12 0500) Last BM Date: 11/26/18  Intake/Output from previous day: 06/11 0701 - 06/12 0700 In: 830 [P.O.:480; I.V.:250; IV Piggyback:100] Out: 2655 [Urine:2650; Blood:5] Intake/Output this shift: Total I/O In: 240 [P.O.:240] Out: -   General appearance: alert, cooperative and no distress Resp: normal work of breathing Extremities: left 2nd toe site hemostatic, no signs of redness, bottom of the left foot lateral with some abrasion  Lab Results:  Recent Labs    11/25/18 0518  WBC 7.0  HGB 10.7*  HCT 32.8*  PLT 233   BMET Recent Labs    11/24/18 1550 11/25/18 0518  NA 127* 128*  K 4.3 4.5  CL 96* 100  CO2 20* 20*  GLUCOSE 90 100*  BUN 16 23  CREATININE 0.63 0.77  CALCIUM 7.9* 8.0*    Anti-infectives: Anti-infectives (From admission, onward)   Start     Dose/Rate Route Frequency Ordered Stop   11/26/18 2200  doxycycline (VIBRA-TABS) tablet 100 mg     100 mg Oral Every 12 hours 11/26/18 1606     11/26/18 1800  ciprofloxacin (CIPRO) tablet 500 mg     500 mg Oral 2 times daily 11/26/18 1612     11/26/18 0730  ceFAZolin (ANCEF) IVPB 2g/100 mL premix     2 g 200 mL/hr over 30 Minutes Intravenous On call to O.R. 11/26/18 0723 11/26/18 0805   11/24/18 0930  vancomycin (VANCOCIN) 500 mg in sodium chloride 0.9 % 100 mL IVPB  Status:  Discontinued     500 mg 100 mL/hr over 60 Minutes Intravenous Every 12 hours 11/23/18 2029 11/23/18 2032   11/24/18 0930  vancomycin (VANCOCIN) IVPB 750 mg/150 ml premix  Status:  Discontinued     750 mg 150 mL/hr over 60  Minutes Intravenous Every 12 hours 11/23/18 2032 11/23/18 2037   11/24/18 0930  vancomycin (VANCOCIN) 500 mg in sodium chloride 0.9 % 100 mL IVPB  Status:  Discontinued     500 mg 100 mL/hr over 60 Minutes Intravenous Every 12 hours 11/23/18 2037 11/26/18 1606   11/24/18 0800  cefTRIAXone (ROCEPHIN) 2 g in sodium chloride 0.9 % 100 mL IVPB  Status:  Discontinued     2 g 200 mL/hr over 30 Minutes Intravenous Every 24 hours 11/24/18 0746 11/26/18 1606   11/23/18 2130  vancomycin (VANCOCIN) 1,250 mg in sodium chloride 0.9 % 250 mL IVPB  Status:  Discontinued     1,250 mg 166.7 mL/hr over 90 Minutes Intravenous  Once 11/23/18 2029 11/23/18 2032   11/23/18 2130  vancomycin (VANCOCIN) 1,250 mg in sodium chloride 0.9 % 250 mL IVPB  Status:  Discontinued     1,250 mg 166.7 mL/hr over 90 Minutes Intravenous  Once 11/23/18 2032 11/23/18 2037   11/23/18 2130  vancomycin (VANCOCIN) 1,250 mg in sodium chloride 0.9 % 250 mL IVPB     1,250 mg 166.7 mL/hr over 90 Minutes Intravenous  Once 11/23/18 2037 11/23/18 2316   11/23/18 2030  ceFEPIme (MAXIPIME) 2 g in sodium chloride 0.9 % 100 mL IVPB  Status:  Discontinued  2 g 200 mL/hr over 30 Minutes Intravenous Every 8 hours 11/23/18 2027 11/24/18 0746   11/23/18 1830  ceFAZolin (ANCEF) IVPB 1 g/50 mL premix     1 g 100 mL/hr over 30 Minutes Intravenous  Once 11/23/18 1815 11/23/18 1924   11/23/18 1830  clindamycin (CLEOCIN) IVPB 600 mg     600 mg 100 mL/hr over 30 Minutes Intravenous  Once 11/23/18 1815 11/23/18 2002      Assessment/Plan: Mr. sheils is a 85 yo POD 1 s/p 2nd left toe amputation. Doing fair. Wound looking ok. Superficial portion left open.  -Xeroform gauze to the 2nd toe site daily and to the bottom lateral abrasion on the left foot, dry gauze between toes and wrap with kerlix -Cannot find hydrogel in our orders, but he could transition to a hydrogel wound application every three days with xeroform over the wound as an outpatient with  home health RN -Plan to see him in clinic 6/23 if he gets discharged for a wound check     LOS: 3 days    Virl Cagey 11/27/2018

## 2018-11-27 NOTE — Discharge Instructions (Signed)
Wound care instructions: -Xeroform gauze to the 2nd toe site daily and to the bottom lateral abrasion on the left foot, dry gauze between toes and wrap with kerlix -Cannot find hydrogel in our orders, but he could transition to a hydrogel wound application every three days with xeroform over the wound as an outpatient with home health RN

## 2018-11-27 NOTE — Care Management Important Message (Signed)
Important Message  Patient Details  Name: Jon [name] MRN: [id number] Date of Birth: Nov 02, 1931   Medicare Important Message Given:  Yes    Tommy Medal 11/27/2018, 1:24 PM

## 2018-11-27 NOTE — Progress Notes (Signed)
IV removed and discharge instructions reviewed with son, Sherren Mocha by phone.  Scripts sent to pharmacy.  Follow up appts made.

## 2018-11-27 NOTE — TOC Transition Note (Signed)
Transition of Care [facility]) - CM/SW Discharge Note   Patient Details  Name: Jon [name] MRN: [id number] Date of Birth: 1932/03/20  [facility] CM/SW Contact:  Shade Flood, LCSW Phone Number: 11/27/2018, 2:12 PM   Clinical Narrative:     Pt discussed with MD in Progression today. Per MD, pt stable for dc today. Pt will return home with continued HH from [facility]. Updated Santiago Glad from [place]. She states they will see pt this weekend. Updated pt's son. He states pt transports home with EMS. Will message pt's RN.  There are no other TOC needs for dc.  Final next level of care: Home w Home Health Services Barriers to Discharge: Barriers Resolved   Patient Goals and CMS Choice Patient states their goals for this hospitalization and ongoing recovery are:: son would like patient to come home      Discharge Placement                       Discharge Plan and Services   Discharge Planning Services: CM Consult Post Acute Care Choice: Home Health, Resumption of Svcs/PTA Provider                    HH Arranged: RN [facility] Agency: [place] Date [place]: 11/24/18 Time [place]: 0932 Representative spoke with at [place]: [place] ([place]) Interventions     Readmission Risk Interventions No flowsheet data found.

## 2018-11-27 NOTE — Discharge Summary (Signed)
Physician Discharge Summary  Jon CutterWilliam P [name] JXB:[id number] DOB: 1931/08/31 DOA: 11/23/2018  PCP: Gareth MorganKnowlton, Steve, MD  Admit date: 11/23/2018 Discharge date: 11/27/2018  Time spent: 35 minutes  Recommendations for Outpatient Follow-up:  1. Repeat basic metabolic panel to follow electrolytes and renal function 2. Reassess vital signs and patient volume status and further adjust the need of diuretics and antihypertensive regimen.   Discharge Diagnoses:  Principal Problem:   Osteomyelitis of second toe of left foot (HCC) Active Problems:   Lower urinary tract infectious disease   Hyponatremia   PAF (paroxysmal atrial fibrillation) (HCC)   Tachycardia-bradycardia syndrome (HCC)   Chronic diastolic CHF (congestive heart failure) (HCC)   Gangrene of left foot (HCC)   Toe osteomyelitis, left (HCC)   Wet gangrene (HCC)   Skin ulcers of foot, bilateral (HCC)   Discharge Condition: Stable and overall improved.  Patient discharged home with instruction to follow-up with PCP and general surgery as an outpatient.  Home health services has been resumed at time of discharge.  Diet recommendation: Heart healthy diet.  Filed Weights   11/25/18 0500 11/26/18 0500 11/27/18 0500  Weight: 62.7 kg 64.5 kg 63.6 kg    History of present illness:  85 y/o male with a history of paroxysmalatrialfibrillation, tachybradycardia syndrome status post permanent pacemaker 2006, diastolic CHF, hypertension, hyponatremia, sacral decubitus ulcer, chronic indwelling Foley catheter, and dementia presenting with bleeding and discoloration of his left second toe.  The patient was recently admitted to the hospital from 07/28/2018 through 08/02/2018 at which time the patient was treated for a penoscrotal abscess.  The patient finished antibiotics during the hospitalization and was discharged in stable condition.  He was previously in residential hospice in December 2018.  However, the patient was stabilized and was  discharged home.  He has been at home since his period of time.  His son states that he has had a wound in his left second toe for at least a month.  They have been treating it locally at home with wound dressings.  However, in the past 2 to 3 days they noted increasing discoloration and bleeding.  As result, the patient was brought to emergency department for further evaluation.  In the ED, the patient was afebrile with soft blood pressures.  He had oxygen saturation 99% on room air.  WBC was 8.2.  BMP showed a serum sodium 121.  LFTs were unremarkable.  X-ray of his left foot showed severe demineralization of the distal phalanx of the left second toe with probable osteomyelitis.    Hospital Course:  Dry gangrene left second toe/contiguous focus osteomyelitis -Certainly, the patient may have a degree of surrounding cellulitis -Primary issue is likely a peripheral vascular issue. -appreciate General surgery consultation -Status post second left toe amputation on 11/26/2018. -Antibiotics narrowed to doxycycline orally, and plan is to complete 5 more days of antibiotics at time of discharge. -Continue wound care as instructed by general surgery -Outpatient follow-up on June 23 for wound check.  Hyponatremia -Patient appears volume depleted -Due to volume depletion, continue use of diuretics and poor solute intake -Continue IV fluids, encourage adequate oral intake. -Continue holding furosemide  Catheter associated UTI -Patient has chronic indwelling Foley catheter secondary to neurogenic bladder -Foley catheter changed on 11/23/2018 -Patient denies dysuria currently -Urine culture demonstrated more than 100,000 colonies of Pseudomonas. -discharge ciprofloxacin based on culture sensitivity; 5 more days of antibiotics pending at this time to complete therapy..  Tachybradycardia syndrome -Status post permanent pacemaker -Presently in sinus rhythm  and with well-controlled rate  Paroxysmal  atrial fibrillation -CHADS-VASc at least 4 (age x2, CHF, HTN) -On aspirin at discharge. -Not on anticoagulation secondary to prior bleeding history -resume metoprolol at discharge  Chronic diastolic CHF -Appears compensated at discharge -safe to resume home medication regimen. -continue monitoring sodium intake.  Severe protein calorie malnutrition  -Nutrition consultation appreciated. -Will follow recommendation for feeding supplements. -Patient discharged with instruction to use Juven, ensuring pro-stat  Anxiety -continue home dose alprazolam -Mood appropriate and is stable.  Procedures: -See below for x-ray reports -Second left toe amputation 11/26/2018  Consultations:  General surgery  Discharge Exam: Vitals:   11/27/18 0501 11/27/18 1335  BP: 101/61 92/60  Pulse: 68 73  Resp: 16 19  Temp: 98.3 F (36.8 C) 98 F (36.7 C)  SpO2: 100% 100%   General exam: Alert, awake, oriented x 2 ([facility]); in no acute distress and afebrile.  Patient is status post left second toe amputation, so far well-tolerated.  No acute distress. Respiratory system: Clear to auscultation. Respiratory effort normal.   Cardiovascular system:RRR. No murmurs, rubs, gallops. Gastrointestinal system: Abdomen is nondistended, soft and nontender. No organomegaly or masses felt. Normal bowel sounds heard. Central nervous system: Alert and oriented. No focal neurological deficits. Extremities/skin: No edema, left fluid with clean dressings in place after second toe amputation earlier today.  Right lower extremity without open wounds.  No rash, no petechiae.  Stage II decubitus ulcer in his sacral/coccyx area present prior to admission; without any signs of acute superimposed infection. Psychiatry: Mood & affect appropriate.    Discharge Instructions   Discharge Instructions    Discharge instructions   Complete by: As directed    Take medications as prescribed Maintain adequate  hydration Outpatient follow-up with primary care doctor and general surgery Follow general surgery instructions for wound care.     Allergies as of 11/27/2018      Reactions   Iohexol     Desc: PT STATES THROAT/FACE SWELLING W/ IVP DYE IN 1990. PT HAS NOT HAD ANY EXAMS DONE W/DYE SINCE AND HAS NEVER BEEN PRE MEDICATED. PER PT, Onset Date: [id number]   Shellfish Allergy    Dilantin [phenytoin Sodium Extended] Rash   Tegretol [carbamazepine] Rash      Medication List    TAKE these medications   acetaminophen 500 MG tablet Commonly known as: TYLENOL Take 500 mg by mouth every 6 (six) hours as needed for mild pain.   ALPRAZolam 1 MG tablet Commonly known as: XANAX Take 0.5 tablets (0.5 mg total) by mouth 2 (two) times daily as needed for anxiety. What changed: when to take this   ciprofloxacin 500 MG tablet Commonly known as: CIPRO Take 1 tablet (500 mg total) by mouth 2 (two) times daily for 5 days.   docusate sodium 100 MG capsule Commonly known as: COLACE Take 100 mg by mouth daily as needed for mild constipation.   doxycycline 100 MG tablet Commonly known as: VIBRA-TABS Take 1 tablet (100 mg total) by mouth every 12 (twelve) hours for 5 days.   EYE DROPS OP Apply 2 drops to eye daily as needed (irritation).   feeding supplement (PRO-STAT SUGAR FREE 64) Liqd Take 30 mLs by mouth 2 (two) times daily.   HYDROcodone-acetaminophen 5-325 MG tablet Commonly known as: NORCO/VICODIN Take 1 tablet by mouth every 6 (six) hours as needed for up to 5 days for severe pain.   metoprolol tartrate 25 MG tablet Commonly known as: LOPRESSOR Take 0.5 tablets (12.5 mg  total) by mouth 2 (two) times daily.   nutrition supplement (JUVEN) Pack Take 1 packet by mouth 2 (two) times daily between meals. Start taking on: November 28, 2018   feeding supplement (ENSURE ENLIVE) Liqd Take 237 mLs by mouth 2 (two) times daily between meals. Start taking on: November 28, 2018   nystatin  powder Commonly known as: MYCOSTATIN/NYSTOP Apply topically daily as needed (applied to back for yeast).   polyethylene glycol 17 g packet Commonly known as: MIRALAX / GLYCOLAX Take 17 g by mouth daily as needed for mild constipation.      Allergies  Allergen Reactions  . Iohexol      Desc: PT STATES THROAT/FACE SWELLING W/ IVP DYE IN 1990. PT HAS NOT HAD ANY EXAMS DONE W/DYE SINCE AND HAS NEVER BEEN PRE MEDICATED. PER PT, Onset Date: [id number]   . Shellfish Allergy   . Dilantin [Phenytoin Sodium Extended] Rash  . Tegretol [Carbamazepine] Rash   Follow-up Information    Gareth MorganKnowlton, Steve, MD. Schedule an appointment as soon as possible for a visit in 2 week(s).   Specialty: Family Medicine Contact information: [address] [place] [address] [phone]           The results of significant diagnostics from this hospitalization (including imaging, microbiology, ancillary and laboratory) are listed below for reference.    Significant Diagnostic Studies: [place]as Arterial Abi (screening Lower Extremity)  Result Date: 11/24/2018 CLINICAL DATA:  85 year old male with foot ulcers EXAM: NONINVASIVE PHYSIOLOGIC VASCULAR STUDY OF BILATERAL LOWER EXTREMITIES TECHNIQUE: Evaluation of both lower extremities was performed at rest, including calculation of ankle-brachial indices, multiple segmental pressure evaluation, segmental Doppler and segmental pulse volume recording. COMPARISON:  None. FINDINGS: Right: Resting ankle brachial index:  1.56 Segmental blood pressure: Symmetric upper extremity pressures Doppler: Segmental Doppler at the right ankle demonstrates triphasic waveform of the posterior tibial artery and dorsalis pedis. Left: Resting ankle brachial index: Non attainable secondary to noncompression Segmental blood pressure: Symmetric upper extremity pressures Doppler: Segmental Doppler at the left ankle demonstrates biphasic waveform of the posterior tibial artery and dorsalis  pedis. Additional: IMPRESSION: Resting ABI the right lower extremity is elevated, likely secondary to noncompression, with unobtainable ABI on the left. Segmental Doppler at the bilateral lower extremities demonstrates normal waveform on the right and biphasic waveform on the left. Signed, Yvone NeuJaime S. Reyne DumasWagner, DO, RPVI [facility] Electronically Signed   By: Gilmer MorJaime  Wagner D.O.   On: 11/24/2018 08:48   Dg Chest Portable 1 View  Result Date: 11/23/2018 CLINICAL DATA:  85 year old male with history of cellulitis of the left second toes. EXAM: PORTABLE CHEST 1 VIEW COMPARISON:  Chest x-ray 06/03/2017. FINDINGS: Lung volumes are normal. No consolidative airspace disease. No pleural effusions. No pneumothorax. No pulmonary nodule or mass noted. Pulmonary vasculature and the cardiomediastinal silhouette are within normal limits. Atherosclerosis in the thoracic aorta. Left-sided pacemaker device in place with lead tips projecting over the expected location of the right atrium and right ventricle. IMPRESSION: 1.  No radiographic evidence of acute cardiopulmonary disease. 2. Aortic atherosclerosis. Electronically Signed   By: Trudie Reedaniel  Entrikin M.D.   On: 11/23/2018 18:01   Dg Foot Complete Left  Result Date: 11/23/2018 CLINICAL DATA:  Cellulitis of the second toe EXAM: LEFT FOOT - COMPLETE 3+ VIEW COMPARISON:  None. FINDINGS: Markedly severe bony demineralization, with hyperflexion of the MTP joints of the second through fifth digits leading to bony overlap and very poor definition of the phalanges. Difficult  standard positioning of the foot. No obvious dislocation at the Lisfranc joint. We were not able to obtain a true lateral projection. Degenerative midfoot findings. In the second toe there is severe distal phalangeal bony demineralization probably from osteomyelitis. Poor definition of the middle phalanx. Suspected fracture of the distal metaphysis and head of  the proximal phalanx. Other phalanges including the fourth and fifth toe distal phalanges are highly indistinct. IMPRESSION: 1. Severe demineralization of the distal phalanx second toe probably from osteomyelitis. 2. Probable fracture of the distal metaphysis and head of the proximal phalanx second toe. Poor visualization of the distal phalanges of the fourth and fifth toes, significance uncertain. 3. Severe osteoporosis/bony demineralization. 4. Foot deformities make standard projections problematic. We were unable to obtain standard views. Electronically Signed   By: Gaylyn RongWalter  Liebkemann M.D.   On: 11/23/2018 18:04    Microbiology: Recent Results (from the past 240 hour(s))  Culture, blood (routine x 2)     Status: Abnormal   Collection Time: 11/23/18  4:50 PM   Specimen: BLOOD RIGHT ARM  Result Value Ref Range Status   Specimen Description   Final    BLOOD RIGHT ARM Performed at [name], [address] [place], [place] [address]    Special Requests   Final    BOTTLES DRAWN AEROBIC AND ANAEROBIC Blood Culture adequate volume Performed at [name], [address] [place], [place] [address]    Culture  Setup Time   Final    GRAM POSITIVE COCCI AEROBIC BOTTLE ONLY Gram Stain Report Called to,Read Back By and Verified With: JONES J. AT 1405 BY THOMPSON S. CRITICAL RESULT CALLED TO, READ BACK BY AND VERIFIED WITH: T HANDY RN 1937 11/24/18 A BROWNING    Culture (A)  Final    STAPHYLOCOCCUS SPECIES (COAGULASE NEGATIVE) THE SIGNIFICANCE OF ISOLATING THIS ORGANISM FROM A SINGLE SET OF BLOOD CULTURES WHEN MULTIPLE SETS ARE DRAWN IS UNCERTAIN. PLEASE NOTIFY THE MICROBIOLOGY DEPARTMENT WITHIN ONE WEEK IF SPECIATION AND SENSITIVITIES ARE REQUIRED. Performed at [facility], [address] [place], [place] [address]    Report Status 11/25/2018 FINAL  Final  Blood Culture ID Panel (Reflexed)     Status: Abnormal   Collection Time: 11/23/18  4:50 PM  Result Value Ref Range Status    Enterococcus species NOT DETECTED NOT DETECTED Final   Listeria monocytogenes NOT DETECTED NOT DETECTED Final   Staphylococcus species DETECTED (A) NOT DETECTED Final    Comment: Methicillin (oxacillin) resistant coagulase negative staphylococcus. Possible blood culture contaminant (unless isolated from more than one blood culture draw or clinical case suggests pathogenicity). No antibiotic treatment is indicated for blood  culture contaminants. CRITICAL RESULT CALLED TO, READ BACK BY AND VERIFIED WITH: T HANDY RN 1937 11/24/18 A BROWNING    Staphylococcus aureus (BCID) NOT DETECTED NOT DETECTED Final   Methicillin resistance DETECTED (A) NOT DETECTED Final    Comment: CRITICAL RESULT CALLED TO, READ BACK BY AND VERIFIED WITH: T HANDY RN 1937 11/24/18 A BROWNING    Streptococcus species NOT DETECTED NOT DETECTED Final   Streptococcus agalactiae NOT DETECTED NOT DETECTED Final   Streptococcus pneumoniae NOT DETECTED NOT DETECTED Final   Streptococcus pyogenes NOT DETECTED NOT DETECTED Final   Acinetobacter baumannii NOT DETECTED NOT DETECTED Final   Enterobacteriaceae species NOT DETECTED NOT DETECTED Final   Enterobacter cloacae complex NOT DETECTED NOT DETECTED Final   Escherichia coli NOT DETECTED NOT DETECTED Final   Klebsiella oxytoca NOT DETECTED NOT DETECTED Final  Klebsiella pneumoniae NOT DETECTED NOT DETECTED Final   Proteus species NOT DETECTED NOT DETECTED Final   Serratia marcescens NOT DETECTED NOT DETECTED Final   Haemophilus influenzae NOT DETECTED NOT DETECTED Final   Neisseria meningitidis NOT DETECTED NOT DETECTED Final   Pseudomonas aeruginosa NOT DETECTED NOT DETECTED Final   Candida albicans NOT DETECTED NOT DETECTED Final   Candida glabrata NOT DETECTED NOT DETECTED Final   Candida krusei NOT DETECTED NOT DETECTED Final   Candida parapsilosis NOT DETECTED NOT DETECTED Final   Candida tropicalis NOT DETECTED NOT DETECTED Final    Comment: Performed at [facility], [address], [place], [place] [address]  Culture, blood (routine x 2)     Status: None (Preliminary result)   Collection Time: 11/23/18  4:59 PM   Specimen: BLOOD LEFT WRIST  Result Value Ref Range Status   Specimen Description BLOOD LEFT WRIST  Final   Special Requests   Final    BOTTLES DRAWN AEROBIC AND ANAEROBIC Blood Culture adequate volume   Culture   Final    NO GROWTH 4 DAYS Performed at [facility], [address], [place], [place] [address]    Report Status PENDING  Incomplete  SARS Coronavirus 2 (CEPHEID - Performed in [facility] hospital lab), Hosp Order     Status: None   Collection Time: 11/23/18  5:27 PM   Specimen: Nasopharyngeal Swab  Result Value Ref Range Status   SARS Coronavirus 2 NEGATIVE NEGATIVE Final    Comment: (NOTE) If result is NEGATIVE SARS-CoV-2 target nucleic acids are NOT DETECTED. The SARS-CoV-2 RNA is generally detectable in upper and lower  respiratory specimens during the acute phase of infection. The lowest  concentration of SARS-CoV-2 viral copies this assay can detect is 250  copies / mL. A negative result does not preclude SARS-CoV-2 infection  and should not be used as the sole basis for treatment or other  patient management decisions.  A negative result may occur with  improper specimen collection / handling, submission of specimen other  than nasopharyngeal swab, presence of viral mutation(s) within the  areas targeted by this assay, and inadequate number of viral copies  (<250 copies / mL). A negative result must be combined with clinical  observations, patient history, and epidemiological information. If result is POSITIVE SARS-CoV-2 target nucleic acids are DETECTED. The SARS-CoV-2 RNA is generally detectable in upper and lower  respiratory specimens dur ing the acute phase of infection.  Positive  results are indicative of active infection with SARS-CoV-2.  Clinical  correlation with patient history and other  diagnostic information is  necessary to determine patient infection status.  Positive results do  not rule out bacterial infection or co-infection with other viruses. If result is PRESUMPTIVE POSTIVE SARS-CoV-2 nucleic acids MAY BE PRESENT.   A presumptive positive result was obtained on the submitted specimen  and confirmed on repeat testing.  While 2019 novel coronavirus  (SARS-CoV-2) nucleic acids may be present in the submitted sample  additional confirmatory testing may be necessary for epidemiological  and / or clinical management purposes  to differentiate between  SARS-CoV-2 and other Sarbecovirus currently known to infect humans.  If clinically indicated additional testing with an alternate test  methodology [phone]) is advised. The SARS-CoV-2 RNA is generally  detectable in upper and lower respiratory sp ecimens during the acute  phase of infection. The expected result is Negative. Fact Sheet for Patients:  BoilerBrush.com.cy Fact Sheet for Healthcare Providers: https://pope.com/ This test  is not yet approved or cleared by the [place] and has been authorized for detection and/or diagnosis of SARS-CoV-2 by FDA under an Emergency Use Authorization (EUA).  This EUA will remain in effect (meaning this test can be used) for the duration of the COVID-19 declaration under Section 564(b)(1) of the Act, 21 U.S.C. section 360bbb-3(b)(1), unless the authorization is terminated or revoked sooner. Performed at [facility], [address], [place], [place] [address]   Urine Culture     Status: Abnormal   Collection Time: 11/23/18  5:32 PM   Specimen: Urine, Catheterized  Result Value Ref Range Status   Specimen Description   Final    URINE, CATHETERIZED Performed at [facility], [address] [place], [place] [address]    Special Requests   Final    NONE Performed at [facility], [address] [place], [place]  [address]    Culture >=100,000 COLONIES/mL PSEUDOMONAS AERUGINOSA (A)  Final   Report Status 11/26/2018 FINAL  Final   Organism ID, Bacteria PSEUDOMONAS AERUGINOSA (A)  Final      Susceptibility   Pseudomonas aeruginosa - MIC*    CEFTAZIDIME 4 SENSITIVE Sensitive     CIPROFLOXACIN <=0.25 SENSITIVE Sensitive     GENTAMICIN <=1 SENSITIVE Sensitive     IMIPENEM <=0.25 SENSITIVE Sensitive     PIP/TAZO 8 SENSITIVE Sensitive     CEFEPIME 2 SENSITIVE Sensitive     * >=100,000 COLONIES/mL PSEUDOMONAS AERUGINOSA  Surgical pcr screen     Status: None   Collection Time: 11/25/18 10:21 PM   Specimen: Nasal Mucosa; Nasal Swab  Result Value Ref Range Status   MRSA, PCR NEGATIVE NEGATIVE Final   Staphylococcus aureus NEGATIVE NEGATIVE Final    Comment: (NOTE) The Xpert SA Assay (FDA approved for NASAL specimens in patients 43 years of age and older), is one component of a comprehensive surveillance program. It is not intended to diagnose infection nor to guide or monitor treatment. Performed at [facility], [address], [place], [place] [address]      Labs: Basic Metabolic Panel: Recent Labs  Lab 11/23/18 1650 11/24/18 0006 11/24/18 0836 11/24/18 1550 11/25/18 0518  NA 121* 123* 126* 127* 128*  K 4.7 4.5 4.4 4.3 4.5  CL 86* 91* 94* 96* 100  CO2 24 19* 22 20* 20*  GLUCOSE 104* 103* 93 90 100*  BUN 20 18 17 16 23   CREATININE 0.68 0.63 0.66 0.63 0.77  CALCIUM 8.6* 8.5* 8.2* 7.9* 8.0*   Liver Function Tests: Recent Labs  Lab 11/23/18 1650  AST 17  ALT 10  ALKPHOS 80  BILITOT 0.9  PROT 6.9  ALBUMIN 3.3*   CBC: Recent Labs  Lab 11/23/18 1650 11/24/18 0006 11/25/18 0518  WBC 8.2 8.0 7.0  NEUTROABS 6.2 6.9  --   HGB 12.6* 13.0 10.7*  HCT 38.6* 40.3 32.8*  MCV 80.4 82.4 82.2  PLT 255 241 233   CBG: Recent Labs  Lab 11/25/18 0720 11/26/18 0909 11/26/18 1117 11/27/18 0718  GLUCAP 98 94 94 89    Signed:  Barton Dubois MD.  [facility] 11/27/2018, 3:03  PM

## 2018-11-28 LAB — CULTURE, BLOOD (ROUTINE X 2)
Culture: NO GROWTH
Special Requests: ADEQUATE

## 2019-05-18 DEATH — deceased
